# Patient Record
Sex: Female | Born: 1980
Health system: Southern US, Community
[De-identification: ages and names within clinical notes are randomized; demographics above are authoritative.]

## PROBLEM LIST (undated history)

## (undated) DIAGNOSIS — F3289 Other specified depressive episodes: Secondary | ICD-10-CM

## (undated) DIAGNOSIS — Q211 Atrial septal defect, unspecified: Secondary | ICD-10-CM

## (undated) DIAGNOSIS — Z9889 Other specified postprocedural states: Secondary | ICD-10-CM

## (undated) DIAGNOSIS — D649 Anemia, unspecified: Secondary | ICD-10-CM

## (undated) DIAGNOSIS — R112 Nausea with vomiting, unspecified: Secondary | ICD-10-CM

## (undated) DIAGNOSIS — R030 Elevated blood-pressure reading, without diagnosis of hypertension: Secondary | ICD-10-CM

## (undated) DIAGNOSIS — Z973 Presence of spectacles and contact lenses: Secondary | ICD-10-CM

## (undated) DIAGNOSIS — Z8489 Family history of other specified conditions: Secondary | ICD-10-CM

## (undated) DIAGNOSIS — N802 Endometriosis of fallopian tube: Secondary | ICD-10-CM

## (undated) DIAGNOSIS — K589 Irritable bowel syndrome without diarrhea: Secondary | ICD-10-CM

## (undated) DIAGNOSIS — R011 Cardiac murmur, unspecified: Secondary | ICD-10-CM

## (undated) DIAGNOSIS — Z9189 Other specified personal risk factors, not elsewhere classified: Secondary | ICD-10-CM

## (undated) DIAGNOSIS — N80209 Endometriosis of unspecified fallopian tube, unspecified depth: Secondary | ICD-10-CM

## (undated) DIAGNOSIS — Z5189 Encounter for other specified aftercare: Secondary | ICD-10-CM

## (undated) DIAGNOSIS — G002 Streptococcal meningitis: Secondary | ICD-10-CM

## (undated) DIAGNOSIS — F329 Major depressive disorder, single episode, unspecified: Secondary | ICD-10-CM

## (undated) HISTORY — DX: Encounter for other specified aftercare: Z51.89

## (undated) HISTORY — DX: Endometriosis of fallopian tube: N80.2

## (undated) HISTORY — DX: Cardiac murmur, unspecified: R01.1

## (undated) HISTORY — PX: SPINE SURGERY: SHX786

## (undated) HISTORY — PX: DILATION AND CURETTAGE OF UTERUS: SHX78

## (undated) HISTORY — PX: CORONARY ARTERY BYPASS GRAFT: SHX141

## (undated) HISTORY — DX: Major depressive disorder, single episode, unspecified: F32.9

## (undated) HISTORY — DX: Other specified depressive episodes: F32.89

## (undated) HISTORY — DX: Streptococcal meningitis: G00.2

## (undated) HISTORY — DX: Other specified personal risk factors, not elsewhere classified: Z91.89

## (undated) HISTORY — DX: Anemia, unspecified: D64.9

## (undated) HISTORY — DX: Endometriosis of unspecified fallopian tube, unspecified depth: N80.209

## (undated) HISTORY — DX: Elevated blood-pressure reading, without diagnosis of hypertension: R03.0

---
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
## 1999-10-14 HISTORY — PX: LAPAROSCOPIC ENDOMETRIOSIS FULGURATION: SUR769

## 2005-12-08 ENCOUNTER — Encounter: Payer: Self-pay | Admitting: Family Medicine

## 2005-12-08 ENCOUNTER — Ambulatory Visit: Payer: Self-pay | Admitting: Family Medicine

## 2006-12-07 ENCOUNTER — Ambulatory Visit: Payer: Self-pay | Admitting: Family Medicine

## 2007-03-29 ENCOUNTER — Emergency Department: Payer: Self-pay

## 2007-08-05 ENCOUNTER — Ambulatory Visit: Payer: Self-pay | Admitting: Internal Medicine

## 2008-03-09 ENCOUNTER — Ambulatory Visit: Payer: Self-pay | Admitting: Gynecology

## 2008-03-23 ENCOUNTER — Ambulatory Visit (HOSPITAL_COMMUNITY): Admission: RE | Admit: 2008-03-23 | Discharge: 2008-03-23 | Payer: Self-pay | Admitting: Gynecology

## 2008-03-28 ENCOUNTER — Ambulatory Visit: Payer: Self-pay | Admitting: Obstetrics and Gynecology

## 2008-03-28 ENCOUNTER — Encounter: Payer: Self-pay | Admitting: Obstetrics and Gynecology

## 2008-04-24 ENCOUNTER — Encounter: Payer: Self-pay | Admitting: Obstetrics & Gynecology

## 2008-04-24 ENCOUNTER — Ambulatory Visit (HOSPITAL_COMMUNITY): Admission: RE | Admit: 2008-04-24 | Discharge: 2008-04-24 | Payer: Self-pay | Admitting: Obstetrics & Gynecology

## 2008-04-24 ENCOUNTER — Ambulatory Visit: Payer: Self-pay | Admitting: Family Medicine

## 2008-04-24 ENCOUNTER — Ambulatory Visit: Payer: Self-pay | Admitting: Obstetrics & Gynecology

## 2010-02-08 ENCOUNTER — Emergency Department: Payer: Self-pay | Admitting: Emergency Medicine

## 2010-02-19 ENCOUNTER — Ambulatory Visit: Payer: Self-pay | Admitting: Obstetrics & Gynecology

## 2010-02-26 ENCOUNTER — Emergency Department (HOSPITAL_COMMUNITY): Admission: EM | Admit: 2010-02-26 | Discharge: 2010-02-26 | Payer: Self-pay | Admitting: Emergency Medicine

## 2010-02-26 ENCOUNTER — Ambulatory Visit (HOSPITAL_COMMUNITY): Admission: RE | Admit: 2010-02-26 | Discharge: 2010-02-26 | Payer: Self-pay | Admitting: Obstetrics & Gynecology

## 2010-03-05 ENCOUNTER — Ambulatory Visit: Payer: Self-pay | Admitting: Obstetrics & Gynecology

## 2010-03-05 ENCOUNTER — Encounter (INDEPENDENT_AMBULATORY_CARE_PROVIDER_SITE_OTHER): Payer: Self-pay | Admitting: *Deleted

## 2010-03-05 LAB — CONVERTED CEMR LAB: Prolactin: 10.1 ng/mL

## 2010-03-15 LAB — HM PAP SMEAR

## 2010-04-24 ENCOUNTER — Ambulatory Visit (HOSPITAL_COMMUNITY): Admission: RE | Admit: 2010-04-24 | Discharge: 2010-04-24 | Payer: Self-pay | Admitting: Gastroenterology

## 2010-06-30 ENCOUNTER — Ambulatory Visit
Admission: RE | Admit: 2010-06-30 | Discharge: 2010-06-30 | Payer: Self-pay | Source: Home / Self Care | Attending: Family Medicine | Admitting: Family Medicine

## 2010-06-30 DIAGNOSIS — N809 Endometriosis, unspecified: Secondary | ICD-10-CM | POA: Insufficient documentation

## 2010-06-30 DIAGNOSIS — G039 Meningitis, unspecified: Secondary | ICD-10-CM | POA: Insufficient documentation

## 2010-06-30 DIAGNOSIS — F325 Major depressive disorder, single episode, in full remission: Secondary | ICD-10-CM | POA: Insufficient documentation

## 2010-06-30 DIAGNOSIS — E538 Deficiency of other specified B group vitamins: Secondary | ICD-10-CM | POA: Insufficient documentation

## 2010-06-30 DIAGNOSIS — Z8619 Personal history of other infectious and parasitic diseases: Secondary | ICD-10-CM | POA: Insufficient documentation

## 2010-06-30 DIAGNOSIS — J020 Streptococcal pharyngitis: Secondary | ICD-10-CM | POA: Insufficient documentation

## 2010-06-30 DIAGNOSIS — Z862 Personal history of diseases of the blood and blood-forming organs and certain disorders involving the immune mechanism: Secondary | ICD-10-CM | POA: Insufficient documentation

## 2010-06-30 DIAGNOSIS — Z86010 Personal history of colonic polyps: Secondary | ICD-10-CM | POA: Insufficient documentation

## 2010-06-30 DIAGNOSIS — R011 Cardiac murmur, unspecified: Secondary | ICD-10-CM | POA: Insufficient documentation

## 2010-06-30 DIAGNOSIS — R87619 Unspecified abnormal cytological findings in specimens from cervix uteri: Secondary | ICD-10-CM | POA: Insufficient documentation

## 2010-06-30 LAB — CONVERTED CEMR LAB: Rapid Strep: POSITIVE

## 2010-07-17 NOTE — Assessment & Plan Note (Signed)
Summary: new patient/alc   Vital Signs:  Patient profile:   29 year old female Height:      64.5 inches Weight:      119.25 pounds BMI:     20.23 Temp:     98.8 degrees F oral Pulse rate:   88 / minute Pulse rhythm:   regular BP sitting:   110 / 70  Vitals Entered By: Benny Lennert CMA Duncan Dull) (June 30, 2010 10:06 AM)  History of Present Illness: Chief complaint new patient to be established also with sore throat  Previous PCP...Dr. Thurmond Butts at [facility]...no longer there now. HAs not been back since 2006.  [facility].    Treat at [facility].. for spinal meningitis age 53 months old.  Cardiologist: Dr. Eden Emms... Hx of ASD repair age 48. released to see regular MD in 2004. (Had treadmill stress test nml at that time) No problems since.  In 01/2010.. began with nausea, right abdominal pain...neg CT ab/pelvis...except cyst on ovary and ? spot on uterus. 02/2010 Transvaginal US .Marland Kitchen nml per GYN Continued symptoms so went to Dr. Loreta Ave. Neg celiac disease, nml thyroid.  EGD and colonoscopy.. polyps removed and precancerous 04/2010 HIDA scan: mildly decrease gallbladder function. No wondering if it is her endometriosis.Marland Kitchen symptoms are some better now. Notes pain before eating, although one episode prior to menses.   Acute Visit History:      The patient complains of fever and sore throat.  These symptoms began 6 days ago.  She denies cough, earache, headache, and nausea.  Other comments include: severe ST  Using Halls. .        Her highest temperature has been low grade .        Preventive Screening-Counseling & Management  Alcohol-Tobacco     Smoking Status: never  Caffeine-Diet-Exercise     Does Patient Exercise: yes      Drug Use:  no.    Allergies (verified): No Known Drug Allergies  Past History:  Past Medical History: Current Problems:  SPINAL MENINGITIS (ICD-320.2) ENDOMETRIOSIS OF FALLOPIAN TUBE (ICD-617.2) BLOOD TRANSFUSION WITHOUT REPORTED  DIAGNOSIS (ICD-V58.2) ELEVATED BLOOD PRESSURE WITHOUT DIAGNOSIS OF HYPERTENSION (ICD-796.2) CARDIAC MURMUR (ICD-785.2) DEPRESSION (ICD-311) CHICKENPOX, HX OF (ICD-V15.9) ANEMIA (ICD-285.9)    Past Surgical History: OPEN HEART SURGERY- ASD REPAIR [id number] LAPRASCOPY-08-1999 AND 05-2075for endometriosis  D/C: misscarriage ENDOSCOPY AND COLONOSCOPY 03/2010-06-1997 Dr. Loreta Ave  Family History: father: CAD age 12s, DM, HTN, high cholesterol mother deceased , non Hodgkin's lymphoma.. met to brain age 56 brother: sister: healthy  Social History: Occupation: Aeronautical engineer at [employer]  1 child: age 65 years old Married Never Smoked Alcohol use-yes, rare social Drug use-no Regular exercise-yes, softball, some walking Diet: moderate, some water, picky eater Occupation:  employed Smoking Status:  never Drug Use:  no Does Patient Exercise:  yes  Review of Systems General:  Denies fatigue. CV:  Denies chest pain or discomfort. Resp:  Denies shortness of breath. GI:  Complains of abdominal pain; denies bloody stools.  Physical Exam  General:  Well-developed,well-nourished,in no acute distress; alert,appropriate and cooperative throughout examination Eyes:  No corneal or conjunctival inflammation noted. EOMI. Perrla. Funduscopic exam benign, without hemorrhages, exudates or papilledema. Vision grossly normal. Ears:  External ear exam shows no significant lesions or deformities.  Otoscopic examination reveals clear canals, tympanic membranes are intact bilaterally without bulging, retraction, inflammation or discharge. Hearing is grossly normal bilaterally. Nose:  External nasal examination shows no deformity or inflammation. Nasal mucosa are pink and moist  without lesions or exudates. Mouth:  pharyngeal erythema and pharyngeal exudate.   Neck:  ant cervical lymph node swelling Lungs:  Normal respiratory effort, chest expands symmetrically. Lungs are clear to auscultation, no  crackles or wheezes. Heart:  Normal rate and regular rhythm. S1 and S2 normal without gallop, murmur, click, rub or other extra sounds. Abdomen:  ttp diffuse, no rebound, no guarding, soft, no hepatomegaly, and no splenomegaly.   Pulses:  R and L posterior tibial pulses are full and equal bilaterally  Extremities:  no edema    Impression & Recommendations:  Problem # 1:  STREPTOCOCCAL PHARYNGITIS (ICD-034.0) Assessment New  Her updated medication list for this problem includes:    Amoxicillin 500 Mg Caps (Amoxicillin) .Marland Kitchen... 2 tab by mouth two times a day x 10 days  Instructed to complete antibiotics and call if not improved in 48 hours.   Problem # 2:  ENDOMETRIOSIS (ICD-617.9) Assessment: Improved At this point most likely cause of abdominal pain. pt plans to follow up with GYN ASAP for abn pap review and further eval for endometriosis.   Complete Medication List: 1)  Amoxicillin 500 Mg Caps (Amoxicillin) .... 2 tab by mouth two times a day x 10 days  Patient Instructions: 1)  B12 1000 micrograms daily. 2)  Take your antibiotic as prescribed until ALL of it is gone, but stop if you develop a rash or swelling and contact our office as soon as possible.  3)  We will get records and review.. We will let you know if anything else needed.  4)  Have GYN send [place] the next OV note. 5)   Call if abdominal pain continuing.. and other MDs have not figured out what is going on. Prescriptions: AMOXICILLIN 500 MG CAPS (AMOXICILLIN) 2 tab by mouth two times a day x 10 days  #40 x 0   Entered and Authorized by:   Kerby Nora MD   Signed by:   Kerby Nora MD on 06/30/2010   Method used:   Print then Give to Patient   RxID:   [id number]    Orders Added: 1)  New Patient Level II [47425]    Prior Medications (reviewed today): None Current Allergies (reviewed today): No known allergies   Laboratory Results    Other Tests  Rapid Strep: positive  Kit Test Internal QC: Negative    (Normal Range: Negative)  PAP Result Date:  03/15/2010 PAP Result:  abnormal pap PAP Next Due:  3 mo

## 2010-08-13 ENCOUNTER — Encounter: Payer: BC Managed Care – PPO | Admitting: Obstetrics & Gynecology

## 2010-08-13 DIAGNOSIS — R87610 Atypical squamous cells of undetermined significance on cytologic smear of cervix (ASC-US): Secondary | ICD-10-CM

## 2010-08-17 ENCOUNTER — Emergency Department: Payer: Self-pay | Admitting: Emergency Medicine

## 2010-08-28 LAB — COMPREHENSIVE METABOLIC PANEL
Creatinine, Ser: 0.84 mg/dL (ref 0.4–1.2)
GFR calc Af Amer: >60 mL/min (ref >60)
GFR calc non Af Amer: >60 mL/min (ref >60)
Glucose, Bld: 84 mg/dL (ref 70–99)
Potassium: 3.6 mEq/L (ref 3.5–5.1)
Total Bilirubin: 0.8 mg/dL (ref 0.3–1.2)

## 2010-08-28 LAB — DIFFERENTIAL
Basophils Absolute: 0 10*3/uL (ref 0.0–0.1)
Eosinophils Relative: 0 % (ref 0–5)
Lymphocytes Relative: 48 % — ABNORMAL HIGH (ref 12–46)
Lymphs Abs: 2.2 10*3/uL (ref 0.7–4.0)
Monocytes Absolute: 0.4 10*3/uL (ref 0.1–1.0)

## 2010-08-28 LAB — URINE MICROSCOPIC-ADD ON

## 2010-08-28 LAB — URINALYSIS, ROUTINE W REFLEX MICROSCOPIC
Hgb urine dipstick: NEGATIVE
Ketones, ur: 15 mg/dL — AB
Leukocytes, UA: NEGATIVE
Nitrite: NEGATIVE
Protein, ur: 30 mg/dL — AB

## 2010-08-28 LAB — CBC
HCT: 33.7 % — ABNORMAL LOW (ref 36.0–46.0)
Hemoglobin: 11.5 g/dL — ABNORMAL LOW (ref 12.0–15.0)
Platelets: 187 10*3/uL (ref 150–400)
WBC: 4.5 10*3/uL (ref 4.0–10.5)

## 2010-08-28 LAB — LIPASE, BLOOD: Lipase: 27 U/L (ref 11–59)

## 2010-08-28 LAB — URINE CULTURE: Culture: NO GROWTH

## 2010-09-16 ENCOUNTER — Ambulatory Visit: Payer: BC Managed Care – PPO | Admitting: Obstetrics and Gynecology

## 2010-09-16 DIAGNOSIS — N949 Unspecified condition associated with female genital organs and menstrual cycle: Secondary | ICD-10-CM

## 2010-09-17 NOTE — Assessment & Plan Note (Signed)
NAME:  Deborah Navarro, Deborah Navarro NO.:  [id number]  MEDICAL RECORD NO.:  [id number]           PATIENT TYPE:  LOCATION:  [facility]           FACILITY:  PHYSICIAN:  Tinnie Gens, MD        DATE OF BIRTH:  12/26/1980  DATE OF SERVICE:  09/16/2010                                 CLINIC NOTE  CHIEF COMPLAINT:  Abdominal pain.  HISTORY OF PRESENT ILLNESS:  The patient is a 30 year old gravida 2, para 1-0-1-1 who comes in today for followup pelvic pain.  The patient has history of endometriosis diagnosed by laparoscopy in 2001.  She underwent presacral neurectomy at that time but no other real treatment. She had a baby in 2002 and she was on Tri-Sprintec for a long time.  She discontinued that in 2009 less than 2-1/2 years of her Tri-Sprintec, actively achieve pregnancy.  She did get pregnant and had miscarriage in November 2009.  She has not gotten pregnant again since then and is not using anything for birth control.  The patient reports mid abdominal pain on the right side off to the right of her umbilicus and somewhat inferior, not true pelvic pain.  She denies specific pain with intercourse but does feel like in certain positions she has painful intercourse and feels like her husband is hitting something.  She does have what she thinks as a incisional hernia on the side and has been feeling that for some time.  The patient has been seen in this office twice for the same problem and was referred to GI as well.  As per GI doctor, she underwent colonoscopy which revealed a precancerous polyp but otherwise they could not find a reason for her pain.  She has had pelvic sonography which was normal.  She has had a CT in [facility] which showed a 2-cm cyst on one of her ovaries which is likely to be a follicle.    Exam: Vitals: are as noted in the chart.  Gen: She is a thin female in no acute distress.   Abdomen: does not really have point tenderness anywhere.   She states  that there is swelling when she stands, so she was examined in the supine and upright position.  She does have what feels like a hernia but I do not feel any mass there.  There is definitely no tenderness there.  If there is any bowel there, it is easily reducible.   Pelvic: there is no significant uterine tenderness or abnormality.  Her ovaries are not well palpated, certainly not enlarged, and not really tender either.  IMPRESSION:  I am unclear as to the etiology of her pain.  I am willing to do a diagnostic laparoscopy and chromopertubation to see if we can figure out what this pain is, also see if there is evidence of a hernia laparoscopically for endometriosis and the scar.  I do not really think that is what this patient has given the lack of point tenderness in this area and lack of mass effect.  If all this comes back negative, would consider general surgery referral.  She is to get her hernia fixed.  She could have spigelian or different type of hernia, although this was  not commented on her CT from August of last year.  We will get this scheduled as soon as we can.          ______________________________ Tinnie Gens, MD    TP/MEDQ  D:  09/16/2010  T:  09/17/2010  Job:  [id number]

## 2010-10-27 ENCOUNTER — Other Ambulatory Visit: Payer: Self-pay | Admitting: Family Medicine

## 2010-10-27 ENCOUNTER — Encounter (HOSPITAL_COMMUNITY): Payer: BC Managed Care – PPO

## 2010-10-27 LAB — SURGICAL PCR SCREEN: Staphylococcus aureus: POSITIVE — AB

## 2010-10-27 LAB — CBC
MCH: 30 pg (ref 26.0–34.0)
MCHC: 33.1 g/dL (ref 30.0–36.0)
MCV: 90.5 fL (ref 78.0–100.0)

## 2010-10-28 NOTE — Assessment & Plan Note (Signed)
NAME:  Deborah Navarro, Deborah Navarro NO.:  [id number]   MEDICAL RECORD NO.:  [id number]          PATIENT TYPE:  POB   LOCATION:  [facility] at [facility]         FACILITY:  [facility]   PHYSICIAN:  Scheryl Darter, MD       DATE OF BIRTH:  [birth year]   DATE OF SERVICE:  03/05/2010                                  CLINIC NOTE   HISTORY OF PRESENT ILLNESS:  The patient returns today for followup for  abdominal and pelvic pain.  She is scheduled for yearly exam.  The  patient is a 30 year old white female, gravida 2, para 1-1-0-1, last  menstrual period February 23, 2010, who was seen on September 7, by Dr.  Macon Large.  She is having more abdominal pelvic pain, more on the right  side.  She had been on trice Sprintec, but she had been off oral  contraceptives for about 2 years.  The patient had a miscarriage in  November 2009.  She had a visit to [facility] for pain.  Dr. Macon Large, prescribed Percocet.  She says her pain was somewhat  better.  She notes that she also has chronic constipation and sometimes  will go for a week without normal bowel movement.  She also says she has  had decreased appetite for several weeks now.  No nausea and vomiting.   PAST MEDICAL HISTORY:  Endometriosis.   PAST SURGICAL HISTORY:  Laparoscopy in 2001 by Dr. Mia Creek.  Dr.  Mia Creek says this showed a great stage II endometriosis.  A month  later, did presacral neurectomy and uterine suspension procedure.  Suction, D and C in November 2009 for miscarriage.  Open-heart surgery  to repair ASD.   MEDICATIONS:  1. Percocet 5/325 mg 1-2 p.o. q.4-6 h. p.r.n. pain.  2. Reglan 10 mg 1 p.o. p.r.n.  3. Macrobid 100 mg 1 p.o. b.i.d. for 7 days which she was completed.   ALLERGIES:  NO KNOWN DRUG ALLERGIES.   SOCIAL HISTORY:  The patient is married.  She denies alcohol, tobacco,  or drug use.   FAMILY HISTORY:  Diabetes, hypertension, coronary artery disease,  stroke, and brain cancer in her  mother.   REVIEW OF SYSTEMS:  The patient had some recent weight loss.  She says  she has decreased appetite.  She has constipation as noted.  No allergy.  No fever.  Abdominal pain can be in the right lower quadrant or an upper  quadrant as well.  She denies any blood in her stool.  The patient has  bilateral nipple discharge since her last pregnancy.   PHYSICAL EXAMINATION:  GENERAL:  No acute distress.  Normal affect.  VITAL SIGNS:  Weight 121 pounds, blood pressure 119/66, and pulse 78.  BREASTS:  Symmetric.  No masses.  Small amount of slightly cloudy nipple  discharge is noted.  ABDOMEN:  Soft, nontender, nondistended.  No mass.  EXTREMITIES:  No swelling or deformity.  PELVIC/EXTERNAL GENITALIA:  Vagina and cervix normal.  Pap was done.  Uterus is normal size.  No adnexal masses or tenderness.   IMPRESSION:  The patient was diagnosed with endometriosis in the past.  This was not confirmed on pathology from this tissue specimen as  laparotomy.  She had chronic constipation.  She noticed some bilateral  nipple discharge.   PLAN:  She states that she is scheduled to see a gastroenterologist in  the next few weeks.  She should keep that appointment.  We will order a  prolactin level today because of the nipple discharge.  States that she  recently had a TSH level and try to get that report.  I notify her  results for Pap and her prolactin level.   RECOMMEND:  She will follow up here as needed after she sees a  gastroenterologist.      Scheryl Darter, MD     JA/MEDQ  D:  03/05/2010  T:  03/06/2010  Job:  [id number]

## 2010-10-28 NOTE — Assessment & Plan Note (Signed)
NAME:  Deborah Navarro, ROBERSON NO.:  [id number]   MEDICAL RECORD NO.:  [id number]          PATIENT TYPE:  POB   LOCATION:  [facility]         FACILITY:  [facility]   PHYSICIAN:  Jaynie Collins, MD     DATE OF BIRTH:  Apr 22, 1981   DATE OF SERVICE:  02/19/2010                                  CLINIC NOTE   CHIEF COMPLAINT:  Ovarian cyst and pelvic pain.   HISTORY OF PRESENT ILLNESS:  The patient is a 30 year old gravida 2,  para 2 who has a history of endometriosis and has been followed in this  clinic for several years who comes in today after she was seen at  [facility] on February 08, 2010, and diagnosed with  an ovarian cyst.  The patient is complaining of having more generalized  pain lately especially on the right side and is concerned that her  endometriosis is coming back.  She was on Tri-Sprintec for a long time,  but has discontinued this lately because she is trying to conceive  again.  At her visit at [facility], she was given ibuprofen for her pain,  but is saying that this is not helping with the amount of pain she is  having.  She is concerned that this ovarian cyst that was seen is  possible evidence of her endometriosis returning.  She denies any  abnormal bleeding or any other concerns.   Her records from [facility] showed that a CT scan  was done, which showed small cyst on bilateral ovaries, largest  measuring 2 cm in diameter and then around that area of heterogeneity in  the uterine fundus which measures 2.7 cm in diameter likely representing  a fibroid.  Normal appendix.   PHYSICAL EXAMINATION:  VITAL SIGNS:  Blood pressure is 123/81, pulse is  67, weight is 125 pounds.  GENERAL:  No apparent distress.  ABDOMEN:  Soft, the patient does have some tenderness in the right lower  quadrant and also right upper quadrant and right flank.  No abnormal  masses were palpated.  Small uterus which is mobile.  No  uterine  tenderness.  The patient had no rebound or guarding during examination.   The patient is a 30 year old gravida 2, para 2 here for evaluation of  pelvic pain and ovarian cyst that was seen on recent imaging.  Given  that a CT scan was done and there are no characteristics of the cyst  that were visualized.  We will obtain a pelvic ultrasound for further  characterization and follow up on the results.  Given her history of  endometriosis and uncontrolled pain at this point, she was given  Percocet prescription 60 tablets were given to the patient to use for  severe pain.  We will follow up her response.  She was told to follow up  after her ultrasound for further discussion of management of her  endometriosis and she was also told that she would have her annual  examination at the same time when she comes back to follow up the  results given that her last Pap smear was in October of 2009.  ______________________________  Jaynie Collins, MD     UA/MEDQ  D:  02/19/2010  T:  02/19/2010  Job:  [id number]

## 2010-10-28 NOTE — Assessment & Plan Note (Signed)
NAME:  Deborah Navarro, Deborah Navarro NO.:  [id number]   MEDICAL RECORD NO.:  [id number]          PATIENT TYPE:  POB   LOCATION:  [facility] at [facility]         FACILITY:  [facility]   PHYSICIAN:  Tinnie Gens, MD        DATE OF BIRTH:  [birth year]   DATE OF SERVICE:  12/07/2006                                  CLINIC NOTE   HISTORY OF PRESENT ILLNESS:  Ms. Hornbeck is a 30 year old white  female, gravida 1, para 1, who presents today for her annual exam.  She  has no complaints today. She is current taking Tri Sprintec and is happy  with that for now.  However, she does plan a pregnancy within this year  and wants to stay on this until they decide exactly when that will be.   PAST MEDICAL HISTORY:  Significant for endometriosis and ASD that was  repaired.   PAST SURGICAL HISTORY:  Significant for open heart surgery for ASD  repair at age 52.  She underwent laparoscopy, laparotomy, and uterine  suspension for endometriosis.   CURRENT MEDICATIONS:  Tri Sprintec.   No known allergies.   GYN HISTORY:  No history of STDs.  She does have a history of one  abnormal Pap with colposcopy and biopsy with all followups being  negative.  Menstrual cycles are every 28 days, lasting 3-4 days without  dysmenorrhea.   OBSTETRICAL HISTORY:  Gravida 1, para 1, with one spontaneous vaginal  delivery.  She does wish another pregnancy.   SOCIAL HISTORY:  Denies the use of tobacco, alcohol, or recreational  drugs.   FAMILY HISTORY:  Significant for strokes, coronary artery disease,  hypertension, diabetes, brain cancer.  Her mother died of brain cancer  at age 19.  Her father is undergoing evaluation now for skin cancer, as  is her sister.   REVIEW OF SYSTEMS:  A 14-point review of systems is negative.   She denies allergies to food or medicines.   The first day of her last period was November 23, 2006.   PHYSICAL EXAMINATION:  VITAL SIGNS:  Pulse 75, blood pressure 132/72,  weight 128.   Height 5 feet 4.  GENERAL:  She is a well-developed and well-nourished female in no acute  distress.  HEART:  Regular without murmur, gallop, or cardiac enlargement.  LUNGS:  Bilaterally clear.  NECK:  Supple without thyroid enlargement.  ABDOMEN:  Soft without masses or organomegaly.  Nontender.  PELVIC:  External genitalia within normal limits for female.  The vagina  is clean and rugose, and the cervix is parous and clean without  tenderness.  The uterus is small without tenderness.  Adnexa are  bilaterally clear.  BREASTS:  Without masses, nodes, or nipple discharge.  EXTREMITIES:  Within normal limits.   IMPRESSION:  Annual examination.   PLAN:  1. We will continue her Tri Sprintec until she is ready to conceive.      A prescription was written for one pack with p.r.n. refills.  2. She is to be on prenatal vitamins at the time she discontinues her      birth control pills.  3. A Pap smear was taken.  The results will be sent  to the patient.  4. She is to return to the office for prenatal care when indicated.     ______________________________  Matt Holmes, N.P.    ______________________________  Tinnie Gens, MD    EMK/MEDQ  D:  12/07/2006  T:  12/07/2006  Job:  [id number]

## 2010-10-28 NOTE — Op Note (Signed)
NAME:  Deborah Navarro, Deborah Navarro NO.:  [id number]   MEDICAL RECORD NO.:  [id number]         PATIENT TYPE:  WAMB   LOCATION:                                FACILITY:  [facility]   PHYSICIAN:  Allie Bossier, MD        DATE OF BIRTH:  [birth year]   DATE OF PROCEDURE:  DATE OF DISCHARGE:                               OPERATIVE REPORT   PREOPERATIVE DIAGNOSIS:  Missed abortion.   POSTOPERATIVE DIAGNOSIS:  Missed abortion.   PROCEDURE:  Section, D&C.   SURGEON:  Allie Bossier, MD   ANESTHESIA:  MAC.   COMPLICATIONS:  None.   ESTIMATED BLOOD LOSS:  Minimal.   SPECIMENS:  Uterine curettings.   DETAIL OF PROCEDURE AND FINDINGS:  The risks, benefits, alternatives of  surgery were explained, understood and accepted.  Consents were signed.  She declines watchful waiting and Cytotec at this time.  In the  operating room she was placed in dorsal lithotomy position and MAC  anesthesia was applied.  Her vagina was prepped and draped in usual  sterile fashion.  Bimanual exam revealed a 6-week size anteverted mobile  uterus and nonenlarged adnexa.  A speculum was placed.  The anterior lip  of the cervix was grasped with single-tooth tenaculum and a total of 20  mL of 1% lidocaine was used for paracervical block.  The cervix was  easily dilated with Shawnie Pons dilators to accommodate a #8 curved suction  curette.  Suction was applied.  The uterus was emptied of its contents.  Sharp curettage in all quadrants and the fundus of the uterus confirmed  complete emptying.  The tenaculum was removed.  No bleeding was noted  from the site.  She was taken to recovery room in stable condition with  the instrument, sponge, and needle counts correct.      Allie Bossier, MD  Electronically Signed     MCD/MEDQ  D:  04/24/2008  T:  04/25/2008  Job:  [id number]

## 2010-10-31 NOTE — Group Therapy Note (Signed)
NAME:  Deborah Navarro, LEMAR NO.:  [id number]   MEDICAL RECORD NO.:  [id number]          PATIENT TYPE:  POB   LOCATION:  [facility]                   FACILITY:  [facility]   PHYSICIAN:  Tinnie Gens, MD        DATE OF BIRTH:  14-Jan-1981   DATE OF SERVICE:  12/08/2005                                    CLINIC NOTE   CHIEF COMPLAINT:  A physical exam.   HISTORY OF PRESENT ILLNESS:  The patient is a 30 year old, gravida 1, para  1, who is here today for a yearly exam.  She has no complaints except for  vaginal discharge that is white with odor over the last month.  The patient  was not experiencing vaginal irritation and notes she has heavy periods at  times.  The patient is currently on Tri-Sprintec and that seems to be  working well for her.   PAST MEDICAL HISTORY:  Negative.   PAST SURGICAL HISTORY:  She had open heart surgery to repair ASD.  She  underwent laparoscopy and then a laparotomy and a sacral neurectomy and  uterine suspension for endometriosis.   MEDICATIONS:  She is on Tri-Sprintec daily.   ALLERGIES:  None known.   OBSTETRICAL HISTORY:  G1, P1 with one vaginal delivery.   GYN HISTORY:  No history of STD.  Does have history of one abnormal Pap and  colposcopy biopsy and then normal Paps for the last four and a half years.   SOCIAL HISTORY:  No tobacco, alcohol or drug use.   FAMILY HISTORY:  Significant for diabetes, hypertension, coronary artery  disease, stroke and brain cancer in her mother.  Mother is deceased of brain  cancer at age 39.   REVIEW OF SYSTEMS:  A 14-point review of systems is reviewed and is negative  except as in HPI with some vaginal discharge.  The patient does complain of  some shortness of breath and chest pain on occasions,  mostly several years  ago.  She does have a cardiologist who she sees and who follows her for  this.  She has undergone stress testing and the final results have been a  firm diagnosis for her.   PHYSICAL EXAMINATION:  VITAL SIGNS:  Her vitals are as noted on the chart.  GENERAL:  She is a well-developed, well-nourished female in no acute  distress.  LUNGS:  Clear bilaterally.  CARDIOVASCULAR:  Regular rate and rhythm.  No rubs, gallops, murmurs.  NECK:  Supple.  Normal thyroid.  ABDOMEN:  Soft, nontender, and nondistended.  No organomegaly.  EXTREMITIES:  No clubbing, cyanosis, or edema.  Had 2+ distal pulses.  GU:  Normal external female genitalia.  The vagina is pink and rugated.  The  cervix is parous and without lesions.  There is a white discharge noted.  BUS is normal.  Uterus is small and anteverted.  Adnexa without masses or  tenderness.  BREASTS:  Symmetric with red nipples.  There was no supraclavicular,  axillary adenopathy.  There was no breast masses.   IMPRESSION:  Yearly exam.   PLAN:  1.  Tri-Sprintec.  2.  GC and Chlamydia  testing.  3.  Wet prep sent.           ______________________________  Tinnie Gens, MD     TP/MEDQ  D:  12/08/2005  T:  12/08/2005  Job:  [id number]

## 2010-11-03 ENCOUNTER — Ambulatory Visit (HOSPITAL_COMMUNITY)
Admission: RE | Admit: 2010-11-03 | Discharge: 2010-11-03 | Disposition: A | Discharge status: Home / Self Care | Payer: BC Managed Care – PPO | Source: Ambulatory Visit | Attending: Family Medicine | Admitting: Family Medicine

## 2010-11-03 DIAGNOSIS — N979 Female infertility, unspecified: Secondary | ICD-10-CM | POA: Insufficient documentation

## 2010-11-03 DIAGNOSIS — Z01812 Encounter for preprocedural laboratory examination: Secondary | ICD-10-CM

## 2010-11-03 DIAGNOSIS — N949 Unspecified condition associated with female genital organs and menstrual cycle: Secondary | ICD-10-CM | POA: Insufficient documentation

## 2010-11-03 DIAGNOSIS — Z01818 Encounter for other preprocedural examination: Secondary | ICD-10-CM | POA: Insufficient documentation

## 2010-11-04 NOTE — Op Note (Signed)
NAME:  Deborah Navarro, Deborah Navarro NO.:  [id number]  MEDICAL RECORD NO.:  [id number]           PATIENT TYPE:  O  LOCATION:  [facility]                          [facility]  PHYSICIAN:  Sydnei Ohaver S. Shawnie Pons, M.D.   DATE OF BIRTH:  04/03/1981  DATE OF PROCEDURE: DATE OF DISCHARGE:                              OPERATIVE REPORT   PREOPERATIVE DIAGNOSIS:  Pelvic pain, infertility.  POSTOPERATIVE DIAGNOSIS:  Pelvic pain, infertility.  PROCEDURES:  Diagnostic laparoscopy, adhesiolysis, and chromopertubation.  SURGEON:  Shelbie Proctor. Shawnie Pons, MD  ASSISTANT:  None.  ANESTHESIA:  General and local, Dr. Arby Barrette  FINDINGS:  Permanent suture adhesions to the anterior abdominal wall related to previous uterine suspension.  A blocked left tube.  A small subcentimeter left ovarian cyst.  No evidence of endometriosis.  Normal- appearing appendix.  Normal-appearing liver edge.  Normal anterior and posterior cul-de-sacs.  Normal-appearing right tube and ovary.  Somewhat dilated left tube.  REASON FOR PROCEDURE:  Briefly, the patient is a 30 year old gravida 2, para 1-0-1-1 who had a miscarriage in November 2009 has not been able to get pregnant since then.  She also has chronic pelvic pain.  She has had a pelvic sonogram, CT of the abdomen and GI referral with colonoscopy which have all failed to determine exactly what was causing her abdominal pain.  Additionally, because she was not able to get pregnant, we are checking the patentcy of her tubes to rule out tubal issues as cause of  her infertility.  PROCEDURE IN DETAILS:  The patient was taken to the OR. She was placed in [place] stirrups.  A time-out was performed.  She was prepped and draped in usual sterile fashion.  SCDs were in place.  She received a gram of Ancef.  A red rubber catheter was used to drain her bladder.  A speculum was placed at the vagina.  The cervix was grasped anteriorly with single-tooth tenaculum.  Acorn cannula  placed through the cervix and this was attached to the single- tooth tenaculum.  The speculum was removed from the vagina.  Attention was then turned to the abdomen.  Marcaine 3 mL of 0.25 were injected about the umbilicus.  A vertical incision was made through the umbilicus to the underlying fascia.  Fascia was divided in the midline sharply. The rectus muscles were separated.  The peritoneum entered sharply; 2-0 Vicryl sutures on the UR-6 were then used to tag either side of the fascia at the umbilicus.  Hasson trocar was placed through this incision.  Upon entry into the abdomen, the patient was placed in Trendelenburg.  The uterus was visualized, lifted up.  The posterior and anterior cul-de-sacs were examined and were felt to be normal.  There was adhesions across a little bit of bowel which were taken down bluntly.  Then permanent suture was noted with scarring of the round ligaments up to the anterior abdominal wall from previous uterine suspension.  The permanent suture on the patient's right was seen easily.  On the left, a portion of it could be seen but it was not nearly as open as the right suture.  Both sides were taken  down without difficulty with monopolar scissors.  The suture was removed on both sides. The ovaries were inspected.  Tubes were inspected and felt to be normal.   Liver edge and appendix were visualized also felt to be normal.   A chromopertubation was then done with spillage from the right tube.   The left tube appeared to be blocked and somewhat dilated.  Excellent  hemostasis was noted. The camera was removed.  Pneumoperitoneum was allowed to be released. The Hasson trocar was removed from the  umbilical incision.  Fascial defect was closed with aforementioned 0 Vicryl sutures on the UR-6 and two figure-of-eight.  Skin was closed  in a subcuticular fashion followed by Dermabond.  All instrument  and lap counts were correct x2.  The patient was later taken to   recovery room in stable condition.     Shelbie Proctor. Shawnie Pons, M.D.     TSP/MEDQ  D:  11/03/2010  T:  11/04/2010  Job:  [id number]  Electronically Signed by Tinnie Gens M.D. on 11/04/2010 08:50:22 AM

## 2010-11-18 ENCOUNTER — Encounter: Payer: BC Managed Care – PPO | Admitting: Family Medicine

## 2010-12-09 ENCOUNTER — Encounter (INDEPENDENT_AMBULATORY_CARE_PROVIDER_SITE_OTHER): Payer: BC Managed Care – PPO | Admitting: Family Medicine

## 2010-12-09 DIAGNOSIS — Z09 Encounter for follow-up examination after completed treatment for conditions other than malignant neoplasm: Secondary | ICD-10-CM

## 2010-12-10 NOTE — Assessment & Plan Note (Signed)
NAME:  Deborah Navarro, CID NO.:  [id number]  MEDICAL RECORD NO.:  [id number]           PATIENT TYPE:  LOCATION:  [facility]           FACILITY:  PHYSICIAN:  Tinnie Gens, MD        DATE OF BIRTH:  [birth year]  DATE OF SERVICE:  12/09/2010                                 CLINIC NOTE  CHIEF COMPLAINT:  Postop followup.  HISTORY OF PRESENT ILLNESS:  The patient is a 29 year old, gravida 2, para 1-0-1-1 who underwent laparoscopy on Nov 03, 2010, for chronic right-sided pelvic pain.  At the time of surgery, she was found to have a lot of scarring with permanent suture tacking the round ligament up to the anterior abdominal wall.  Right was much greater than left and these sutures were cut and they were taken down.  Since surgery, she reports that her pain has been much improved on that side and in fact is nonexistent.  The patient underwent chromopertubation at that time given a history of endometriosis and her infertility.  The chromopertubation showed free spill from the right tube and no spill from the left.  She reports that when she had her miscarriage in 2009 they thought she had gotten pregnant from the left side, so that definitely appears different from today.  At this time, there was no active endometriosis that was seen at the time of her surgery and in fact she had a somewhat clean pelvis. The anterior and posterior cul-de-sacs were inspected and felt to be fairly normal with no active disease noted.  The ovaries also appeared normal.  At the time of surgery, these permanent sutures were cut and the round ligament and the uterus allowed to return to its normal position.    EXAM: she is healing well.  She had 1 umbilical incision which has healed up nicely.  There is a small ? Spigelian hernia on the left that is easily reducible.  IMPRESSION: 1. Postop followup, doing well. 2. Infertility. 3. Endometriosis history.  PLAN:  Discussed with the  patient at length the chances of fertility, what may be causing her infertility, and referral to a specialist.  The patient is unsure at this time, however, she will look into her benefits and see what they have and see if she would like to pursue this further. Also, she has regular cycles.  I do not think she is anovulatory. Anyway, she will call us back to let us know when she would like something else done or an appropriate referral.  She also has a small Spigelian hernia noted on the right and she may or may not need surgical referral with repair of this.  She has been watching it for 3 years and it seems to be okay to continue that as everything seems to be easily reducible at this point.  She will follow up as needed.          ______________________________ Tinnie Gens, MD    TP/MEDQ  D:  12/09/2010  T:  12/10/2010  Job:  [id number]

## 2011-02-18 ENCOUNTER — Telehealth: Payer: Self-pay | Admitting: *Deleted

## 2011-02-18 NOTE — Telephone Encounter (Signed)
It appears she has had extensive eval with Gi already.. Dr. Loreta Ave. Has she seen GYN about possible endometriosis?

## 2011-02-18 NOTE — Telephone Encounter (Signed)
Pt has had upper right abd pain for one year, getting worse.  She had seen you back in the winter for this.  Having nausea along with it, occasional fever- comes and goes.  She has had several tests and gallbladder disease was ruled out.  She is asking if she can be referred to a specialist to try and find out what is wrong.  She prefers to see someone in [place].

## 2011-02-19 NOTE — Telephone Encounter (Signed)
I think she needs to come back in for re-eval.. At this poitn I am unclear as to what is causing her pain. We can discuss possible referral as appropriate at that appt.   In meantime have her sign release of info to get records from GI and GYN if we do not already have.

## 2011-02-19 NOTE — Telephone Encounter (Signed)
Spoke with pt, she has seen a gyn, had laparoscopy, which didn't show anything.

## 2011-02-19 NOTE — Telephone Encounter (Signed)
Left message for patient to return my call.

## 2011-02-23 NOTE — Telephone Encounter (Signed)
Appointment made for 02-26-2011

## 2011-02-24 ENCOUNTER — Encounter: Payer: Self-pay | Admitting: Family Medicine

## 2011-02-26 ENCOUNTER — Ambulatory Visit (INDEPENDENT_AMBULATORY_CARE_PROVIDER_SITE_OTHER): Payer: BC Managed Care – PPO | Admitting: Family Medicine

## 2011-02-26 ENCOUNTER — Encounter: Payer: Self-pay | Admitting: Family Medicine

## 2011-02-26 VITALS — BP 120/78 | HR 64 | Temp 98.7°F | Ht 64.5 in | Wt 122.8 lb

## 2011-02-26 DIAGNOSIS — R21 Rash and other nonspecific skin eruption: Secondary | ICD-10-CM

## 2011-02-26 DIAGNOSIS — R1011 Right upper quadrant pain: Secondary | ICD-10-CM

## 2011-02-26 DIAGNOSIS — Z23 Encounter for immunization: Secondary | ICD-10-CM

## 2011-02-26 LAB — COMPREHENSIVE METABOLIC PANEL
ALT: 11 U/L (ref 0–35)
CO2: 28 mEq/L (ref 19–32)
Chloride: 103 mEq/L (ref 96–112)
Glucose, Bld: 85 mg/dL (ref 70–99)
Sodium: 139 mEq/L (ref 135–145)
Total Protein: 7.6 g/dL (ref 6.0–8.3)

## 2011-02-26 LAB — POCT URINALYSIS DIPSTICK
Bilirubin, UA: NEGATIVE
Blood, UA: NEGATIVE
Glucose, UA: NEGATIVE
Ketones, UA: NEGATIVE
Leukocytes, UA: NEGATIVE
Protein, UA: NEGATIVE
Spec Grav, UA: NEGATIVE

## 2011-02-26 MED ORDER — TRIAMCINOLONE ACETONIDE 0.5 % EX CREA: TOPICAL_CREAM | Freq: Two times a day (BID) | CUTANEOUS | Status: AC

## 2011-02-26 NOTE — Assessment & Plan Note (Signed)
Most consistent with contact derm. Treat with topical steroid.  Eval liver and kidney for other cause of itching.

## 2011-02-26 NOTE — Patient Instructions (Addendum)
We will call you with lab results. Stop at front desk to speak with Shirlee Limerick about non urgent referral to surgery for consult.  USe triamcinolone for rash on chest, call if not improving.

## 2011-02-26 NOTE — Progress Notes (Signed)
Subjective:    Patient ID: Deborah Navarro, female    DOB: May 05, 1981, 30 y.o.   MRN: [id number]  HPI  In 01/2010.. began with nausea, right upper abdominal pain...neg CT ab/pelvis...except cyst on ovary and ? spot on uterus.  02/2010 Transvaginal US .Marland Kitchen nml per GYN  Continued symptoms so went to Dr. Loreta Ave.  Neg celiac disease, nml thyroid.  EGD and colonoscopy 03/2010..   precancerous polyps removed, repeat recommended in 5 years. 04/2010 HIDA scan: normal gallbladder function (on report...nml but pt states she was told mildly decreased gallbladder function) Notes pain before eating, although one episode prior to menses.  Eval to determine if it was due her endometriosis.. Laproscopy in  10/2010: undid uterine tack, no endometriosis seen.  Pain in right abdomen comes and goes... Feels burning in right UQ after eating. Also has pain not related to eating, random. Ache noted with sitting, more pain when lying on left side. Associated with nausea, no vomiting. No blood in stool. No change in urine( has been trerated for UTI twice, but did not have symptoms) No flank pain. No current cough, no SOB.    Has noted rash on chest and ithcing in last few days. No new exposures.  Nonsmoker.  Review of Systems  Constitutional: Negative for fever, chills, fatigue and unexpected weight change.  HENT: Negative for ear pain.   Eyes: Negative for pain.  Respiratory: Negative for chest tightness and shortness of breath.   Cardiovascular: Negative for chest pain, palpitations and leg swelling.  Gastrointestinal: Negative for abdominal pain.  Genitourinary: Negative for dysuria.       Objective:   Physical Exam  Constitutional: Vital signs are normal. She appears well-developed and well-nourished. She is cooperative.  Non-toxic appearance. She does not appear ill. No distress.  HENT:  Head: Normocephalic.  Right Ear: Hearing, tympanic membrane, external ear and ear canal normal. Tympanic  membrane is not erythematous, not retracted and not bulging.  Left Ear: Hearing, tympanic membrane, external ear and ear canal normal. Tympanic membrane is not erythematous, not retracted and not bulging.  Nose: No mucosal edema or rhinorrhea. Right sinus exhibits no maxillary sinus tenderness and no frontal sinus tenderness. Left sinus exhibits no maxillary sinus tenderness and no frontal sinus tenderness.  Mouth/Throat: Uvula is midline, oropharynx is clear and moist and mucous membranes are normal.  Eyes: Conjunctivae, EOM and lids are normal. Pupils are equal, round, and reactive to light. No foreign bodies found.  Neck: Trachea normal and normal range of motion. Neck supple. Carotid bruit is not present. No mass and no thyromegaly present.  Cardiovascular: Normal rate, regular rhythm, S1 normal, S2 normal, normal heart sounds, intact distal pulses and normal pulses.  Exam reveals no gallop and no friction rub.   No murmur heard. Pulmonary/Chest: Effort normal and breath sounds normal. Not tachypneic. No respiratory distress. She has no decreased breath sounds. She has no wheezes. She has no rhonchi. She has no rales.  Abdominal: Soft. Normal appearance and bowel sounds are normal. There is no hepatosplenomegaly. There is tenderness in the right upper quadrant. There is no rigidity, no rebound, no guarding and no CVA tenderness. No hernia.  Neurological: She is alert.  Skin: Skin is warm, dry and intact. Rash noted.       Erythematous papules on right mid chest wall above breast tissue., small excoriations.  Psychiatric: Her speech is normal and behavior is normal. Judgment and thought content normal. Her mood appears not anxious. Cognition and memory  are normal. She does not exhibit a depressed mood.          Assessment & Plan:

## 2011-02-26 NOTE — Assessment & Plan Note (Signed)
Extensive GI eval and GYN eval no clear findings.  She is most concerned that the issue is from her gallbladder despite nml UA and nml HIDA. I will re-eval liver function and eval hapatitis panel as I cannot see that this was done. We will refer her to surgeon for consult but i have told her that I cannot see that they would recommend removal of gallbladder in her case.  She would prefer to discuss the case with them.  Total visit time 30 minutes, > 50% spent counseling and cordinating patients care.

## 2011-02-27 LAB — HEPATITIS PANEL, ACUTE
HCV Ab: NEGATIVE
Hep A IgM: NEGATIVE

## 2011-03-09 ENCOUNTER — Emergency Department: Payer: Self-pay | Admitting: Emergency Medicine

## 2011-03-13 ENCOUNTER — Other Ambulatory Visit: Payer: Self-pay | Admitting: General Surgery

## 2011-03-16 ENCOUNTER — Ambulatory Visit: Payer: Self-pay | Admitting: General Surgery

## 2011-03-17 ENCOUNTER — Ambulatory Visit: Payer: Self-pay | Admitting: General Surgery

## 2011-03-17 LAB — CBC
HCT: 33.8 — ABNORMAL LOW
Hemoglobin: 11.5 — ABNORMAL LOW
RBC: 3.68 — ABNORMAL LOW
RDW: 13.5
WBC: 6.8

## 2011-03-20 ENCOUNTER — Ambulatory Visit: Payer: Self-pay | Admitting: General Surgery

## 2011-04-09 ENCOUNTER — Ambulatory Visit: Payer: Self-pay | Admitting: General Surgery

## 2011-04-13 LAB — PATHOLOGY REPORT

## 2012-09-01 ENCOUNTER — Encounter: Payer: Self-pay | Admitting: Family Medicine

## 2012-09-01 ENCOUNTER — Ambulatory Visit (INDEPENDENT_AMBULATORY_CARE_PROVIDER_SITE_OTHER): Payer: BC Managed Care – PPO | Admitting: Family Medicine

## 2012-09-01 VITALS — BP 100/60 | HR 102 | Temp 99.9°F | Ht 64.5 in | Wt 128.2 lb

## 2012-09-01 DIAGNOSIS — R6889 Other general symptoms and signs: Secondary | ICD-10-CM

## 2012-09-01 DIAGNOSIS — J111 Influenza due to unidentified influenza virus with other respiratory manifestations: Secondary | ICD-10-CM

## 2012-09-01 MED ORDER — HYDROCODONE-HOMATROPINE 5-1.5 MG/5ML PO SYRP: ORAL_SOLUTION | ORAL | Status: DC

## 2012-09-01 NOTE — Progress Notes (Signed)
Nature conservation officer at [facility] [address] [place] [address] Phone: [phone] Fax: [phone]  Date:  09/01/2012   Name:  Deborah Navarro   DOB:  Jul 19, 1980   MRN:  [id number] Gender: female Age: 32 y.o.  Primary Physician:  Kerby Nora, MD  Evaluating MD: Hannah Beat, MD   Chief Complaint: Sore Throat, Cough, Fever and Nasal Congestion   History of Present Illness:  Deborah Navarro is a 32 y.o. 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  Fever to 99.9, coughing and nasal congestion with some aching. No ear ache, no significant sore throat. Some ear fullness. No n/v/d. Eating and drinking OK.  Patient Active Problem List  Diagnosis  . COLONIC POLYPS  . B12 DEFICIENCY  . ANEMIA  . DEPRESSION  . Hx of MENINGITIS  . ENDOMETRIOSIS  . CARDIAC MURMUR  . PAP SMEAR, ABNORMAL  . CHICKENPOX, HX OF  . Right upper quadrant pain, chronic  . Rash    Past Medical History  Diagnosis Date  . Streptococcal meningitis   . Endometriosis of fallopian tube   . Blood transfusion, without reported diagnosis   . Elevated blood pressure reading without diagnosis of hypertension   . Undiagnosed cardiac murmurs   . Depressive disorder, not elsewhere classified   . Unspecified personal history presenting hazards to health   . Anemia, unspecified     Past Surgical History  Procedure Laterality Date  . Open heart surgery  12-1994    asd repair  . Dilation and curettage of uterus    . Laparoscopic endometriosis fulguration  10-1999    endometriosis    History   Social History  . Marital Status: Married    Spouse Name: N/A    Number of Children: 1  . 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 Smoking status: Never  Smoker   . Smokeless tobacco: Not on file  . Alcohol Use: Yes  . Drug Use: No  . Sexually Active: Not on file   Other Topics Concern  . Not on file   Social History Narrative   Regular exercise--yes softball, some walking   Diet: Moderate, some water, picky eater             Family History  Problem Relation Age of Onset  . Cancer Mother 39    non hodgkins lymphoma(mets to brain)  . Coronary artery disease Father   . Hypertension Father   . Hyperlipidemia Father   . Diabetes Father     No Known Allergies  Medication list has been reviewed and updated.  No outpatient prescriptions prior to visit.   No facility-administered medications prior to visit.    Review of Systems:  ROS: GEN: Acute illness details above GI: Tolerating PO intake GU: maintaining adequate hydration and urination Pulm: No SOB Interactive and getting along well at home.  Otherwise, ROS is as per the HPI.   Physical Examination: BP 100/60  Pulse 102  Temp(Src) 99.9 F (37.7 C) (Oral)  Ht 5' 4.5" (1.638 m)  Wt 128 lb 4 oz (58.174 kg)  BMI 21.68 kg/m2  SpO2 97%  Ideal Body Weight: Weight in (lb) to have BMI = 25: 147.6   Gen: WDWN, NAD; A &  O x3, cooperative. Pleasant.Globally Non-toxic HEENT: Normocephalic and atraumatic. Throat clear, w/o exudate, R TM clear, L TM - good landmarks, No fluid present. Minimal rhinnorhea. No frontal or maxillary sinus T. MMM NECK: Anterior cervical  LAD is absent. CV: RRR, No M/G/R, cap refill <2 sec PULM: Breathing comfortably in no respiratory distress. no wheezing, crackles, rhonchi ABD: S,NT,ND,+BS. No HSM. No rebound. EXT: No c/c/e PSYCH: Friendly, good eye contact MSK: Nml gait  Assessment and Plan:  Flu-like symptoms   Supportive care, fluids, reassurance  Orders Today:  No orders of the defined types were placed in this encounter.    Updated Medication List: (Includes new medications, updates to list, dose adjustments) Meds ordered  this encounter  Medications  . HYDROcodone-homatropine (HYCODAN) 5-1.5 MG/5ML syrup    Sig: 1 tsp po at night before bed prn cough    Dispense:  240 mL    Refill:  0    Medications Discontinued: There are no discontinued medications.   Signed, Elpidio Galea. Evey Mcmahan, MD 09/01/2012 4:07 PM

## 2014-02-28 ENCOUNTER — Encounter (HOSPITAL_COMMUNITY): Payer: Self-pay | Admitting: Emergency Medicine

## 2014-02-28 ENCOUNTER — Emergency Department (HOSPITAL_COMMUNITY)
Admission: EM | Admit: 2014-02-28 | Discharge: 2014-03-01 | Disposition: A | Discharge status: Home / Self Care | Payer: Self-pay | Attending: Emergency Medicine | Admitting: Emergency Medicine

## 2014-02-28 ENCOUNTER — Emergency Department (HOSPITAL_COMMUNITY): Payer: Self-pay

## 2014-02-28 DIAGNOSIS — Z862 Personal history of diseases of the blood and blood-forming organs and certain disorders involving the immune mechanism: Secondary | ICD-10-CM | POA: Insufficient documentation

## 2014-02-28 DIAGNOSIS — Z9189 Other specified personal risk factors, not elsewhere classified: Secondary | ICD-10-CM | POA: Insufficient documentation

## 2014-02-28 DIAGNOSIS — Z3202 Encounter for pregnancy test, result negative: Secondary | ICD-10-CM | POA: Insufficient documentation

## 2014-02-28 DIAGNOSIS — R5383 Other fatigue: Principal | ICD-10-CM

## 2014-02-28 DIAGNOSIS — R42 Dizziness and giddiness: Secondary | ICD-10-CM | POA: Insufficient documentation

## 2014-02-28 DIAGNOSIS — Z8659 Personal history of other mental and behavioral disorders: Secondary | ICD-10-CM | POA: Insufficient documentation

## 2014-02-28 DIAGNOSIS — Z8669 Personal history of other diseases of the nervous system and sense organs: Secondary | ICD-10-CM | POA: Insufficient documentation

## 2014-02-28 DIAGNOSIS — R5381 Other malaise: Secondary | ICD-10-CM | POA: Insufficient documentation

## 2014-02-28 DIAGNOSIS — Z8742 Personal history of other diseases of the female genital tract: Secondary | ICD-10-CM | POA: Insufficient documentation

## 2014-02-28 DIAGNOSIS — R531 Weakness: Secondary | ICD-10-CM

## 2014-02-28 DIAGNOSIS — R011 Cardiac murmur, unspecified: Secondary | ICD-10-CM | POA: Insufficient documentation

## 2014-02-28 DIAGNOSIS — R0789 Other chest pain: Secondary | ICD-10-CM | POA: Insufficient documentation

## 2014-02-28 LAB — CBC
HEMATOCRIT: 31.2 % — AB (ref 36.0–46.0)
Hemoglobin: 10.6 g/dL — ABNORMAL LOW (ref 12.0–15.0)
MCH: 29.6 pg (ref 26.0–34.0)
MCHC: 34 g/dL (ref 30.0–36.0)
MCV: 87.2 fL (ref 78.0–100.0)
PLATELETS: 215 10*3/uL (ref 150–400)
RBC: 3.58 MIL/uL — AB (ref 3.87–5.11)
RDW: 12.7 % (ref 11.5–15.5)
WBC: 4.2 10*3/uL (ref 4.0–10.5)

## 2014-02-28 LAB — BASIC METABOLIC PANEL
ANION GAP: 12 (ref 5–15)
BUN: 9 mg/dL (ref 6–23)
CALCIUM: 9 mg/dL (ref 8.4–10.5)
CO2: 27 mEq/L (ref 19–32)
Chloride: 101 mEq/L (ref 96–112)
Creatinine, Ser: 0.9 mg/dL (ref 0.50–1.10)
GFR calc Af Amer: >90 mL/min (ref >90)
GFR calc non Af Amer: 82 mL/min — ABNORMAL LOW (ref >90)
Glucose, Bld: 93 mg/dL (ref 70–99)
Potassium: 4.1 mEq/L (ref 3.7–5.3)
Sodium: 140 mEq/L (ref 137–147)

## 2014-02-28 LAB — POC URINE PREG, ED: PREG TEST UR: NEGATIVE

## 2014-02-28 LAB — I-STAT TROPONIN, ED
Troponin i, poc: 0 ng/mL (ref 0.00–0.08)
Troponin i, poc: 0 ng/mL (ref 0.00–0.08)

## 2014-02-28 LAB — URINALYSIS, ROUTINE W REFLEX MICROSCOPIC
Bilirubin Urine: NEGATIVE
Glucose, UA: NEGATIVE mg/dL
HGB URINE DIPSTICK: NEGATIVE
Ketones, ur: NEGATIVE mg/dL
Leukocytes, UA: NEGATIVE
Nitrite: NEGATIVE
PROTEIN: NEGATIVE mg/dL
SPECIFIC GRAVITY, URINE: 1.014 (ref 1.005–1.030)
Urobilinogen, UA: 0.2 mg/dL (ref 0.0–1.0)
pH: 6.5 (ref 5.0–8.0)

## 2014-02-28 MED ORDER — ONDANSETRON HCL 4 MG/2ML IJ SOLN
4.0000 mg | Freq: Once | INTRAMUSCULAR | Status: AC
  Administered 2014-03-01: 4 mg via INTRAVENOUS
  Filled 2014-02-28: qty 2

## 2014-02-28 MED ORDER — MORPHINE SULFATE 4 MG/ML IJ SOLN
4.0000 mg | Freq: Once | INTRAMUSCULAR | Status: AC
  Administered 2014-03-01: 4 mg via INTRAVENOUS
  Filled 2014-02-28: qty 1

## 2014-02-28 MED ORDER — SODIUM CHLORIDE 0.9 % IV BOLUS (SEPSIS)
1000.0000 mL | Freq: Once | INTRAVENOUS | Status: AC
Start: 2014-02-28 — End: 2014-02-28
  Administered 2014-02-28: 1000 mL via INTRAVENOUS

## 2014-02-28 NOTE — ED Provider Notes (Signed)
CSN: [id number]     Arrival date & time 02/28/14  1652 History   First MD Initiated Contact with Patient 02/28/14 2028     Chief Complaint  Patient presents with  . Fatigue  . Weakness     (Consider location/radiation/quality/duration/timing/severity/associated sxs/prior Treatment) The history is provided by the patient and the spouse.    Patient states she was cleaning a bathroom with bleach, windex, and soft scrub when she suddenly felt lightheaded, dizzy (spinning), and with generalized weakness.  Had palpitations at the time, described as feeling her heart beat in her head.  Also had increase in the pain she has had for the past several weeks - right flank pain, pain is achy and sharp, ongoing off and on for 4 weeks.  States that since she had open heart surgery for ASD repair she has chest pain whenever she has pain in her back or abdomen and she did have some deep central pain at the time.  States she has had increased cough because she has been working more recently and has been exposed to more cleaning products.  Denies fevers, recent illness, SOB, leg swelling, urinary or vaginal symptoms.   Has had more BMs than usual over the past few weeks, but may carry a diagnosis of IBS.      Past Medical History  Diagnosis Date  . Streptococcal meningitis   . Endometriosis of fallopian tube   . Blood transfusion, without reported diagnosis   . Elevated blood pressure reading without diagnosis of hypertension   . Undiagnosed cardiac murmurs   . Depressive disorder, not elsewhere classified   . Unspecified personal history presenting hazards to health   . Anemia, unspecified    Past Surgical History  Procedure Laterality Date  . Open heart surgery  12-1994    asd repair  . Dilation and curettage of uterus    . Laparoscopic endometriosis fulguration  10-1999    endometriosis   Family History  Problem Relation Age of Onset  . Cancer Mother 36    non hodgkins lymphoma(mets to brain)  .  Coronary artery disease Father   . Hypertension Father   . Hyperlipidemia Father   . Diabetes Father    History  Substance Use Topics  . Smoking status: Never Smoker   . Smokeless tobacco: Not on file  . Alcohol Use: Yes   OB History   Grav Para Term Preterm Abortions TAB SAB Ect Mult Living                 Review of Systems  All other systems reviewed and are negative.     Allergies  Review of patient's allergies indicates no known allergies.  Home Medications   Prior to Admission medications   Not on File   BP 124/57  Pulse 61  Temp(Src) 98.4 F (36.9 C) (Oral)  Resp 18  SpO2 100%  LMP 02/23/2014 Physical Exam  Nursing note and vitals reviewed. Constitutional: She appears well-developed and well-nourished. No distress.  HENT:  Head: Normocephalic and atraumatic.  Neck: Neck supple.  Cardiovascular: Normal rate and regular rhythm.   Pulmonary/Chest: Effort normal and breath sounds normal. No respiratory distress. She has no wheezes. She has no rales.  Abdominal: Soft. She exhibits no distension. There is no tenderness. There is no rebound and no guarding.  Neurological: She is alert.  CN II-XII intact, EOMs intact, no pronator drift, grip strengths equal bilaterally; strength 5/5 in all extremities, sensation intact in all extremities; finger  to nose, heel to shin, rapid alternating movements normal; gait is normal.     Skin: She is not diaphoretic.    ED Course  Procedures (including critical care time) Labs Review Labs Reviewed  CBC - Abnormal; Notable for the following:    RBC 3.58 (*)    Hemoglobin 10.6 (*)    HCT 31.2 (*)    All other components within normal limits  BASIC METABOLIC PANEL - Abnormal; Notable for the following:    GFR calc non Af Amer 82 (*)    All other components within normal limits  URINALYSIS, ROUTINE W REFLEX MICROSCOPIC - Abnormal; Notable for the following:    APPearance CLOUDY (*)    All other components within normal  limits  I-STAT TROPOININ, ED  POC URINE PREG, ED  Deborah Navarro, ED    Imaging Review Dg Chest 2 View  02/28/2014   CLINICAL DATA:  Fatigue and weakness.  EXAM: CHEST  2 VIEW  COMPARISON:  None.  FINDINGS: The lungs are well-aerated and clear. There is no evidence of focal opacification, pleural effusion or pneumothorax.  The heart is normal in size; the patient is status post median sternotomy. No acute osseous abnormalities are seen.  IMPRESSION: No acute cardiopulmonary process seen.   Electronically Signed   By: Roanna Raider M.D.   On: 02/28/2014 23:40     EKG Interpretation None       Date: 03/01/2014  Rate: 65  Rhythm: Unusual P axis, possible ectopic atrial rhythm  QRS Axis: normal  Intervals: normal  ST/T Wave abnormalities: normal  Conduction Disutrbances:none  Narrative Interpretation:   Old EKG Reviewed: none available    12:06 AM Discussed pt with Dr Mora Bellman.  Reviewed EKG with Dr Mora Bellman.    MDM   Final diagnoses:  Lightheadedness  Generalized weakness  Atypical chest pain    Afebrile, nontoxic patient with lightheadedness, dizziness, generalized weakness that occurred while cleaning a bathroom today.  Pt had associated increased in her right flank pain that she has had for several weeks, also had some chest pressure - which she states she has whenever she has pain in another part of her body.  Pt given IVF,  Pain medication. CBC shows slightly worsening anemia, Hgb 10.6, pt denies any sources of bleeding including hematochezia, hematuria, melena.  Troponin x 2 negative.  CXR negative.  UA, urine preg negative.  EKG shows atrial abnormality that is likely related to her remote ASD repair.   D/C home with cardiology and PCP follow up, norco.   Discussed result, findings, treatment, and follow up  with patient.  Pt given return precautions.  Pt verbalizes understanding and agrees with plan.      TIMI score 0 HEART score 0-1    Trixie Dredge, PA-C 03/01/14 0116

## 2014-02-28 NOTE — ED Notes (Signed)
Returned from xray

## 2014-02-28 NOTE — ED Notes (Signed)
Pt reports being at work today and at approx 1:30 onset of weakness, fatigue, feeling lightheaded.

## 2014-03-01 MED ORDER — HYDROCODONE-ACETAMINOPHEN 5-325 MG PO TABS: 1.0000 | ORAL_TABLET | ORAL | Status: DC | PRN

## 2014-03-01 MED ORDER — ALBUTEROL SULFATE HFA 108 (90 BASE) MCG/ACT IN AERS
2.0000 | INHALATION_SPRAY | Freq: Once | RESPIRATORY_TRACT | Status: AC
  Administered 2014-03-01: 2 via RESPIRATORY_TRACT
  Filled 2014-03-01: qty 6.7

## 2014-03-01 NOTE — Discharge Instructions (Signed)
Read the information below.  Use the prescribed medication as directed.  Please discuss all new medications with your pharmacist.  Do not take additional tylenol while taking the prescribed pain medication to avoid overdose.  You may return to the Emergency Department at any time for worsening condition or any new symptoms that concern you.  If you develop worsening chest pain, shortness of breath, fever, you pass out, or become weak or dizzy, return to the ER for a recheck.   If you develop high fevers, worsening abdominal pain, uncontrolled vomiting, or are unable to tolerate fluids by mouth, return to the ER for a recheck.    Your caregiver has seen you today because you are having problems with feelings of weakness, dizziness, and/or fatigue. Weakness has many different causes, some of which are common and others are very rare. Your caregiver has considered some of the most common causes of weakness and feels it is safe for you to go home and be observed. Not every illness or injury can be identified during an emergency department visit, thus follow-up with your primary healthcare provider is important. Medical conditions can also worsen, so it is also important to return immediately as directed below, or if you have other serious concerns develop. RETURN IMMEDIATELY IF you develop new shortness of breath, chest pain, fever, have difficulty moving parts of your body (new weakness, numbness, or incoordination), sudden change in speech, vision, swallowing, or understanding, faint or develop new dizziness, severe headache, become poorly responsive or have an altered mental status compared to baseline for you, new rash, abdominal pain, or bloody stools,  Return sooner also if you develop new problems for which you have not talked to your caregiver but you feel may be emergency medical conditions, or are unable to be cared for safely at home.   Chest Pain (Nonspecific) It is often hard to give a specific  diagnosis for the cause of chest pain. There is always a chance that your pain could be related to something serious, such as a heart attack or a blood clot in the lungs. You need to follow up with your health care provider for further evaluation. CAUSES   Heartburn.  Pneumonia or bronchitis.  Anxiety or stress.  Inflammation around your heart (pericarditis) or lung (pleuritis or pleurisy).  A blood clot in the lung.  A collapsed lung (pneumothorax). It can develop suddenly on its own (spontaneous pneumothorax) or from trauma to the chest.  Shingles infection (herpes zoster virus). The chest wall is composed of bones, muscles, and cartilage. Any of these can be the source of the pain.  The bones can be bruised by injury.  The muscles or cartilage can be strained by coughing or overwork.  The cartilage can be affected by inflammation and become sore (costochondritis). DIAGNOSIS  Lab tests or other studies may be needed to find the cause of your pain. Your health care provider may have you take a test called an ambulatory electrocardiogram (ECG). An ECG records your heartbeat patterns over a 24-hour period. You may also have other tests, such as:  Transthoracic echocardiogram (TTE). During echocardiography, sound waves are used to evaluate how blood flows through your heart.  Transesophageal echocardiogram (TEE).  Cardiac monitoring. This allows your health care provider to monitor your heart rate and rhythm in real time.  Holter monitor. This is a portable device that records your heartbeat and can help diagnose heart arrhythmias. It allows your health care provider to track your heart activity  for several days, if needed.  Stress tests by exercise or by giving medicine that makes the heart beat faster. TREATMENT   Treatment depends on what may be causing your chest pain. Treatment may include:  Acid blockers for heartburn.  Anti-inflammatory medicine.  Pain medicine for  inflammatory conditions.  Antibiotics if an infection is present.  You may be advised to change lifestyle habits. This includes stopping smoking and avoiding alcohol, caffeine, and chocolate.  You may be advised to keep your head raised (elevated) when sleeping. This reduces the chance of acid going backward from your stomach into your esophagus. Most of the time, nonspecific chest pain will improve within 2-3 days with rest and mild pain medicine.  HOME CARE INSTRUCTIONS   If antibiotics were prescribed, take them as directed. Finish them even if you start to feel better.  For the next few days, avoid physical activities that bring on chest pain. Continue physical activities as directed.  Do not use any tobacco products, including cigarettes, chewing tobacco, or electronic cigarettes.  Avoid drinking alcohol.  Only take medicine as directed by your health care provider.  Follow your health care provider's suggestions for further testing if your chest pain does not go away.  Keep any follow-up appointments you made. If you do not go to an appointment, you could develop lasting (chronic) problems with pain. If there is any problem keeping an appointment, call to reschedule. SEEK MEDICAL CARE IF:   Your chest pain does not go away, even after treatment.  You have a rash with blisters on your chest.  You have a fever. SEEK IMMEDIATE MEDICAL CARE IF:   You have increased chest pain or pain that spreads to your arm, neck, jaw, back, or abdomen.  You have shortness of breath.  You have an increasing cough, or you cough up blood.  You have severe back or abdominal pain.  You feel nauseous or vomit.  You have severe weakness.  You faint.  You have chills. This is an emergency. Do not wait to see if the pain will go away. Get medical help at once. Call your local emergency services (911 in [place] Do not drive yourself to the hospital. MAKE SURE YOU:   Understand these  instructions.  Will watch your condition.  Will get help right away if you are not doing well or get worse. Document Released: 03/11/2005 Document Revised: 06/06/2013 Document Reviewed: 01/05/2008 [facility] Patient Information 2015 [place], [place]. This information is not intended to replace advice given to you by your health care provider. Make sure you discuss any questions you have with your health care provider.  Dizziness Dizziness is a common problem. It is a feeling of unsteadiness or light-headedness. You may feel like you are about to faint. Dizziness can lead to injury if you stumble or fall. A person of any age group can suffer from dizziness, but dizziness is more common in older adults. CAUSES  Dizziness can be caused by many different things, including:  Middle ear problems.  Standing for too long.  Infections.  An allergic reaction.  Aging.  An emotional response to something, such as the sight of blood.  Side effects of medicines.  Tiredness.  Problems with circulation or blood pressure.  Excessive use of alcohol or medicines, or illegal drug use.  Breathing too fast (hyperventilation).  An irregular heart rhythm (arrhythmia).  A low red blood cell count (anemia).  Pregnancy.  Vomiting, diarrhea, fever, or other illnesses that cause body fluid  loss (dehydration).  Diseases or conditions such as Parkinson's disease, high blood pressure (hypertension), diabetes, and thyroid problems.  Exposure to extreme heat. DIAGNOSIS  Your health care provider will ask about your symptoms, perform a physical exam, and perform an electrocardiogram (ECG) to record the electrical activity of your heart. Your health care provider may also perform other heart or blood tests to determine the cause of your dizziness. These may include:  Transthoracic echocardiogram (TTE). During echocardiography, sound waves are used to evaluate how blood flows through your  heart.  Transesophageal echocardiogram (TEE).  Cardiac monitoring. This allows your health care provider to monitor your heart rate and rhythm in real time.  Holter monitor. This is a portable device that records your heartbeat and can help diagnose heart arrhythmias. It allows your health care provider to track your heart activity for several days if needed.  Stress tests by exercise or by giving medicine that makes the heart beat faster. TREATMENT  Treatment of dizziness depends on the cause of your symptoms and can vary greatly. HOME CARE INSTRUCTIONS   Drink enough fluids to keep your urine clear or pale yellow. This is especially important in very hot weather. In older adults, it is also important in cold weather.  Take your medicine exactly as directed if your dizziness is caused by medicines. When taking blood pressure medicines, it is especially important to get up slowly.  Rise slowly from chairs and steady yourself until you feel okay.  In the morning, first sit up on the side of the bed. When you feel okay, stand slowly while holding onto something until you know your balance is fine.  Move your legs often if you need to stand in one place for a long time. Tighten and relax your muscles in your legs while standing.  Have someone stay with you for 1-2 days if dizziness continues to be a problem. Do this until you feel you are well enough to stay alone. Have the person call your health care provider if he or she notices changes in you that are concerning.  Do not drive or use heavy machinery if you feel dizzy.  Do not drink alcohol. SEEK IMMEDIATE MEDICAL CARE IF:   Your dizziness or light-headedness gets worse.  You feel nauseous or vomit.  You have problems talking, walking, or using your arms, hands, or legs.  You feel weak.  You are not thinking clearly or you have trouble forming sentences. It may take a friend or family member to notice this.  You have chest  pain, abdominal pain, shortness of breath, or sweating.  Your vision changes.  You notice any bleeding.  You have side effects from medicine that seems to be getting worse rather than better. MAKE SURE YOU:   Understand these instructions.  Will watch your condition.  Will get help right away if you are not doing well or get worse. Document Released: 11/25/2000 Document Revised: 06/06/2013 Document Reviewed: 12/19/2010 [facility] Patient Information 2015 [place], [place]. This information is not intended to replace advice given to you by your health care provider. Make sure you discuss any questions you have with your health care provider.  Weakness Weakness is a lack of strength. It may be felt all over the body (generalized) or in one specific part of the body (focal). Some causes of weakness can be serious. You may need further medical evaluation, especially if you are elderly or you have a history of immunosuppression (such as chemotherapy or HIV), kidney  disease, heart disease, or diabetes. CAUSES  Weakness can be caused by many different things, including:  Infection.  Physical exhaustion.  Internal bleeding or other blood loss that results in a lack of red blood cells (anemia).  Dehydration. This cause is more common in elderly people.  Side effects or electrolyte abnormalities from medicines, such as pain medicines or sedatives.  Emotional distress, anxiety, or depression.  Circulation problems, especially severe peripheral arterial disease.  Heart disease, such as rapid atrial fibrillation, bradycardia, or heart failure.  Nervous system disorders, such as Guillain-Barr syndrome, multiple sclerosis, or stroke. DIAGNOSIS  To find the cause of your weakness, your caregiver will take your history and perform a physical exam. Lab tests or X-rays may also be ordered, if needed. TREATMENT  Treatment of weakness depends on the cause of your symptoms and can vary  greatly. HOME CARE INSTRUCTIONS   Rest as needed.  Eat a well-balanced diet.  Try to get some exercise every day.  Only take over-the-counter or prescription medicines as directed by your caregiver. SEEK MEDICAL CARE IF:   Your weakness seems to be getting worse or spreads to other parts of your body.  You develop new aches or pains. SEEK IMMEDIATE MEDICAL CARE IF:   You cannot perform your normal daily activities, such as getting dressed and feeding yourself.  You cannot walk up and down stairs, or you feel exhausted when you do so.  You have shortness of breath or chest pain.  You have difficulty moving parts of your body.  You have weakness in only one area of the body or on only one side of the body.  You have a fever.  You have trouble speaking or swallowing.  You cannot control your bladder or bowel movements.  You have black or bloody vomit or stools. MAKE SURE YOU:  Understand these instructions.  Will watch your condition.  Will get help right away if you are not doing well or get worse. Document Released: 06/01/2005 Document Revised: 12/01/2011 Document Reviewed: 07/31/2011 [facility] Patient Information 2015 [place], [place]. This information is not intended to replace advice given to you by your health care provider. Make sure you discuss any questions you have with your health care provider.

## 2014-03-01 NOTE — ED Provider Notes (Signed)
Patient evaluated by me.  She has experienced this same palpitations and chest pain off and on since her ASD surgery.  This one occurred while cleaning with chemicals.  She has not seen a cardiologist in about 12 years.  Troponins negative x 2 over 12 hours since initial chest pain begun.  EKG has abnormalities can be normal in the setting of heart surgery.  EKG x 2 is unchanged.  Patient is non-toxic appearing, in no acute distress, resting comfortably in bed.  She was strongly encouraged to reach back out to cardiology and follow up within 3 days.  She is PERC negative, HEART score is 1, vitals remain within her normal limits and she is safe for discharge.  Medical screening examination/treatment/procedure(s) were conducted as a shared visit with non-physician practitioner(s) and myself.  I personally evaluated the patient during the encounter.   EKG Interpretation   Date/Time:  Thursday March 01 2014 00:21:54 EDT Ventricular Rate:  46 PR Interval:  141 QRS Duration: 90 QT Interval:  466 QTC Calculation: 408 R Axis:   69 Text Interpretation:  Sinus bradycardia Ectopic atrial rhythm RSR' in V1  or V2, probably normal variant No significant change since last tracing  Confirmed by Erroll Luna [phone]) on 03/01/2014 1:23:37 PM        Tomasita Crumble, MD 03/01/14 1327

## 2014-08-12 ENCOUNTER — Emergency Department: Payer: Self-pay | Admitting: Emergency Medicine

## 2014-10-18 ENCOUNTER — Emergency Department
Admission: EM | Admit: 2014-10-18 | Discharge: 2014-10-19 | Disposition: A | Discharge status: Home / Self Care | Payer: BLUE CROSS/BLUE SHIELD | Attending: Emergency Medicine | Admitting: Emergency Medicine

## 2014-10-18 ENCOUNTER — Encounter: Payer: Self-pay | Admitting: *Deleted

## 2014-10-18 ENCOUNTER — Emergency Department: Payer: BLUE CROSS/BLUE SHIELD

## 2014-10-18 ENCOUNTER — Other Ambulatory Visit: Payer: Self-pay

## 2014-10-18 DIAGNOSIS — R0789 Other chest pain: Secondary | ICD-10-CM

## 2014-10-18 DIAGNOSIS — Z3202 Encounter for pregnancy test, result negative: Secondary | ICD-10-CM | POA: Insufficient documentation

## 2014-10-18 DIAGNOSIS — R079 Chest pain, unspecified: Secondary | ICD-10-CM | POA: Insufficient documentation

## 2014-10-18 HISTORY — DX: Irritable bowel syndrome, unspecified: K58.9

## 2014-10-18 LAB — BASIC METABOLIC PANEL
Anion gap: 8 (ref 5–15)
BUN: 12 mg/dL (ref 6–20)
CHLORIDE: 103 mmol/L (ref 101–111)
CO2: 28 mmol/L (ref 22–32)
CREATININE: 0.83 mg/dL (ref 0.44–1.00)
Calcium: 9.3 mg/dL (ref 8.9–10.3)
GFR calc Af Amer: >60 mL/min (ref >60)
GLUCOSE: 89 mg/dL (ref 65–99)
Potassium: 3.6 mmol/L (ref 3.5–5.1)
Sodium: 139 mmol/L (ref 135–145)

## 2014-10-18 LAB — CBC
HCT: 36.7 % (ref 35.0–47.0)
Hemoglobin: 12.4 g/dL (ref 12.0–16.0)
MCH: 30.5 pg (ref 26.0–34.0)
MCHC: 33.9 g/dL (ref 32.0–36.0)
MCV: 90.2 fL (ref 80.0–100.0)
PLATELETS: 201 10*3/uL (ref 150–440)
RBC: 4.07 MIL/uL (ref 3.80–5.20)
RDW: 12.7 % (ref 11.5–14.5)
WBC: 6.1 10*3/uL (ref 3.6–11.0)

## 2014-10-18 MED ORDER — OXYCODONE-ACETAMINOPHEN 5-325 MG PO TABS
ORAL_TABLET | ORAL | Status: AC
  Administered 2014-10-18: 2 via ORAL
  Filled 2014-10-18: qty 2

## 2014-10-18 MED ORDER — OXYCODONE-ACETAMINOPHEN 5-325 MG PO TABS
2.0000 | ORAL_TABLET | Freq: Once | ORAL | Status: AC
  Administered 2014-10-18: 2 via ORAL

## 2014-10-18 NOTE — ED Provider Notes (Signed)
[facility] Emergency Department Provider Note    ____________________________________________  Time seen: 10:00 PM  I have reviewed the triage vital signs and the nursing notes.   HISTORY  Chief Complaint Chest Pain       HPI Deborah Navarro is a 34 y.o. female history of anemia, depression, history of ASD repair approximately 20 years ago not currently followed by any cardiologist who presents for evaluation of constant aching substernal chest tightness since approximately 2 PM today her father gave her nitroglycerin and that seemed to help her pain but her pain is never completely resolved. Current severity is moderate. There is no radiation, no association with some breath. Movement/adjusting her body improves the pain. Pain is not worsened with exertion. Recent illness including no cough, sneezing, runny nose, congestion, nausea vomiting or diarrhea.     Past Medical History  Diagnosis Date  . Streptococcal meningitis   . Endometriosis of fallopian tube   . Blood transfusion, without reported diagnosis   . Elevated blood pressure reading without diagnosis of hypertension   . Undiagnosed cardiac murmurs   . Depressive disorder, not elsewhere classified   . Unspecified personal history presenting hazards to health   . Anemia, unspecified   . Irritable bowel syndrome     Patient Active Problem List   Diagnosis Date Noted  . Right upper quadrant pain, chronic 02/26/2011  . Rash 02/26/2011  . COLONIC POLYPS 06/30/2010  . B12 DEFICIENCY 06/30/2010  . ANEMIA 06/30/2010  . DEPRESSION 06/30/2010  . Hx of MENINGITIS 06/30/2010  . ENDOMETRIOSIS 06/30/2010  . CARDIAC MURMUR 06/30/2010  . PAP SMEAR, ABNORMAL 06/30/2010  . CHICKENPOX, HX OF 06/30/2010    Past Surgical History  Procedure Laterality Date  . Open heart surgery  12-1994    asd repair  . Dilation and curettage of uterus    . Laparoscopic endometriosis fulguration  10-1999   endometriosis    Current Outpatient Rx  Name  Route  Sig  Dispense  Refill  . HYDROcodone-acetaminophen (NORCO/VICODIN) 5-325 MG per tablet   Oral   Take 1-2 tablets by mouth every 4 (four) hours as needed for moderate pain or severe pain.   10 tablet   0     Allergies Review of patient's allergies indicates no known allergies.  Family History  Problem Relation Age of Onset  . Cancer Mother 2651    non hodgkins lymphoma(mets to brain)  . Coronary artery disease Father   . Hypertension Father   . Hyperlipidemia Father   . Diabetes Father     Social History History  Substance Use Topics  . Smoking status: Never Smoker   . Smokeless tobacco: Not on file  . Alcohol Use: 0.6 oz/week    1 Shots of liquor per week    Review of Systems  Constitutional: Negative for fever. Eyes: Negative for visual changes. ENT: Negative for sore throat. Cardiovascular: Positive for chest pain. Respiratory: Negative for shortness of breath. Gastrointestinal: Negative for abdominal pain, vomiting and diarrhea. Genitourinary: Negative for dysuria. Musculoskeletal: Negative for back pain. Skin: Negative for rash. Neurological: Negative for headaches, focal weakness or numbness.   10-point ROS otherwise negative.  ____________________________________________   PHYSICAL EXAM:  VITAL SIGNS: ED Triage Vitals  Enc Vitals Group     BP 10/18/14 2124 147/78 mmHg     Pulse Rate 10/18/14 2124 70     Resp 10/18/14 2124 20     Temp 10/18/14 2124 98.1 F (36.7 C)  Temp src --      SpO2 10/18/14 2124 99 %     Weight 10/18/14 2124 145 lb (65.772 kg)     Height 10/18/14 2124 5\' 4"  (1.626 m)     Head Cir --      Peak Flow --      Pain Score 10/18/14 2125 5     Pain Loc --      Pain Edu? --      Excl. in GC? --      Constitutional: Alert and oriented. Well appearing and in no distress. Eyes: Conjunctivae are normal. PERRL. Normal extraocular movements. ENT   Head: Normocephalic  and atraumatic.   Nose: No congestion/rhinnorhea.   Mouth/Throat: Mucous membranes are moist.   Neck: No stridor. Hematological/Lymphatic/Immunilogical: No cervical lymphadenopathy. Cardiovascular: Normal rate, regular rhythm. Normal and symmetric distal pulses are present in all extremities. No murmurs, rubs, or gallops. Respiratory: Normal respiratory effort without tachypnea nor retractions. Breath sounds are clear and equal bilaterally. No wheezes/rales/rhonchi. Gastrointestinal: Soft and nontender. No distention. No abdominal bruits. There is no CVA tenderness. Genitourinary: deferred Musculoskeletal: Nontender with normal range of motion in all extremities. No joint effusions.  No lower extremity tenderness nor edema. Neurologic:  Normal speech and language. No gross focal neurologic deficits are appreciated. Speech is normal. No gait instability. Skin:  Skin is warm, dry and intact. No rash noted. Psychiatric: Mood and affect are normal. Speech and behavior are normal. Patient exhibits appropriate insight and judgment.  ____________________________________________    LABS (pertinent positives/negatives)  CBC and BMP unremarkable. POC upreg negative.  ____________________________________________   EKG  ED ECG REPORT   Date: 10/18/2014  EKG Time: 21:21  Rate: 63   Rhythm: normal EKG, normal sinus rhythm, unchanged from previous tracings  Axis: normal   Intervals:none  ST&T Change: T-wave inversion in V1, V2, V3,, III however T-wave inversions in V1 and V2 were seen on EKG 02/28/2014 and changes likely due to lead placement   ____________________________________________    RADIOLOGY   CXR: IMPRESSION: No active disease. ____________________________________________   PROCEDURES  Procedure(s) performed: None  Critical Care performed: No  ____________________________________________   INITIAL IMPRESSION / ASSESSMENT AND PLAN / ED COURSE  Pertinent  labs & imaging results that were available during my care of the patient were reviewed by me and considered in my medical decision making (see chart for details).  Deborah Navarro is a 34 y.o. female history of anemia, depression, history of ASD repair approximately 20 years ago presents with constant chest pain today. On exam she is very well-appearing, talkative, smiling. Exam is unremarkable, vital signs stable. EKG shows T-wave inversions V1, V2, V3, leads 3 however she did have similar T-wave inversions in V1 and V2 during her most recent EKGs and I suspect lead placement accounting for the different EKG today. Pain is not associated with exertion. She is PE RC negative and I doubt PE. Not consistent with acute aortic dissection. Heart score is 2 and symptoms appear inconsistent with ACS. She took full dose aspirin prior to arrival.   ----------------------------------------- 12:02 AM on 10/19/2014 -----------------------------------------  Patient improved. Basic labs unremarkable. Troponin pending. Plan for second troponin, if negative anticipate discharge home with PCP and [facility] follow-up as she has seen them in the past, though remotely. Care to Dr. Manson PasseyBrown.     ____________________________________________   FINAL CLINICAL IMPRESSION(S) / ED DIAGNOSES  Final diagnoses:  Chest pain     Gayla DossEryka A Rekita Miotke, MD 10/19/14 0004

## 2014-10-18 NOTE — ED Notes (Signed)
POTC urine pregnancy, results negative.

## 2014-10-18 NOTE — ED Notes (Signed)
Pt has chest pain since 1400 today.  Took 1 ntg with some relief. Pain returned tonight.  Pt has nausea.   Hx ASD repair.  Pt states tingling in left hand.  Skin warm and dry.  Alert.

## 2014-10-19 LAB — TROPONIN I: Troponin I: <0.03 ng/mL (ref <0.031)

## 2014-10-19 NOTE — ED Provider Notes (Signed)
As per Dr. Vista MinkGale's recommendation repeat troponin performed which was negative. Patient referred to [facility] for [facility] follow-up.  Darci Currentandolph N Marrisa Kimber, MD 10/19/14 [phone]

## 2014-10-19 NOTE — ED Notes (Signed)
Per pt request and MD validation, provided pt with ginger ale and ice chips.

## 2014-10-19 NOTE — Discharge Instructions (Signed)

## 2014-10-20 LAB — POCT PREGNANCY, URINE: Preg Test, Ur: NEGATIVE

## 2014-11-05 ENCOUNTER — Ambulatory Visit (INDEPENDENT_AMBULATORY_CARE_PROVIDER_SITE_OTHER): Payer: BLUE CROSS/BLUE SHIELD | Admitting: Cardiovascular Disease

## 2014-11-05 ENCOUNTER — Encounter: Payer: Self-pay | Admitting: Cardiovascular Disease

## 2014-11-05 VITALS — BP 140/88 | HR 75 | Ht 64.5 in | Wt 147.5 lb

## 2014-11-05 DIAGNOSIS — R0789 Other chest pain: Secondary | ICD-10-CM

## 2014-11-05 DIAGNOSIS — R208 Other disturbances of skin sensation: Secondary | ICD-10-CM | POA: Diagnosis not present

## 2014-11-05 DIAGNOSIS — R079 Chest pain, unspecified: Secondary | ICD-10-CM | POA: Diagnosis not present

## 2014-11-05 DIAGNOSIS — R200 Anesthesia of skin: Secondary | ICD-10-CM

## 2014-11-05 NOTE — Assessment & Plan Note (Signed)
Etiology of her left arm numbness is concerning for nerve impingement. She does have pain up in her neck, left aspect, posterior shoulder extending down her arm Suggested she consider seeing chiropractic if no improvement of her symptoms She'll continue NSAIDs, suggested she start icing

## 2014-11-05 NOTE — Patient Instructions (Signed)
  No medication changes were made.  Continue NSAIDS as needed for chest pain and neck pain  Please call if chest pain continues  Please call us if you have new issues that need to be addressed before your next appt.

## 2014-11-05 NOTE — Assessment & Plan Note (Addendum)
Chest pain is atypical in nature. Does not present with exertion, typically at rest. EKG does not suggest pericarditis Symptoms concerning for viral etiology given the nausea, general fatigue/malaise. She will continue NSAIDs for now. Symptoms slowly improving. If symptoms get worse, additional workup could be performed such as echocardiogram. Low likelihood of ischemia

## 2014-11-05 NOTE — Progress Notes (Signed)
Patient ID: Deborah Navarro, female    DOB: 04-29-1981, 34 y.o.   MRN: [id number]  HPI Comments: Deborah Navarro is a very pleasant 34 year old woman with history of ASD repair, presenting for evaluation of chest and neck pain.  She reports that approximately 3 weeks ago she was playing softball, she ate food from [employer] She developed nausea and weakness later that evening, head spinning She went to bed, rested. On Saturday, she had headache with pain behind her eyes. She felt drained until Monday. She went back to work on Tuesday developed tingling in her left arm, neck discomfort. 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 She has tried to play softball but initially had profound weakness, fatigue, chest discomfort.  Since then she has not played softball. She does report some discomfort in her left neck area radiating behind her left shoulder, sometimes around her scapular, down her left arm. For chest pain she has been taking NSAIDs  Typically she is very active without any symptoms. Is active with softball on a regular basis, active work life, cleans 3 houses typically per day With no problems  EKG shows normal sinus rhythm with rate 75 bpm, T-wave abnormality in V1, V2, nonspecific    No Known Allergies  No current outpatient prescriptions on file prior to visit.   No current facility-administered medications on file prior to visit.    Past Medical History  Diagnosis Date  . Streptococcal meningitis   . Endometriosis of fallopian tube   . Blood transfusion, without reported diagnosis   . Elevated blood pressure reading without diagnosis of hypertension   . Undiagnosed cardiac murmurs   . Depressive disorder, not elsewhere classified   . Unspecified personal history presenting hazards to health   . Anemia, unspecified   . Irritable bowel syndrome     Past Surgical History  Procedure Laterality Date  .  Open heart surgery  12-1994    asd repair  . Dilation and curettage of uterus    . Laparoscopic endometriosis fulguration  10-1999    endometriosis    Social History  reports that she has never smoked. She does not have any smokeless tobacco history on file. She reports that she drinks about 0.6 oz of alcohol per week. She reports that she does not use illicit drugs.  Family History family history includes Cancer (age of onset: 43) in her mother; Coronary artery disease in her father; Diabetes in her father; Hyperlipidemia in her father; Hypertension in her father.   Review of Systems  Constitutional: Negative.   Respiratory: Negative.   Cardiovascular: Negative.   Gastrointestinal: Negative.   Musculoskeletal: Negative.   Skin: Negative.   Neurological: Negative.   Hematological: Negative.   Psychiatric/Behavioral: Negative.   All other systems reviewed and are negative.   BP 140/88 mmHg  Pulse 75  Ht 5' 4.5" (1.638 m)  Wt 147 lb 8 oz (66.906 kg)  BMI 24.94 kg/m2  LMP 10/11/2014  Physical Exam  Constitutional: She is oriented to person, place, and time. She appears well-developed and well-nourished.  HENT:  Head: Normocephalic.  Nose: Nose normal.  Mouth/Throat: Oropharynx is clear and moist.  Eyes: Conjunctivae are normal. Pupils are equal, round, and reactive to light.  Neck: Normal range of motion. Neck supple. No JVD present.  Cardiovascular: Normal rate, regular rhythm, S1 normal, S2 normal, normal heart sounds and intact distal pulses.  Exam reveals no gallop and  no friction rub.   No murmur heard. Pulmonary/Chest: Effort normal and breath sounds normal. No respiratory distress. She has no wheezes. She has no rales. She exhibits no tenderness.  Abdominal: Soft. Bowel sounds are normal. She exhibits no distension. There is no tenderness.  Musculoskeletal: Normal range of motion. She exhibits no edema or tenderness.  Lymphadenopathy:    She has no cervical  adenopathy.  Neurological: She is alert and oriented to person, place, and time. Coordination normal.  Skin: Skin is warm and dry. No rash noted. No erythema.  Psychiatric: She has a normal mood and affect. Her behavior is normal. Judgment and thought content normal.    Assessment and Plan  Nursing note and vitals reviewed.

## 2014-12-20 ENCOUNTER — Ambulatory Visit (INDEPENDENT_AMBULATORY_CARE_PROVIDER_SITE_OTHER): Payer: BLUE CROSS/BLUE SHIELD | Admitting: Family Medicine

## 2014-12-20 ENCOUNTER — Encounter: Payer: Self-pay | Admitting: Family Medicine

## 2014-12-20 VITALS — BP 120/72 | HR 81 | Temp 97.7°F | Ht 64.5 in | Wt 145.8 lb

## 2014-12-20 DIAGNOSIS — M5412 Radiculopathy, cervical region: Secondary | ICD-10-CM | POA: Diagnosis not present

## 2014-12-20 MED ORDER — PREDNISONE 20 MG PO TABS: ORAL_TABLET | ORAL | Status: DC

## 2014-12-20 MED ORDER — TIZANIDINE HCL 4 MG PO TABS: 4.0000 mg | ORAL_TABLET | Freq: Every evening | ORAL | Status: DC

## 2014-12-20 MED ORDER — AMITRIPTYLINE HCL 25 MG PO TABS: 25.0000 mg | ORAL_TABLET | Freq: Every day | ORAL | Status: DC

## 2014-12-20 NOTE — Progress Notes (Signed)
Pre visit review using our clinic review tool, if applicable. No additional management support is needed unless otherwise documented below in the visit note. 

## 2014-12-20 NOTE — Progress Notes (Signed)
Dr. Karleen Hampshire T. Kewanna Kasprzak, MD, CAQ Sports Medicine Primary [facility] and Sports Medicine [address] [place], [address] Phone: [phone] Fax: [phone]  12/20/2014  Patient: Deborah Navarro, MRN: [id number], DOB: Aug 17, 1980, 34 y.o.  Primary Physician:  Kerby Nora, MD  Chief Complaint: Neck Pain  Subjective:   Deborah Navarro is a 34 y.o. very pleasant female patient who presents with the following:  Neck is hurting all on the l side. Started back into April, and underneath into the shoulder blade.  Goes from the L side of her head and down to the neck and into the left arm.   Stopped playing softball.  Will get a little better on Friday, then worse with working.  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  Had been playing softball 2-3 days a week.   RHD.   Ibuprofen and Alleve - some stomach discomfort.  Some heat and ice.  Muscle relaxers will help a little.   1-2 L fingers more dorsum Top of forearm L   Past Medical History, Surgical History, Social History, Family History, Problem List, Medications, and Allergies have been reviewed and updated if relevant.  Patient Active Problem List   Diagnosis Date Noted  . Chest pain 11/05/2014  . Arm numbness left 11/05/2014  . Right upper quadrant pain, chronic 02/26/2011  . Rash 02/26/2011  . COLONIC POLYPS 06/30/2010  . B12 DEFICIENCY 06/30/2010  . ANEMIA 06/30/2010  . DEPRESSION 06/30/2010  . Hx of MENINGITIS 06/30/2010  . ENDOMETRIOSIS 06/30/2010  . CARDIAC MURMUR 06/30/2010  . PAP SMEAR, ABNORMAL 06/30/2010  . CHICKENPOX, HX OF 06/30/2010    Past Medical History  Diagnosis Date  . Streptococcal meningitis   . Endometriosis of fallopian tube   . Blood transfusion, without reported diagnosis   . Elevated blood pressure reading without diagnosis of hypertension   . Undiagnosed cardiac murmurs   . Depressive disorder, not elsewhere classified   . Unspecified personal history presenting hazards to  health   . Anemia, unspecified   . Irritable bowel syndrome     Past Surgical History  Procedure Laterality Date  . Open heart surgery  12-1994    asd repair  . Dilation and curettage of uterus    . Laparoscopic endometriosis fulguration  10-1999    endometriosis    History   Social History  . Marital Status: Married    Spouse Name: N/A  . Number of Children: 1  . 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 Smoking status: Never Smoker   . Smokeless tobacco: Not on file  . Alcohol Use: 0.6 oz/week    1 Shots of liquor per week  . Drug Use: No  . Sexual Activity: Not on file   Other Topics Concern  . Not on file   Social History Narrative   Regular exercise--yes softball, some walking   Diet: Moderate, some water, picky eater             Family History  Problem Relation Age of Onset  . Cancer Mother 75    non hodgkins lymphoma(mets to brain)  . Coronary artery disease Father   . Hypertension Father   . Hyperlipidemia Father   . Diabetes Father     No Known Allergies  Medication list reviewed and updated in full in [facility].  GEN: no acute illness or fever CV: No  chest pain or shortness of breath MSK: detailed above Neuro: neurological signs are described above ROS O/w per HPI  Objective:   BP 120/72 mmHg  Pulse 81  Temp(Src) 97.7 F (36.5 C) (Oral)  Ht 5' 4.5" (1.638 m)  Wt 145 lb 12.8 oz (66.134 kg)  BMI 24.65 kg/m2  SpO2 98%  LMP 12/03/2014 (Approximate)   GEN: Well-developed,well-nourished,in no acute distress; alert,appropriate and cooperative throughout examination HEENT: Normocephalic and atraumatic without obvious abnormalities. Ears, externally no deformities PULM: Breathing comfortably in no respiratory distress EXT: No clubbing, cyanosis, or edema PSYCH: Normally interactive. Cooperative during the interview. Pleasant. Friendly and conversant.  Not anxious or depressed appearing. Normal, full affect.  CERVICAL SPINE EXAM Range of motion: Flexion, extension, lateral bending, and rotation: approximate 20% loss of extension and flexion and 25% of lateral rotational movements. Pain with terminal motion: yes Spinous Processes: NT SCM: NT Upper paracervical muscles: left posterior cervical and lateral. Upper traps: left trapezius  Grip strength is decreased slightly on the left compared to the right Decreased sensation to soft touch his pinprick on the dorsum of one to fingers and in the same distribution on the forearm.  Radiology: No results found.  Assessment and Plan:   Left cervical radiculopathy   Probable nerve encroachment on the left. Reviewed self traction maneuvers and classical Thea SilversmithMacKenzie and cervical spine rehabilitation. Prednisone burst and taper.  Elavil at nighttime.  Zanaflex as needed.  Follow-up: 4-6 weeks  New Prescriptions   AMITRIPTYLINE (ELAVIL) 25 MG TABLET    Take 1 tablet (25 mg total) by mouth at bedtime.   PREDNISONE (DELTASONE) 20 MG TABLET    2 tabs po for 7 days, then 1 tab po for 7 days   TIZANIDINE (ZANAFLEX) 4 MG TABLET    Take 1 tablet (4 mg total) by mouth Nightly.   No orders of the defined types were placed in this encounter.    Signed,  Elpidio GaleaSpencer T. Hobart Marte, MD   Patient's Medications  New Prescriptions   AMITRIPTYLINE (ELAVIL) 25 MG TABLET    Take 1 tablet (25 mg total) by mouth at bedtime.   PREDNISONE (DELTASONE) 20 MG TABLET    2 tabs po for 7 days, then 1 tab po for 7 days   TIZANIDINE (ZANAFLEX) 4 MG TABLET    Take 1 tablet (4 mg total) by mouth Nightly.  Previous Medications   IBUPROFEN (ADVIL,MOTRIN) 600 MG TABLET    Take 600 mg by mouth every 6 (six) hours as needed.  Modified Medications   No medications on file  Discontinued Medications   No medications on file

## 2015-01-16 ENCOUNTER — Encounter: Payer: Self-pay | Admitting: Family Medicine

## 2015-01-16 ENCOUNTER — Ambulatory Visit (INDEPENDENT_AMBULATORY_CARE_PROVIDER_SITE_OTHER)
Admission: RE | Admit: 2015-01-16 | Discharge: 2015-01-16 | Disposition: A | Discharge status: Home / Self Care | Payer: BLUE CROSS/BLUE SHIELD | Source: Ambulatory Visit | Attending: Family Medicine | Admitting: Family Medicine

## 2015-01-16 ENCOUNTER — Ambulatory Visit (INDEPENDENT_AMBULATORY_CARE_PROVIDER_SITE_OTHER): Payer: BLUE CROSS/BLUE SHIELD | Admitting: Family Medicine

## 2015-01-16 VITALS — BP 120/80 | HR 96 | Temp 98.3°F | Ht 64.5 in | Wt 145.5 lb

## 2015-01-16 DIAGNOSIS — M5412 Radiculopathy, cervical region: Secondary | ICD-10-CM | POA: Diagnosis not present

## 2015-01-16 MED ORDER — AMITRIPTYLINE HCL 50 MG PO TABS: 50.0000 mg | ORAL_TABLET | Freq: Every day | ORAL | Status: DC

## 2015-01-16 NOTE — Progress Notes (Signed)
Dr. Karleen Hampshire T. Ludivina Guymon, MD, CAQ Sports Medicine Primary Care and Sports Medicine [address] [place], [address] Phone: [phone] Fax: [phone]  01/16/2015  Patient: Deborah Navarro, MRN: [id number], DOB: 06-10-1981, 34 y.o.  Primary Physician:  Kerby Nora, MD  Chief Complaint: Follow-up  Subjective:   Deborah Navarro is a 34 y.o. very pleasant female patient who presents with the following:  Cervical radiculopathy, not really much improved. A lot of pushing and pulling. She does seem to be a little bit better at the beginning, but over time her symptoms have returned and she still is continuing to have radicular symptoms down the left side of her neck. She is working quite a bit, and she is working at [employer] now full-time.  Elavil  Ashley Torticollis - PT  12/20/2014 Last OV with Hannah Beat, MD  Neck is hurting all on the l side. Started back into April, and underneath into the shoulder blade.  Goes from the L side of her head and down to the neck and into the left arm.   Stopped playing softball.  Will get a little better on Friday, then worse with working.  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  Had been playing softball 2-3 days a week.   RHD.   Ibuprofen and Alleve - some stomach discomfort.  Some heat and ice.  Muscle relaxers will help a little.   1-2 L fingers more dorsum Top of forearm L   Past Medical History, Surgical History, Social History, Family History, Problem List, Medications, and Allergies have been reviewed and updated if relevant.  Patient Active Problem List   Diagnosis Date Noted  . Chest pain 11/05/2014  . Arm numbness left 11/05/2014  . Right upper quadrant pain, chronic 02/26/2011  . Rash 02/26/2011  . COLONIC POLYPS 06/30/2010  . B12 DEFICIENCY 06/30/2010  . ANEMIA 06/30/2010  . DEPRESSION 06/30/2010  . Hx of MENINGITIS 06/30/2010  . ENDOMETRIOSIS 06/30/2010  . CARDIAC MURMUR 06/30/2010  . PAP SMEAR,  ABNORMAL 06/30/2010  . CHICKENPOX, HX OF 06/30/2010    Past Medical History  Diagnosis Date  . Streptococcal meningitis   . Endometriosis of fallopian tube   . Blood transfusion, without reported diagnosis   . Elevated blood pressure reading without diagnosis of hypertension   . Undiagnosed cardiac murmurs   . Depressive disorder, not elsewhere classified   . Unspecified personal history presenting hazards to health   . Anemia, unspecified   . Irritable bowel syndrome     Past Surgical History  Procedure Laterality Date  . Open heart surgery  12-1994    asd repair  . Dilation and curettage of uterus    . Laparoscopic endometriosis fulguration  10-1999    endometriosis    History   Social History  . Marital Status: Married    Spouse Name: N/A  . Number of Children: 1  . 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 Smoking status: Never Smoker   . Smokeless tobacco: Never Used  . Alcohol Use: 0.6 oz/week    1 Shots of liquor per week  . Drug Use: No  . Sexual Activity: Not on file   Other Topics Concern  . Not on file   Social History Narrative   Regular exercise--yes softball, some walking   Diet: Moderate, some water, picky eater  Family History  Problem Relation Age of Onset  . Cancer Mother 27    non hodgkins lymphoma(mets to brain)  . Coronary artery disease Father   . Hypertension Father   . Hyperlipidemia Father   . Diabetes Father     No Known Allergies  Medication list reviewed and updated in full in [place] Link.  GEN: no acute illness or fever CV: No chest pain or shortness of breath MSK: detailed above Neuro: neurological signs are described above ROS O/w per HPI  Objective:   BP 120/80 mmHg  Pulse 96  Temp(Src) 98.3 F (36.8 C) (Oral)  Ht 5' 4.5" (1.638 m)  Wt 145 lb 8 oz (65.998 kg)  BMI 24.60 kg/m2  LMP 01/05/2015   GEN:  Well-developed,well-nourished,in no acute distress; alert,appropriate and cooperative throughout examination HEENT: Normocephalic and atraumatic without obvious abnormalities. Ears, externally no deformities PULM: Breathing comfortably in no respiratory distress EXT: No clubbing, cyanosis, or edema PSYCH: Normally interactive. Cooperative during the interview. Pleasant. Friendly and conversant. Not anxious or depressed appearing. Normal, full affect.  CERVICAL SPINE EXAM Range of motion: Flexion, extension, lateral bending, and rotation: approximate 20% loss of extension and flexion and 25% of lateral rotational movements. Pain with terminal motion: yes Spinous Processes: NT SCM: NT Upper paracervical muscles: left posterior cervical and lateral. Upper traps: left trapezius  Grip strength is decreased slightly on the left compared to the right Decreased sensation to soft touch his pinprick on the dorsum of one to fingers and in the same distribution on the forearm.  Radiology: Dg Cervical Spine Complete  01/16/2015   CLINICAL DATA:  Radiculopathy.  EXAM: CERVICAL SPINE  4+ VIEWS  COMPARISON:  None.  FINDINGS: No acute soft tissue bony abnormality identified. Normal alignment. Normal mineralization. Mild degenerative changes C5-C6 with bilateral neural foraminal narrowing at this level . Pulmonary apices are clear. Prior median sternotomy .  IMPRESSION: Degenerative changes C5-C6 with bilateral neural foraminal narrowing at this level. Exam otherwise negative .   Electronically Signed   By: Maisie Fus  Register   On: 01/16/2015 12:50     Assessment and Plan:   Left cervical radiculopathy - Plan: DG Cervical Spine Complete, Ambulatory referral to Physical Therapy  Very pleasant patient known well. Continues to have difficulties. We are going to increase her Elavil and try to do some physical therapy formally with some McKenzie protocol and traction.  We discussed other options, but at this point  she would like to go conservatively.  Follow-up: Return in about 5 weeks (around 02/20/2015).  New Prescriptions   No medications on file   Orders Placed This Encounter  Procedures  . DG Cervical Spine Complete  . Ambulatory referral to Physical Therapy    Signed,  Karleen Hampshire T. Corina Stacy, MD   Patient's Medications  New Prescriptions   No medications on file  Previous Medications   IBUPROFEN (ADVIL,MOTRIN) 600 MG TABLET    Take 600 mg by mouth every 6 (six) hours as needed.   TIZANIDINE (ZANAFLEX) 4 MG TABLET    Take 1 tablet (4 mg total) by mouth Nightly.  Modified Medications   Modified Medication Previous Medication   AMITRIPTYLINE (ELAVIL) 50 MG TABLET amitriptyline (ELAVIL) 25 MG tablet      Take 1 tablet (50 mg total) by mouth at bedtime.    Take 1 tablet (25 mg total) by mouth at bedtime.  Discontinued Medications   PREDNISONE (DELTASONE) 20 MG TABLET    2 tabs po for 7 days, then  1 tab po for 7 days

## 2015-01-16 NOTE — Progress Notes (Signed)
Pre visit review using our clinic review tool, if applicable. No additional management support is needed unless otherwise documented below in the visit note. 

## 2015-01-29 ENCOUNTER — Ambulatory Visit: Payer: BLUE CROSS/BLUE SHIELD | Attending: Family Medicine

## 2015-01-29 DIAGNOSIS — M542 Cervicalgia: Secondary | ICD-10-CM | POA: Insufficient documentation

## 2015-01-29 DIAGNOSIS — R29898 Other symptoms and signs involving the musculoskeletal system: Secondary | ICD-10-CM | POA: Diagnosis present

## 2015-01-29 NOTE — Therapy (Signed)
[facility] MAIN [facility] SERVICES [address] [place], [place], [address] Phone: [phone]   Fax:  [phone]  Physical Therapy Evaluation  Patient Details  Name: Deborah Navarro MRN: [id number] Date of Birth: 02-15-1981 Referring Provider:  Hannah Beat, MD  Encounter Date: 01/29/2015      PT End of Session - 01/29/15 1051    Visit Number 1   Number of Visits 9   Date for PT Re-Evaluation 02/26/15   PT Start Time 0805   PT Stop Time 0910   PT Time Calculation (min) 65 min   Activity Tolerance Patient tolerated treatment well   Behavior During Therapy [facility] for tasks assessed/performed      Past Medical History  Diagnosis Date  . Streptococcal meningitis   . Endometriosis of fallopian tube   . Blood transfusion, without reported diagnosis   . Elevated blood pressure reading without diagnosis of hypertension   . Undiagnosed cardiac murmurs   . Depressive disorder, not elsewhere classified   . Unspecified personal history presenting hazards to health   . Anemia, unspecified   . Irritable bowel syndrome     Past Surgical History  Procedure Laterality Date  . Open heart surgery  12-1994    asd repair  . Dilation and curettage of uterus    . Laparoscopic endometriosis fulguration  10-1999    endometriosis    There were no vitals filed for this visit.  Visit Diagnosis:  Cervicalgia  Decreased ROM of neck      Subjective Assessment - 01/29/15 0815    Subjective pt reports she is having  pain on the left side of her head, neck, shoulder, and then numbness and tingling below shoulder, which started in April of this year. 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  pt now works at [employer] varies duties and cleans less houses.   pt reports change in work situation happened about 1.5 month ago and also was prescribed medication and now has less intense symptoms.  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   pt reports pain varies from location, either in her face, neck or arm.  pt reports lying in an incline position is the most comfortable position.   pt reports current pain is in the head and the neck 3-4/10. Pt relates pain in the neck sometimes feels like something is in her throat. pt has stopped playing softball because she experiences pain while swinging the bat.  pt denies any injuires in the neck. pt denies having any prior pain.   pt reports pain was gradual and mostly located in her left shoulder blade until one day when she was driving and heard pop in her neck while turning right and then she started having neck symptoms.  pt reports numbness and tingling in the hand primairly in dorsum aspect of 2rd and 3rd  digit.      Patient Stated Goals decrease neck pain    Currently in Pain? Yes   Pain Score 4    Pain Location Neck   Pain Orientation Left   Pain Descriptors / Indicators Dull   Pain Type Chronic pain   Pain Onset More than a month ago         UE Dermatomes: WNL UE Myotomes: WNL  Reflexes: Bilateral biceps: +1 Bilateral triceps: +1 Bilateral brachioradialis: +1  Grip Strength: Right: 80#  Left: 30#  Cervical AROM in sitting: Right cervical rotation: 65 with pain Left cervical rotation: 40 with pain Extension: 20 Flexion: 30 Cervical retraction: WNL  Posture in sitting: pain on left lateral cervical Decreased cervical lordosis Increased thoracic kyphosis Verbal and tactile cueing for improved posture resulted in numbness in her dorsum of left hand and increased pain in neck   Followed by cervical retractions x10 in sitting: no change in symptoms Followed by cervical retractions x10  With over pressure in sitting: slight increase intensity of symptoms with no centralization  Followed by cervical retractions x10 with extension x10: increased intensity of numbness in dorsum of hand increased pain in  neck Followed by cervical retractions x10 in supine:  Decrease in symptoms and centralization of neck symptoms  Palpation: Increased numbness in left hand with palpation in left upper trap Pain in left C5 facet  Manual Cervical traction in supine 2x10 seconds on/10 seconds off resulted in no change in hand symptoms and increase tension in neck   Therex: Cervical retractions in supine x10 Cervical retractions in sitting with over pressure x10 Instructed pt on how to use towel roll for tactile cueing to improve sitting posture                           PT Education - 01/29/15 1048    Education provided Yes   Education Details plan of care, HEP (cervical retractions with over pressure, cervical retractions in supine), improving cervical lordosis curve   Person(s) Educated Patient   Methods Explanation;Demonstration   Comprehension Verbalized understanding;Returned demonstration             PT Long Term Goals - 01/29/15 1114    PT LONG TERM GOAL #1   Title pt's left grip strength will improve by 20# in order to better preform functional activites at work    Baseline left grip strength: 30#   Time 4   Period Weeks   Status New   PT LONG TERM GOAL #2   Title pt left cervical rotation will improve by 10 degrees with less than 2/10 pain in order to improve driving   Baseline left cervical rotation 40 degrees    Time 4   Period Weeks   Status New   PT LONG TERM GOAL #3   Title pt will be able to swing bat with less than 2/10 pain in order to return to prior level of function   Baseline not playing softball currently due to neck pain    Time 4   Period Weeks   Status New               Plan - 01/29/15 1938    Clinical Impression Statement pt is a 34 year old female who presents wtih ~ month history of neck pain with radicular symptoms.  pt demonstrates signs/symptoms associated with cervical derangement with extension preference.  pt presents with  decreased cervical ROM, decreased left UE strength and pain.  pt will benefit from skilled PT services to improve deficits to return to prior level of function.    Pt will benefit from skilled therapeutic intervention in order to improve on the following deficits Decreased range of motion;Pain;Decreased strength;Impaired sensation;Postural dysfunction   Rehab Potential Good   PT Frequency 2x / week   PT Duration 4 weeks   PT Treatment/Interventions Aquatic Therapy;ADLs/Self Care Home Management;Biofeedback;Iontophoresis 4mg /ml Dexamethasone;Therapeutic exercise;Therapeutic activities;Functional mobility training;Traction;Neuromuscular re-education;Patient/family education;Manual techniques;Passive range of motion  Problem List Patient Active Problem List   Diagnosis Date Noted  . Chest pain 11/05/2014  . Arm numbness left 11/05/2014  . Right upper quadrant pain, chronic 02/26/2011  . Rash 02/26/2011  . COLONIC POLYPS 06/30/2010  . B12 DEFICIENCY 06/30/2010  . ANEMIA 06/30/2010  . DEPRESSION 06/30/2010  . Hx of MENINGITIS 06/30/2010  . ENDOMETRIOSIS 06/30/2010  . CARDIAC MURMUR 06/30/2010  . PAP SMEAR, ABNORMAL 06/30/2010  . CHICKENPOX, HX OF 06/30/2010   Janus Molder, SPT  This entire session was performed under direct supervision and direction of a licensed therapist/therapist assistant . I have personally read, edited and approve of the note as written. Carlyon Shadow. Tortorici, PT, DPT [phone] Tortorici,Ashley 01/30/2015, 9:46 AM  [facility] MAIN [facility] SERVICES [address] [place], [place], [address] Phone: [phone]   Fax:  [phone]    `

## 2015-02-04 ENCOUNTER — Ambulatory Visit: Payer: BLUE CROSS/BLUE SHIELD

## 2015-02-04 DIAGNOSIS — M542 Cervicalgia: Secondary | ICD-10-CM

## 2015-02-04 DIAGNOSIS — R29898 Other symptoms and signs involving the musculoskeletal system: Secondary | ICD-10-CM

## 2015-02-04 NOTE — Patient Instructions (Addendum)
   All exercises provided were adapted from hep2go.com. Patient was provided a written handout with pictures as described. Any additional cues were manually entered in to handout and copied in to this document.   Low Rows on Single Leg  Standing, facing the door, place the band (shoulder to head height). Reach up and grasp the band with both hands. Fish hook and set your shoulders down to your sides. With elbow straight, pull your arms down to your sides and hold.    Thoracic Extension  Sitting on a chair with the top hitting your mid back, place your hands crossed on your shoulders.  Slowly tilt back so you are stretching your mid back.  Hold for 20-30 seconds.  Release and repeat.     CERVICAL EXTENSION  Tilt your head upwards, then return back to looking straight ahead.

## 2015-02-04 NOTE — Therapy (Signed)
[facility] MAIN [facility] SERVICES [address] [place], [place], [address] Phone: [phone]   Fax:  [phone]  Physical Therapy Treatment  Patient Details  Name: Deborah Navarro MRN: [id number] Date of Birth: 07-16-1980 Referring Provider:  Hannah Beat, MD  Encounter Date: 02/04/2015      PT End of Session - 02/04/15 0948    Visit Number 2   Number of Visits 9   Date for PT Re-Evaluation 02/26/15   PT Start Time 0845   PT Stop Time 0935   PT Time Calculation (min) 50 min   Activity Tolerance Patient tolerated treatment well   Behavior During Therapy [facility] for tasks assessed/performed      Past Medical History  Diagnosis Date  . Streptococcal meningitis   . Endometriosis of fallopian tube   . Blood transfusion, without reported diagnosis   . Elevated blood pressure reading without diagnosis of hypertension   . Undiagnosed cardiac murmurs   . Depressive disorder, not elsewhere classified   . Unspecified personal history presenting hazards to health   . Anemia, unspecified   . Irritable bowel syndrome     Past Surgical History  Procedure Laterality Date  . Open heart surgery  12-1994    asd repair  . Dilation and curettage of uterus    . Laparoscopic endometriosis fulguration  10-1999    endometriosis    There were no vitals filed for this visit.  Visit Diagnosis:  Cervicalgia  Decreased ROM of neck      Subjective Assessment - 02/04/15 0908    Subjective Patient reports she has been diligent with her HEP, however she has not found much if any relief. Patient reports she is somewhat worried she has a patho-anatomical change.   Limitations Lifting   Patient Stated Goals decrease neck pain    Currently in Pain? Yes   Pain Score 7    Pain Location Neck   Pain Orientation Left   Pain Descriptors / Indicators Dull   Pain Type Chronic pain   Pain Onset More than a month ago   Pain Frequency Constant   Effect of  Pain on Daily Activities Has caused her to decrease her softball playing and has pain with her activities at work.       Distractions (supine and seated) grade II-III well tolerated and reduced her symptoms x 4 bouts for 1-2 minutes each (reduced ~ 50%)   Central P-As grade III-IV to her thoracic spine in prone, tightness noted throughout her t-spine with mild resolution of symptoms afterwards "it's not as tight feeling".   Standing shoulder extensions (looking for LT activity, increased her lower back symptoms, likely indicative of latissimus substitution)  Mid Rows with red t-band (1 set x 12 repetitions), low rows x 12 with red t-band (cuing for keeping her elbow extended which adequately activated her LTs).   Patient with increased pain with repeated flexion testing x 8, minimal increase (some decrease) with extension repeated motions   1st rib mobilization provided to left side in prone grade II-III, no change in symptoms afterwards   Soft tissue mobilization to left sided MT, UT, rhomboids, with small decrease in symptoms afterwards. No evidence of trigger points or areas of restriction.   Seated thoracic extensions x 10 repetitions with 2-3 second holds, well tolerated with no cuing required.  PT Education - 02/04/15 0947    Education provided Yes   Education Details Provided HEP, began TNE, provided timeline and expectations of therapy.    Person(s) Educated Patient   Methods Explanation;Demonstration;Handout   Comprehension Verbalized understanding;Returned demonstration             PT Long Term Goals - 01/29/15 1114    PT LONG TERM GOAL #1   Title pt's left grip strength will improve by 20# in order to better preform functional activites at work    Baseline left grip strength: 30#   Time 4   Period Weeks   Status New   PT LONG TERM GOAL #2   Title pt left cervical rotation will improve by 10 degrees with less than  2/10 pain in order to improve driving   Baseline left cervical rotation 40 degrees    Time 4   Period Weeks   Status New   PT LONG TERM GOAL #3   Title pt will be able to swing bat with less than 2/10 pain in order to return to prior level of function   Baseline not playing softball currently due to neck pain    Time 4   Period Weeks   Status New               Plan - 02/04/15 1610    Clinical Impression Statement Patient responded well to cervical distraction (~50% reduction in symptoms), soft tissue mobilization, central PAs, and lower trapezius activation exercises. Patient states she flexes her neck at work quite often, and has likely overloaded (from repetition) posterior cervical musculature in a lengthened position over time causing an overuse "injury"., which has caused her body to attempt to provide compensatory tightness throughout her t-spine and c-spine. Patient would benefit from additional peri-scapular strengthening, training to complete activities without flexing her spine, and manual techniques to relieve tension.    Pt will benefit from skilled therapeutic intervention in order to improve on the following deficits Decreased range of motion;Pain;Decreased strength;Impaired sensation;Postural dysfunction   Rehab Potential Good   PT Frequency 2x / week   PT Duration 4 weeks   PT Treatment/Interventions Aquatic Therapy;ADLs/Self Care Home Management;Biofeedback;Iontophoresis 4mg /ml Dexamethasone;Therapeutic exercise;Therapeutic activities;Functional mobility training;Traction;Neuromuscular re-education;Patient/family education;Manual techniques;Passive range of motion   PT Next Visit Plan Cervical distraction, lower trapezius strengthening, and ADL/work related task training without flexing her neck.    PT Home Exercise Plan See patient instructions   Consulted and Agree with Plan of Care Patient        Problem List Patient Active Problem List   Diagnosis Date Noted   . Chest pain 11/05/2014  . Arm numbness left 11/05/2014  . Right upper quadrant pain, chronic 02/26/2011  . Rash 02/26/2011  . COLONIC POLYPS 06/30/2010  . B12 DEFICIENCY 06/30/2010  . ANEMIA 06/30/2010  . DEPRESSION 06/30/2010  . Hx of MENINGITIS 06/30/2010  . ENDOMETRIOSIS 06/30/2010  . CARDIAC MURMUR 06/30/2010  . PAP SMEAR, ABNORMAL 06/30/2010  . CHICKENPOX, HX OF 06/30/2010   Kerin Ransom, PT, DPT    02/04/2015, 10:14 AM  [facility] MAIN [facility] SERVICES [address] [place], [place], [address] Phone: [phone]   Fax:  [phone]

## 2015-02-07 ENCOUNTER — Ambulatory Visit: Payer: BLUE CROSS/BLUE SHIELD

## 2015-02-07 DIAGNOSIS — M542 Cervicalgia: Secondary | ICD-10-CM | POA: Diagnosis not present

## 2015-02-07 DIAGNOSIS — R29898 Other symptoms and signs involving the musculoskeletal system: Secondary | ICD-10-CM

## 2015-02-07 NOTE — Therapy (Signed)
[facility] MAIN [facility] SERVICES [address] [place], [place], [address] Phone: [phone]   Fax:  [phone]  Physical Therapy Treatment  Patient Details  Name: Deborah Navarro MRN: [id number] Date of Birth: Sep 17, 1980 Referring Provider:  Hannah Beat, MD  Encounter Date: 02/07/2015      PT End of Session - 02/07/15 1507    Visit Number 3   Number of Visits 9   Date for PT Re-Evaluation 02/26/15   PT Start Time 1115   PT Stop Time 1200   PT Time Calculation (min) 45 min   Activity Tolerance Patient tolerated treatment well   Behavior During Therapy [facility] for tasks assessed/performed      Past Medical History  Diagnosis Date  . Streptococcal meningitis   . Endometriosis of fallopian tube   . Blood transfusion, without reported diagnosis   . Elevated blood pressure reading without diagnosis of hypertension   . Undiagnosed cardiac murmurs   . Depressive disorder, not elsewhere classified   . Unspecified personal history presenting hazards to health   . Anemia, unspecified   . Irritable bowel syndrome     Past Surgical History  Procedure Laterality Date  . Open heart surgery  12-1994    asd repair  . Dilation and curettage of uterus    . Laparoscopic endometriosis fulguration  10-1999    endometriosis    There were no vitals filed for this visit.  Visit Diagnosis:  Cervicalgia  Decreased ROM of neck      Subjective Assessment - 02/07/15 1123    Subjective  pt relates she was working a lot yesterdy and her upper left trap and neck feels tighter and "more tense today" compared to yesterday.  pt reports yesteray was one of her best days because she was able to play some softball.  pt relates pain is 5/10 in left shoulder/neck area and numbnes in middle of  her neck.    Limitations Lifting   Patient Stated Goals decrease neck pain    Currently in Pain? Yes   Pain Score 5    Pain Location Neck   Pain Orientation Left    Pain Onset More than a month ago      Manual Therapy   Distractions in supine grade II-III well tolerated and reduced her symptoms x 3 bouts for 1 minute each, which resulted in decrease in pain Central P-As grade III-IV to her thoracic spine in prone and cervical spine, tightness noted throughout her cervical and thoracic spine with mild decrease of symptoms  Soft tissue mobilization to left sided UT, with decrease in pain and tension noted after  Mechanical Traction Intermittent Cervical traction with 10 seconds off with 5# and 20 seconds on with 15# for 10 minutes                         PT Education - 02/07/15 1507    Education provided Yes   Education Details plan of care and benefits of mechanical traction    Person(s) Educated Patient   Methods Explanation   Comprehension Verbalized understanding             PT Long Term Goals - 01/29/15 1114    PT LONG TERM GOAL #1   Title pt's left grip strength will improve by 20# in order to better preform functional activites at work    Baseline left grip strength: 30#   Time 4   Period  Weeks   Status New   PT LONG TERM GOAL #2   Title pt left cervical rotation will improve by 10 degrees with less than 2/10 pain in order to improve driving   Baseline left cervical rotation 40 degrees    Time 4   Period Weeks   Status New   PT LONG TERM GOAL #3   Title pt will be able to swing bat with less than 2/10 pain in order to return to prior level of function   Baseline not playing softball currently due to neck pain    Time 4   Period Weeks   Status New               Plan - 02/07/15 1509    Clinical Impression Statement Patient responded well to cervical distraction and central PAs from 5/10 to 1-2/10 and reports having increased left cervical rotation ROM. pt tolerated mechanical traction well today for 10 minutes wthout increase in symptoms.     Pt will benefit from skilled therapeutic intervention  in order to improve on the following deficits Decreased range of motion;Pain;Decreased strength;Impaired sensation;Postural dysfunction   Rehab Potential Good   PT Frequency 2x / week   PT Duration 4 weeks   PT Treatment/Interventions Aquatic Therapy;ADLs/Self Care Home Management;Biofeedback;Iontophoresis 4mg /ml Dexamethasone;Therapeutic exercise;Therapeutic activities;Functional mobility training;Traction;Neuromuscular re-education;Patient/family education;Manual techniques;Passive range of motion   PT Next Visit Plan Cervical distraction, lower trapezius strengthening, and ADL/work related task training without flexing her neck.    Consulted and Agree with Plan of Care Patient        Problem List Patient Active Problem List   Diagnosis Date Noted  . Chest pain 11/05/2014  . Arm numbness left 11/05/2014  . Right upper quadrant pain, chronic 02/26/2011  . Rash 02/26/2011  . COLONIC POLYPS 06/30/2010  . B12 DEFICIENCY 06/30/2010  . ANEMIA 06/30/2010  . DEPRESSION 06/30/2010  . Hx of MENINGITIS 06/30/2010  . ENDOMETRIOSIS 06/30/2010  . CARDIAC MURMUR 06/30/2010  . PAP SMEAR, ABNORMAL 06/30/2010  . CHICKENPOX, HX OF 06/30/2010   Janus Molder, SPT  This entire session was performed under direct supervision and direction of a licensed therapist/therapist assistant . I have personally read, edited and approve of the note as written. Carlyon Shadow. Tortorici, PT, DPT [phone]  Tortorici,Ashley 02/07/2015, 5:26 PM  [facility] MAIN [facility] SERVICES [address] [place], [place], [address] Phone: [phone]   Fax:  [phone]

## 2015-02-11 ENCOUNTER — Ambulatory Visit: Payer: BLUE CROSS/BLUE SHIELD

## 2015-02-14 ENCOUNTER — Ambulatory Visit: Payer: BLUE CROSS/BLUE SHIELD

## 2015-02-20 ENCOUNTER — Encounter: Payer: Self-pay | Admitting: Family Medicine

## 2015-02-20 ENCOUNTER — Ambulatory Visit (INDEPENDENT_AMBULATORY_CARE_PROVIDER_SITE_OTHER): Payer: BLUE CROSS/BLUE SHIELD | Admitting: Family Medicine

## 2015-02-20 ENCOUNTER — Ambulatory Visit: Payer: BLUE CROSS/BLUE SHIELD

## 2015-02-20 VITALS — BP 130/68 | HR 104 | Temp 99.0°F | Ht 64.5 in | Wt 149.2 lb

## 2015-02-20 DIAGNOSIS — M5412 Radiculopathy, cervical region: Secondary | ICD-10-CM

## 2015-02-20 MED ORDER — PREGABALIN 75 MG PO CAPS: 75.0000 mg | ORAL_CAPSULE | Freq: Two times a day (BID) | ORAL | Status: DC

## 2015-02-20 MED ORDER — PREDNISONE 20 MG PO TABS: ORAL_TABLET | ORAL | Status: DC

## 2015-02-20 NOTE — Progress Notes (Signed)
Dr. Karleen Hampshire T. Elley Harp, MD, CAQ Sports Medicine Primary Care and Sports Medicine [address] [place], [address] Phone: [phone] Fax: [phone]  02/20/2015  Patient: Deborah Navarro, MRN: [id number], DOB: 03/29/1981, 34 y.o.  Primary Physician:  Kerby Nora, MD  Chief Complaint: Follow-up  Subjective:   Deborah Navarro is a 34 y.o. very pleasant female patient who presents with the following:  Worse, almost in constant pain, pain after PT and in the R neck and down in the shoulder.   She is still having quite a bit of pain in her neck, but her neurological symptoms, weakness, and decreased sensation have all gone away.  She is sleeping better at night.  She has been very compliant with doing her home exercise program and her range of motion is a lot better.  01/16/2015 Last OV with Hannah Beat, MD  Cervical radiculopathy, not really much improved. A lot of pushing and pulling. She does seem to be a little bit better at the beginning, but over time her symptoms have returned and she still is continuing to have radicular symptoms down the left side of her neck. She is working quite a bit, and she is working at [employer] now full-time.  Elavil  Ashley Torticollis - PT  12/20/2014 Last OV with Hannah Beat, MD  Neck is hurting all on the l side. Started back into April, and underneath into the shoulder blade.  Goes from the L side of her head and down to the neck and into the left arm.   Stopped playing softball.  Will get a little better on Friday, then worse with working.  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  Had been playing softball 2-3 days a week.   RHD.   Ibuprofen and Alleve - some stomach discomfort.  Some heat and ice.  Muscle relaxers will help a little.   1-2 L fingers more dorsum Top of forearm L   Past Medical History, Surgical History, Social History, Family History, Problem List, Medications, and Allergies have been reviewed  and updated if relevant.  Patient Active Problem List   Diagnosis Date Noted  . Chest pain 11/05/2014  . Arm numbness left 11/05/2014  . Right upper quadrant pain, chronic 02/26/2011  . Rash 02/26/2011  . COLONIC POLYPS 06/30/2010  . B12 DEFICIENCY 06/30/2010  . ANEMIA 06/30/2010  . DEPRESSION 06/30/2010  . Hx of MENINGITIS 06/30/2010  . ENDOMETRIOSIS 06/30/2010  . CARDIAC MURMUR 06/30/2010  . PAP SMEAR, ABNORMAL 06/30/2010  . CHICKENPOX, HX OF 06/30/2010    Past Medical History  Diagnosis Date  . Streptococcal meningitis   . Endometriosis of fallopian tube   . Blood transfusion, without reported diagnosis   . Elevated blood pressure reading without diagnosis of hypertension   . Undiagnosed cardiac murmurs   . Depressive disorder, not elsewhere classified   . Unspecified personal history presenting hazards to health   . Anemia, unspecified   . Irritable bowel syndrome     Past Surgical History  Procedure Laterality Date  . Open heart surgery  12-1994    asd repair  . Dilation and curettage of uterus    . Laparoscopic endometriosis fulguration  10-1999    endometriosis    Social History   Social History  . Marital Status: Married    Spouse Name: N/A  . Number of Children: 1  . Years of Education: N/A   Occupational History  . customer Leisure centre manager [employer]  Social History Main Topics  . Smoking status: Never Smoker   . Smokeless tobacco: Never Used  . Alcohol Use: 0.6 oz/week    1 Shots of liquor per week  . Drug Use: No  . Sexual Activity: Not on file   Other Topics Concern  . Not on file   Social History Narrative   Regular exercise--yes softball, some walking   Diet: Moderate, some water, picky eater             Family History  Problem Relation Age of Onset  . Cancer Mother 64    non hodgkins lymphoma(mets to brain)  . Coronary artery disease Father   . Hypertension Father   . Hyperlipidemia Father   . Diabetes Father     No  Known Allergies  Medication list reviewed and updated in full in [place] [facility].  GEN: no acute illness or fever CV: No chest pain or shortness of breath MSK: detailed above Neuro: neurological signs are described above ROS O/w per HPI  Objective:   BP 130/68 mmHg  Pulse 104  Temp(Src) 99 F (37.2 C) (Oral)  Ht 5' 4.5" (1.638 m)  Wt 149 lb 4 oz (67.699 kg)  BMI 25.23 kg/m2  LMP 02/01/2015   GEN: Well-developed,well-nourished,in no acute distress; alert,appropriate and cooperative throughout examination HEENT: Normocephalic and atraumatic without obvious abnormalities. Ears, externally no deformities PULM: Breathing comfortably in no respiratory distress EXT: No clubbing, cyanosis, or edema PSYCH: Normally interactive. Cooperative during the interview. Pleasant. Friendly and conversant. Not anxious or depressed appearing. Normal, full affect.  CERVICAL SPINE EXAM Range of motion: Flexion, extension, lateral bending, and rotation: approximate 10% loss of extension and flexion and 10% of lateral rotational movements. Pain with terminal motion: yes Spinous Processes: NT SCM: NT Upper paracervical muscles: left posterior cervical and lateral - mild TTP Upper traps:   Sensation and str preserved throughout.   Radiology: CLINICAL DATA: Radiculopathy.  EXAM: CERVICAL SPINE 4+ VIEWS  COMPARISON: None.  FINDINGS: No acute soft tissue bony abnormality identified. Normal alignment. Normal mineralization. Mild degenerative changes C5-C6 with bilateral neural foraminal narrowing at this level . Pulmonary apices are clear. Prior median sternotomy .  IMPRESSION: Degenerative changes C5-C6 with bilateral neural foraminal narrowing at this level. Exam otherwise negative .   Electronically Signed  By: Maisie Fus Register  On: 01/16/2015 12:50  Assessment and Plan:   Left cervical radiculopathy  Radiculopathy has improved.  Other neurological symptoms have  improved.  Continues with pain.  Range of motion is improved.  Pulse of prednisone, taper.  Start Lyrica b.i.d. For now, she would like to be conservative if at all possible.  Follow-up: Return in about 6 weeks (around 04/03/2015).  New Prescriptions   PREDNISONE (DELTASONE) 20 MG TABLET    2 tabs po for 5 days, then 1 tab po for  days   PREGABALIN (LYRICA) 75 MG CAPSULE    Take 1 capsule (75 mg total) by mouth 2 (two) times daily.   No orders of the defined types were placed in this encounter.    Signed,  Elpidio Galea. Ia Leeb, MD   Patient's Medications  New Prescriptions   PREDNISONE (DELTASONE) 20 MG TABLET    2 tabs po for 5 days, then 1 tab po for  days   PREGABALIN (LYRICA) 75 MG CAPSULE    Take 1 capsule (75 mg total) by mouth 2 (two) times daily.  Previous Medications   AMITRIPTYLINE (ELAVIL) 50 MG TABLET    Take 1  tablet (50 mg total) by mouth at bedtime.   IBUPROFEN (ADVIL,MOTRIN) 600 MG TABLET    Take 600 mg by mouth every 6 (six) hours as needed.   TIZANIDINE (ZANAFLEX) 4 MG TABLET    Take 1 tablet (4 mg total) by mouth Nightly.  Modified Medications   No medications on file  Discontinued Medications   No medications on file

## 2015-02-20 NOTE — Progress Notes (Signed)
Pre visit review using our clinic review tool, if applicable. No additional management support is needed unless otherwise documented below in the visit note. 

## 2015-02-20 NOTE — Patient Instructions (Signed)
Lyrica, start 50 mg at night for 1 week, then increase to 50 mg twice a day.  After sample runs out, start 75 mg twice a day

## 2015-03-04 ENCOUNTER — Ambulatory Visit: Payer: BLUE CROSS/BLUE SHIELD

## 2015-03-06 ENCOUNTER — Ambulatory Visit: Payer: BLUE CROSS/BLUE SHIELD

## 2015-03-11 ENCOUNTER — Ambulatory Visit: Payer: BLUE CROSS/BLUE SHIELD

## 2015-03-14 ENCOUNTER — Ambulatory Visit: Payer: BLUE CROSS/BLUE SHIELD

## 2015-03-15 ENCOUNTER — Ambulatory Visit (INDEPENDENT_AMBULATORY_CARE_PROVIDER_SITE_OTHER): Payer: BLUE CROSS/BLUE SHIELD | Admitting: Family Medicine

## 2015-03-15 ENCOUNTER — Other Ambulatory Visit (HOSPITAL_COMMUNITY)
Admission: RE | Admit: 2015-03-15 | Discharge: 2015-03-15 | Disposition: A | Discharge status: Home / Self Care | Payer: BLUE CROSS/BLUE SHIELD | Source: Ambulatory Visit | Attending: Family Medicine | Admitting: Family Medicine

## 2015-03-15 ENCOUNTER — Encounter: Payer: Self-pay | Admitting: Family Medicine

## 2015-03-15 VITALS — BP 120/83 | HR 84 | Temp 98.7°F | Ht 63.75 in | Wt 148.8 lb

## 2015-03-15 DIAGNOSIS — Z1151 Encounter for screening for human papillomavirus (HPV): Secondary | ICD-10-CM | POA: Insufficient documentation

## 2015-03-15 DIAGNOSIS — Z124 Encounter for screening for malignant neoplasm of cervix: Secondary | ICD-10-CM | POA: Diagnosis not present

## 2015-03-15 DIAGNOSIS — Z23 Encounter for immunization: Secondary | ICD-10-CM

## 2015-03-15 DIAGNOSIS — Z13220 Encounter for screening for lipoid disorders: Secondary | ICD-10-CM | POA: Diagnosis not present

## 2015-03-15 DIAGNOSIS — Z01411 Encounter for gynecological examination (general) (routine) with abnormal findings: Secondary | ICD-10-CM | POA: Diagnosis present

## 2015-03-15 DIAGNOSIS — Z0000 Encounter for general adult medical examination without abnormal findings: Secondary | ICD-10-CM

## 2015-03-15 DIAGNOSIS — Z8249 Family history of ischemic heart disease and other diseases of the circulatory system: Secondary | ICD-10-CM | POA: Insufficient documentation

## 2015-03-15 LAB — LIPID PANEL
CHOLESTEROL: 175 mg/dL (ref 0–200)
HDL: 51.8 mg/dL (ref >39.00)
LDL Cholesterol: 108 mg/dL — ABNORMAL HIGH (ref 0–99)
NonHDL: 122.9
Total CHOL/HDL Ratio: 3
Triglycerides: 73 mg/dL (ref 0.0–149.0)
VLDL: 14.6 mg/dL (ref 0.0–40.0)

## 2015-03-15 LAB — COMPREHENSIVE METABOLIC PANEL
ALBUMIN: 3.8 g/dL (ref 3.5–5.2)
ALK PHOS: 43 U/L (ref 39–117)
ALT: 11 U/L (ref 0–35)
AST: 12 U/L (ref 0–37)
BUN: 10 mg/dL (ref 6–23)
CALCIUM: 8.9 mg/dL (ref 8.4–10.5)
CHLORIDE: 104 meq/L (ref 96–112)
CO2: 30 mEq/L (ref 19–32)
Creatinine, Ser: 0.81 mg/dL (ref 0.40–1.20)
GFR: 84.98 mL/min (ref >60.00)
Glucose, Bld: 78 mg/dL (ref 70–99)
POTASSIUM: 4.2 meq/L (ref 3.5–5.1)
Sodium: 137 mEq/L (ref 135–145)
TOTAL PROTEIN: 6.8 g/dL (ref 6.0–8.3)
Total Bilirubin: 0.4 mg/dL (ref 0.2–1.2)

## 2015-03-15 NOTE — Progress Notes (Signed)
Pre visit review using our clinic review tool, if applicable. No additional management support is needed unless otherwise documented below in the visit note. 

## 2015-03-15 NOTE — Progress Notes (Signed)
Subjective:    Patient ID: Deborah Navarro, female    DOB: 1981/01/06, 35 y.o.   MRN: [id number]  HPI  34 year old female presents for wellness exam.  Has been 4 year ago sine she has had CPX. Last Pap smear around 2012. Has had on abnormal pap many years ago, nml since.  Occ discharge, yellowish off and on in past month, none in last few weeks. She has noted  Menses lasting 1-2 days, heavy.   She is being treat for cervical radiculopathy by Dr. Patsy Lager.  Exercise:  playing ball some, cleaning houses Diet: Fruits and veggies, water  Body mass index is 25.74 kg/(m^2).   Social History /Family History/Past Medical History reviewed and updated if needed. UNcle CAD age 8, father with heart disease(40s), diabetes  Review of Systems  Constitutional: Negative for fever and fatigue.  HENT: Negative for ear pain.   Eyes: Negative for pain.  Respiratory: Negative for cough, shortness of breath and wheezing.   Cardiovascular: Negative for chest pain, palpitations and leg swelling.  Gastrointestinal: Positive for constipation. Negative for abdominal pain and diarrhea.  Genitourinary: Negative for dysuria, urgency and hematuria.  Musculoskeletal: Positive for back pain.  Neurological: Negative for headaches.  Psychiatric/Behavioral: Negative for behavioral problems and dysphoric mood. The patient is not nervous/anxious.        Objective:   Physical Exam  Constitutional: Vital signs are normal. She appears well-developed and well-nourished. She is cooperative.  Non-toxic appearance. She does not appear ill. No distress.  HENT:  Head: Normocephalic.  Right Ear: Hearing, tympanic membrane, external ear and ear canal normal.  Left Ear: Hearing, tympanic membrane, external ear and ear canal normal.  Nose: Nose normal.  Eyes: Conjunctivae, EOM and lids are normal. Pupils are equal, round, and reactive to light. Lids are everted and swept, no foreign bodies found.  Neck: Trachea  normal and normal range of motion. Neck supple. Carotid bruit is not present. No thyroid mass and no thyromegaly present.  Cardiovascular: Normal rate, regular rhythm, S1 normal, S2 normal, normal heart sounds and intact distal pulses.  Exam reveals no gallop.   No murmur heard. Pulmonary/Chest: Effort normal and breath sounds normal. No respiratory distress. She has no wheezes. She has no rhonchi. She has no rales.  Abdominal: Soft. Normal appearance and bowel sounds are normal. She exhibits no distension, no fluid wave, no abdominal bruit and no mass. There is no hepatosplenomegaly. There is no tenderness. There is no rebound, no guarding and no CVA tenderness. No hernia.  Genitourinary: Vagina normal and uterus normal. No breast swelling, tenderness, discharge or bleeding. Pelvic exam was performed with patient supine. There is no rash, tenderness or lesion on the right labia. There is no rash, tenderness or lesion on the left labia. Uterus is not enlarged and not tender. Cervix exhibits no motion tenderness, no discharge and no friability. Right adnexum displays no mass, no tenderness and no fullness. Left adnexum displays no mass, no tenderness and no fullness.  Lymphadenopathy:    She has no cervical adenopathy.    She has no axillary adenopathy.  Neurological: She is alert. She has normal strength. No cranial nerve deficit or sensory deficit.  Skin: Skin is warm, dry and intact. No rash noted.  Psychiatric: Her speech is normal and behavior is normal. Judgment normal. Her mood appears not anxious. Cognition and memory are normal. She does not exhibit a depressed mood.          Assessment &  Plan:  The patient's preventative maintenance and recommended screening tests for an annual wellness exam were reviewed in full today. Brought up to date unless services declined.  Counselled on the importance of diet, exercise, and its role in overall health and mortality. The patient's FH and SH was  reviewed, including their home life, tobacco status, and drug and alcohol status.   Vaccines:  Tdap and flu given today. HIV screen: refused,  married Nonsmoker PAP/DVE No early family history of breast cancer  Colonoscopy 2011  Precancerous polyps, plan repeat 5 years. NOw, Dr. Loreta Ave

## 2015-03-15 NOTE — Patient Instructions (Addendum)
Call to schedule colonoscopy repeat with Dr Loreta Ave.  Stop at lab on way out.

## 2015-03-15 NOTE — Addendum Note (Signed)
Addended by: Damita Lack on: 03/15/2015 11:15 AM   Modules accepted: Orders

## 2015-03-18 LAB — CYTOLOGY - PAP

## 2015-03-19 ENCOUNTER — Encounter: Payer: Self-pay | Admitting: *Deleted

## 2015-04-03 ENCOUNTER — Encounter: Payer: Self-pay | Admitting: Family Medicine

## 2015-04-03 ENCOUNTER — Ambulatory Visit (INDEPENDENT_AMBULATORY_CARE_PROVIDER_SITE_OTHER): Payer: BLUE CROSS/BLUE SHIELD | Admitting: Family Medicine

## 2015-04-03 VITALS — BP 110/70 | HR 96 | Temp 98.6°F | Ht 64.5 in | Wt 155.8 lb

## 2015-04-03 DIAGNOSIS — M5412 Radiculopathy, cervical region: Secondary | ICD-10-CM | POA: Diagnosis not present

## 2015-04-03 MED ORDER — TIZANIDINE HCL 4 MG PO TABS: 4.0000 mg | ORAL_TABLET | Freq: Every evening | ORAL | Status: DC

## 2015-04-03 NOTE — Progress Notes (Signed)
Pre visit review using our clinic review tool, if applicable. No additional management support is needed unless otherwise documented below in the visit note. 

## 2015-04-03 NOTE — Patient Instructions (Signed)

## 2015-04-03 NOTE — Progress Notes (Signed)
Dr. Karleen Hampshire T. Mikaelah Trostle, MD, CAQ Sports Medicine Primary Care and Sports Medicine [address] [place], [address] Phone: [phone] Fax: [phone]  04/03/2015  Patient: Deborah Navarro, MRN: [id number], DOB: 12-06-1980, 34 y.o.  Primary Physician:  Kerby Nora, MD  Chief Complaint: Follow-up  Subjective:   Deborah Navarro is a 34 y.o. very pleasant female patient who presents with the following:  Now has gotten worse, Left and down some into the right side also. Gained 7 pounds.  L pain and radicular symptoms have worsened and gone back to where they were, possibly even worse.  She also still has some numbness on that left side.  Compared to her previous evaluation, she also is having some decreased grip and increased fatigue and weakness on the left side in particular.  Wt Readings from Last 3 Encounters:  04/03/15 155 lb 12 oz (70.648 kg)  03/15/15 148 lb 12 oz (67.473 kg)  02/20/15 149 lb 4 oz (67.699 kg)    Grip fatigue and weakness str decrease B radiculopathy  02/20/2015 Last OV with Hannah Beat, MD  Worse, almost in constant pain, pain after PT and in the R neck and down in the shoulder.   She is still having quite a bit of pain in her neck, but her neurological symptoms, weakness, and decreased sensation have all gone away.  She is sleeping better at night.  She has been very compliant with doing her home exercise program and her range of motion is a lot better.  01/16/2015 Last OV with Hannah Beat, MD  Cervical radiculopathy, not really much improved. A lot of pushing and pulling. She does seem to be a little bit better at the beginning, but over time her symptoms have returned and she still is continuing to have radicular symptoms down the left side of her neck. She is working quite a bit, and she is working at [employer] now full-time.  Elavil  Ashley Torticollis - PT  12/20/2014 Last OV with Hannah Beat, MD  Neck is hurting all on the  l side. Started back into April, and underneath into the shoulder blade.  Goes from the L side of her head and down to the neck and into the left arm.   Stopped playing softball.  Will get a little better on Friday, then worse with working.  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  Had been playing softball 2-3 days a week.   RHD.   Ibuprofen and Alleve - some stomach discomfort.  Some heat and ice.  Muscle relaxers will help a little.   1-2 L fingers more dorsum Top of forearm L   Past Medical History, Surgical History, Social History, Family History, Problem List, Medications, and Allergies have been reviewed and updated if relevant.  Patient Active Problem List   Diagnosis Date Noted  . Family history of early CAD 03/15/2015  . COLONIC POLYPS 06/30/2010  . ANEMIA 06/30/2010  . Depression, major, in remission (HCC) 06/30/2010  . Hx of MENINGITIS 06/30/2010  . ENDOMETRIOSIS 06/30/2010  . CARDIAC MURMUR 06/30/2010  . PAP SMEAR, ABNORMAL 06/30/2010  . CHICKENPOX, HX OF 06/30/2010    Past Medical History  Diagnosis Date  . Streptococcal meningitis   . Endometriosis of fallopian tube   . Blood transfusion, without reported diagnosis   . Elevated blood pressure reading without diagnosis of hypertension   . Undiagnosed cardiac murmurs   . Depressive disorder, not elsewhere classified   . Unspecified  personal history presenting hazards to health   . Anemia, unspecified   . Irritable bowel syndrome     Past Surgical History  Procedure Laterality Date  . Open heart surgery  12-1994    asd repair  . Dilation and curettage of uterus    . Laparoscopic endometriosis fulguration  10-1999    endometriosis    Social History   Social History  . Marital Status: Married    Spouse Name: N/A  . Number of Children: 1  . Years of Education: N/A   Occupational History  . customer service manager [employer]   Social History Main Topics  . Smoking status: Never Smoker   .  Smokeless tobacco: Never Used  . Alcohol Use: 0.6 oz/week    1 Shots of liquor per week  . Drug Use: No  . Sexual Activity: Not on file   Other Topics Concern  . Not on file   Social History Narrative   Regular exercise--yes softball, some walking   Diet: Moderate, some water, picky eater             Family History  Problem Relation Age of Onset  . Cancer Mother 3851    non hodgkins lymphoma(mets to brain)  . Coronary artery disease Father   . Hypertension Father   . Hyperlipidemia Father   . Diabetes Father     No Known Allergies  Medication list reviewed and updated in full in [place] [facility].  GEN: no acute illness or fever CV: No chest pain or shortness of breath MSK: detailed above Neuro: neurological signs are described above ROS O/w per HPI  Objective:   BP 110/70 mmHg  Pulse 96  Temp(Src) 98.6 F (37 C) (Oral)  Ht 5' 4.5" (1.638 m)  Wt 155 lb 12 oz (70.648 kg)  BMI 26.33 kg/m2  LMP 03/30/2015   GEN: Well-developed,well-nourished,in no acute distress; alert,appropriate and cooperative throughout examination HEENT: Normocephalic and atraumatic without obvious abnormalities. Ears, externally no deformities PULM: Breathing comfortably in no respiratory distress EXT: No clubbing, cyanosis, or edema PSYCH: Normally interactive. Cooperative during the interview. Pleasant. Friendly and conversant. Not anxious or depressed appearing. Normal, full affect.  CERVICAL SPINE EXAM Range of motion: Flexion, extension, lateral bending, and rotation: approximate 10% loss of extension and flexion and 10% of lateral rotational movements. Pain with terminal motion: yes Spinous Processes: NT SCM: NT Upper paracervical muscles: left posterior cervical and lateral - mild TTP Upper traps: ttp  Grip 4+/5 on the L Decreased sensation on the L arm relatively diffusely  Radiology: CLINICAL DATA: Radiculopathy.  EXAM: CERVICAL SPINE 4+ VIEWS  COMPARISON:  None.  FINDINGS: No acute soft tissue bony abnormality identified. Normal alignment. Normal mineralization. Mild degenerative changes C5-C6 with bilateral neural foraminal narrowing at this level . Pulmonary apices are clear. Prior median sternotomy .  IMPRESSION: Degenerative changes C5-C6 with bilateral neural foraminal narrowing at this level. Exam otherwise negative .   Electronically Signed  By: Maisie Fushomas Register  On: 01/16/2015 12:50  Assessment and Plan:   Left cervical radiculopathy - Plan: MR Cervical Spine Wo Contrast  Obtain an MRI of the cervical spine without contrast to evaluate for foraminal nerve root encroachment, spinal cord impingement, spinal cord edema or other current ongoing spine and neurological process to explain the patient's decreased strength, decreased sensation and radicular cervical pain.  Failure of traditional conservative management including multiple months of NSAIDs, 2 rounds of oral steroids, cervical spine physical therapy, neuropathic pain agents, and  muscle relaxants along with ongoing home exercise program.  Disposition will depend upon findings on advanced imaging.  New Prescriptions   No medications on file   Modified Medications   Modified Medication Previous Medication   TIZANIDINE (ZANAFLEX) 4 MG TABLET tiZANidine (ZANAFLEX) 4 MG tablet      Take 1 tablet (4 mg total) by mouth Nightly.    Take 1 tablet (4 mg total) by mouth Nightly.   Orders Placed This Encounter  Procedures  . MR Cervical Spine Wo Contrast    Signed,  Kelsie Zaborowski T. Lawanna Cecere, MD   Patient's Medications  New Prescriptions   No medications on file  Previous Medications   AMITRIPTYLINE (ELAVIL) 50 MG TABLET    Take 1 tablet (50 mg total) by mouth at bedtime.   IBUPROFEN (ADVIL,MOTRIN) 600 MG TABLET    Take 600 mg by mouth every 6 (six) hours as needed.   PREGABALIN (LYRICA) 75 MG CAPSULE    Take 1 capsule (75 mg total) by mouth 2 (two) times daily.   Modified Medications   Modified Medication Previous Medication   TIZANIDINE (ZANAFLEX) 4 MG TABLET tiZANidine (ZANAFLEX) 4 MG tablet      Take 1 tablet (4 mg total) by mouth Nightly.    Take 1 tablet (4 mg total) by mouth Nightly.  Discontinued Medications   No medications on file

## 2015-04-13 ENCOUNTER — Ambulatory Visit
Admission: RE | Admit: 2015-04-13 | Discharge: 2015-04-13 | Disposition: A | Discharge status: Home / Self Care | Payer: BLUE CROSS/BLUE SHIELD | Source: Ambulatory Visit | Attending: Family Medicine | Admitting: Family Medicine

## 2015-04-13 DIAGNOSIS — M5412 Radiculopathy, cervical region: Secondary | ICD-10-CM

## 2015-04-17 ENCOUNTER — Other Ambulatory Visit: Payer: Self-pay | Admitting: Family Medicine

## 2015-04-17 DIAGNOSIS — M50223 Other cervical disc displacement at C6-C7 level: Secondary | ICD-10-CM

## 2015-04-17 DIAGNOSIS — M5010 Cervical disc disorder with radiculopathy, unspecified cervical region: Secondary | ICD-10-CM

## 2015-04-17 DIAGNOSIS — M50222 Other cervical disc displacement at C5-C6 level: Secondary | ICD-10-CM

## 2015-04-17 DIAGNOSIS — R202 Paresthesia of skin: Secondary | ICD-10-CM

## 2015-04-23 ENCOUNTER — Telehealth: Payer: Self-pay

## 2015-04-23 NOTE — Telephone Encounter (Signed)
Pt left v/m; pt last seen 04/03/15 with neck pain; pt cannot see neurologist for another month and request pain med from Dr Patsy Lageropland until see neurologist; pt having trouble staying at work due to pain. Pt [facility] [address] [place]

## 2015-04-23 NOTE — Telephone Encounter (Signed)
Tylenol #3, 1 po q 4-6 hours as needed for pain, #40, 1 ref.   You cannot drive while taking the tylenol #3.   For lighter pain medication, she can have these available, too.   Tramadol 50 mg, 1 po q 6 hours prn pain, #40, 2 refills

## 2015-04-24 MED ORDER — TRAMADOL HCL 50 MG PO TABS: 50.0000 mg | ORAL_TABLET | Freq: Four times a day (QID) | ORAL | Status: DC | PRN

## 2015-04-24 MED ORDER — ACETAMINOPHEN-CODEINE #3 300-30 MG PO TABS: ORAL_TABLET | ORAL | Status: DC

## 2015-04-24 NOTE — Telephone Encounter (Signed)
Tylenol #3 and Tramadol called into [facility] [address] [place].  [facility] notified as instructed by telephone.

## 2015-05-15 ENCOUNTER — Other Ambulatory Visit: Payer: Self-pay | Admitting: *Deleted

## 2015-05-15 NOTE — Telephone Encounter (Signed)
Last office visit 04/03/2015 with Dr. Patsy Lageropland.  Last refilled 01/16/2015 for #30 with 3 refills.  Changing to Mail Order Pharmacy.

## 2015-05-16 MED ORDER — AMITRIPTYLINE HCL 50 MG PO TABS: 50.0000 mg | ORAL_TABLET | Freq: Every day | ORAL | Status: DC

## 2015-06-14 HISTORY — PX: NECK SURGERY: SHX720

## 2015-08-29 ENCOUNTER — Ambulatory Visit: Payer: BLUE CROSS/BLUE SHIELD | Attending: Neurological Surgery

## 2015-08-29 DIAGNOSIS — M542 Cervicalgia: Secondary | ICD-10-CM

## 2015-08-29 DIAGNOSIS — R29898 Other symptoms and signs involving the musculoskeletal system: Secondary | ICD-10-CM | POA: Insufficient documentation

## 2015-08-29 NOTE — Patient Instructions (Signed)
[email or web address] Supine cervical rotation x 10 each side Supine shoulder flexion and abduction with wand x 10 Cat camel x 10 T spine extension in sitting x 10  isometric deep neck flexor strengthening 5s x 10 LTR x 10

## 2015-08-29 NOTE — Therapy (Signed)
[facility] [address] [place], [place], [address] Phone: [phone]   Fax:  [phone]  Physical Therapy Evaluation  Patient Details  Name: Deborah RaringBrandi H Crystal MRN: [id number] Date of Birth: Oct 03, 1980 Referring Provider: Danielle DessElsner  Encounter Date: 08/29/2015      PT End of Session - 08/29/15 1818    Visit Number 1   Number of Visits 17   Date for PT Re-Evaluation 09/26/15   PT Start Time 1615   PT Stop Time 1730   PT Time Calculation (min) 75 min   Activity Tolerance Patient tolerated treatment well   Behavior During Therapy [facility] for tasks assessed/performed      Past Medical History  Diagnosis Date  . Streptococcal meningitis   . Endometriosis of fallopian tube   . Blood transfusion, without reported diagnosis   . Elevated blood pressure reading without diagnosis of hypertension   . Undiagnosed cardiac murmurs   . Depressive disorder, not elsewhere classified   . Unspecified personal history presenting hazards to health   . Anemia, unspecified   . Irritable bowel syndrome     Past Surgical History  Procedure Laterality Date  . Open heart surgery  12-1994    asd repair  . Dilation and curettage of uterus    . Laparoscopic endometriosis fulguration  10-1999    endometriosis  . Neck surgery N/A 06/14/15    due to C5/6 C6-7 disc herniation     There were no vitals filed for this visit.  Visit Diagnosis:  Cervicalgia - Plan: PT plan of care cert/re-cert  Decreased ROM of neck - Plan: PT plan of care cert/re-cert      Subjective Assessment - 08/29/15 1526    Subjective pt reports initial insidious onset of pain in April 2016 to the L shoulder blade. this progressed over a period of weeks into L neck pain which then began to radiate into the L arm including weakness, numbness and pain. pt was seen in PT x 3 visits in Aug 2016, but had progressive symptoms and was advised by her physician to get an MRI  which was done in October. pt was found to have C5-6 and C6-7 disc herniation. She underwent a surgical procedure to this area 06/14/15. Pts neurosurgeon reported no complications and good internal fixation. pt now has less numbness/tingling and sharp pain ,as before sugery, but has a great deal of increased stiffness, and constand deep achy pain where she reports is basically from the shoulder blades up bilaterally. pt also reports tightness in the throat.    Limitations Lifting   Patient Stated Goals decrease neck pain    Currently in Pain? Yes   Pain Score 5    Pain Location --  C spine and T spine   Pain Onset More than a month ago            [facility] PT Assessment - 08/29/15 0001    Assessment   Referring Provider Elsner   Onset Date/Surgical Date 06/13/16   Hand Dominance Right   Prior Therapy PT   Precautions   Precautions None   Restrictions   Weight Bearing Restrictions No   Other Position/Activity Restrictions --  no lifting restrictions    Balance Screen   Has the patient fallen in the past 6 months No   Has the patient had a decrease in activity level because of a fear of falling?  No   Is the patient reluctant to leave their  home because of a fear of falling?  No   Home Nurse, mental health Private residence   Living Arrangements Spouse/significant other;Children   Available Help at Discharge Family   Type of Home Mobile home   Home Access Stairs to enter   Entrance Stairs-Number of Steps 6   Entrance Stairs-Rails Left   Home Layout One level   Home Equipment None   Prior Function   Level of Independence Independent;Independent with basic ADLs   Vocation Part time employment;Self employed   Vocation Requirements cleaning         POSTURE/OBSERVATION: Pt is in noacute distress. Forward head posture. Pt has guarded CS, TS and L spine movement. Pt is very slow and guarded with AROM, positioning and transfers  PROM/AROM: Pt has full UE ROM with T  spine pain with overhead shoulder flexion CS flexion: 35 deg Extension : 25 deg R rotation: 38 deg, L rotation 35 deg R sidebend 28 deg, L sidebend 28 deg  Lumbar spine: pt is able to bend  To reach ankles however has very little functional lumbar ROM       STRENGTH:  Graded on a 0-5 scale Muscle Group Left Right  Shoulder flex 4 painful 4 painful  Shoulder Abd 4 4  Shoulder Ext    Shoulder IR/ER 4- 4-  Elbow 4+ 4+  Wrist/hand  40lbs grip 68lbs g  Scapular (rhomboids, mod trap/ lower trap)  3 3  Deep neck flexors  3+                                SENSATION: Reduced C5-6 dermatomes on the L   Palpation Pt is very hypomobile throughout the thoracic and cervical spine. Pt has significant muscle guarding bilaterally, however not extremely painful in the muscles. Pt was tender throughout the spine  + neural tension to median and radial nerves bilaterally  OUTCOME MEASURES: TEST Outcome Interpretation  NDI:     50% Moderate disability      Therex:  Supine cervical rotation x 10 each side Supine shoulder flexion and abduction with wand x 10 Cat camel x 10 T spine extension in sitting x 10  isometric deep neck flexor strengthening 5s x 10 LTR x 10   Pt requires min verbal and tactile cues for proper exercise performance               PT Education - 08/29/15 1817    Education provided Yes   Education Details TNE, plan of care, exam findings   Person(s) Educated Patient   Methods Explanation   Comprehension Verbalized understanding             PT Long Term Goals - 08/29/15 1823    PT LONG TERM GOAL #1   Title pt's left grip strength will improve by 10# in order to better preform functional activites at work    Baseline left grip strength: 40lbs   Time 4   Period Weeks   Status New   PT LONG TERM GOAL #2   Title pt left/R cervical rotation will improve by 10 degrees with less than 5/10 pain in order to improve driving   Baseline left  cervical rotation 35 degrees , R 45 deg   Time 8   Period Weeks   Status New   PT LONG TERM GOAL #3   Title pt will be able to swing bat with less than  2/10 pain in order to return to prior level of function   Baseline not playing softball currently due to neck pain    Time 8   Period Weeks   Status New   PT LONG TERM GOAL #4   Title pt will be independent and compliant with HEP   Time 4   Period Weeks   Status New   PT LONG TERM GOAL #5   Title pt NDI will reduce by 20% disabilty to improve funciton and ability to work    Baseline 50%   Time 8   Period Weeks   Status New               Plan - 08/29/15 1818    Clinical Impression Statement pt presents with history x 1 year of L sided neck pain L UE radiculopathy which progressed into the R side in Aug 2016. pt underwent C5-6 and C6-7 surgery 05/3015. pt seems to have resolving symptoms of nerve root encroachment, but increasing symptoms of stiffness/ hypomobility, muscular discomfort , and weakness. pt did recieve theraputic neuroscience education today regarding the healing process and the physiology of pain. pt would benefit from skilled PT services to improve ROM, strength, pain, flexibility to return to PLOF>    Pt will benefit from skilled therapeutic intervention in order to improve on the following deficits Decreased range of motion;Pain;Decreased strength;Impaired sensation;Postural dysfunction;Hypomobility;Increased muscle spasms;Increased fascial restricitons;Decreased scar mobility;Impaired flexibility   Rehab Potential Good   PT Frequency 2x / week   PT Duration 8 weeks   PT Treatment/Interventions Aquatic Therapy;ADLs/Self Care Home Management;Biofeedback;Iontophoresis /ml Dexamethasone;Therapeutic exercise;Therapeutic activities;Functional mobility training;Traction;Neuromuscular re-education;Patient/family education;Manual techniques;Passive range of motion;Dry needling;Moist Heat;Electrical Stimulation   PT  Next Visit Plan Cervical distraction, lower trapezius strengthening, and ADL/work related task training without flexing her neck.    Consulted and Agree with Plan of Care Patient         Problem List Patient Active Problem List   Diagnosis Date Noted  . Family history of early CAD 03/15/2015  . COLONIC POLYPS 06/30/2010  . ANEMIA 06/30/2010  . Depression, major, in remission (HCC) 06/30/2010  . Hx of MENINGITIS 06/30/2010  . ENDOMETRIOSIS 06/30/2010  . CARDIAC MURMUR 06/30/2010  . PAP SMEAR, ABNORMAL 06/30/2010  . CHICKENPOX, HX OF 06/30/2010   Carlyon Shadow. Analeya Luallen, PT, DPT [phone]  Geana Walts 08/29/2015, 6:28 PM  [facility] MAIN [facility] SERVICES [address] [place], [place], [address] Phone: [phone]   Fax:  [phone]  Name: ADYLENE DLUGOSZ MRN: [id number] Date of Birth: 1981/01/03

## 2015-09-03 ENCOUNTER — Ambulatory Visit: Payer: BLUE CROSS/BLUE SHIELD

## 2015-09-03 DIAGNOSIS — R29898 Other symptoms and signs involving the musculoskeletal system: Secondary | ICD-10-CM

## 2015-09-03 DIAGNOSIS — M542 Cervicalgia: Secondary | ICD-10-CM

## 2015-09-03 NOTE — Therapy (Signed)
[facility] MAIN [facility] SERVICES [address] [place], [place], [address] Phone: [phone]   Fax:  [phone]  Physical Therapy Treatment  Patient Details  Name: Deborah Navarro MRN: [id number] Date of Birth: [birth year] Referring Provider: Danielle Dess  Encounter Date: 09/03/2015      PT End of Session - 09/03/15 1732    Visit Number 2   Number of Visits 17   Date for PT Re-Evaluation 09/26/15   PT Start Time 1430   PT Stop Time 1515   PT Time Calculation (min) 45 min   Activity Tolerance Patient tolerated treatment well   Behavior During Therapy [facility] for tasks assessed/performed      Past Medical History  Diagnosis Date  . Streptococcal meningitis   . Endometriosis of fallopian tube   . Blood transfusion, without reported diagnosis   . Elevated blood pressure reading without diagnosis of hypertension   . Undiagnosed cardiac murmurs   . Depressive disorder, not elsewhere classified   . Unspecified personal history presenting hazards to health   . Anemia, unspecified   . Irritable bowel syndrome     Past Surgical History  Procedure Laterality Date  . Open heart surgery  12-1994    asd repair  . Dilation and curettage of uterus    . Laparoscopic endometriosis fulguration  10-1999    endometriosis  . Neck surgery N/A 06/14/15    due to C5/6 C6-7 disc herniation     There were no vitals filed for this visit.  Visit Diagnosis:  Cervicalgia  Decreased ROM of neck      Subjective Assessment - 09/03/15 1731    Subjective pt reports she has been doing the HEP. she reports she feels like she has a litlte more movement,but her pain seems worse.    Limitations Lifting   Patient Stated Goals decrease neck pain    Currently in Pain? Yes   Pain Score 6    Pain Location Neck  and mid back   Pain Onset More than a month ago      MHP to T spine and and lumbar spine prior to manual therapy x 5 min Myofascial release to T spine  and upper lumbar dorsum x 10 min Soft tissue massage to bilateral periscapular area using effleurage, ischemic release and muscle stripping  Subscapular release and scapular PROM bilaterally  CPA mobs from C2-T12 grade II-III 3 bouts of 15s each level Pt generally hypomobile with many trigger points and fascial restrictions                            PT Education - 09/03/15 1732    Education provided Yes   Education Details may be sore after manual therapy. continue with ROM exercises   Person(s) Educated Patient   Methods Explanation   Comprehension Verbalized understanding             PT Long Term Goals - 08/29/15 1823    PT LONG TERM GOAL #1   Title pt's left grip strength will improve by 10# in order to better preform functional activites at work    Baseline left grip strength: 40lbs   Time 4   Period Weeks   Status New   PT LONG TERM GOAL #2   Title pt left/R cervical rotation will improve by 10 degrees with less than 5/10 pain in order to improve driving   Baseline left cervical rotation 35 degrees ,  R 45 deg   Time 8   Period Weeks   Status New   PT LONG TERM GOAL #3   Title pt will be able to swing bat with less than 2/10 pain in order to return to prior level of function   Baseline not playing softball currently due to neck pain    Time 8   Period Weeks   Status New   PT LONG TERM GOAL #4   Title pt will be independent and compliant with HEP   Time 4   Period Weeks   Status New   PT LONG TERM GOAL #5   Title pt NDI will reduce by 20% disabilty to improve funciton and ability to work    Baseline 50%   Time 8   Period Weeks   Status New               Plan - 09/03/15 1732    Clinical Impression Statement pt had improved myofascial mobility and improved joint mobility following manual therapy. pt did not demonstrate significant pain reduction or ROM improvement following however, but she reports feeling a little less stiff. pt  has extensive myofascial tightness, trigger points and hypomobility of the thoracic and cervical spine bilaterally.    Pt will benefit from skilled therapeutic intervention in order to improve on the following deficits Decreased range of motion;Pain;Decreased strength;Impaired sensation;Postural dysfunction;Hypomobility;Increased muscle spasms;Increased fascial restricitons;Decreased scar mobility;Impaired flexibility   Rehab Potential Good   PT Frequency 2x / week   PT Duration 8 weeks   PT Treatment/Interventions Aquatic Therapy;ADLs/Self Care Home Management;Biofeedback;Iontophoresis 4mg /ml Dexamethasone;Therapeutic exercise;Therapeutic activities;Functional mobility training;Traction;Neuromuscular re-education;Patient/family education;Manual techniques;Passive range of motion;Dry needling;Moist Heat;Electrical Stimulation   PT Next Visit Plan Cervical distraction, lower trapezius strengthening, and ADL/work related task training without flexing her neck.    Consulted and Agree with Plan of Care Patient        Problem List Patient Active Problem List   Diagnosis Date Noted  . Family history of early CAD 03/15/2015  . COLONIC POLYPS 06/30/2010  . ANEMIA 06/30/2010  . Depression, major, in remission (HCC) 06/30/2010  . Hx of MENINGITIS 06/30/2010  . ENDOMETRIOSIS 06/30/2010  . CARDIAC MURMUR 06/30/2010  . PAP SMEAR, ABNORMAL 06/30/2010  . CHICKENPOX, HX OF 06/30/2010   Carlyon ShadowAshley C. Demontae Antunes, PT, DPT [phone]   Matthews Franks 09/03/2015, 5:35 PM  [facility]IN [facility] [address] [place], [place], [address] Phone: [phone]   Fax:  [phone]  Name: Deborah Navarro MRN: [id number] Date of Birth: 08-20-1980

## 2015-09-05 ENCOUNTER — Ambulatory Visit: Payer: BLUE CROSS/BLUE SHIELD

## 2015-09-10 ENCOUNTER — Ambulatory Visit: Payer: BLUE CROSS/BLUE SHIELD

## 2015-09-12 ENCOUNTER — Ambulatory Visit: Payer: BLUE CROSS/BLUE SHIELD

## 2015-09-17 ENCOUNTER — Ambulatory Visit: Payer: BLUE CROSS/BLUE SHIELD

## 2015-09-19 ENCOUNTER — Ambulatory Visit: Payer: BLUE CROSS/BLUE SHIELD

## 2015-09-24 ENCOUNTER — Ambulatory Visit: Payer: BLUE CROSS/BLUE SHIELD | Attending: Neurological Surgery

## 2015-09-24 DIAGNOSIS — M542 Cervicalgia: Secondary | ICD-10-CM | POA: Diagnosis not present

## 2015-09-24 DIAGNOSIS — M5412 Radiculopathy, cervical region: Secondary | ICD-10-CM

## 2015-09-24 NOTE — Therapy (Signed)
[place] MAIN [facility] SERVICES [place], [place], [address] Phone: [phone]   Fax:  [phone]  Physical Therapy Treatment  Patient Details  Name: Deborah Navarro MRN: [id number] Date of Birth: December 05, 1980 Referring Provider: Ellene Route  Encounter Date: 09/24/2015      PT End of Session - 09/24/15 1510    Visit Number 3   Number of Visits 25   Date for PT Re-Evaluation 10/22/15   PT Start Time 2952   PT Stop Time 1500   PT Time Calculation (min) 45 min   Activity Tolerance Patient tolerated treatment well   Behavior During Therapy [facility] for tasks assessed/performed      Past Medical History  Diagnosis Date  . Streptococcal meningitis   . Endometriosis of fallopian tube   . Blood transfusion, without reported diagnosis   . Elevated blood pressure reading without diagnosis of hypertension   . Undiagnosed cardiac murmurs   . Depressive disorder, not elsewhere classified   . Unspecified personal history presenting hazards to health   . Anemia, unspecified   . Irritable bowel syndrome     Past Surgical History  Procedure Laterality Date  . Open heart surgery  12-1994    asd repair  . Dilation and curettage of uterus    . Laparoscopic endometriosis fulguration  10-1999    endometriosis  . Neck surgery N/A 06/14/15    due to C5/6 C6-7 disc herniation     There were no vitals filed for this visit.      Subjective Assessment - 09/24/15 1509    Subjective pt reports she has been quite sick with respiratory infection. she reports her upper back feels so much better, almost no pain. she reports her neck is still painful, but much less and she has improved ROM. she gets L sided HA   Limitations Lifting   Patient Stated Goals decrease neck pain    Currently in Pain? Yes   Pain Score 3    Pain Location --  neck   Pain Onset More than a month ago     extensive manual therapy as follows: AROM progress:  Cervical  flexion: 55 deg Extension 32 deg L sidebend 30 deg R sidebend 37 deg L rotation: 50 deg R rotation 55 deg   unilateral T spine mobs grade III T 1-T12 3 bouts 15s each level each side.    anterior inferior rib mobs (posterior) rib springing bilaterally x 4 min   CPA neck mobs grade III C2-C7 3 bouts 20 s each level  Supine SOR x 4 min  STM and Ischemic trigger point release to L upper trap  Patient received dry needling therapy education and acknowledged understanding of risks and benefits of dry needling therapy prior to receiving treatment. Patient voiced understanding of treatment options and elected to proceed with dry needling therapy.   Trigger point dry needling to L upper trap. Deep ache noted.   intermittant manual traction 10s on x 10s off   Pt reports reduced stiffness following therapy                      PT Education - 09/24/15 1510    Education provided Yes   Education Details dry needling education, upper trap stretch    Person(s) Educated Patient   Methods Explanation   Comprehension Verbalized understanding             PT Long Term Goals - 09/24/15 1515  PT LONG TERM GOAL #1   Title pt's left grip strength will improve by 10# in order to better preform functional activites at work    Baseline left grip strength: 40lbs   Time 4   Period Weeks   Status On-going   PT LONG TERM GOAL #2   Title pt left/R cervical rotation will improve by 10 degrees with less than 5/10 pain in order to improve driving   Baseline left cervical rotation 35 degrees , R 45 deg   Time 8   Period Weeks   Status Achieved   PT LONG TERM GOAL #3   Title pt will be able to swing bat with less than 2/10 pain in order to return to prior level of function   Baseline not playing softball currently due to neck pain    Time 8   Period Weeks   Status Partially Met   PT LONG TERM GOAL #4   Title pt will be independent and compliant with HEP   Time 4   Period Weeks    Status Achieved   PT LONG TERM GOAL #5   Title pt NDI will reduce by 20% disabilty to improve funciton and ability to work    Baseline 50%   Time 8   Period Weeks   Status On-going               Plan - 09/24/15 1512    Clinical Impression Statement pt has not been to PT in almost 3 weeks, but demonstrates improved cervical ROM and much less pain to the T spine and neck. pt T spine myofasica is also more mobile. pt would benefit from continued skilled PT services to further address her deficits and return her function. pt received trigger point dry needling today to the L upper trap which seemed to have referred pain to the L head  and L neck.    Rehab Potential Good   PT Frequency 2x / week   PT Duration 8 weeks   PT Treatment/Interventions Aquatic Therapy;ADLs/Self Care Home Management;Biofeedback;Iontophoresis 44m/ml Dexamethasone;Therapeutic exercise;Therapeutic activities;Functional mobility training;Traction;Neuromuscular re-education;Patient/family education;Manual techniques;Passive range of motion;Dry needling;Moist Heat;Electrical Stimulation   PT Next Visit Plan Cervical distraction, lower trapezius strengthening, and ADL/work related task training without flexing her neck.    Consulted and Agree with Plan of Care Patient      Patient will benefit from skilled therapeutic intervention in order to improve the following deficits and impairments:  Decreased range of motion, Pain, Decreased strength, Impaired sensation, Postural dysfunction, Hypomobility, Increased muscle spasms, Increased fascial restricitons, Decreased scar mobility, Impaired flexibility  Visit Diagnosis: Cervicalgia - Plan: PT plan of care cert/re-cert  Radiculopathy, cervical region - Plan: PT plan of care cert/re-cert     Problem List Patient Active Problem List   Diagnosis Date Noted  . Family history of early CAD 03/15/2015  . COLONIC POLYPS 06/30/2010  . ANEMIA 06/30/2010  . Depression,  major, in remission ([place] 06/30/2010  . Hx of MENINGITIS 06/30/2010  . ENDOMETRIOSIS 06/30/2010  . CARDIAC MURMUR 06/30/2010  . PAP SMEAR, ABNORMAL 06/30/2010  . CHICKENPOX, HX OF 06/30/2010   AGorden Harms Kymir Coles, PT, DPT #[phone] Leiam Hopwood 09/24/2015, 3:21 PM  [facility] [address] [place] [address]Phone: [phone]  Fax:  [phone] Name: BROSALIND GUIDOMRN: [id number] of Birth: [birth year]

## 2015-09-26 ENCOUNTER — Ambulatory Visit: Payer: BLUE CROSS/BLUE SHIELD

## 2015-10-01 ENCOUNTER — Ambulatory Visit: Payer: BLUE CROSS/BLUE SHIELD

## 2015-10-03 ENCOUNTER — Ambulatory Visit: Payer: BLUE CROSS/BLUE SHIELD

## 2015-10-10 ENCOUNTER — Ambulatory Visit: Payer: BLUE CROSS/BLUE SHIELD

## 2015-10-10 DIAGNOSIS — M542 Cervicalgia: Secondary | ICD-10-CM | POA: Diagnosis not present

## 2015-10-10 NOTE — Therapy (Signed)
[place] MAIN [facility] SERVICES [address] [place], [place], [address] Phone: [phone]   Fax:  [phone]  Physical Therapy Treatment  Patient Details  Name: Deborah Navarro MRN: [id number] Date of Birth: 06-13-1981 Referring Provider: Ellene Route  Encounter Date: 10/10/2015      PT End of Session - 10/10/15 1442    Visit Number 4   Number of Visits 25   Date for PT Re-Evaluation 10/22/15   PT Start Time 6433   PT Stop Time 1435   PT Time Calculation (min) 50 min   Activity Tolerance Patient tolerated treatment well   Behavior During Therapy [facility] for tasks assessed/performed      Past Medical History  Diagnosis Date  . Streptococcal meningitis   . Endometriosis of fallopian tube   . Blood transfusion, without reported diagnosis   . Elevated blood pressure reading without diagnosis of hypertension   . Undiagnosed cardiac murmurs   . Depressive disorder, not elsewhere classified   . Unspecified personal history presenting hazards to health   . Anemia, unspecified   . Irritable bowel syndrome     Past Surgical History  Procedure Laterality Date  . Open heart surgery  12-1994    asd repair  . Dilation and curettage of uterus    . Laparoscopic endometriosis fulguration  10-1999    endometriosis  . Neck surgery N/A 06/14/15    due to C5/6 C6-7 disc herniation     There were no vitals filed for this visit.      Subjective Assessment - 10/10/15 1440    Subjective pt reports she feels like she has gone backwards the past week or so. she reports having more headaches, more stiffness and some L arm pain. pt acknowledges that she needs to be more consistant with PT appts. and HEP.    Limitations Lifting   Patient Stated Goals decrease neck pain    Currently in Pain? Yes   Pain Score 5    Pain Location Back   Pain Orientation Lower;Mid   Pain Onset More than a month ago      Manual therapy    Soft tissue massage to  bilateral periscapular area using effleurage, muscle stripping, and IASTM using edge tool Subscapular release and scapular PROM bilaterally  CPA mobs from C2-T12 grade II-IV as tolerated  3 -4 bouts of 30s each level T spine extension over chair 3 bouts 20s with gentle PT assisted oscillations at end range T spine rotation in sitting with gentle over pressure oscillations at end range 30s x 2  Pt generally hypomobile with many trigger points and fascial restrictions   pt reports soreness, but improved "tightness" after therapy                        PT Education - 10/10/15 1441    Education provided Yes   Education Details PROM of the T spine at home with help of husband. into extension and rotation.    Person(s) Educated Patient   Methods Explanation;Demonstration   Comprehension Verbalized understanding             PT Long Term Goals - 09/24/15 1515    PT LONG TERM GOAL #1   Title pt's left grip strength will improve by 10# in order to better preform functional activites at work    Baseline left grip strength: 40lbs   Time 4   Period Weeks   Status On-going  PT LONG TERM GOAL #2   Title pt left/R cervical rotation will improve by 10 degrees with less than 5/10 pain in order to improve driving   Baseline left cervical rotation 35 degrees , R 45 deg   Time 8   Period Weeks   Status Achieved   PT LONG TERM GOAL #3   Title pt will be able to swing bat with less than 2/10 pain in order to return to prior level of function   Baseline not playing softball currently due to neck pain    Time 8   Period Weeks   Status Partially Met   PT LONG TERM GOAL #4   Title pt will be independent and compliant with HEP   Time 4   Period Weeks   Status Achieved   PT LONG TERM GOAL #5   Title pt NDI will reduce by 20% disabilty to improve funciton and ability to work    Baseline 50%   Time 8   Period Weeks   Status On-going               Plan - 10/10/15  1442    Clinical Impression Statement pt contiues to show quite severe hypomobility of the T spine and C spine. her fascia does seem more mobile. discussed with pt being more consistant with hEP and PT apts for better carry over. discussed HEP modification to include exercises to be performed more frequently thorughout the day with assist to help gently improve ROM as tolerated.    Rehab Potential Good   PT Frequency 2x / week   PT Duration 8 weeks   PT Treatment/Interventions Aquatic Therapy;ADLs/Self Care Home Management;Biofeedback;Iontophoresis 45m/ml Dexamethasone;Therapeutic exercise;Therapeutic activities;Functional mobility training;Traction;Neuromuscular re-education;Patient/family education;Manual techniques;Passive range of motion;Dry needling;Moist Heat;Electrical Stimulation   PT Next Visit Plan Cervical distraction, lower trapezius strengthening, and ADL/work related task training without flexing her neck.    Consulted and Agree with Plan of Care Patient      Patient will benefit from skilled therapeutic intervention in order to improve the following deficits and impairments:  Decreased range of motion, Pain, Decreased strength, Impaired sensation, Postural dysfunction, Hypomobility, Increased muscle spasms, Increased fascial restricitons, Decreased scar mobility, Impaired flexibility  Visit Diagnosis: Cervicalgia     Problem List Patient Active Problem List   Diagnosis Date Noted  . Family history of early CAD 03/15/2015  . COLONIC POLYPS 06/30/2010  . ANEMIA 06/30/2010  . Depression, major, in remission ([place] 06/30/2010  . Hx of MENINGITIS 06/30/2010  . ENDOMETRIOSIS 06/30/2010  . CARDIAC MURMUR 06/30/2010  . PAP SMEAR, ABNORMAL 06/30/2010  . CHICKENPOX, HX OF 06/30/2010   AGorden Harms George Haggart, PT, DPT #[id number] Elleen Coulibaly 10/10/2015, 2:44 PM  [place]MAIN [facility] [address] [address]Phone:  [phone]  Fax:  [phone] Name: Deborah LICHTENWALNERMRN: [id number] of Birth: 811-21-1982

## 2015-10-16 ENCOUNTER — Encounter: Payer: Self-pay | Admitting: Physical Therapy

## 2015-10-16 ENCOUNTER — Ambulatory Visit: Payer: BLUE CROSS/BLUE SHIELD | Attending: Neurological Surgery

## 2015-10-16 DIAGNOSIS — M542 Cervicalgia: Secondary | ICD-10-CM | POA: Insufficient documentation

## 2015-10-17 NOTE — Therapy (Signed)
[place] MAIN [facility] SERVICES [place], [place], [address] Phone: [phone]   Fax:  [phone]  Physical Therapy Treatment  Patient Details  Name: Deborah Navarro MRN: [id number] Date of Birth: May 25, 1981 Referring Provider: Ellene Route  Encounter Date: 10/16/2015      PT End of Session - 10/17/15 0810    Visit Number 5   Number of Visits 25   Date for PT Re-Evaluation 10/22/15   PT Start Time 7412   PT Stop Time 1745   PT Time Calculation (min) 60 min   Activity Tolerance Patient tolerated treatment well   Behavior During Therapy [facility] for tasks assessed/performed      Past Medical History  Diagnosis Date  . Streptococcal meningitis   . Endometriosis of fallopian tube   . Blood transfusion, without reported diagnosis   . Elevated blood pressure reading without diagnosis of hypertension   . Undiagnosed cardiac murmurs   . Depressive disorder, not elsewhere classified   . Unspecified personal history presenting hazards to health   . Anemia, unspecified   . Irritable bowel syndrome     Past Surgical History  Procedure Laterality Date  . Open heart surgery  12-1994    asd repair  . Dilation and curettage of uterus    . Laparoscopic endometriosis fulguration  10-1999    endometriosis  . Neck surgery N/A 06/14/15    due to C5/6 C6-7 disc herniation     There were no vitals filed for this visit.      Subjective Assessment - 10/17/15 0809    Subjective pt reports she has been doing the exercises more frequently. she reports she has brief periods of pain relief, but shortly after her pain/ stiffness return.    Limitations Lifting   Patient Stated Goals decrease neck pain    Currently in Pain? Yes   Pain Score 5    Pain Location Back   Pain Onset More than a month ago       manual therapy  STM to periscapular area and paraspinals of T spine/C spine, L spine efflurage, muscle stripping, kneading, ischemic  trigger point release to sub occipitals and L upper trap AISTM using edge tool to periscapular area and lumbar spine paraspinals  Grade III-IV CPA and rotational mobs to T spine 3 bouts 30s  Grade III CPA glides to C spine 3 bouts 30s sub occipital release with gentle traction x 5 min C 2 hypomobile , T spine particularly mid T spine very hypomobile   Therex: Open book x 8 each side Lateral prayer stretch 30s x 2 each side Prone on elbows with gentle over pressure into extension from PT x 2 min T spine extension with gentle over pressure 3 x 10 T spine extension with gentle over pressure at end range  L and R 3x10                        PT Education - 10/17/15 0810    Education provided Yes   Education Details HEP progression    Person(s) Educated Patient   Methods Explanation   Comprehension Verbalized understanding             PT Long Term Goals - 09/24/15 1515    PT LONG TERM GOAL #1   Title pt's left grip strength will improve by 10# in order to better preform functional activites at work    Baseline left grip strength:  40lbs   Time 4   Period Weeks   Status On-going   PT LONG TERM GOAL #2   Title pt left/R cervical rotation will improve by 10 degrees with less than 5/10 pain in order to improve driving   Baseline left cervical rotation 35 degrees , R 45 deg   Time 8   Period Weeks   Status Achieved   PT LONG TERM GOAL #3   Title pt will be able to swing bat with less than 2/10 pain in order to return to prior level of function   Baseline not playing softball currently due to neck pain    Time 8   Period Weeks   Status Partially Met   PT LONG TERM GOAL #4   Title pt will be independent and compliant with HEP   Time 4   Period Weeks   Status Achieved   PT LONG TERM GOAL #5   Title pt NDI will reduce by 20% disabilty to improve funciton and ability to work    Baseline 50%   Time 8   Period Weeks   Status On-going                Plan - 10/17/15 0810    Clinical Impression Statement extensive manual therapy performed on patient today. impproved joint mobility of the C spine was observed. pt does not have much increased pain with grade IV mobilizations, and is quite hypomobile in the T spine. trigger points were palpated in the sub occipitals and the R upper trap. pt had improved ROM following therapy. she reported her pain felt a little better. progressed stretching program today as well.    Rehab Potential Good   PT Frequency 2x / week   PT Duration 8 weeks   PT Treatment/Interventions Aquatic Therapy;ADLs/Self Care Home Management;Biofeedback;Iontophoresis 39m/ml Dexamethasone;Therapeutic exercise;Therapeutic activities;Functional mobility training;Traction;Neuromuscular re-education;Patient/family education;Manual techniques;Passive range of motion;Dry needling;Moist Heat;Electrical Stimulation   PT Next Visit Plan Cervical distraction, lower trapezius strengthening, and ADL/work related task training without flexing her neck.    Consulted and Agree with Plan of Care Patient      Patient will benefit from skilled therapeutic intervention in order to improve the following deficits and impairments:  Decreased range of motion, Pain, Decreased strength, Impaired sensation, Postural dysfunction, Hypomobility, Increased muscle spasms, Increased fascial restricitons, Decreased scar mobility, Impaired flexibility  Visit Diagnosis: Cervicalgia     Problem List Patient Active Problem List   Diagnosis Date Noted  . Family history of early CAD 03/15/2015  . COLONIC POLYPS 06/30/2010  . ANEMIA 06/30/2010  . Depression, major, in remission ([place] 06/30/2010  . Hx of MENINGITIS 06/30/2010  . ENDOMETRIOSIS 06/30/2010  . CARDIAC MURMUR 06/30/2010  . PAP SMEAR, ABNORMAL 06/30/2010  . CHICKENPOX, HX OF 06/30/2010   AGorden Harms Karielle Davidow, PT, DPT #[id number] Mariann Palo 10/17/2015, 8:51 AM  CSudden ValleyMAIN RMethod[facility] SacramentoSERVICES [address]RL[address] [place] [address]Phone: [phone]  Fax:  [phone] Name: BSHAGUANA LOVEMRN: [id number] of Birth: 06/19/1980/10/04

## 2015-10-30 ENCOUNTER — Ambulatory Visit: Payer: BLUE CROSS/BLUE SHIELD

## 2015-10-30 DIAGNOSIS — M542 Cervicalgia: Secondary | ICD-10-CM | POA: Diagnosis not present

## 2015-10-30 NOTE — Therapy (Signed)
[place] MAIN [facility] SERVICES [address] [place], [place], [address] Phone: [phone]   Fax:  [phone]  Physical Therapy Treatment  Patient Details  Name: Deborah Navarro MRN: [id number] Date of Birth: [birth year] Referring Provider: Ellene Route  Encounter Date: 10/30/2015      PT End of Session - 10/30/15 1832    Visit Number 6   Number of Visits 25   Date for PT Re-Evaluation 11/14/15   PT Start Time 1645   PT Stop Time 1725   PT Time Calculation (min) 40 min   Activity Tolerance Patient tolerated treatment well   Behavior During Therapy [facility] for tasks assessed/performed      Past Medical History  Diagnosis Date  . Streptococcal meningitis   . Endometriosis of fallopian tube   . Blood transfusion, without reported diagnosis   . Elevated blood pressure reading without diagnosis of hypertension   . Undiagnosed cardiac murmurs   . Depressive disorder, not elsewhere classified   . Unspecified personal history presenting hazards to health   . Anemia, unspecified   . Irritable bowel syndrome     Past Surgical History  Procedure Laterality Date  . Open heart surgery  12-1994    asd repair  . Dilation and curettage of uterus    . Laparoscopic endometriosis fulguration  10-1999    endometriosis  . Neck surgery N/A 06/14/15    due to C5/6 C6-7 disc herniation     There were no vitals filed for this visit.      Subjective Assessment - 10/30/15 1829    Subjective pt reports her stiffness is improving in the mid back and neck. she reports she feels looking down aggrevates her neck/trap and HA. she also c/o LBP with standing   Limitations Lifting   Patient Stated Goals decrease neck pain    Currently in Pain? Yes   Pain Score 5    Pain Location Neck   Pain Descriptors / Indicators Aching   Pain Onset More than a month ago      therex  Chin tuck + lift 5s x 10 Low row red band 3x10 Upper trap stretch 30s x 2 each  side Levator scap stretch 30s x 2 each side SCM stretch 30x 2 each side Chin tuck with OP x 10 Wall slide with lift off x 10 Diaphragmatic breathing (initiating TA contraction) x 2 min Diaphragmatic breathing (initiating TA contraction) + bridge 2x10                          PT Education - 10/30/15 1832    Education provided Yes   Education Details reducing fwd head posture to improve strain on posterior neck, SO muscles   Person(s) Educated Patient   Methods Explanation;Handout   Comprehension Verbalized understanding             PT Long Term Goals - 09/24/15 1515    PT LONG TERM GOAL #1   Title pt's left grip strength will improve by 10# in order to better preform functional activites at work    Baseline left grip strength: 40lbs   Time 4   Period Weeks   Status On-going   PT LONG TERM GOAL #2   Title pt left/R cervical rotation will improve by 10 degrees with less than 5/10 pain in order to improve driving   Baseline left cervical rotation 35 degrees , R 45 deg   Time 8  Period Weeks   Status Achieved   PT LONG TERM GOAL #3   Title pt will be able to swing bat with less than 2/10 pain in order to return to prior level of function   Baseline not playing softball currently due to neck pain    Time 8   Period Weeks   Status Partially Met   PT LONG TERM GOAL #4   Title pt will be independent and compliant with HEP   Time 4   Period Weeks   Status Achieved   PT LONG TERM GOAL #5   Title pt NDI will reduce by 20% disabilty to improve funciton and ability to work    Baseline 50%   Time 8   Period Weeks   Status On-going               Plan - 10/30/15 1833    Clinical Impression Statement pt tolerated progression of strengthening and stretching program today well. she reported less pain following. pt had trouble initiating TA contraction, some of which is likely due to mulitple abdominal surgeries, but also can be a main contributor to  her LBP. extensive reinforcement of reducing fwd head posture today as well to reduce strain on the posterior neck    Rehab Potential Good   PT Frequency 2x / week   PT Duration 8 weeks   PT Treatment/Interventions Aquatic Therapy;ADLs/Self Care Home Management;Biofeedback;Iontophoresis 105m/ml Dexamethasone;Therapeutic exercise;Therapeutic activities;Functional mobility training;Traction;Neuromuscular re-education;Patient/family education;Manual techniques;Passive range of motion;Dry needling;Moist Heat;Electrical Stimulation   PT Next Visit Plan Cervical distraction, lower trapezius strengthening, and ADL/work related task training without flexing her neck.    Consulted and Agree with Plan of Care Patient      Patient will benefit from skilled therapeutic intervention in order to improve the following deficits and impairments:  Decreased range of motion, Pain, Decreased strength, Impaired sensation, Postural dysfunction, Hypomobility, Increased muscle spasms, Increased fascial restricitons, Decreased scar mobility, Impaired flexibility  Visit Diagnosis: Cervicalgia     Problem List Patient Active Problem List   Diagnosis Date Noted  . Family history of early CAD 03/15/2015  . COLONIC POLYPS 06/30/2010  . ANEMIA 06/30/2010  . Depression, major, in remission ([place] 06/30/2010  . Hx of MENINGITIS 06/30/2010  . ENDOMETRIOSIS 06/30/2010  . CARDIAC MURMUR 06/30/2010  . PAP SMEAR, ABNORMAL 06/30/2010  . CHICKENPOX, HX OF 06/30/2010   AGorden Harms Llewyn Heap, PT, DPT #[id number] Ryden Wainer 10/30/2015, 6:34 PM  CP[place]MAIN RMclean SoutheastSERVICES [address]RR[address] [place] [address]Phone: [phone]  Fax:  [phone] Name: Deborah FOSCOMRN: [id number] of Birth: 8Nov 19, 1982

## 2015-10-30 NOTE — Patient Instructions (Signed)
[email or web address] Chin tuck + lift 5s x 10 Low row red band 3x10 Upper trap stretch 30s x 2 each side Levator scap stretch 30s x 2 each side SCM stretch 30x 2 each side Chin tuck with OP x 10 Wall slide with lift off x 10 Diaphragmatic breathing (initiating TA contraction) x 2 min Diaphragmatic breathing (initiating TA contraction) + bridge 2x10

## 2015-11-04 ENCOUNTER — Ambulatory Visit: Payer: BLUE CROSS/BLUE SHIELD

## 2015-11-06 ENCOUNTER — Ambulatory Visit: Payer: BLUE CROSS/BLUE SHIELD

## 2015-11-13 ENCOUNTER — Ambulatory Visit: Payer: BLUE CROSS/BLUE SHIELD

## 2015-11-13 DIAGNOSIS — M542 Cervicalgia: Secondary | ICD-10-CM

## 2015-11-14 NOTE — Therapy (Signed)
[place] [facility] [address] [place], [place], [address] Phone: [phone]   Fax:  [phone]  Physical Therapy Treatment/DC summary  Patient Details  Name: Deborah RaringBrandi H Marcos MRN: [id number] Date of Birth: Jun 25, 1980 Referring Provider: Danielle DessElsner  Encounter Date: 11/13/2015      PT End of Session - 11/14/15 0926    Visit Number 7   Number of Visits 25   Date for PT Re-Evaluation 11/14/15   PT Start Time 1600   PT Stop Time 1645   PT Time Calculation (min) 45 min   Activity Tolerance Patient tolerated treatment well   Behavior During Therapy [facility] for tasks assessed/performed      Past Medical History  Diagnosis Date  . Streptococcal meningitis   . Endometriosis of fallopian tube   . Blood transfusion, without reported diagnosis   . Elevated blood pressure reading without diagnosis of hypertension   . Undiagnosed cardiac murmurs   . Depressive disorder, not elsewhere classified   . Unspecified personal history presenting hazards to health   . Anemia, unspecified   . Irritable bowel syndrome     Past Surgical History  Procedure Laterality Date  . Open heart surgery  12-1994    asd repair  . Dilation and curettage of uterus    . Laparoscopic endometriosis fulguration  10-1999    endometriosis  . Neck surgery N/A 06/14/15    due to C5/6 C6-7 disc herniation     There were no vitals filed for this visit.      Subjective Assessment - 11/14/15 0924    Subjective pt reports her ROM and pain has made a great deal of improvement. she has had trouble being compliant to PT apts due to a high work load at home. pt reports more activity does increase her pain, but she is happy with progress.    Limitations Lifting   Patient Stated Goals decrease neck pain    Currently in Pain? Yes   Pain Score 3    Pain Location --  neck/upper traps    Pain Descriptors / Indicators Aching;Tightness   Pain Type Chronic pain   Pain  Onset More than a month ago      thereX:  Prone row 2 x 10 Prone shoulder extension 2 x 10 Prone shoulder horiz. Abduction 2  X 10 Prone shoulder flexion 2 x 10 sidelying shoulder ER 2 x 10 sidelying shoulder flexion to 90 deg 2 x 10 Wall slide with lift off  2 x 10                          PT Education - 11/14/15 0926    Education provided Yes   Education Details scapular strengthening progression, POC   Person(s) Educated Patient   Methods Explanation   Comprehension Verbalized understanding             PT Long Term Goals - 11/14/15 0929    PT LONG TERM GOAL #1   Title pt's left grip strength will improve by 10# in order to better preform functional activites at work    Baseline left grip strength: 40lbs   Time 4   Period Weeks   Status Achieved   PT LONG TERM GOAL #2   Title pt left/R cervical rotation will improve by 10 degrees with less than 5/10 pain in order to improve driving   Baseline left cervical rotation 35 degrees , R 45  deg   Time 8   Period Weeks   Status Achieved   PT LONG TERM GOAL #3   Title pt will be able to swing bat with less than 2/10 pain in order to return to prior level of function   Baseline not playing softball currently due to neck pain    Time 8   Period Weeks   Status Achieved   PT LONG TERM GOAL #4   Title pt will be independent and compliant with HEP   Time 4   Period Weeks   Status Achieved   PT LONG TERM GOAL #5   Title pt NDI will reduce by 20% disabilty to improve funciton and ability to work    Baseline 50%   Time 8   Period Weeks   Status Achieved               Plan - 11/14/15 1610    Clinical Impression Statement pt has made good progress regarding her pain, flexibility and ROM since starting PT despite limited visits due to her work schedule and financial restraints. pt has a comprehensive HEP for flexibility, self-joint mobilization, and strengthening. pt would likely benefit from  continued therapy to address her soft tissue and joint hypomobility, however her schedule does not allow this consistantly at this time so carry over effect would be minimal. pt will be DC to independent HEP at this time. pt agrees with this recommendation, but urged to return to PT when her schedule allows if she is still having pain.    Rehab Potential Good   PT Frequency 2x / week   PT Duration 8 weeks   PT Treatment/Interventions Aquatic Therapy;ADLs/Self Care Home Management;Biofeedback;Iontophoresis /ml Dexamethasone;Therapeutic exercise;Therapeutic activities;Functional mobility training;Traction;Neuromuscular re-education;Patient/family education;Manual techniques;Passive range of motion;Dry needling;Moist Heat;Electrical Stimulation   PT Next Visit Plan Cervical distraction, lower trapezius strengthening, and ADL/work related task training without flexing her neck.    Consulted and Agree with Plan of Care Patient      Patient will benefit from skilled therapeutic intervention in order to improve the following deficits and impairments:  Decreased range of motion, Pain, Decreased strength, Impaired sensation, Postural dysfunction, Hypomobility, Increased muscle spasms, Increased fascial restricitons, Decreased scar mobility, Impaired flexibility  Visit Diagnosis: Cervicalgia     Problem List Patient Active Problem List   Diagnosis Date Noted  . Family history of early CAD 03/15/2015  . COLONIC POLYPS 06/30/2010  . ANEMIA 06/30/2010  . Depression, major, in remission (HCC) 06/30/2010  . Hx of MENINGITIS 06/30/2010  . ENDOMETRIOSIS 06/30/2010  . CARDIAC MURMUR 06/30/2010  . PAP SMEAR, ABNORMAL 06/30/2010  . CHICKENPOX, HX OF 06/30/2010  Carlyon Shadow. Kimanh Templeman, PT, DPT [phone]   Mavrik Bynum 11/14/2015, 9:30 AM  [place] MAIN [facility] SERVICES [address] [place], [place], [address] Phone: [phone]   Fax:  [phone]  Name:  CALLAHAN PEDDIE MRN: [id number] Date of Birth: 1980-09-08

## 2015-11-14 NOTE — Patient Instructions (Signed)
[email or web address] Prone row 2 x 10 Prone shoulder extension 2 x 10 Prone shoulder horiz. Abduction 2  X 10 Prone shoulder flexion 2 x 10 sidelying shoulder ER 2 x 10 sidelying shoulder flexion to 90 deg 2 x 10 Wall slide with lift off  2 x 10

## 2015-11-21 ENCOUNTER — Emergency Department: Payer: BLUE CROSS/BLUE SHIELD

## 2015-11-21 ENCOUNTER — Emergency Department
Admission: EM | Admit: 2015-11-21 | Discharge: 2015-11-22 | Disposition: A | Discharge status: Home / Self Care | Payer: BLUE CROSS/BLUE SHIELD | Attending: Emergency Medicine | Admitting: Emergency Medicine

## 2015-11-21 DIAGNOSIS — S66801A Unspecified injury of other specified muscles, fascia and tendons at wrist and hand level, right hand, initial encounter: Secondary | ICD-10-CM

## 2015-11-21 DIAGNOSIS — Z79899 Other long term (current) drug therapy: Secondary | ICD-10-CM | POA: Insufficient documentation

## 2015-11-21 DIAGNOSIS — S66104A Unspecified injury of flexor muscle, fascia and tendon of right ring finger at wrist and hand level, initial encounter: Secondary | ICD-10-CM | POA: Insufficient documentation

## 2015-11-21 DIAGNOSIS — Y9364 Activity, baseball: Secondary | ICD-10-CM | POA: Diagnosis not present

## 2015-11-21 DIAGNOSIS — F3340 Major depressive disorder, recurrent, in remission, unspecified: Secondary | ICD-10-CM | POA: Diagnosis not present

## 2015-11-21 DIAGNOSIS — Z791 Long term (current) use of non-steroidal anti-inflammatories (NSAID): Secondary | ICD-10-CM | POA: Diagnosis not present

## 2015-11-21 DIAGNOSIS — W2107XA Struck by softball, initial encounter: Secondary | ICD-10-CM | POA: Insufficient documentation

## 2015-11-21 DIAGNOSIS — S63294A Dislocation of distal interphalangeal joint of right ring finger, initial encounter: Secondary | ICD-10-CM | POA: Diagnosis not present

## 2015-11-21 DIAGNOSIS — S63254A Unspecified dislocation of right ring finger, initial encounter: Secondary | ICD-10-CM | POA: Diagnosis not present

## 2015-11-21 DIAGNOSIS — Y929 Unspecified place or not applicable: Secondary | ICD-10-CM | POA: Diagnosis not present

## 2015-11-21 DIAGNOSIS — S6991XA Unspecified injury of right wrist, hand and finger(s), initial encounter: Secondary | ICD-10-CM | POA: Diagnosis present

## 2015-11-21 DIAGNOSIS — Y999 Unspecified external cause status: Secondary | ICD-10-CM | POA: Diagnosis not present

## 2015-11-21 DIAGNOSIS — S63285A Dislocation of proximal interphalangeal joint of left ring finger, initial encounter: Secondary | ICD-10-CM | POA: Diagnosis not present

## 2015-11-21 DIAGNOSIS — S63284A Dislocation of proximal interphalangeal joint of right ring finger, initial encounter: Secondary | ICD-10-CM | POA: Diagnosis not present

## 2015-11-21 DIAGNOSIS — S63259A Unspecified dislocation of unspecified finger, initial encounter: Secondary | ICD-10-CM

## 2015-11-21 MED ORDER — LIDOCAINE HCL (PF) 1 % IJ SOLN
10.0000 mL | Freq: Once | INTRAMUSCULAR | Status: DC
  Filled 2015-11-21: qty 10

## 2015-11-21 NOTE — ED Notes (Signed)
Pt in with co deformity to 4th finger and pain to 3rd finger due to injury while playing softball.

## 2015-11-21 NOTE — ED Provider Notes (Signed)
[facility] Emergency Department Provider Note  ____________________________________________  Time seen: Approximately 11:11 PM  I have reviewed the triage vital signs and the nursing notes.   HISTORY  Chief Complaint Finger Injury    HPI Deborah Navarro is a 35 y.o. female who presents emergency department complaining of an injury to the third and fourth digits of the right hand. Patient states that she was playing softball when she went for a pop fly. She states that the ball struck the distal tips of the third and fourth digit. She endorses a deformity to the fourth finger. She endorses pain to the third and fourth digits. She denies any other symptoms or complaints at this time. She has not had any medication for these symptoms prior to arrival.   Past Medical History  Diagnosis Date  . Streptococcal meningitis   . Endometriosis of fallopian tube   . Blood transfusion, without reported diagnosis   . Elevated blood pressure reading without diagnosis of hypertension   . Undiagnosed cardiac murmurs   . Depressive disorder, not elsewhere classified   . Unspecified personal history presenting hazards to health   . Anemia, unspecified   . Irritable bowel syndrome     Patient Active Problem List   Diagnosis Date Noted  . Family history of early CAD 03/15/2015  . COLONIC POLYPS 06/30/2010  . ANEMIA 06/30/2010  . Depression, major, in remission (HCC) 06/30/2010  . Hx of MENINGITIS 06/30/2010  . ENDOMETRIOSIS 06/30/2010  . CARDIAC MURMUR 06/30/2010  . PAP SMEAR, ABNORMAL 06/30/2010  . CHICKENPOX, HX OF 06/30/2010    Past Surgical History  Procedure Laterality Date  . Open heart surgery  12-1994    asd repair  . Dilation and curettage of uterus    . Laparoscopic endometriosis fulguration  10-1999    endometriosis  . Neck surgery N/A 06/14/15    due to C5/6 C6-7 disc herniation     Current Outpatient Rx  Name  Route  Sig  Dispense  Refill   . acetaminophen-codeine (TYLENOL #3) 300-30 MG tablet      Take one tablet by mouth every 4 to 6 hours as needed for pain.   40 tablet   1   . amitriptyline (ELAVIL) 50 MG tablet   Oral   Take 1 tablet (50 mg total) by mouth at bedtime.   90 tablet   0   . ibuprofen (ADVIL,MOTRIN) 600 MG tablet   Oral   Take 600 mg by mouth every 6 (six) hours as needed.         . pregabalin (LYRICA) 75 MG capsule   Oral   Take 1 capsule (75 mg total) by mouth 2 (two) times daily.   60 capsule   3   . tiZANidine (ZANAFLEX) 4 MG tablet   Oral   Take 1 tablet (4 mg total) by mouth Nightly.   30 tablet   3   . traMADol (ULTRAM) 50 MG tablet   Oral   Take 1 tablet (50 mg total) by mouth every 6 (six) hours as needed.   10 tablet   0     Allergies Review of patient's allergies indicates no known allergies.  Family History  Problem Relation Age of Onset  . Cancer Mother 70    non hodgkins lymphoma(mets to brain)  . Coronary artery disease Father   . Hypertension Father   . Hyperlipidemia Father   . Diabetes Father     Social History Social History  Substance Use Topics  . Smoking status: Never Smoker   . Smokeless tobacco: Never Used  . Alcohol Use: 0.6 oz/week    1 Shots of liquor per week     Review of Systems  Constitutional: No fever/chills Cardiovascular: no chest pain. Respiratory: no cough. No SOB. Musculoskeletal: Positive for pain to the third and fourth digit right hand. Skin: Negative for rash, abrasions, lacerations, ecchymosis. Neurological: Negative for headaches, focal weakness or numbness. 10-point ROS otherwise negative.  ____________________________________________   PHYSICAL EXAM:  VITAL SIGNS: ED Triage Vitals  Enc Vitals Group     BP 11/21/15 2136 111/69 mmHg     Pulse Rate 11/21/15 2136 107     Resp 11/21/15 2136 20     Temp 11/21/15 2136 98.5 F (36.9 C)     Temp Source 11/21/15 2136 Oral     SpO2 11/21/15 2136 98 %     Weight  11/21/15 2136 150 lb (68.04 kg)     Height 11/21/15 2136 5\' 4"  (1.626 m)     Head Cir --      Peak Flow --      Pain Score 11/21/15 2137 7     Pain Loc --      Pain Edu? --      Excl. in GC? --      Constitutional: Alert and oriented. Well appearing and in no acute distress. Eyes: Conjunctivae are normal. PERRL. EOMI. Head: Atraumatic. Cardiovascular: Normal rate, regular rhythm. Normal S1 and S2.  Good peripheral circulation. Respiratory: Normal respiratory effort without tachypnea or retractions. Lungs CTAB. Good air entry to the bases with no decreased or absent breath sounds. Musculoskeletal: Full range of motion to all extremities. Patient with deformity noted to the fourth digit. This is medial dorsal angulation. This occurs at the DIP joint. Patient has sensation of the distal fingertip. Cap refill intact. No motion of the distal phalanx but inspection. Patient is nontender to palpation over the medial or proximal phalanx of the fourth digit. No tenderness to palpation over the metacarpal bones. Patient does endorse pain to the third digit. There is slight dorsal angulation of the DIP joint. Upon exam, patient has good extension of the distal phalanx but has limited flexion of the distal phalanx when compared to unaffected side. Patient has tenderness to palpation over the distal phalanx. Cap refill intact 5 digits. Sensation intact times all digits. Neurologic:  Normal speech and language. No gross focal neurologic deficits are appreciated.  Skin:  Skin is warm, dry and intact. No rash noted. Psychiatric: Mood and affect are normal. Speech and behavior are normal. Patient exhibits appropriate insight and judgement.   ____________________________________________   LABS (all labs ordered are listed, but only abnormal results are displayed)  Labs Reviewed - No data to  display ____________________________________________  EKG   ____________________________________________  RADIOLOGY Festus BarrenI, Jonathan D Cuthriell, personally viewed and evaluated these images (plain radiographs) as part of my medical decision making, as well as reviewing the written report by the radiologist.  Dg Hand Complete Right  11/21/2015  CLINICAL DATA:  Status post reduction of fourth PIP dislocation EXAM: RIGHT HAND - COMPLETE 3+ VIEW COMPARISON:  Films from earlier in the same day. FINDINGS: There is been reduction of the dislocation of the fourth PIP joint. No definitive fracture is seen. No other focal abnormality is noted. IMPRESSION: Status post reduction without definitive fracture. Electronically Signed   By: Alcide CleverMark  Lukens M.D.   On: 11/21/2015 23:57   Dg Hand  Complete Right  11/21/2015  CLINICAL DATA:  Right finger injury playing softball tonight. Initial encounter. EXAM: RIGHT HAND - COMPLETE 3+ VIEW COMPARISON:  None. FINDINGS: Dorsal dislocation of the fourth DIP joint. No associated acute fracture. Remote fractures of the fourth and fifth tufts. No opaque foreign body. IMPRESSION: Dorsal dislocation of the fourth DIP joint. Electronically Signed   By: Marnee Spring M.D.   On: 11/21/2015 22:07    ____________________________________________    PROCEDURES  Procedure(s) performed:    Reduction of dislocation Date/Time: 2:52 AM Performed by: Racheal Patches Authorized by: Delorise Royals Cuthriell Consent: Verbal consent obtained. Risks and benefits: risks, benefits and alternatives were discussed Consent given by: patient  Joint: DIP joint fourth digit right hand Reduction technique: Digit is anesthetized using digital block. 6 ML's of 1% lidocaine without epinephrine were administered. Traction was applied to the distal phalanx with manipulation of the distal phalanx back into proper anatomical alignment. This is immediately buddy taped to the third digit.  Postreduction films are ordered and results with good reduction of distal phalanx.         SPLINT APPLICATION Date/Time: 2:52 AM Authorized by: Racheal Patches Consent: Verbal consent obtained. Risks and benefits: risks, benefits and alternatives were discussed Consent given by: patient Splint applied by: Gala Romney, PA-C Location details: 4th digit right hand Splint type: finger splint Supplies used: prefabricated finger splint Post-procedure: The splinted body part was neurovascularly unchanged following the procedure. Patient tolerance: Patient tolerated the procedure well with no immediate complications.        Medications  lidocaine (PF) (XYLOCAINE) 1 % injection 10 mL (not administered)     ____________________________________________   INITIAL IMPRESSION / ASSESSMENT AND PLAN / ED COURSE  Pertinent labs & imaging results that were available during my care of the patient were reviewed by me and considered in my medical decision making (see chart for details).  Patient's diagnosis is consistent with dislocation to the distal phalanx and probable flexor tendon injury to the 3rd digit. 4th digit is reduced as described above. Xray reveals that reduction was in alignment. Finger is splinted here in the department. Patient has reduced flexion of distal phalanx on exam. It is undetermined whether this flexion is reduced due to edema of finger or whether this is a tendon injury. Tendon injury is consistent with MOI with direct impact to distal finger.  Patient will follow up with orthopedics for possible tendon injury.  Patient will be discharged home with prescriptions for limited pain medications.  Patient is given ED precautions to return to the ED for any worsening or new symptoms.     ____________________________________________  FINAL CLINICAL IMPRESSION(S) / ED DIAGNOSES  Final diagnoses:  Finger dislocation, initial encounter  Injury of flexor  tendon of hand, right, initial encounter      NEW MEDICATIONS STARTED DURING THIS VISIT:  Discharge Medication List as of 11/22/2015 12:22 AM          This chart was dictated using voice recognition software/Dragon. Despite best efforts to proofread, errors can occur which can change the meaning. Any change was purely unintentional.    Racheal Patches, PA-C 11/22/15 1610  Loleta Rose, MD 11/25/15 [phone]

## 2015-11-22 ENCOUNTER — Telehealth: Payer: Self-pay

## 2015-11-22 MED ORDER — HYDROCODONE-ACETAMINOPHEN 5-325 MG PO TABS: 1.0000 | ORAL_TABLET | Freq: Three times a day (TID) | ORAL | Status: DC | PRN

## 2015-11-22 MED ORDER — TRAMADOL HCL 50 MG PO TABS: 50.0000 mg | ORAL_TABLET | Freq: Four times a day (QID) | ORAL | Status: DC | PRN

## 2015-11-22 NOTE — Telephone Encounter (Signed)
Alois notified prescription for pain medicine is ready to be picked up.

## 2015-11-22 NOTE — Telephone Encounter (Signed)
Please call in temporary rx for pain to last until sees ORTHO.

## 2015-11-22 NOTE — Telephone Encounter (Signed)
Pt left v/m; [facility]15 for one finger dislocated and another finger with tendon issue; pt was given tramadol and that is not helping pain. Pt has appt with Orthopedic on 11/25/15.Orthopedic will not give med since never seen. Pt wants to know if can get different med for pain until seen on 11/25/15. [facility] [address]

## 2015-11-22 NOTE — Discharge Instructions (Signed)
Cast or Splint Care °Casts and splints support injured limbs and keep bones from moving while they heal. It is important to care for your cast or splint at home.   °HOME CARE INSTRUCTIONS °· Keep the cast or splint uncovered during the drying period. It can take 24 to 48 hours to dry if it is made of plaster. A fiberglass cast will dry in less than 1 hour. °· Do not rest the cast on anything harder than a pillow for the first 24 hours. °· Do not put weight on your injured limb or apply pressure to the cast until your health care provider gives you permission. °· Keep the cast or splint dry. Wet casts or splints can lose their shape and may not support the limb as well. A wet cast that has lost its shape can also create harmful pressure on your skin when it dries. Also, wet skin can become infected. °· Cover the cast or splint with a plastic bag when bathing or when out in the rain or snow. If the cast is on the trunk of the body, take sponge baths until the cast is removed. °· If your cast does become wet, dry it with a towel or a blow dryer on the cool setting only. °· Keep your cast or splint clean. Soiled casts may be wiped with a moistened cloth. °· Do not place any hard or soft foreign objects under your cast or splint, such as cotton, toilet paper, lotion, or powder. °· Do not try to scratch the skin under the cast with any object. The object could get stuck inside the cast. Also, scratching could lead to an infection. If itching is a problem, use a blow dryer on a cool setting to relieve discomfort. °· Do not trim or cut your cast or remove padding from inside of it. °· Exercise all joints next to the injury that are not immobilized by the cast or splint. For example, if you have a long leg cast, exercise the hip joint and toes. If you have an arm cast or splint, exercise the shoulder, elbow, thumb, and fingers. °· Elevate your injured arm or leg on 1 or 2 pillows for the first 1 to 3 days to decrease  swelling and pain. It is best if you can comfortably elevate your cast so it is higher than your heart. °SEEK MEDICAL CARE IF:  °· Your cast or splint cracks. °· Your cast or splint is too tight or too loose. °· You have unbearable itching inside the cast. °· Your cast becomes wet or develops a soft spot or area. °· You have a bad smell coming from inside your cast. °· You get an object stuck under your cast. °· Your skin around the cast becomes red or raw. °· You have new pain or worsening pain after the cast has been applied. °SEEK IMMEDIATE MEDICAL CARE IF:  °· You have fluid leaking through the cast. °· You are unable to move your fingers or toes. °· You have discolored (blue or white), cool, painful, or very swollen fingers or toes beyond the cast. °· You have tingling or numbness around the injured area. °· You have severe pain or pressure under the cast. °· You have any difficulty with your breathing or have shortness of breath. °· You have chest pain. °  °This information is not intended to replace advice given to you by your health care provider. Make sure you discuss any questions you have with your health care   provider.   Document Released: 05/29/2000 Document Revised: 03/22/2013 Document Reviewed: 12/08/2012 Elsevier Interactive Patient Education 2016 [employer]  Finger Dislocation Finger dislocation is the displacement of bones in your finger at the joints. Most commonly, finger dislocation occurs at the proximal interphalangeal joint (the joint closest to your knuckle). Very strong, fibrous tissues (ligaments) and joint capsules connect the three bones of your fingers.  CAUSES Dislocation is caused by a forceful impact. This impact moves these bones off the joint and often tears your ligaments.  SYMPTOMS Symptoms of finger dislocation include:  Deformity of your finger.  Pain, with loss of movement. DIAGNOSIS  Finger dislocation is diagnosed with a physical exam. Often, X-ray exams  are done to see if you have associated injuries, such as bone fractures. TREATMENT  Finger dislocations are treated by putting your bones back into position (reduction) either by manually moving the bones back into place or through surgery. Your finger is then kept in a fixed position (immobilized) with the use of a dressing or splint for a brief period. When your ligament has to be surgically repaired, it needs to be kept in a fixed position with a dressing or splint for 1 to 2 weeks. Because joint stiffness is a long-term complication of finger dislocation, hand exercises or physical therapy to increase the range of motion and to regain strength is usually started as soon as the ligament is healed. Exercises and therapy generally last no more than 3 months. HOME CARE INSTRUCTIONS The following measures can help to reduce pain and speed up the healing process:  Rest your injured joint. Do not move until instructed otherwise by your caregiver. Avoid activities similar to the one that caused your injury.  Apply ice to your injured joint for the first day or 2 after your reduction or as directed by your caregiver. Applying ice helps to reduce inflammation and pain.  Put ice in a plastic bag.  Place a towel between your skin and the bag.  Leave the ice on for 15-20 minutes at a time, every 2 hours while you are awake.  Elevate your hand above your heart as directed by your caregiver to reduce swelling.  Take over-the-counter or prescription medicine for pain as your caregiver instructs you. SEEK IMMEDIATE MEDICAL CARE IF:  Your dressing or splint becomes damaged.  Your pain becomes worse rather than better.  You lose feeling in your finger, or it becomes cold and white. MAKE SURE YOU:  Understand these instructions.  Will watch your condition.  Will get help right away if you are not doing well or get worse.   This information is not intended to replace advice given to you by your  health care provider. Make sure you discuss any questions you have with your health care provider.   Document Released: 05/29/2000 Document Revised: 06/22/2014 Document Reviewed: 10/26/2014 Elsevier Interactive Patient Education 2016 [employer]  Tendon Injury Tendons are strong, cordlike structures that connect muscle to bone. Tendons are made up of woven fibers, like a rope. A tendon injury is a tear (rupture) of the tendon. The rupture may be partial (only a few of the fibers in your tendon rupture) or complete (your entire tendon ruptures). CAUSES  Tendon injuries can be caused by high-stress activities, such as sports. They also can be caused by a repetitive injury or by a single injury from an excessive, rapid force. SYMPTOMS  Symptoms of tendon injury include pain when you move the joint close to the tendon.  Other symptoms are swelling, redness, and warmth. DIAGNOSIS  Tendon injuries often can be diagnosed by physical exam. However, sometimes an X-ray exam or advanced imaging, such as magnetic resonance imaging (MRI), is necessary to determine the extent of the injury. TREATMENT  Partial tendon ruptures often can be treated with immobilization. A splint, bandage, or removable brace usually is used to immobilize the injured tendon. Most injured tendons need to be immobilized for 1-2 months before they are completely healed. Complete tendon ruptures may require surgical reattachment.   This information is not intended to replace advice given to you by your health care provider. Make sure you discuss any questions you have with your health care provider.   Document Released: 07/09/2004 Document Revised: 05/21/2011 Document Reviewed: 08/23/2011 Elsevier Interactive Patient Education [employer]

## 2015-11-25 DIAGNOSIS — S6991XA Unspecified injury of right wrist, hand and finger(s), initial encounter: Secondary | ICD-10-CM | POA: Diagnosis not present

## 2015-11-25 DIAGNOSIS — S63259A Unspecified dislocation of unspecified finger, initial encounter: Secondary | ICD-10-CM | POA: Diagnosis not present

## 2015-12-02 DIAGNOSIS — S6991XA Unspecified injury of right wrist, hand and finger(s), initial encounter: Secondary | ICD-10-CM | POA: Diagnosis not present

## 2015-12-02 DIAGNOSIS — S63259A Unspecified dislocation of unspecified finger, initial encounter: Secondary | ICD-10-CM | POA: Diagnosis not present

## 2015-12-12 ENCOUNTER — Ambulatory Visit: Payer: BLUE CROSS/BLUE SHIELD | Attending: Student | Admitting: Occupational Therapy

## 2015-12-12 DIAGNOSIS — M79644 Pain in right finger(s): Secondary | ICD-10-CM | POA: Insufficient documentation

## 2015-12-12 DIAGNOSIS — R600 Localized edema: Secondary | ICD-10-CM | POA: Insufficient documentation

## 2015-12-12 DIAGNOSIS — M25641 Stiffness of right hand, not elsewhere classified: Secondary | ICD-10-CM | POA: Insufficient documentation

## 2015-12-12 DIAGNOSIS — M6281 Muscle weakness (generalized): Secondary | ICD-10-CM | POA: Insufficient documentation

## 2015-12-12 NOTE — Patient Instructions (Signed)
Contrast ,  PROM of DIP , PIP and blocked intrinsic fist MC flexion  Composite flexion AROM to 4 cm foam block and 2 cm foam block  Ed on doing soft digi sock on digits and ed on doing light coban wrap around 3rd and 4th at night time - but try first prior to night time for 30 min to hour

## 2015-12-12 NOTE — Therapy (Signed)
[facility] PHYSICAL AND SPORTS MEDICINE [address] [place], [place], [address] Phone: [phone]   Fax:  [phone]  Occupational Therapy Treatment  Patient Details  Name: Deborah Navarro MRN: [id number] Date of Birth: 07/30/1980 Referring Provider: Marney DoctorMcGhee  Encounter Date: 12/12/2015      OT End of Session - 12/12/15 1821    Visit Number 1   Number of Visits 6   Date for OT Re-Evaluation 01/02/16   OT Start Time 1350   OT Stop Time 1448   OT Time Calculation (min) 58 min   Activity Tolerance Patient tolerated treatment well   Behavior During Therapy [facility] for tasks assessed/performed      Past Medical History  Diagnosis Date  . Streptococcal meningitis   . Endometriosis of fallopian tube   . Blood transfusion, without reported diagnosis   . Elevated blood pressure reading without diagnosis of hypertension   . Undiagnosed cardiac murmurs   . Depressive disorder, not elsewhere classified   . Unspecified personal history presenting hazards to health   . Anemia, unspecified   . Irritable bowel syndrome     Past Surgical History  Procedure Laterality Date  . Open heart surgery  12-1994    asd repair  . Dilation and curettage of uterus    . Laparoscopic endometriosis fulguration  10-1999    endometriosis  . Neck surgery N/A 06/14/15    due to C5/6 C6-7 disc herniation     There were no vitals filed for this visit.      Subjective Assessment - 12/12/15 1813    Subjective  I hurt my fingers in soft ball,  then got blisters on and between fingers from wrap at ER -  I am working but cannot grip objects, work, soft ball , and family activities    Patient Stated Goals Want to get the use of my hand back - bend my finges , get swelling and pain better    Currently in Pain? 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, likes to play soft ball , read , play with kids    Edema   Edema 3rd > 4 to .7 increase , 4th .4 increaes    Right Hand AROM   R Index  MCP 0-90 90 Degrees   R Index PIP 0-100 100 Degrees   R Long  MCP 0-90 85 Degrees   R Long PIP 0-100 80 Degrees   R Long DIP 0-70 25 Degrees   R Ring  MCP 0-90 85 Degrees   R Ring PIP 0-100 65 Degrees   R Ring DIP 0-70 5 Degrees   R Little  MCP 0-90 90 Degrees   R Little PIP 0-100 100 Degrees        Contrast done - 11 min and then reviewed HEP for  ROM and edema control See pt instruction                  OT Education - 12/12/15 1821    Education provided Yes   Education Details HEP    Person(s) Educated Patient   Methods Explanation;Tactile cues;Demonstration;Verbal cues;Handout   Comprehension Verbal cues required;Returned demonstration;Verbalized understanding          OT Short Term Goals - 12/12/15 1825    OT SHORT TERM GOAL #1   Title AROM in 3rd and 4th digits improve for pt to touch palm to turn doorknob   Baseline PIP's 65 and 80 degrees    Time 2   Period Weeks   Status New   OT SHORT TERM GOAL #2   Title Edema decrease to less than 0.4 to increase AROM to make full fist to hold knife and toothbrush with all digits    Baseline .4 to .7 cm increase    Time 2   Period Weeks   Status New   OT SHORT TERM GOAL #3   Title Pain on PRWHE improve by at least 5 points    Baseline 12/50  on PRWHE and pain at the most 4/10 with attempt to make fist    Time 2   Period Weeks   Status New           OT Long Term Goals - 12/12/15 1828    OT LONG TERM  GOAL #1   Title Grip strength increase in R hand to 50% compare to L to use all digits in  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, environmental job    Baseline NT yet - 3 wks out    Time 4   Period Weeks   Status New   OT LONG TERM GOAL #2   Title Function on PRWHE improve by 10 lbs to carry 10 lbs, turn doorknob, cut with knife    Baseline PRWHE with function at eval 12/50    Time 4   Period Weeks   Status New               Plan - 12/12/15 1822    Clinical Impression Statement Pt is 3 wks out from 4th DIP dislocation , 3rd and 4th sprain - has some irritated skin on 3rd and 4th from coban wrap that caused blisters after her ER visit - pt present with increase edema , pain and decrease AROM and strength in R dominant  hand - limiting her functional use of R hand     Rehab Potential Good   OT Frequency 2x / week   OT Duration 4 weeks   OT Treatment/Interventions Self-care/ADL training;Contrast Bath;Fluidtherapy;Ultrasound;Therapeutic exercise;Patient/family education;Splinting;Manual Therapy;Passive range of motion   Plan assess progress in edema, and ROM    OT Home Exercise Plan See pt instruction    Consulted and Agree with Plan of Care Patient      Patient will benefit from skilled therapeutic intervention in order to improve the following deficits and impairments:  Impaired flexibility, Decreased range of motion, Decreased coordination, Decreased skin integrity, Impaired UE functional use, Pain, Decreased strength  Visit Diagnosis: Stiffness of right hand, not elsewhere classified - Plan: Ot plan of care cert/re-cert  Localized edema - Plan: Ot plan of care cert/re-cert  Pain in right finger(s) - Plan: Ot plan of care cert/re-cert  Muscle weakness (generalized) - Plan: Ot plan of care cert/re-cert    Problem List Patient Active Problem List  Diagnosis Date Noted  . Family history of early CAD 03/15/2015  . COLONIC POLYPS 06/30/2010  . ANEMIA 06/30/2010  .  Depression, major, in remission (HCC) 06/30/2010  . Hx of MENINGITIS 06/30/2010  . ENDOMETRIOSIS 06/30/2010  . CARDIAC MURMUR 06/30/2010  . PAP SMEAR, ABNORMAL 06/30/2010  . CHICKENPOX, HX OF 06/30/2010    Oletta CohnuPreez, Brittiany Wiehe OTR/L,CLT  12/12/2015, 6:41 PM  [place] [facility] [address] [place], [place], [address] Phone: [phone]   Fax:  [phone]  Name: Deborah Navarro MRN: [id number] Date of Birth: 06/21/1980

## 2015-12-18 ENCOUNTER — Ambulatory Visit: Payer: BLUE CROSS/BLUE SHIELD | Attending: Student | Admitting: Occupational Therapy

## 2015-12-18 DIAGNOSIS — R600 Localized edema: Secondary | ICD-10-CM | POA: Diagnosis not present

## 2015-12-18 DIAGNOSIS — M25641 Stiffness of right hand, not elsewhere classified: Secondary | ICD-10-CM | POA: Insufficient documentation

## 2015-12-18 DIAGNOSIS — M79644 Pain in right finger(s): Secondary | ICD-10-CM

## 2015-12-18 DIAGNOSIS — M6281 Muscle weakness (generalized): Secondary | ICD-10-CM | POA: Diagnosis not present

## 2015-12-18 NOTE — Patient Instructions (Addendum)
Same HEP but use heat in contrast  With hand in loose fist for stretch  After PROM do 1 min ice and then 1 min heat with hand in fist   Then AROM composite flexion to 4 cm , 2 cm and one finger  Try for palm  But keep pain under 1-2/10

## 2015-12-18 NOTE — Therapy (Signed)
[facility] PHYSICAL [facility] SP[facility]TS MEDICINE [address] [place], [place], [address] Phone: [phone]   Fax:  [phone]  Occupational Therapy Treatment  Patient Details  Name: Deborah RaringBrandi H Mckenna MRN: [id number] Date of Birth: October 09, 1980 Referring Provider: Marney DoctorMcGhee  Encounter Date: 12/18/2015      OT End of Session - 12/18/15 1048    Visit Number 2   Number of Visits 6   Date for OT Re-Evaluation 01/02/16   OT Start Time 1035   OT Stop Time 1113   OT Time Calculation (min) 38 min   Activity Tolerance Patient tolerated treatment well   Behavior During Therapy [facility] for tasks assessed/performed      Past Medical History  Diagnosis Date  . Streptococcal meningitis   . Endometriosis of fallopian tube   . Blood transfusion, without reported diagnosis   . Elevated blood pressure reading without diagnosis of hypertension   . Undiagnosed cardiac murmurs   . Depressive disorder, not elsewhere classified   . Unspecified personal history presenting hazards to health   . Anemia, unspecified   . Irritable bowel syndrome     Past Surgical History  Procedure Laterality Date  . Open heart surgery  12-1994    asd repair  . Dilation and curettage of uterus    . Laparoscopic endometriosis fulguration  10-1999    endometriosis  . Neck surgery N/A 06/14/15    due to C5/6 C6-7 disc herniation     There were no vitals filed for this visit.      Subjective Assessment - 12/18/15 1043    Subjective  Did exercises - the exercises make my ring finger hurt more - worked it even little in the pool the other day - cannot keep the compression sleeve at night on- I am always hot   Patient Stated Goals Want to get the use of my hand back - bend my finges , get swelling and pain better    Currently in Pain? Yes   Pain Score 3    Pain Location Finger (Comment which one)   Pain Orientation Right   Pain Descriptors / Indicators Aching   Pain Onset 1 to 4 weeks  ago            [facility] - 12/18/15 0001    Right Hand AROM   R Index  MCP 0-90 90 Degrees   R Index PIP 0-100 100 Degrees   R Long  MCP 0-90 90 Degrees   R Long PIP 0-100 85 Degrees   R Long DIP 0-70 35 Degrees   R Ring  MCP 0-90 90 Degrees   R Ring PIP 0-100 75 Degrees   R Ring DIP 0-70 20 Degrees   R Little  MCP 0-90 90 Degrees   R Little PIP 0-100 100 Degrees                  OT Treatments/Exercises (OP) - 12/18/15 0001    RUE Contrast Bath   Time 11 minutes   [facility] edema and increase ROM -  last heat hand in fist       After contrast - last heat digits in flexion  PROM of DIP , PIP  and  intrinsic fist gentle stretch 3rd and 4th  Pain increased to 4/10  Back in heat and ice 3 min   AROM  Blocked intrinsic fist  Blocked post proximal phalanges for full PIP extention  Composite flexion AROM to 4 cm foam block and 2 cm foam block, 1 finger by OT 6 reps each  Composite fist to touch palm - pain 1/10 - v/c for only AROM , not force , 2nd and 5th needed some t/c              OT Education - 12/18/15 1048    Education provided Yes   Education Details HEP   Person(s) Educated Patient   Methods Explanation;Demonstration;Tactile cues;Verbal cues   Comprehension Verbal cues required;Returned demonstration;Verbalized understanding          OT Short Term Goals - 12/12/15 1825    OT SHORT TERM GOAL #1   Title AROM in 3rd and 4th digits improve for pt to touch palm to turn doorknob   Baseline PIP's 65 and 80 degrees    Time 2   Period Weeks   Status New   OT SHORT TERM GOAL #2   Title Edema decrease to less than 0.4 to increase AROM to make full fist to hold knife and toothbrush with all digits    Baseline .4 to .7 cm increase    Time 2   Period Weeks   Status New   OT SHORT TERM GOAL #3   Title Pain on PRWHE improve by at least 5 points    Baseline 12/50  on PRWHE and pain at the most 4/10 with attempt to  make fist    Time 2   Period Weeks   Status New           OT Long Term Goals - 12/12/15 1828    OT LONG TERM GOAL #1   Title Grip strength increase in R hand to 50% compare to L to use all digits in  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, environmentalcleaning job    Baseline NT yet - 3 wks out    Time 4   Period Weeks   Status New   OT LONG TERM GOAL #2   Title Function on PRWHE improve by 10 lbs to carry 10 lbs, turn doorknob, cut with knife    Baseline PRWHE with function at eval 12/50    Time 4   Period Weeks   Status New               Plan - 12/18/15 1049    Clinical Impression Statement Pt did show increase AROM in 3r and 4th digits - report pain increase in 4th during HEP - change some what - showed great progress in session with pain 1/10 - adjusted HEP little - - pt report she cannot wear compressoion at night time - to hot  - will cont to assess    Rehab Potential Good   OT Frequency 2x / week   OT Duration 4 weeks   OT Treatment/Interventions Self-care/ADL training;Contrast Bath;Fluidtherapy;Ultrasound;Therapeutic exercise;Patient/family education;Splinting;Manual Therapy;Passive range of motion   Plan assess progress and edema    OT Home Exercise Plan See pt instruction    Consulted and Agree with Plan of Care Patient      Patient will benefit from skilled therapeutic intervention in order to improve the following deficits and impairments:  Impaired flexibility, Decreased range of motion, Decreased coordination, Decreased skin integrity, Impaired UE functional use, Pain, Decreased strength  Visit Diagnosis: Stiffness of right hand, not elsewhere classified  Localized edema  Pain in right finger(s)  Muscle weakness (generalized)    Problem List Patient Active Problem List   Diagnosis  Date Noted  . Family history of early CAD 03/15/2015  . COLONIC POLYPS 06/30/2010  . ANEMIA 06/30/2010  . Depression, major, in remission (HCC) 06/30/2010  . Hx of  MENINGITIS 06/30/2010  . ENDOMETRIOSIS 06/30/2010  . CARDIAC MURMUR 06/30/2010  . PAP SMEAR, ABNORMAL 06/30/2010  . CHICKENPOX, HX OF 06/30/2010    Oletta Cohn OTR/L,CLT 12/18/2015, 11:18 AM  [place] [facility] PHYSICAL AND SPORTS MEDICINE [address], [place], [address] Phone: [phone]   Fax:  [phone]  Name: LOUAN BASE MRN: [id number] Date of Birth: January 22, 1981

## 2015-12-23 DIAGNOSIS — S63259D Unspecified dislocation of unspecified finger, subsequent encounter: Secondary | ICD-10-CM | POA: Diagnosis not present

## 2015-12-23 DIAGNOSIS — S6991XD Unspecified injury of right wrist, hand and finger(s), subsequent encounter: Secondary | ICD-10-CM | POA: Diagnosis not present

## 2015-12-24 ENCOUNTER — Ambulatory Visit: Payer: BLUE CROSS/BLUE SHIELD | Admitting: Occupational Therapy

## 2015-12-24 DIAGNOSIS — M6281 Muscle weakness (generalized): Secondary | ICD-10-CM

## 2015-12-24 DIAGNOSIS — M79644 Pain in right finger(s): Secondary | ICD-10-CM

## 2015-12-24 DIAGNOSIS — M25641 Stiffness of right hand, not elsewhere classified: Secondary | ICD-10-CM

## 2015-12-24 DIAGNOSIS — R600 Localized edema: Secondary | ICD-10-CM | POA: Diagnosis not present

## 2015-12-24 NOTE — Patient Instructions (Addendum)
Add to HEP  PROM of DIP 3rd and 4th - with  Hold in DIP /PIP flexion stretch - made strap for 3rd to be use if can - 30 sec at time  At the most 2x  - hold 4th 30 sec   pain less than 2-3/10  Followed by old HEP  PROM  PIP and intrinsic fist gentle stretch 3rd and 4th  AROM blocked intrinsic fist  Full fist to palm - work on composite  Pain increased to 3/10

## 2015-12-24 NOTE — Therapy (Signed)
[place] [facility] PHYSICAL [facility] SPORTS MEDICINE [address], [place], [address] Phone: [phone]   Fax:  [phone]  Occupational Therapy Treatment  Patient Details  Name: Deborah Navarro MRN: [id number] Date of Birth: [birth year] Referring Provider: Marney Doctor  Encounter Date: 12/24/2015      OT End of Session - 12/24/15 1455    Visit Number 3   Number of Visits 6   Date for OT Re-Evaluation 01/02/16   OT Start Time 1435   OT Stop Time 1515   OT Time Calculation (min) 40 min   Activity Tolerance Patient tolerated treatment well   Behavior During Therapy [facility] for tasks assessed/performed      Past Medical History  Diagnosis Date  . Streptococcal meningitis   . Endometriosis of fallopian tube   . Blood transfusion, without reported diagnosis   . Elevated blood pressure reading without diagnosis of hypertension   . Undiagnosed cardiac murmurs   . Depressive disorder, not elsewhere classified   . Unspecified personal history presenting hazards to health   . Anemia, unspecified   . Irritable bowel syndrome     Past Surgical History  Procedure Laterality Date  . Open heart surgery  12-1994    asd repair  . Dilation and curettage of uterus    . Laparoscopic endometriosis fulguration  10-1999    endometriosis  . Neck surgery N/A 06/14/15    due to C5/6 C6-7 disc herniation     There were no vitals filed for this visit.      Subjective Assessment - 12/24/15 1451    Patient Stated Goals Want to get the use of my hand back - bend my finges , get swelling and pain better    Currently in Pain? Yes   Pain Score 4    Pain Location Finger (Comment which one)   Pain Orientation Right   Pain Descriptors / Indicators Aching   Pain Type Acute pain            [facility] OT Assessment - 12/24/15 0001    Right Hand AROM   R Index  MCP 0-90 90 Degrees   R Index PIP 0-100 100 Degrees   R Long  MCP 0-90 90 Degrees   R Long PIP 0-100 90  Degrees   R Long DIP 0-70 42 Degrees   R Ring  MCP 0-90 90 Degrees   R Ring PIP 0-100 100 Degrees   R Ring DIP 0-70 25 Degrees   R Little  MCP 0-90 90 Degrees   R Little PIP 0-100 100 Degrees                  OT Treatments/Exercises (OP) - 12/24/15 0001    RUE Fluidotherapy   Number Minutes Fluidotherapy 10 Minutes   RUE Fluidotherapy Location Hand   Comments at [facility] AROm digits flexion , tendon glides -       ROM tendon glides - and measurements taken at [facility]   then fluido   PROM of DIP 3rd and 4th - place and hold for 4th  Hold in DIP /PIP flexion stretch - made strap for 3rd to be use if can - 30 sec at time  At the most 2x  - hold 4th 30 sec   pain less than 2-3/10  PROM  PIP and intrinsic fist gentle stretch 3rd and 4th  AROM blocked intrinsic fist  Full fist to palm - work on composite  Pain increased to  3/10     Extention improve can do AROM full extention at 4th  Did increase edema at end  Ice 5 min             OT Education - 12/24/15 1454    Education provided Yes   Education Details HEP   Person(s) Educated Patient   Methods Explanation;Demonstration;Tactile cues;Verbal cues   Comprehension Verbal cues required;Returned demonstration;Verbalized understanding          OT Short Term Goals - 12/12/15 1825    OT SHORT TERM GOAL #1   Title AROM in 3rd and 4th digits improve for pt to touch palm to turn doorknob   Baseline PIP's 65 and 80 degrees    Time 2   Period Weeks   Status New   OT SHORT TERM GOAL #2   Title Edema decrease to less than 0.4 to increase AROM to make full fist to hold knife and toothbrush with all digits    Baseline .4 to .7 cm increase    Time 2   Period Weeks   Status New   OT SHORT TERM GOAL #3   Title Pain on PRWHE improve by at least 5 points    Baseline 12/50  on PRWHE and pain at the most 4/10 with attempt to make fist    Time 2   Period Weeks   Status New           OT Long Term Goals - 12/12/15  1828    OT LONG TERM GOAL #1   Title Grip strength increase in R hand to 50% compare to L to use all digits in  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, environmental job    Baseline NT yet - 3 wks out    Time 4   Period Weeks   Status New   OT LONG TERM GOAL #2   Title Function on PRWHE improve by 10 lbs to carry 10 lbs, turn doorknob, cut with knife    Baseline PRWHE with function at eval 12/50    Time 4   Period Weeks   Status New               Plan - 12/24/15 1456    Clinical Impression Statement Pt did see MD yesteray - to cont with OT - pt made progress since last time - because of her work she do have increase edema and pain at times - but was able to touch palm this date with flat fist - showed increase flexion during session too with increase  DIP flexoin - pt 4 1/2 wks post  injury    Rehab Potential Good   OT Frequency 2x / week   OT Duration 4 weeks   OT Treatment/Interventions Self-care/ADL training;Contrast Bath;Fluidtherapy;Ultrasound;Therapeutic exercise;Patient/family education;Splinting;Manual Therapy;Passive range of motion   Plan assess progress in ROM and update EHP    OT Home Exercise Plan See pt instruction    Consulted and Agree with Plan of Care Patient      Patient will benefit from skilled therapeutic intervention in order to improve the following deficits and impairments:  Impaired flexibility, Decreased range of motion, Decreased coordination, Decreased skin integrity, Impaired UE functional use, Pain, Decreased strength  Visit Diagnosis: Stiffness of right hand, not elsewhere classified  Localized edema  Pain in right finger(s)  Muscle weakness (generalized)    Problem List Patient Active Problem List   Diagnosis Date Noted  . Family history of early CAD 03/15/2015  .  COLONIC POLYPS 06/30/2010  . ANEMIA 06/30/2010  . Depression, major, in remission (HCC) 06/30/2010  . Hx of MENINGITIS 06/30/2010  . ENDOMETRIOSIS 06/30/2010  .  CARDIAC MURMUR 06/30/2010  . PAP SMEAR, ABNORMAL 06/30/2010  . CHICKENPOX, HX OF 06/30/2010    Oletta CohnuPreez, Darold Miley OTR/L,CLT  12/24/2015, 7:01 PM  [place] [facility] [address] [place], [place], [address] Phone: [phone]   Fax:  [phone]  Name: Holley RaringBrandi H Linnemann MRN: [id number] Date of Birth: [birth year]

## 2015-12-26 ENCOUNTER — Ambulatory Visit: Payer: BLUE CROSS/BLUE SHIELD | Admitting: Occupational Therapy

## 2015-12-26 DIAGNOSIS — R600 Localized edema: Secondary | ICD-10-CM

## 2015-12-26 DIAGNOSIS — M79644 Pain in right finger(s): Secondary | ICD-10-CM

## 2015-12-26 DIAGNOSIS — M25641 Stiffness of right hand, not elsewhere classified: Secondary | ICD-10-CM

## 2015-12-26 DIAGNOSIS — M6281 Muscle weakness (generalized): Secondary | ICD-10-CM

## 2015-12-26 NOTE — Therapy (Signed)
[facility] [address] [place], [address] Phone: [phone]   Fax:  [phone]  Occupational Therapy Treatment  Patient Details  Name: Deborah Navarro MRN: [id number] Date of Birth: 08-10-1980 Referring Provider: Marney Doctor  Encounter Date: 12/26/2015      OT End of Session - 12/26/15 1638    Visit Number 4   Number of Visits 6   Date for OT Re-Evaluation 01/02/16   OT Start Time 1540   OT Stop Time 1625   OT Time Calculation (min) 45 min   Activity Tolerance Patient tolerated treatment well   Behavior During Therapy [facility] for tasks assessed/performed      Past Medical History  Diagnosis Date  . Streptococcal meningitis   . Endometriosis of fallopian tube   . Blood transfusion, without reported diagnosis   . Elevated blood pressure reading without diagnosis of hypertension   . Undiagnosed cardiac murmurs   . Depressive disorder, not elsewhere classified   . Unspecified personal history presenting hazards to health   . Anemia, unspecified   . Irritable bowel syndrome     Past Surgical History  Procedure Laterality Date  . Open heart surgery  12-1994    asd repair  . Dilation and curettage of uterus    . Laparoscopic endometriosis fulguration  10-1999    endometriosis  . Neck surgery N/A 06/14/15    due to C5/6 C6-7 disc herniation     There were no vitals filed for this visit.      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 Yes   Pain Score 2    Pain Location Finger (Comment which one)   Pain Orientation Right   Pain Descriptors / Indicators  Aching;Tightness   Pain Type Acute pain   Pain Onset 1 to 4 weeks ago            [facility] OT Assessment - 12/26/15 0001    Right Hand AROM   R Long  MCP 0-90 90 Degrees   R Long PIP 0-100 98 Degrees   R Long DIP 0-70 50 Degrees   R Ring  MCP 0-90 90 Degrees   R Ring PIP 0-100 100 Degrees   R Ring DIP 0-70 25 Degrees  PROM 40 during session                  OT Treatments/Exercises (OP) - 12/26/15 0001    RUE Fluidotherapy   Number Minutes Fluidotherapy 10 Minutes   RUE Fluidotherapy Location Hand   Comments AT SOC to increase AROM and decrrease pain       measurements taken at [facility]  then fluido   Graston tool nr 4 on 4th and 3rd  volar digit and over palm - A1 pulley - some tightness -had increase AROM and less tightness for ROM afterwards  Fitted with silicon digisleeve for 4th and 3rd to wear during day on and off and night time for compression to decrease edema and pain   PROM of DIP  4th - place and hold for 4th  Hold in DIP /PIP flexion stretch   At the most 2x - hold 4th and 3rd  30 sec  pain less than 2-3/10  PROM PIP and intrinsic fist gentle stretch 3rd and 4th  AROM blocked intrinsic fist  Full fist to palm - work on composite  Pain increased to 4/10 for 4th     Extention improve can do AROM full extention at 4th  Did increase edema at end  Ice 5 min             OT Education - 12/26/15 1634    Education provided Yes   Education Details HEP update   Person(s) Educated Patient   Methods Explanation;Verbal cues;Tactile cues;Demonstration   Comprehension Verbalized understanding;Returned demonstration;Verbal cues required          OT Short Term Goals - 12/12/15 1825    OT SHORT TERM GOAL #1   Title AROM in 3rd and 4th digits improve for pt to touch palm to turn doorknob   Baseline PIP's 65 and 80 degrees    Time 2   Period Weeks   Status New   OT SHORT TERM GOAL #2   Title Edema decrease to less than 0.4 to  increase AROM to make full fist to hold knife and toothbrush with all digits    Baseline .4 to .7 cm increase    Time 2   Period Weeks   Status New   OT SHORT TERM GOAL #3   Title Pain on PRWHE improve by at least 5 points    Baseline 12/50  on PRWHE and pain at the most 4/10 with attempt to make fist    Time 2   Period Weeks   Status New           OT Long Term Goals - 12/12/15 1828    OT LONG TERM GOAL #1   Title Grip strength increase in R hand to 50% compare to L to use all digits in  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, environmentalcleaning job    Baseline NT yet - 3 wks out    Time 4   Period Weeks   Status New   OT LONG TERM GOAL #2   Title Function on PRWHE improve by 10 lbs to carry 10 lbs, turn doorknob, cut with knife    Baseline PRWHE with function at eval 12/50    Time 4   Period Weeks   Status New               Plan - 12/26/15 1640    Clinical Impression Statement Pt cont to make steady progress- 4th DIP flexion still most pain full and tender as well as DIP /PIP stretch - did fit with silicon sleeves this date that she can use for light compression/allowing good AROM  but also for protection when hitting fingers -    Rehab Potential Good   OT Frequency 2x / week   OT Duration 4 weeks   OT Treatment/Interventions Self-care/ADL training;Contrast Bath;Fluidtherapy;Ultrasound;Therapeutic exercise;Patient/family education;Splinting;Manual Therapy;Passive range of motion   Plan assess pain and ROM - use of silicon compression sleeves    OT Home Exercise Plan See pt instruction    Consulted and Agree with Plan of Care Patient      Patient will benefit from skilled therapeutic intervention in order to improve the following deficits and impairments:  Impaired flexibility, Decreased range of motion, Decreased coordination, Decreased skin  integrity, Impaired UE functional use, Pain, Decreased strength  Visit Diagnosis: Stiffness of right hand, not elsewhere  classified  Localized edema  Pain in right finger(s)  Muscle weakness (generalized)    Problem List Patient Active Problem List   Diagnosis Date Noted  . Family history of early CAD 03/15/2015  . COLONIC POLYPS 06/30/2010  . ANEMIA 06/30/2010  . Depression, major, in remission (HCC) 06/30/2010  . Hx of MENINGITIS 06/30/2010  . ENDOMETRIOSIS 06/30/2010  . CARDIAC MURMUR 06/30/2010  . PAP SMEAR, ABNORMAL 06/30/2010  . CHICKENPOX, HX OF 06/30/2010    Oletta Cohn OTR/L,CLT  12/26/2015, 6:11 PM  [facility]ICINE [address] [place], [address] Phone: [phone]   Fax:  [phone]  Name: Deborah Navarro MRN: [id number] Date of Birth: June 24, 1980

## 2015-12-26 NOTE — Patient Instructions (Addendum)
  Fitted with silicon digisleeve for 4th and 3rd to wear during day on and off and night time for compression to decrease edema and pain   PROM of DIP  4th - place and hold for 4th  Hold in DIP /PIP flexion stretch   At the most 2x - hold 4th and 3rd  30 sec  pain less than 2-3/10  PROM PIP and intrinsic fist gentle stretch 3rd and 4th  AROM blocked intrinsic fist  Full fist to palm - work on [employer]      Ice as needed

## 2015-12-31 ENCOUNTER — Ambulatory Visit: Payer: BLUE CROSS/BLUE SHIELD | Admitting: Occupational Therapy

## 2015-12-31 DIAGNOSIS — R600 Localized edema: Secondary | ICD-10-CM

## 2015-12-31 DIAGNOSIS — M6281 Muscle weakness (generalized): Secondary | ICD-10-CM

## 2015-12-31 DIAGNOSIS — M25641 Stiffness of right hand, not elsewhere classified: Secondary | ICD-10-CM | POA: Diagnosis not present

## 2015-12-31 DIAGNOSIS — M79644 Pain in right finger(s): Secondary | ICD-10-CM

## 2015-12-31 NOTE — Patient Instructions (Addendum)
Add place and hold after PROM of DIP -  Could do 40 degrees - 45 degrees

## 2015-12-31 NOTE — Therapy (Signed)
[place] [facility] [address] [place], [address] Phone: [phone]   Fax:  [phone]  Occupational Therapy Treatment  Patient Details  Name: Deborah Navarro MRN: [id number] Date of Birth: [birth year] Referring Provider: Marney Doctor  Encounter Date: 12/31/2015      OT End of Session - 12/31/15 0747    Visit Number 5   Number of Visits 6   Date for OT Re-Evaluation 01/02/16   OT Start Time 0732   OT Stop Time 0812   OT Time Calculation (min) 40 min   Activity Tolerance Patient tolerated treatment well   Behavior During Therapy [facility] for tasks assessed/performed      Past Medical History  Diagnosis Date  . Streptococcal meningitis   . Endometriosis of fallopian tube   . Blood transfusion, without reported diagnosis   . Elevated blood pressure reading without diagnosis of hypertension   . Undiagnosed cardiac murmurs   . Depressive disorder, not elsewhere classified   . Unspecified personal history presenting hazards to health   . Anemia, unspecified   . Irritable bowel syndrome     Past Surgical History  Procedure Laterality Date  . Open heart surgery  12-1994    asd repair  . Dilation and curettage of uterus    . Laparoscopic endometriosis fulguration  10-1999    endometriosis  . Neck surgery N/A 06/14/15    due to C5/6 C6-7 disc herniation     There were no vitals filed for this visit.      Subjective Assessment - 12/31/15 0742    Subjective  MY fingers is hurting more - but using it more now that  it can bend it more - liting , pushing still hard - writing is easier    Patient Stated Goals Want to get the use of my hand back - bend my finges , get swelling and pain better    Currently in Pain? Yes   Pain Score 4    Pain Location Finger (Comment which one)   Pain Orientation Right   Pain Descriptors / Indicators Aching;Tightness;Shooting   Pain Type Acute pain            [facility] OT  Assessment - 12/31/15 0001    Right Hand AROM   R Long  MCP 0-90 90 Degrees   R Long PIP 0-100 100 Degrees   R Long DIP 0-70 65 Degrees   R Ring  MCP 0-90 90 Degrees   R Ring PIP 0-100 100 Degrees   R Ring DIP 0-70 35 Degrees                  OT Treatments/Exercises (OP) - 12/31/15 0001    RUE Fluidotherapy   Number Minutes Fluidotherapy 10 Minutes   RUE Fluidotherapy Location Hand   Comments AT SOC - increase AROM and decrease pain       measurements taken at [facility]  then fluido   Graston tool nr 4 on 4th and 3rd volar digit at DIP and PIP - lateral sides - some tightness more at 4th than 3rd -had increase AROM and less tightness for ROM afterwards  CONTwith silicon digisleeve for 4th and 3rd to wear during day on and off and night time for compression to decrease edema and pain   PROM of DIP 4th - place and hold for 4th  Hold in DIP /PIP flexion stretch  At the most 2x - hold 4th and 3rd 30  sec  pain less than 4/10  PROM PIP and intrinsic fist gentle stretch 3rd and 4th  AROM blocked intrinsic fist  Place and hold DIP flexion this date at 40 -45 degrees  AROM 37 degrees  Full fist to palm - work on composite  Pain increased to 4/10 for 4th    Extention improve can do AROM full extention at 4th - and tapping   Ice 5 min            OT Education - 12/31/15 0744    Education provided Yes   Education Details HEP   Person(s) Educated Patient   Methods Explanation;Demonstration;Tactile cues;Verbal cues   Comprehension Verbal cues required;Returned demonstration;Verbalized understanding          OT Short Term Goals - 12/12/15 1825    OT SHORT TERM GOAL #1   Title AROM in 3rd and 4th digits improve for pt to touch palm to turn doorknob   Baseline PIP's 65 and 80 degrees    Time 2   Period Weeks   Status New   OT SHORT TERM GOAL #2   Title Edema decrease to less than 0.4 to increase AROM to make full fist to hold knife and  toothbrush with all digits    Baseline .4 to .7 cm increase    Time 2   Period Weeks   Status New   OT SHORT TERM GOAL #3   Title Pain on PRWHE improve by at least 5 points    Baseline 12/50  on PRWHE and pain at the most 4/10 with attempt to make fist    Time 2   Period Weeks   Status New           OT Long Term Goals - 12/12/15 1828    OT LONG TERM GOAL #1   Title Grip strength increase in R hand to 50% compare to L to use all digits in  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, environmental job    Baseline NT yet - 3 wks out    Time 4   Period Weeks   Status New   OT LONG TERM GOAL #2   Title Function on PRWHE improve by 10 lbs to carry 10 lbs, turn doorknob, cut with knife    Baseline PRWHE with function at eval 12/50    Time 4   Period Weeks   Status New               Plan - 12/31/15 0747    Clinical Impression Statement Pt cont to make progress in ROM at digits - pain still increase with use , ROM - 4th DIP capsule tight but improving - pt to cont with PROM and AROM - will start strenthing next session at 6 wks    Rehab Potential Good   OT Frequency 2x / week   OT Duration 4 weeks   OT Treatment/Interventions Self-care/ADL training;Contrast Bath;Fluidtherapy;Ultrasound;Therapeutic exercise;Patient/family education;Splinting;Manual Therapy;Passive range of motion   OT Home Exercise Plan See pt instruction    Consulted and Agree with Plan of Care Patient      Patient will benefit from skilled therapeutic intervention in order to improve the following deficits and impairments:  Impaired flexibility, Decreased range of motion, Decreased coordination, Decreased skin integrity, Impaired UE functional use, Pain, Decreased strength  Visit Diagnosis: Localized edema  Pain in right finger(s)  Muscle weakness (generalized)    Problem List Patient Active Problem List   Diagnosis Date Noted  .  Family history of early CAD 03/15/2015  . COLONIC POLYPS  06/30/2010  . ANEMIA 06/30/2010  . Depression, major, in remission (HCC) 06/30/2010  . Hx of MENINGITIS 06/30/2010  . ENDOMETRIOSIS 06/30/2010  . CARDIAC MURMUR 06/30/2010  . PAP SMEAR, ABNORMAL 06/30/2010  . CHICKENPOX, HX OF 06/30/2010    Oletta CohnuPreez, Erling Arrazola OTR/L,CLT  12/31/2015, 8:15 AM  [place] [facility] [address] [place], [place], [address] Phone: [phone]   Fax:  [phone]  Name: Deborah Navarro MRN: [id number] Date of Birth: 11/23/1980

## 2016-01-02 ENCOUNTER — Ambulatory Visit: Payer: BLUE CROSS/BLUE SHIELD | Admitting: Occupational Therapy

## 2016-01-02 DIAGNOSIS — M25641 Stiffness of right hand, not elsewhere classified: Secondary | ICD-10-CM

## 2016-01-02 DIAGNOSIS — M79644 Pain in right finger(s): Secondary | ICD-10-CM | POA: Diagnosis not present

## 2016-01-02 DIAGNOSIS — M6281 Muscle weakness (generalized): Secondary | ICD-10-CM

## 2016-01-02 DIAGNOSIS — R600 Localized edema: Secondary | ICD-10-CM

## 2016-01-02 NOTE — Therapy (Signed)
[facility] [facility] [address] [place], [place], [address] Phone: [phone]   Fax:  [phone]  Occupational Therapy Treatment  Patient Details  Name: Deborah RaringBrandi H Inabinet MRN: [id number] Date of Birth: 04-Oct-1980 Referring Provider: Marney DoctorMcGhee  Encounter Date: 01/02/2016      OT End of Session - 01/02/16 1913    Visit Number 6   Number of Visits 2   Date for OT Re-Evaluation 01/23/16   OT Start Time 1723   OT Stop Time 1805   OT Time Calculation (min) 42 min   Activity Tolerance Patient tolerated treatment well   Behavior During Therapy [facility] for tasks assessed/performed      Past Medical History  Diagnosis Date  . Streptococcal meningitis   . Endometriosis of fallopian tube   . Blood transfusion, without reported diagnosis   . Elevated blood pressure reading without diagnosis of hypertension   . Undiagnosed cardiac murmurs   . Depressive disorder, not elsewhere classified   . Unspecified personal history presenting hazards to health   . Anemia, unspecified   . Irritable bowel syndrome     Past Surgical History  Procedure Laterality Date  . Open heart surgery  12-1994    asd repair  . Dilation and curettage of uterus    . Laparoscopic endometriosis fulguration  10-1999    endometriosis  . Neck surgery N/A 06/14/15    due to C5/6 C6-7 disc herniation     There were no vitals filed for this visit.      Subjective Assessment - 01/02/16 1910    Subjective  My fingers been more sore - that tip of 4th just tight and that joint is larger - but it will probably be larger from now on - I am using it more and can make fist - but bending that tip hard    Patient Stated Goals Want to get the use of my hand back - bend my finges , get swelling and pain better    Currently in Pain? Yes   Pain Score 3    Pain Location Finger (Comment which one)   Pain Orientation Right   Pain Descriptors / Indicators  Aching;Shooting;Tightness   Pain Type Acute pain   Pain Onset More than a month ago                      OT Treatments/Exercises (OP) - 01/02/16 0001    RUE Fluidotherapy   Number Minutes Fluidotherapy 10 Minutes   RUE Fluidotherapy Location Hand   [facility] and decrease pain in digits 3rd and 4th      measurements taken for grip and prehension strength  Grip R 26, L 55lbs  Lat grip R and L 18 lbs  3 point grip R 11 and L 13 lbs   then fluido   Graston tool nr 4 on 4th and 3rd volar digit at DIP and PIP - lateral sides - some tightness more at 4th than 3rd -had increase AROM and less tightness for ROM afterwards  CONTwith silicon digisleeve for 4th and 3rd to wear during day on and off and night time for compression to decrease edema and pain   PROM of DIP 4th Hold in DIP /PIP flexion stretch  At the most 2x - hold 4th and 3rd 30 sec  pain increase to 5/10 this date PROM PIP and intrinsic fist gentle stretch 3rd and 4th  AROM  blocked intrinsic fist  Place and hold DIP flexion this date at 40 -45 degrees AROM 40 degrees  At end   Full fist to palm - work on composite  Pain increased to 5/10 for 4th                OT Education - 01/02/16 1912    Education provided Yes   Education Details HEP    Person(s) Educated Patient   Methods Explanation;Demonstration;Tactile cues;Verbal cues   Comprehension Verbal cues required;Returned demonstration;Verbalized understanding          OT Short Term Goals - 01/02/16 1915    OT SHORT TERM GOAL #1   Title AROM in 3rd and 4th digits improve for pt to touch palm to turn doorknob   Status Achieved   OT SHORT TERM GOAL #2   Title Edema decrease to less than 0.4 to increase AROM to make full fist to hold knife and toothbrush with all digits    Status Achieved   OT SHORT TERM GOAL #3   Title Pain on PRWHE improve by at least 5 points    Baseline 12/50  on PRWHE and  pain at the most 4/10 with attempt to make fist  - to be assess   Time 1   Period Weeks   Status On-going           OT Long Term Goals - 01/02/16 1916    OT LONG TERM GOAL #1   Title Grip strength increase in R hand to 50% compare to L to use all digits in  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, environmental job    Baseline Grip 26 lbs R , L 55lbs    Time 3   Period Weeks   Status On-going   OT LONG TERM GOAL #2   Title Function on PRWHE improve by 10 lbs to carry 10 lbs, turn doorknob, cut with knife    Baseline PRWHE with function at eval 12/50  - assess next session   Time 2   Period Weeks   Status On-going               Plan - 01/02/16 1914    Clinical Impression Statement Pt grip and prehension strenght assess thist date - AROM at DIP for 4th and 3rd taken - about the same - prehension  better compare to L than grip - pain still up - held off on strenghthening this date    Rehab Potential Good   OT Frequency 2x / week   OT Duration 4 weeks   OT Treatment/Interventions Self-care/ADL training;Contrast Bath;Fluidtherapy;Ultrasound;Therapeutic exercise;Patient/family education;Splinting;Manual Therapy;Passive range of motion   Plan assess if pain better - to intiated strenghening for grip    OT Home Exercise Plan See pt instruction    Consulted and Agree with Plan of Care Patient      Patient will benefit from skilled therapeutic intervention in order to improve the following deficits and impairments:  Impaired flexibility, Decreased range of motion, Decreased coordination, Decreased skin integrity, Impaired UE functional use, Pain, Decreased strength  Visit Diagnosis: Localized edema  Pain in right finger(s)  Muscle weakness (generalized)  Stiffness of right hand, not elsewhere classified    Problem List Patient Active Problem List   Diagnosis Date Noted  . Family history of early CAD 03/15/2015  . COLONIC POLYPS 06/30/2010  . ANEMIA 06/30/2010  .  Depression, major, in remission (HCC) 06/30/2010  . Hx of MENINGITIS 06/30/2010  .  ENDOMETRIOSIS 06/30/2010  . CARDIAC MURMUR 06/30/2010  . PAP SMEAR, ABNORMAL 06/30/2010  . CHICKENPOX, HX OF 06/30/2010    Oletta Cohn OTR/L,CLT  01/02/2016, 7:17 PM   [facility] PHYSICAL AND SPORTS MEDICINE [address] [place], [address] Phone: [phone]   Fax:  [phone]  Name: REE ALCALDE MRN: [id number] Date of Birth: 17-Sep-1980

## 2016-01-02 NOTE — Patient Instructions (Signed)
Same HEP - did not do strengthening because of pain at rest still up

## 2016-01-08 ENCOUNTER — Ambulatory Visit: Payer: BLUE CROSS/BLUE SHIELD | Admitting: Occupational Therapy

## 2016-01-08 DIAGNOSIS — M25641 Stiffness of right hand, not elsewhere classified: Secondary | ICD-10-CM | POA: Diagnosis not present

## 2016-01-08 DIAGNOSIS — M6281 Muscle weakness (generalized): Secondary | ICD-10-CM

## 2016-01-08 DIAGNOSIS — M79644 Pain in right finger(s): Secondary | ICD-10-CM | POA: Diagnosis not present

## 2016-01-08 DIAGNOSIS — R600 Localized edema: Secondary | ICD-10-CM

## 2016-01-08 NOTE — Therapy (Signed)
[facility] [address], [place], [address] Phone: [phone]   Fax:  [phone]  Occupational Therapy Treatment  Patient Details  Name: Deborah Navarro MRN: [id number] Date of Birth: February 10, 1981 Referring Provider: Marney Doctor  Encounter Date: 01/08/2016      OT End of Session - 01/08/16 1623    Visit Number 7   Number of Visits 12   Date for OT Re-Evaluation 01/23/16   OT Start Time 1448   OT Stop Time 1531   OT Time Calculation (min) 43 min   Activity Tolerance Patient tolerated treatment well   Behavior During Therapy [facility] for tasks assessed/performed      Past Medical History:  Diagnosis Date  . Anemia, unspecified   . Blood transfusion, without reported diagnosis   . Depressive disorder, not elsewhere classified   . Elevated blood pressure reading without diagnosis of hypertension   . Endometriosis of fallopian tube   . Irritable bowel syndrome   . Streptococcal meningitis   . Undiagnosed cardiac murmurs   . Unspecified personal history presenting hazards to health     Past Surgical History:  Procedure Laterality Date  . DILATION AND CURETTAGE OF UTERUS    . LAPAROSCOPIC ENDOMETRIOSIS FULGURATION  10-1999   endometriosis  . NECK SURGERY N/A 06/14/15   due to C5/6 C6-7 disc herniation   . open heart surgery  12-1994   asd repair    There were no vitals filed for this visit.      Subjective Assessment - 01/08/16 1509    Subjective  I don't know if I do to much - and working my fingers to much - between that and doing my job - they stay sore - but today okay - everything better except the bending the tip of my ring finger   Patient Stated Goals Want to get the use of my hand back - bend my finges , get swelling and pain better    Currently in Pain? No/denies            [facility] OT Assessment - 01/08/16 0001      Strength   Right Hand Grip (lbs) 45   Right Hand Lateral Pinch 20  lbs   Right Hand 3 Point Pinch 14 lbs   Left Hand Grip (lbs) 55   Left Hand Lateral Pinch 18 lbs   Left Hand 3 Point Pinch 13 lbs                  OT Treatments/Exercises (OP) - 01/08/16 0001      RUE Fluidotherapy   Number Minutes Fluidotherapy 10 Minutes   RUE Fluidotherapy Location Hand   Comments after measurements AROM and to increase ROM and decrease pain       measurements taken for grip and prehension strength - great progress    then fluido   Assess skin on lateral side of digits where pt had some scabs from blisters in beginning  Soft tissue mobs done to lateral sides of 4th DIP prior to ROM  Gentle traction done to 4th DIP  Prior to flexion  Pt to desentitization - report sensation is getting better  CONT as tolerate with silicon digisleeve for 4th and 3rd   PROM of DIP 4thto 50 this date Hold in DIP /PIP flexion stretch   AROM blocked intrinsic fist  Place and hold DIP flexion this date at 40 -45 degrees AROM 40 degrees  At end  Full fist to palm - work on composite             OT Education - 01/08/16 1622    Education provided Yes   Education Details HEP update   Person(s) Educated Patient   Methods Explanation;Demonstration;Tactile cues;Verbal cues   Comprehension Returned demonstration;Verbalized understanding;Verbal cues required          OT Short Term Goals - 01/02/16 1915      OT SHORT TERM GOAL #1   Title AROM in 3rd and 4th digits improve for pt to touch palm to turn doorknob   Status Achieved     OT SHORT TERM GOAL #2   Title Edema decrease to less than 0.4 to increase AROM to make full fist to hold knife and toothbrush with all digits    Status Achieved     OT SHORT TERM GOAL #3   Title Pain on PRWHE improve by at least 5 points    Baseline 12/50  on PRWHE and pain at the most 4/10 with attempt to make fist  - to be assess   Time 1   Period Weeks   Status On-going           OT Long Term Goals -  01/02/16 1916      OT LONG TERM GOAL #1   Title Grip strength increase in R hand to 50% compare to L to use all digits in  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, environmental job    Baseline Grip 26 lbs R , L 55lbs    Time 3   Period Weeks   Status On-going     OT LONG TERM GOAL #2   Title Function on PRWHE improve by 10 lbs to carry 10 lbs, turn doorknob, cut with knife    Baseline PRWHE with function at eval 12/50  - assess next session   Time 2   Period Weeks   Status On-going               Plan - 01/08/16 1624    Clinical Impression Statement  Pt made great progress this past few days in grip and prehension - PIP and MC flexion WNL - DIP of 4th improve to 40 - still tight and painfull - and not able to wear compression - pt to  do soft tissue mobs to lateral sides of 4th DIP and do gentle traction with flexion - will see in week    Rehab Potential Good   OT Frequency 1x / week   OT Duration 2 weeks   OT Treatment/Interventions Self-care/ADL training;Contrast Bath;Fluidtherapy;Ultrasound;Therapeutic exercise;Patient/family education;Splinting;Manual Therapy;Passive range of motion   Plan assess ROM at DIP 4th - if can be discharge with HEP   OT Home Exercise Plan See pt instruction    Consulted and Agree with Plan of Care Patient      Patient will benefit from skilled therapeutic intervention in order to improve the following deficits and impairments:  Impaired flexibility, Decreased range of motion, Decreased coordination, Decreased skin integrity, Impaired UE functional use, Pain, Decreased strength  Visit Diagnosis: Localized edema  Pain in right finger(s)  Muscle weakness (generalized)  Stiffness of right hand, not elsewhere classified    Problem List Patient Active Problem List   Diagnosis Date Noted  . Family history of early CAD 03/15/2015  . COLONIC POLYPS 06/30/2010  . ANEMIA 06/30/2010  . Depression, major, in remission (HCC) 06/30/2010  . Hx  of MENINGITIS 06/30/2010  .  ENDOMETRIOSIS 06/30/2010  . CARDIAC MURMUR 06/30/2010  . PAP SMEAR, ABNORMAL 06/30/2010  . CHICKENPOX, HX OF 06/30/2010    Oletta Cohn OTR/L,CLT  01/08/2016, 4:29 PM  [facility] [address] [place], [address] Phone: [phone]   Fax:  [phone]  Name: Deborah Navarro MRN: [id number] Date of Birth: 17-Nov-1980

## 2016-01-08 NOTE — Patient Instructions (Signed)
Only do HEP 2-3 x day - otherwise just use them  Gentle traction to 4th DIP with flexion  And soft tissue mobs to lateral side of DIP prior to ROM

## 2016-01-16 ENCOUNTER — Ambulatory Visit: Payer: BLUE CROSS/BLUE SHIELD | Attending: Student | Admitting: Occupational Therapy

## 2016-01-16 DIAGNOSIS — M6281 Muscle weakness (generalized): Secondary | ICD-10-CM | POA: Diagnosis not present

## 2016-01-16 DIAGNOSIS — R600 Localized edema: Secondary | ICD-10-CM | POA: Diagnosis not present

## 2016-01-16 DIAGNOSIS — M25641 Stiffness of right hand, not elsewhere classified: Secondary | ICD-10-CM | POA: Diagnosis not present

## 2016-01-16 DIAGNOSIS — M79644 Pain in right finger(s): Secondary | ICD-10-CM | POA: Diagnosis not present

## 2016-01-16 NOTE — Patient Instructions (Signed)
Cont with PROM to 4th DIP , AROM blocked   AROM intrinsic fist blocked  Composite fist  Putty teal for grip - but to do on days if not working a lot and pain good

## 2016-01-16 NOTE — Therapy (Signed)
[place] [facility] [address], [place], [address] Phone: [phone]   Fax:  [phone]  Occupational Therapy Treatment  Patient Details  Name: Deborah Navarro MRN: [id number] Date of Birth: Sep 24, 1980 Referring Provider: Mikle Bosworth  Encounter Date: 01/16/2016      OT End of Session - 01/16/16 1721    Visit Number 8   Number of Visits 8   Date for OT Re-Evaluation 01/16/16   OT Start Time 1640   OT Stop Time 1718   OT Time Calculation (min) 38 min   Activity Tolerance Patient tolerated treatment well   Behavior During Therapy [facility] for tasks assessed/performed      Past Medical History:  Diagnosis Date  . Anemia, unspecified   . Blood transfusion, without reported diagnosis   . Depressive disorder, not elsewhere classified   . Elevated blood pressure reading without diagnosis of hypertension   . Endometriosis of fallopian tube   . Irritable bowel syndrome   . Streptococcal meningitis   . Undiagnosed cardiac murmurs   . Unspecified personal history presenting hazards to health     Past Surgical History:  Procedure Laterality Date  . DILATION AND CURETTAGE OF UTERUS    . LAPAROSCOPIC ENDOMETRIOSIS FULGURATION  10-1999   endometriosis  . NECK SURGERY N/A 06/14/15   due to C5/6 C6-7 disc herniation   . open heart surgery  12-1994   asd repair    There were no vitals filed for this visit.      Subjective Assessment - 01/16/16 1720    Subjective  Doing good , no really pain - but I did fell over a pallet at work and hurt my wrist and leg - but did my exercises and do not over work my finger anymore   Patient Stated Goals Want to get the use of my hand back - bend my finges , get swelling and pain better    Currently in Pain? No/denies            [facility] OT Assessment - 01/16/16 0001      Strength   Right Hand Grip (lbs) 47   Right Hand Lateral Pinch 20 lbs   Right Hand 3 Point Pinch 19 lbs    Left Hand Grip (lbs) 55   Left Hand Lateral Pinch 18 lbs   Left Hand 3 Point Pinch 13 lbs     Right Hand AROM   R Long  MCP 0-90 90 Degrees   R Long PIP 0-100 100 Degrees   R Long DIP 0-70 65 Degrees   R Ring  MCP 0-90 90 Degrees   R Ring PIP 0-100 100 Degrees   R Ring DIP 0-70 40 Degrees     ROM measured and grip /prehension strength  PRWHE done - simulated - see goals for score  PROM to 4th DIP done - and blocked AROM  Then reviewed teal putty for grip - for HEP  Pain was 3/10 - 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   HEP  Cont with PROM to 4th DIP , AROM blocked   AROM intrinsic fist blocked  Composite fist  Putty teal for grip - but to do on days if not working a lot and pain goo                    OT Education - 01/16/16 1720    Education provided Yes   Education Details HEP  Person(s) Educated Patient   Methods Explanation;Demonstration;Tactile cues;Verbal cues   Comprehension Verbal cues required;Returned demonstration;Verbalized understanding          OT Short Term Goals - 01/16/16 1656      OT SHORT TERM GOAL #1   Title AROM in 3rd and 4th digits improve for pt to touch palm to turn doorknob   Status Achieved     OT SHORT TERM GOAL #2   Title Edema decrease to less than 0.4 to increase AROM to make full fist to hold knife and toothbrush with all digits    Status Achieved     OT SHORT TERM GOAL #3   Title Pain on PRWHE improve by at least 5 points    Baseline PRWHE for pain 7/50   Status Achieved           OT Long Term Goals - 01/16/16 1657      OT LONG TERM GOAL #1   Title Grip strength increase in R hand to 50% compare to L to use all digits in  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    Baseline Grip 47, -R 55lbs    Status Achieved     OT LONG TERM GOAL #2   Title Function on PRWHE improve by 10 lbs to carry 10 lbs, turn doorknob, cut with knife    Baseline improve to 1/2 /50    Status Achieved                Plan - 01/16/16 1722    Clinical Impression Statement Pt made great progress from [facility] in pain , ROM , grip and prehension strength - pt still showing progress in DIP flexion at 4th - and report using hand in all ADL's and IADL's - pt to cont with HEP  at home - met all goals    Rehab Potential Good   OT Frequency 1x / week   OT Duration 2 weeks   OT Treatment/Interventions Self-care/ADL training;Contrast Bath;Fluidtherapy;Ultrasound;Therapeutic exercise;Patient/family education;Splinting;Manual Therapy;Passive range of motion   Plan discharge with HEP    OT Home Exercise Plan See pt instruction    Consulted and Agree with Plan of Care Patient      Patient will benefit from skilled therapeutic intervention in order to improve the following deficits and impairments:  Impaired flexibility, Decreased range of motion, Decreased coordination, Decreased skin integrity, Impaired UE functional use, Pain, Decreased strength  Visit Diagnosis: Localized edema  Pain in right finger(s)  Muscle weakness (generalized)  Stiffness of right hand, not elsewhere classified    Problem List Patient Active Problem List   Diagnosis Date Noted  . Family history of early CAD 03/15/2015  . COLONIC POLYPS 06/30/2010  . ANEMIA 06/30/2010  . Depression, major, in remission ([place]) 06/30/2010  . Hx of MENINGITIS 06/30/2010  . ENDOMETRIOSIS 06/30/2010  . CARDIAC MURMUR 06/30/2010  . PAP SMEAR, ABNORMAL 06/30/2010  . CHICKENPOX, HX OF 06/30/2010    Rosalyn Gess OTR/L; CLT  01/16/2016, 7:21 PM  [place] [facility] [address] [place], [address] Phone: [phone]   Fax:  [phone]  Name: Deborah Navarro MRN: [id number] Date of Birth: 03/25/1981

## 2016-01-20 DIAGNOSIS — S6991XD Unspecified injury of right wrist, hand and finger(s), subsequent encounter: Secondary | ICD-10-CM | POA: Diagnosis not present

## 2016-01-20 DIAGNOSIS — S63259D Unspecified dislocation of unspecified finger, subsequent encounter: Secondary | ICD-10-CM | POA: Diagnosis not present

## 2016-01-20 DIAGNOSIS — H5213 Myopia, bilateral: Secondary | ICD-10-CM | POA: Diagnosis not present

## 2016-03-02 DIAGNOSIS — S63259D Unspecified dislocation of unspecified finger, subsequent encounter: Secondary | ICD-10-CM | POA: Diagnosis not present

## 2016-03-02 DIAGNOSIS — S6991XD Unspecified injury of right wrist, hand and finger(s), subsequent encounter: Secondary | ICD-10-CM | POA: Diagnosis not present

## 2016-03-10 ENCOUNTER — Emergency Department (HOSPITAL_COMMUNITY): Payer: BLUE CROSS/BLUE SHIELD

## 2016-03-10 ENCOUNTER — Encounter (HOSPITAL_COMMUNITY): Payer: Self-pay | Admitting: Neurology

## 2016-03-10 ENCOUNTER — Emergency Department (HOSPITAL_COMMUNITY)
Admission: EM | Admit: 2016-03-10 | Discharge: 2016-03-10 | Disposition: A | Discharge status: Home / Self Care | Payer: BLUE CROSS/BLUE SHIELD | Attending: Emergency Medicine | Admitting: Emergency Medicine

## 2016-03-10 DIAGNOSIS — R0981 Nasal congestion: Secondary | ICD-10-CM | POA: Diagnosis not present

## 2016-03-10 DIAGNOSIS — R51 Headache: Secondary | ICD-10-CM | POA: Diagnosis not present

## 2016-03-10 DIAGNOSIS — R519 Headache, unspecified: Secondary | ICD-10-CM

## 2016-03-10 LAB — I-STAT BETA HCG BLOOD, ED (MC, WL, AP ONLY)

## 2016-03-10 MED ORDER — MOMETASONE FUROATE 50 MCG/ACT NA SUSP
2.0000 | Freq: Every day | NASAL | 12 refills | Status: DC
Start: 2016-03-10 — End: 2017-10-12

## 2016-03-10 MED ORDER — LORATADINE 10 MG PO TABS: 10.0000 mg | ORAL_TABLET | Freq: Every day | ORAL | 0 refills | Status: DC

## 2016-03-10 MED ORDER — METOCLOPRAMIDE HCL 10 MG PO TABS: 10.0000 mg | ORAL_TABLET | Freq: Four times a day (QID) | ORAL | 0 refills | Status: DC | PRN

## 2016-03-10 MED ORDER — METHYLPREDNISOLONE SODIUM SUCC 125 MG IJ SOLR
125.0000 mg | Freq: Once | INTRAMUSCULAR | Status: AC
Start: 2016-03-10 — End: 2016-03-10
  Administered 2016-03-10: 125 mg via INTRAVENOUS
  Filled 2016-03-10: qty 2

## 2016-03-10 MED ORDER — KETOROLAC TROMETHAMINE 30 MG/ML IJ SOLN
30.0000 mg | Freq: Once | INTRAMUSCULAR | Status: AC
  Administered 2016-03-10: 30 mg via INTRAVENOUS
  Filled 2016-03-10: qty 1

## 2016-03-10 MED ORDER — METHOCARBAMOL 500 MG PO TABS: 1000.0000 mg | ORAL_TABLET | Freq: Three times a day (TID) | ORAL | 0 refills | Status: DC | PRN

## 2016-03-10 MED ORDER — DIPHENHYDRAMINE HCL 50 MG/ML IJ SOLN
25.0000 mg | Freq: Once | INTRAMUSCULAR | Status: AC
  Administered 2016-03-10: 25 mg via INTRAVENOUS
  Filled 2016-03-10: qty 1

## 2016-03-10 MED ORDER — IBUPROFEN 600 MG PO TABS: 600.0000 mg | ORAL_TABLET | Freq: Four times a day (QID) | ORAL | 0 refills | Status: DC | PRN

## 2016-03-10 MED ORDER — OXYCODONE-ACETAMINOPHEN 5-325 MG PO TABS
1.0000 | ORAL_TABLET | ORAL | Status: DC | PRN
  Administered 2016-03-10: 1 via ORAL

## 2016-03-10 MED ORDER — SODIUM CHLORIDE 0.9 % IV BOLUS (SEPSIS)
2000.0000 mL | Freq: Once | INTRAVENOUS | Status: AC
  Administered 2016-03-10: 2000 mL via INTRAVENOUS

## 2016-03-10 MED ORDER — METOCLOPRAMIDE HCL 5 MG/ML IJ SOLN
10.0000 mg | Freq: Once | INTRAMUSCULAR | Status: AC
  Administered 2016-03-10: 10 mg via INTRAVENOUS
  Filled 2016-03-10: qty 2

## 2016-03-10 MED ORDER — OXYMETAZOLINE HCL 0.05 % NA SOLN: 1.0000 | Freq: Two times a day (BID) | NASAL | 0 refills | Status: DC

## 2016-03-10 MED ORDER — OXYCODONE-ACETAMINOPHEN 5-325 MG PO TABS
ORAL_TABLET | ORAL | Status: AC
  Filled 2016-03-10: qty 1

## 2016-03-10 NOTE — ED Notes (Signed)
Pt stable, ambulatory, states understanding of discharge instructions 

## 2016-03-10 NOTE — ED Triage Notes (Signed)
Pt reports h/a, neck pain x 1 month, intermittently. Has hx of migranes with similar sx. Had previous herniated discs, with similar neck pain. Pt is a x 4. Reports nausea, denies vomiting.

## 2016-03-10 NOTE — ED Notes (Signed)
CT notified hCG is negative

## 2016-03-10 NOTE — ED Provider Notes (Signed)
[facility]-EMERGENCY DEPT Provider Note   CSN: [id number] Arrival date & time: 03/10/16  1039     History   Chief Complaint Chief Complaint  Patient presents with  . Headache  . Neck Pain    HPI Deborah Navarro is a 35 y.o. female.  HPI Patient states she's had a chronic headache for one month. States the headache is mostly behind her eyes and in the back of her head. She does have some nasal congestion but denies any fever or chills. States she's been taking ibuprofen at home for the pain. Had similar headache last year was diagnosed with cervical radiculopathy. She had surgery performed by Dr. Danielle Dess with improvement of her pain. She now describes throbbing by temporal headache. Denies photophobia but has had some nausea and vomiting. No focal weakness or numbness. Past Medical History:  Diagnosis Date  . Anemia, unspecified   . Blood transfusion, without reported diagnosis   . Depressive disorder, not elsewhere classified   . Elevated blood pressure reading without diagnosis of hypertension   . Endometriosis of fallopian tube   . Irritable bowel syndrome   . Streptococcal meningitis   . Undiagnosed cardiac murmurs   . Unspecified personal history presenting hazards to health     Patient Active Problem List   Diagnosis Date Noted  . Family history of early CAD 03/15/2015  . COLONIC POLYPS 06/30/2010  . ANEMIA 06/30/2010  . Depression, major, in remission (HCC) 06/30/2010  . Hx of MENINGITIS 06/30/2010  . ENDOMETRIOSIS 06/30/2010  . CARDIAC MURMUR 06/30/2010  . PAP SMEAR, ABNORMAL 06/30/2010  . CHICKENPOX, HX OF 06/30/2010    Past Surgical History:  Procedure Laterality Date  . DILATION AND CURETTAGE OF UTERUS    . LAPAROSCOPIC ENDOMETRIOSIS FULGURATION  10-1999   endometriosis  . NECK SURGERY N/A 06/14/15   due to C5/6 C6-7 disc herniation   . open heart surgery  12-1994   asd repair    OB History    No data available       Home Medications    Prior to  Admission medications   Medication Sig Start Date End Date Taking? Authorizing Provider  acetaminophen-codeine (TYLENOL #3) 300-30 MG tablet Take one tablet by mouth every 4 to 6 hours as needed for pain. Patient not taking: Reported on 03/10/2016 04/24/15   Hannah Beat, MD  amitriptyline (ELAVIL) 50 MG tablet Take 1 tablet (50 mg total) by mouth at bedtime. Patient not taking: Reported on 03/10/2016 05/16/15   Excell Seltzer, MD  HYDROcodone-acetaminophen (NORCO/VICODIN) 5-325 MG tablet Take 1 tablet by mouth every 8 (eight) hours as needed for moderate pain or severe pain. Patient not taking: Reported on 03/10/2016 11/22/15   Excell Seltzer, MD  ibuprofen (ADVIL,MOTRIN) 600 MG tablet Take 1 tablet (600 mg total) by mouth every 6 (six) hours as needed. 03/10/16   Loren Racer, MD  loratadine (CLARITIN) 10 MG tablet Take 1 tablet (10 mg total) by mouth daily. 03/10/16   Loren Racer, MD  methocarbamol (ROBAXIN) 500 MG tablet Take 2 tablets (1,000 mg total) by mouth every 8 (eight) hours as needed for muscle spasms. 03/10/16   Loren Racer, MD  metoCLOPramide (REGLAN) 10 MG tablet Take 1 tablet (10 mg total) by mouth every 6 (six) hours as needed for nausea (headache). 03/10/16   Loren Racer, MD  mometasone (NASONEX) 50 MCG/ACT nasal spray Place 2 sprays into the nose daily. 03/10/16   Loren Racer, MD  oxymetazoline [facility] NASAL SPRAY) 0.05 %  nasal spray Place 1 spray into both nostrils 2 (two) times daily. 03/10/16   Loren Raceravid Livia Tarr, MD  pregabalin (LYRICA) 75 MG capsule Take 1 capsule (75 mg total) by mouth 2 (two) times daily. Patient not taking: Reported on 03/10/2016 02/20/15   Hannah BeatSpencer Copland, MD  tiZANidine (ZANAFLEX) 4 MG tablet Take 1 tablet (4 mg total) by mouth Nightly. Patient not taking: Reported on 03/10/2016 04/03/15   Hannah BeatSpencer Copland, MD    Family History Family History  Problem Relation Age of Onset  . Cancer Mother 3251    non hodgkins lymphoma(mets to brain)  . Coronary  artery disease Father   . Hypertension Father   . Hyperlipidemia Father   . Diabetes Father     Social History Social History  Substance Use Topics  . Smoking status: Never Smoker  . Smokeless tobacco: Never Used  . Alcohol use 0.6 oz/week    1 Shots of liquor per week     Allergies   Review of patient's allergies indicates no known allergies.   Review of Systems Review of Systems  Constitutional: Negative for chills, fatigue and fever.  HENT: Positive for congestion and sinus pressure. Negative for facial swelling and sore throat.   Eyes: Negative for photophobia and visual disturbance.  Gastrointestinal: Positive for nausea and vomiting. Negative for abdominal pain.  Musculoskeletal: Positive for myalgias and neck pain. Negative for back pain and neck stiffness.  Skin: Negative for rash and wound.  Neurological: Positive for headaches. Negative for weakness and numbness.  All other systems reviewed and are negative.    Physical Exam Updated Vital Signs BP 123/64 (BP Location: Left Arm)   Pulse 65   Temp 98 F (36.7 C) (Oral)   Resp 14   Ht 5\' 4"  (1.626 m)   Wt 150 lb (68 kg)   LMP 03/10/2016   SpO2 100%   BMI 25.75 kg/m   Physical Exam  Constitutional: She is oriented to person, place, and time. She appears well-developed and well-nourished. No distress.  HENT:  Head: Normocephalic and atraumatic.  Mouth/Throat: Oropharynx is clear and moist.  Nasal Mucosal edema. No definite time sinus tenderness with percussion.  Eyes: EOM are normal. Pupils are equal, round, and reactive to light.  Neck: Normal range of motion. Neck supple.  No meningismus. Patient does have some left greater than right paracervical tenderness to palpation. No midline tenderness.  Cardiovascular: Normal rate and regular rhythm.   Pulmonary/Chest: Effort normal and breath sounds normal.  Abdominal: Soft. Bowel sounds are normal. There is no tenderness. There is no rebound and no guarding.   Musculoskeletal: Normal range of motion. She exhibits no edema or tenderness.  Neurological: She is alert and oriented to person, place, and time.  Patient is alert and oriented x3 with clear, goal oriented speech. Patient has 5/5 motor in all extremities. Sensation is intact to light touch. Bilateral finger-to-nose is normal with no signs of dysmetria. Patient has a normal gait and walks without assistance.  Skin: Skin is warm and dry. Capillary refill takes less than 2 seconds. No rash noted. No erythema.  Psychiatric: She has a normal mood and affect. Her behavior is normal.  Nursing note and vitals reviewed.    ED Treatments / Results  Labs (all labs ordered are listed, but only abnormal results are displayed) Labs Reviewed  I-STAT BETA HCG BLOOD, ED ([facility], [facility], [facility] ONLY)    EKG  EKG Interpretation None       Radiology Ct Head Wo  Contrast  Result Date: 03/10/2016 CLINICAL DATA:  Headaches EXAM: CT HEAD WITHOUT CONTRAST TECHNIQUE: Contiguous axial images were obtained from the base of the skull through the vertex without intravenous contrast. COMPARISON:  None. FINDINGS: Brain: No evidence of acute infarction, hemorrhage, hydrocephalus, extra-axial collection or mass lesion/mass effect. Vascular: No hyperdense vessel or unexpected calcification. Skull: Normal. Negative for fracture or focal lesion. Sinuses/Orbits: No acute finding. Other: None. IMPRESSION: 1. Normal brain. Electronically Signed   By: Signa Kell M.D.   On: 03/10/2016 20:51    Procedures Procedures (including critical care time)  Medications Ordered in ED Medications  oxyCODONE-acetaminophen (PERCOCET/ROXICET) 5-325 MG per tablet 1 tablet (1 tablet Oral Given 03/10/16 1103)  diphenhydrAMINE (BENADRYL) injection 25 mg (25 mg Intravenous Given 03/10/16 1557)  methylPREDNISolone sodium succinate (SOLU-MEDROL) 125 mg/2 mL injection 125 mg (125 mg Intravenous Given 03/10/16 1558)  metoCLOPramide (REGLAN) injection 10  mg (10 mg Intravenous Given 03/10/16 1558)  sodium chloride 0.9 % bolus 2,000 mL (0 mLs Intravenous Stopped 03/10/16 1805)  ketorolac (TORADOL) 30 MG/ML injection 30 mg (30 mg Intravenous Given 03/10/16 1803)     Initial Impression / Assessment and Plan / ED Course  I have reviewed the triage vital signs and the nursing notes.  Pertinent labs & imaging results that were available during my care of the patient were reviewed by me and considered in my medical decision making (see chart for details).  Clinical Course   Patient is very well-appearing. She has a normal neurologic exam. Believe her headache is likely multifactorial. We'll treat and reassess. Headache is improved after meds. CT without any acute brain abnormalities. Patient does have nasal mucosal edema and some paranasal sinus fluid collections. We'll treat for sinusitis. Given follow-up with the headache clinic. Return precautions given. Final Clinical Impressions(s) / ED Diagnoses   Final diagnoses:  Nonintractable headache, unspecified chronicity pattern, unspecified headache type  Sinus congestion    New Prescriptions Discharge Medication List as of 03/10/2016  9:07 PM    START taking these medications   Details  loratadine (CLARITIN) 10 MG tablet Take 1 tablet (10 mg total) by mouth daily., Starting Tue 03/10/2016, Print    methocarbamol (ROBAXIN) 500 MG tablet Take 2 tablets (1,000 mg total) by mouth every 8 (eight) hours as needed for muscle spasms., Starting Tue 03/10/2016, Print    metoCLOPramide (REGLAN) 10 MG tablet Take 1 tablet (10 mg total) by mouth every 6 (six) hours as needed for nausea (headache)., Starting Tue 03/10/2016, Print    mometasone (NASONEX) 50 MCG/ACT nasal spray Place 2 sprays into the nose daily., Starting Tue 03/10/2016, Print    oxymetazoline (AFRIN NASAL SPRAY) 0.05 % nasal spray Place 1 spray into both nostrils 2 (two) times daily., Starting Tue 03/10/2016, Print         Loren Racer,  MD 03/10/16 [phone]

## 2016-03-18 ENCOUNTER — Encounter: Payer: Self-pay | Admitting: Emergency Medicine

## 2016-03-18 ENCOUNTER — Emergency Department: Payer: BLUE CROSS/BLUE SHIELD

## 2016-03-18 ENCOUNTER — Emergency Department
Admission: EM | Admit: 2016-03-18 | Discharge: 2016-03-19 | Disposition: A | Discharge status: Home / Self Care | Payer: BLUE CROSS/BLUE SHIELD | Attending: Emergency Medicine | Admitting: Emergency Medicine

## 2016-03-18 DIAGNOSIS — M5412 Radiculopathy, cervical region: Secondary | ICD-10-CM | POA: Insufficient documentation

## 2016-03-18 DIAGNOSIS — M542 Cervicalgia: Secondary | ICD-10-CM | POA: Diagnosis not present

## 2016-03-18 DIAGNOSIS — Z79899 Other long term (current) drug therapy: Secondary | ICD-10-CM | POA: Diagnosis not present

## 2016-03-18 DIAGNOSIS — R51 Headache: Secondary | ICD-10-CM | POA: Insufficient documentation

## 2016-03-18 DIAGNOSIS — Z791 Long term (current) use of non-steroidal anti-inflammatories (NSAID): Secondary | ICD-10-CM | POA: Insufficient documentation

## 2016-03-18 DIAGNOSIS — R079 Chest pain, unspecified: Secondary | ICD-10-CM | POA: Diagnosis not present

## 2016-03-18 DIAGNOSIS — R200 Anesthesia of skin: Secondary | ICD-10-CM | POA: Diagnosis not present

## 2016-03-18 DIAGNOSIS — G8929 Other chronic pain: Secondary | ICD-10-CM | POA: Insufficient documentation

## 2016-03-18 DIAGNOSIS — R531 Weakness: Secondary | ICD-10-CM | POA: Diagnosis not present

## 2016-03-18 LAB — CBC
HCT: 39.1 % (ref 35.0–47.0)
Hemoglobin: 13.6 g/dL (ref 12.0–16.0)
MCH: 30.9 pg (ref 26.0–34.0)
MCHC: 34.8 g/dL (ref 32.0–36.0)
MCV: 88.7 fL (ref 80.0–100.0)
PLATELETS: 238 10*3/uL (ref 150–440)
RBC: 4.41 MIL/uL (ref 3.80–5.20)
RDW: 13.2 % (ref 11.5–14.5)
WBC: 5.7 10*3/uL (ref 3.6–11.0)

## 2016-03-18 LAB — BASIC METABOLIC PANEL
Anion gap: 7 (ref 5–15)
BUN: 14 mg/dL (ref 6–20)
CALCIUM: 9.4 mg/dL (ref 8.9–10.3)
CHLORIDE: 104 mmol/L (ref 101–111)
CO2: 27 mmol/L (ref 22–32)
CREATININE: 1.02 mg/dL — AB (ref 0.44–1.00)
GFR calc Af Amer: >60 mL/min (ref >60)
GFR calc non Af Amer: >60 mL/min (ref >60)
Glucose, Bld: 105 mg/dL — ABNORMAL HIGH (ref 65–99)
Potassium: 4.2 mmol/L (ref 3.5–5.1)
Sodium: 138 mmol/L (ref 135–145)

## 2016-03-18 LAB — TROPONIN I

## 2016-03-18 MED ORDER — DEXAMETHASONE SODIUM PHOSPHATE 10 MG/ML IJ SOLN
10.0000 mg | Freq: Once | INTRAMUSCULAR | Status: AC
  Administered 2016-03-19: 10 mg via INTRAVENOUS
  Filled 2016-03-18: qty 1

## 2016-03-18 MED ORDER — METOCLOPRAMIDE HCL 5 MG/ML IJ SOLN
10.0000 mg | INTRAMUSCULAR | Status: AC
  Administered 2016-03-19: 10 mg via INTRAVENOUS
  Filled 2016-03-18: qty 2

## 2016-03-18 MED ORDER — KETOROLAC TROMETHAMINE 30 MG/ML IJ SOLN
15.0000 mg | Freq: Once | INTRAMUSCULAR | Status: AC
  Administered 2016-03-19: 15 mg via INTRAVENOUS
  Filled 2016-03-18: qty 1

## 2016-03-18 MED ORDER — SODIUM CHLORIDE 0.9 % IV BOLUS (SEPSIS)
500.0000 mL | INTRAVENOUS | Status: AC
  Administered 2016-03-19: 500 mL via INTRAVENOUS

## 2016-03-18 MED ORDER — DIPHENHYDRAMINE HCL 50 MG/ML IJ SOLN
12.5000 mg | Freq: Once | INTRAMUSCULAR | Status: AC
  Administered 2016-03-19: 12.5 mg via INTRAVENOUS
  Filled 2016-03-18: qty 1

## 2016-03-18 NOTE — ED Provider Notes (Signed)
[facility] Emergency Department Provider Note  ____________________________________________   First MD Initiated Contact with Patient 03/18/16 2329     (approximate)  I have reviewed the triage vital signs and the nursing notes.   HISTORY  Chief Complaint Chest Pain; Headache; and Weakness    HPI Deborah Navarro is a 35 y.o. female who presents for evaluation of left-sided headache that radiates down her left side of neck with some numbness and possibly weakness in her left arm.  However she also told triage (although she did not tell me) that she has also had some weakness in her right arm.  She has had this headache for a month but it seems to be getting worse and radiating down her body.  She has had prior cervical spine surgery (disc replacement) at the C4-C5 or C5-C6 levels.  She does state that when she was having problems with her disks previously she also had headaches.  She was seen about 5 days ago at [facility] and had a normal noncontrasted head CT scan and an essentially otherwise unremarkable workup.  She had some improvement when treated for migraine and also received a prescription for Norco.  She was referred to a headache clinic but states that she has not had the opportunity to call them yet due to her work schedule.  She states that the symptoms but gradual in onset but are gradually getting worse.  They are much better when she lies down and worse when she is up and moving around.  The pain medication makes her better for a little bit but then he gets worse again.  She denies nausea, vomiting, fever/chills, shortness of breath.  She has had some pain in the left side of her chest but she reports that she does have chest pain sometimes "due to my open heart surgery" (she reportedly had an ASD repair about 21 years ago).    She describes the headache as throbbing involving the left side of her head and left back of the head and down the left  side of her neck.    Past Medical History:  Diagnosis Date  . Anemia, unspecified   . Blood transfusion, without reported diagnosis   . Depressive disorder, not elsewhere classified   . Elevated blood pressure reading without diagnosis of hypertension   . Endometriosis of fallopian tube   . Irritable bowel syndrome   . Streptococcal meningitis   . Undiagnosed cardiac murmurs   . Unspecified personal history presenting hazards to health     Patient Active Problem List   Diagnosis Date Noted  . Family history of early CAD 03/15/2015  . COLONIC POLYPS 06/30/2010  . ANEMIA 06/30/2010  . Depression, major, in remission (HCC) 06/30/2010  . Hx of MENINGITIS 06/30/2010  . ENDOMETRIOSIS 06/30/2010  . CARDIAC MURMUR 06/30/2010  . PAP SMEAR, ABNORMAL 06/30/2010  . CHICKENPOX, HX OF 06/30/2010    Past Surgical History:  Procedure Laterality Date  . DILATION AND CURETTAGE OF UTERUS    . LAPAROSCOPIC ENDOMETRIOSIS FULGURATION  10-1999   endometriosis  . NECK SURGERY N/A 06/14/15   due to C5/6 C6-7 disc herniation   . open heart surgery  12-1994   asd repair    Prior to Admission medications   Medication Sig Start Date End Date Taking? Authorizing Provider  acetaminophen-codeine (TYLENOL #3) 300-30 MG tablet Take one tablet by mouth every 4 to 6 hours as needed for pain. Patient not taking: Reported on 03/10/2016 04/24/15  Hannah Beat, MD  amitriptyline (ELAVIL) 50 MG tablet Take 1 tablet (50 mg total) by mouth at bedtime. Patient not taking: Reported on 03/10/2016 05/16/15   Excell Seltzer, MD  HYDROcodone-acetaminophen (NORCO/VICODIN) 5-325 MG tablet Take 1 tablet by mouth every 8 (eight) hours as needed for moderate pain or severe pain. Patient not taking: Reported on 03/10/2016 11/22/15   Excell Seltzer, MD  ibuprofen (ADVIL,MOTRIN) 600 MG tablet Take 1 tablet (600 mg total) by mouth every 6 (six) hours as needed. 03/10/16   Loren Racer, MD  loratadine (CLARITIN) 10 MG tablet Take  1 tablet (10 mg total) by mouth daily. 03/10/16   Loren Racer, MD  methocarbamol (ROBAXIN) 500 MG tablet Take 2 tablets (1,000 mg total) by mouth every 8 (eight) hours as needed for muscle spasms. 03/10/16   Loren Racer, MD  metoCLOPramide (REGLAN) 10 MG tablet Take 1 tablet (10 mg total) by mouth every 6 (six) hours as needed for nausea (headache). 03/10/16   Loren Racer, MD  mometasone (NASONEX) 50 MCG/ACT nasal spray Place 2 sprays into the nose daily. 03/10/16   Loren Racer, MD  oxymetazoline (AFRIN NASAL SPRAY) 0.05 % nasal spray Place 1 spray into both nostrils 2 (two) times daily. 03/10/16   Loren Racer, MD  pregabalin (LYRICA) 75 MG capsule Take 1 capsule (75 mg total) by mouth 2 (two) times daily. Patient not taking: Reported on 03/10/2016 02/20/15   Hannah Beat, MD  tiZANidine (ZANAFLEX) 4 MG tablet Take 1 tablet (4 mg total) by mouth Nightly. Patient not taking: Reported on 03/10/2016 04/03/15   Hannah Beat, MD    Allergies Review of patient's allergies indicates no known allergies.  Family History  Problem Relation Age of Onset  . Cancer Mother 12    non hodgkins lymphoma(mets to brain)  . Coronary artery disease Father   . Hypertension Father   . Hyperlipidemia Father   . Diabetes Father     Social History Social History  Substance Use Topics  . Smoking status: Never Smoker  . Smokeless tobacco: Never Used  . Alcohol use 0.6 oz/week    1 Shots of liquor per week    Review of Systems Constitutional: No fever/chills Eyes: No visual changes. ENT: No sore throat.  Pain in left side of neck, and radiating down from head.  No difficulty swallowing. Cardiovascular: +chest pain (chronic) Respiratory: Denies shortness of breath. Gastrointestinal: No abdominal pain.  No nausea, no vomiting.  No diarrhea.  No constipation. Genitourinary: Negative for dysuria. Musculoskeletal: Negative for back pain. Skin: Negative for rash. Neurological: Left sided  throbbing headache.  Intermittent numbness in left upper extremity.  10-point ROS otherwise negative.  ____________________________________________   PHYSICAL EXAM:  VITAL SIGNS: ED Triage Vitals  Enc Vitals Group     BP 03/18/16 2117 140/71     Pulse Rate 03/18/16 2117 85     Resp 03/18/16 2117 18     Temp 03/18/16 2117 98.8 F (37.1 C)     Temp Source 03/18/16 2117 Oral     SpO2 03/18/16 2117 99 %     Weight 03/18/16 2135 150 lb (68 kg)     Height 03/18/16 2135 5\' 4"  (1.626 m)     Head Circumference --      Peak Flow --      Pain Score 03/18/16 2136 7     Pain Loc --      Pain Edu? --      Excl. in GC? --  Constitutional: Alert and oriented. Well appearing and in no acute distress. Eyes: Conjunctivae are normal. PERRL. EOMI. Head: Atraumatic. Nose: No congestion/rhinnorhea. Mouth/Throat: Mucous membranes are moist.  Oropharynx non-erythematous. Neck: No stridor.  No meningeal signs.  No bruits. No cervical spine tenderness to palpation.  Some soft tissue tenderness in the left side of neck.   Cardiovascular: Normal rate, regular rhythm. Good peripheral circulation. Grossly normal heart sounds. Respiratory: Normal respiratory effort.  No retractions. Lungs CTAB. Gastrointestinal: Soft and nontender. No distention.  Musculoskeletal: No lower extremity tenderness nor edema. No gross deformities of extremities. Neurologic:  Normal speech and language.  No gross cranial nerve deficits appreciated.  No gross focal neurologic deficits are appreciated; She has normal muscle strength in bilateral upper extremities and lower extremities.  She has no dysmetria.  No gait instability.  She states very minimal numbness in the left upper extremity at this time and she has light touch sensation intact. Skin:  Skin is warm, dry and intact. No rash noted. Psychiatric: Mood and affect are normal. Speech and behavior are normal.  ____________________________________________   LABS (all  labs ordered are listed, but only abnormal results are displayed)  Labs Reviewed  BASIC METABOLIC PANEL - Abnormal; Notable for the following:       Result Value   Glucose, Bld 105 (*)    Creatinine, Ser 1.02 (*)    All other components within normal limits  CBC  TROPONIN I   ____________________________________________  EKG  ED ECG REPORT I, Sahvannah Rieser, the attending physician, personally viewed and interpreted this ECG.  Date: 03/17/2016 EKG Time: 21:33 Rate: 83 Rhythm: normal sinus rhythm QRS Axis: normal Intervals: normal ST/T Wave abnormalities: normal Conduction Disturbances: none Narrative Interpretation: unremarkable  ____________________________________________  RADIOLOGY   Dg Chest 2 View  Result Date: 03/19/2016 CLINICAL DATA:  Headache for 1 month. RIGHT-sided weakness for 2 days. EXAM: CHEST  2 VIEW COMPARISON:  Chest radiograph Oct 18, 2014 FINDINGS: Cardiomediastinal silhouette is normal. No pleural effusions or focal consolidations. Trachea projects midline and there is no pneumothorax. Soft tissue planes and included osseous structures are non-suspicious. Status post median sternotomy. C5-6 and C6-7 interbody disc prosthesis. IMPRESSION: No acute cardiopulmonary process. Electronically Signed   By: Awilda Metro M.D.   On: 03/19/2016 00:18   Mr Brain Wo Contrast  Result Date: 03/19/2016 CLINICAL DATA:  Headache for month, 2 days of RIGHT neck and throat pain. RIGHT-sided body weakness for 2 days. History of herniated disc with similar neck pain. Status post ACDF December 2016. EXAM: MRI HEAD WITHOUT CONTRAST MRI CERVICAL SPINE WITHOUT CONTRAST TECHNIQUE: Multiplanar, multiecho pulse sequences of the brain and surrounding structures, and cervical spine, to include the craniocervical junction and cervicothoracic junction, were obtained without intravenous contrast. COMPARISON:  CT HEAD March 10, 2016 and MRI cervical spine April 13, 2015 FINDINGS: MRI  HEAD FINDINGS BRAIN: No reduced diffusion to suggest acute ischemia. No susceptibility artifact to suggest hemorrhage. The ventricles and sulci are normal for patient's age. No suspicious parenchymal signal, masses or mass effect. No abnormal extra-axial fluid collections. No extra-axial masses though, contrast enhanced sequences would be more sensitive. VASCULAR: Normal major intracranial vascular flow voids present at skull base. SKULL AND UPPER CERVICAL SPINE: No abnormal sellar expansion. No suspicious calvarial bone marrow signal. Craniocervical junction maintained. SINUSES/ORBITS: RIGHT sphenoid mucosal thickening. Mastoid air cells are well aerated. The included ocular globes and orbital contents are non-suspicious. OTHER: None. MRI CERVICAL SPINE FINDINGS ALIGNMENT: Maintained cervical lordosis.  No malalignment.  VERTEBRAE/DISCS: Vertebral bodies are intact. Susceptibility artifact C5-6 and C6-7 compatible with the ACDF. Non surgically altered discs demonstrate normal morphology and signal. No suspicious bone marrow signal. CORD:Cervical spinal cord is normal morphology and signal characteristics from the cervicomedullary junction to level of T1-2, the most caudal well visualized level though, field inhomogeneity in lower cervical spine limits evaluation. POSTERIOR FOSSA, VERTEBRAL ARTERIES, PARASPINAL TISSUES: No MR findings of ligamentous injury. Vertebral artery flow voids present. Included posterior fossa and paraspinal soft tissues are nonacute. 5 mm LEFT thyroid nodule below size followup recommendation. DISC LEVELS: C2-3, C3-4: No disc bulge, canal stenosis nor neural foraminal narrowing. C4-5: Annular bulging asymmetric to the RIGHT. Mild facet arthropathy without canal stenosis. Mild possible RIGHT neural foraminal narrowing or, hardware artifact. C5-6 and C6-7: ACDF, hardware artifact. No canal stenosis. Moderate bilateral C5-6 and LEFT C6-7 neural foraminal narrowing. IMPRESSION: MRI HEAD:   Negative. MRI CERVICAL SPINE:  No acute process.  Interval C5-6 and C6-7 ACDF. No canal stenosis. Suspected moderate C5-6 and C6-7 neural foraminal narrowing could be overestimated by hardware artifact. Mild possible RIGHT C4-5 neural foraminal narrowing. Electronically Signed   By: Awilda Metroourtnay  Bloomer M.D.   On: 03/19/2016 02:30   Mr Cervical Spine Wo Contrast  Result Date: 03/19/2016 CLINICAL DATA:  Headache for month, 2 days of RIGHT neck and throat pain. RIGHT-sided body weakness for 2 days. History of herniated disc with similar neck pain. Status post ACDF December 2016. EXAM: MRI HEAD WITHOUT CONTRAST MRI CERVICAL SPINE WITHOUT CONTRAST TECHNIQUE: Multiplanar, multiecho pulse sequences of the brain and surrounding structures, and cervical spine, to include the craniocervical junction and cervicothoracic junction, were obtained without intravenous contrast. COMPARISON:  CT HEAD March 10, 2016 and MRI cervical spine April 13, 2015 FINDINGS: MRI HEAD FINDINGS BRAIN: No reduced diffusion to suggest acute ischemia. No susceptibility artifact to suggest hemorrhage. The ventricles and sulci are normal for patient's age. No suspicious parenchymal signal, masses or mass effect. No abnormal extra-axial fluid collections. No extra-axial masses though, contrast enhanced sequences would be more sensitive. VASCULAR: Normal major intracranial vascular flow voids present at skull base. SKULL AND UPPER CERVICAL SPINE: No abnormal sellar expansion. No suspicious calvarial bone marrow signal. Craniocervical junction maintained. SINUSES/ORBITS: RIGHT sphenoid mucosal thickening. Mastoid air cells are well aerated. The included ocular globes and orbital contents are non-suspicious. OTHER: None. MRI CERVICAL SPINE FINDINGS ALIGNMENT: Maintained cervical lordosis.  No malalignment. VERTEBRAE/DISCS: Vertebral bodies are intact. Susceptibility artifact C5-6 and C6-7 compatible with the ACDF. Non surgically altered discs  demonstrate normal morphology and signal. No suspicious bone marrow signal. CORD:Cervical spinal cord is normal morphology and signal characteristics from the cervicomedullary junction to level of T1-2, the most caudal well visualized level though, field inhomogeneity in lower cervical spine limits evaluation. POSTERIOR FOSSA, VERTEBRAL ARTERIES, PARASPINAL TISSUES: No MR findings of ligamentous injury. Vertebral artery flow voids present. Included posterior fossa and paraspinal soft tissues are nonacute. 5 mm LEFT thyroid nodule below size followup recommendation. DISC LEVELS: C2-3, C3-4: No disc bulge, canal stenosis nor neural foraminal narrowing. C4-5: Annular bulging asymmetric to the RIGHT. Mild facet arthropathy without canal stenosis. Mild possible RIGHT neural foraminal narrowing or, hardware artifact. C5-6 and C6-7: ACDF, hardware artifact. No canal stenosis. Moderate bilateral C5-6 and LEFT C6-7 neural foraminal narrowing. IMPRESSION: MRI HEAD:  Negative. MRI CERVICAL SPINE:  No acute process.  Interval C5-6 and C6-7 ACDF. No canal stenosis. Suspected moderate C5-6 and C6-7 neural foraminal narrowing could be overestimated by hardware artifact. Mild possible RIGHT  C4-5 neural foraminal narrowing. Electronically Signed   By: Awilda Metro M.D.   On: 03/19/2016 02:30    ____________________________________________   PROCEDURES  Procedure(s) performed:   Procedures   Critical Care performed: No ____________________________________________   INITIAL IMPRESSION / ASSESSMENT AND PLAN / ED COURSE  Pertinent labs & imaging results that were available during my care of the patient were reviewed by me and considered in my medical decision making (see chart for details).  Signs and symptoms are most consistent with cervical radiculopathy.  I can appreciate no muscle weakness at this time.  Given her history of surgery on the cervical spine as she has some inflammation that is causing the  radiculopathy.  I think CVA/TIA is incredibly unlikely and given her age and general health and the presenting symptoms of headache and neck pain radiating down into her left arm and makes a vascular cause such as a carotid dissection very unlikely as well.  Given the persistence of her headache and her report of subjective right upper extremity weakness (though it is no longer present) I will evaluate with an MR brain without contrast to make sure I am not missing a CVA and we will evaluate with an MR cervical spine noncontrast.  I anticipate that she will need follow-up as an outpatient but this will help rule out acute or emergent causes of her symptoms.  In the meantime also treat her for migraine as this gave her some relief previously.   Clinical Course  Comment By Time  The patient has been resting comfortably.  Her MRIs were reassuring with no evidence of acute or emergent pathology.  I discussed extensively with the patient that she should follow-up with her prior neurosurgeon as well as with the headache which she was previously referred.  She is comfortable with this plan.I gave my usual and customary return precautions.  Loleta Rose, MD 10/05 [phone]    ____________________________________________  FINAL CLINICAL IMPRESSION(S) / ED DIAGNOSES  Final diagnoses:  Chronic nonintractable headache, unspecified headache type  Neck pain  Cervical radiculopathy     MEDICATIONS GIVEN DURING THIS VISIT:  Medications  ketorolac (TORADOL) 30 MG/ML injection 15 mg (15 mg Intravenous Given 03/19/16 0034)  diphenhydrAMINE (BENADRYL) injection 12.5 mg (12.5 mg Intravenous Given 03/19/16 0035)  metoCLOPramide (REGLAN) injection 10 mg (10 mg Intravenous Given 03/19/16 0034)  dexamethasone (DECADRON) injection 10 mg (10 mg Intravenous Given 03/19/16 0034)  sodium chloride 0.9 % bolus 500 mL (0 mLs Intravenous Stopped 03/19/16 0249)     NEW OUTPATIENT MEDICATIONS STARTED DURING THIS  VISIT:  Discharge Medication List as of 03/19/2016  3:02 AM      Discharge Medication List as of 03/19/2016  3:02 AM      Discharge Medication List as of 03/19/2016  3:02 AM       Note:  This document was prepared using Dragon voice recognition software and may include unintentional dictation errors.    Loleta Rose, MD 03/19/16 [phone]

## 2016-03-18 NOTE — ED Triage Notes (Signed)
Pt reports HA x1 month, seen at [facility], sent home with medication (Unknown.) HA's have increased and pt sts that 03/17/16 pt started to develop right neck and throat pain. Pt also sts she has had weakness on the right side of body x2 days. Pt has a hx of herniated desk, with similar neck pain.

## 2016-03-19 ENCOUNTER — Emergency Department: Payer: BLUE CROSS/BLUE SHIELD

## 2016-03-19 DIAGNOSIS — M542 Cervicalgia: Secondary | ICD-10-CM | POA: Diagnosis not present

## 2016-03-19 DIAGNOSIS — R51 Headache: Secondary | ICD-10-CM | POA: Diagnosis not present

## 2016-03-19 NOTE — ED Notes (Signed)

## 2016-03-19 NOTE — Discharge Instructions (Signed)
As we discussed, your workup today was reassuring.  Though we do not know exactly what is causing your symptoms, it appears that you have no emergent medical condition at this time are safe to go home and follow up as recommended in this paperwork. ° °Please return immediately to the Emergency Department if you develop any new or worsening symptoms that concern you. ° °

## 2016-03-19 NOTE — ED Notes (Addendum)
Pt to MRI via stretcher.

## 2016-03-27 ENCOUNTER — Encounter: Payer: Self-pay | Admitting: Family Medicine

## 2016-03-27 ENCOUNTER — Ambulatory Visit (INDEPENDENT_AMBULATORY_CARE_PROVIDER_SITE_OTHER): Payer: BLUE CROSS/BLUE SHIELD | Admitting: Family Medicine

## 2016-03-27 VITALS — BP 126/76 | HR 76 | Temp 98.5°F | Ht 64.5 in | Wt 149.0 lb

## 2016-03-27 DIAGNOSIS — M542 Cervicalgia: Secondary | ICD-10-CM

## 2016-03-27 DIAGNOSIS — R51 Headache: Secondary | ICD-10-CM | POA: Diagnosis not present

## 2016-03-27 DIAGNOSIS — R519 Headache, unspecified: Secondary | ICD-10-CM | POA: Insufficient documentation

## 2016-03-27 DIAGNOSIS — M791 Myalgia, unspecified site: Secondary | ICD-10-CM | POA: Insufficient documentation

## 2016-03-27 DIAGNOSIS — G8929 Other chronic pain: Secondary | ICD-10-CM | POA: Insufficient documentation

## 2016-03-27 DIAGNOSIS — R5383 Other fatigue: Secondary | ICD-10-CM

## 2016-03-27 LAB — TSH: TSH: 1.63 u[IU]/mL (ref 0.35–4.50)

## 2016-03-27 LAB — T3, FREE: T3 FREE: 3.8 pg/mL (ref 2.3–4.2)

## 2016-03-27 LAB — T4, FREE: FREE T4: 0.74 ng/dL (ref 0.60–1.60)

## 2016-03-27 MED ORDER — IBUPROFEN 800 MG PO TABS: 800.0000 mg | ORAL_TABLET | Freq: Three times a day (TID) | ORAL | 1 refills | Status: DC | PRN

## 2016-03-27 MED ORDER — GABAPENTIN 100 MG PO CAPS: 100.0000 mg | ORAL_CAPSULE | Freq: Every day | ORAL | 3 refills | Status: DC

## 2016-03-27 NOTE — Assessment & Plan Note (Signed)
?   If migraine variant.  Consider migraine treatment if not better with treatment of neuropathic pain from neck.

## 2016-03-27 NOTE — Assessment & Plan Note (Signed)
Likely related to new injury of Cspine with job changes and fall. Stop heavy lifting.  Make appt with neurosurgery.  Start ibuprofen and trial of gabapentin at bedtime.

## 2016-03-27 NOTE — Patient Instructions (Signed)
Stop at lab on way out for thyroid test.  Make appt with neuro surgeon.  Start ibuprofen 800 mg three times daily as needed for pain .  Start Neurontin at bedtime for neuropathic pain.

## 2016-03-27 NOTE — Assessment & Plan Note (Signed)
Unclear cause.? Viral syndrome.  Eval TSH.  Other labs including cbc and CMET nml at ER.

## 2016-03-27 NOTE — Progress Notes (Signed)
Subjective:    Patient ID: Deborah Navarro, female    DOB: 1981/03/01, 35 y.o.   MRN: [id number]  HPI  35 year old female presents for ER follow up.  Seen on 9/26 and 10/4 for severe intractable headache. Ongoing since August. NML  Cbc, CMET, troponin i Head CT: neg  MRI brain and neck: nml except, Suspected moderate C5-6 and C6-7 neural foraminal narrowing could be overestimated by hardware artifact. Mild possible RIGHT C4-5 neural foraminal narrowing.  [facility].. she is trying to get into her neurosurgeon.  Had neck surgery 05/2016.  After surgery was doing well. Was doing PT.  Since then pain in left lateral face and neck, pain comes a goes severe at times. PAin has worsened since 01/2016. Oc sens to light and sound.  She is nauseous. Decreased appetite. She feels tired, achy in arms, tingling in legs Legs achy. No fever. Pain is keeping her up at night.  She has been working third shift so this has interfered with  Sleep... She has also been doing a whole lot more lifting.  Larey SeatFell on  But and left wrist 12/2015 After these  pain in neck started back.  Muscle relaxant did not help. She has been using ibuprofen 600 mg using rarely... Helps some with pain.   Review of Systems  Constitutional: Positive for fatigue. Negative for fever.  HENT: Negative for ear pain.   Eyes: Negative for pain.  Respiratory: Negative for chest tightness and shortness of breath.   Cardiovascular: Negative for chest pain, palpitations and leg swelling.  Gastrointestinal: Negative for abdominal pain.  Genitourinary: Negative for dysuria.       Objective:   Physical Exam  Constitutional: Vital signs are normal. She appears well-developed and well-nourished. She is cooperative.  Non-toxic appearance. She does not appear ill. No distress.  HENT:  Head: Normocephalic.  Right Ear: Hearing, tympanic membrane, external ear and ear canal normal. Tympanic membrane is not  erythematous, not retracted and not bulging.  Left Ear: Hearing, tympanic membrane, external ear and ear canal normal. Tympanic membrane is not erythematous, not retracted and not bulging.  Nose: No mucosal edema or rhinorrhea. Right sinus exhibits no maxillary sinus tenderness and no frontal sinus tenderness. Left sinus exhibits no maxillary sinus tenderness and no frontal sinus tenderness.  Mouth/Throat: Uvula is midline, oropharynx is clear and moist and mucous membranes are normal.  Eyes: Conjunctivae, EOM and lids are normal. Pupils are equal, round, and reactive to light. Lids are everted and swept, no foreign bodies found.  Neck: Neck supple. Spinous process tenderness and muscular tenderness present. Carotid bruit is not present. Decreased range of motion present. No thyroid mass and no thyromegaly present.  Cardiovascular: Normal rate, regular rhythm, S1 normal, S2 normal, normal heart sounds, intact distal pulses and normal pulses.  Exam reveals no gallop and no friction rub.   No murmur heard. Pulmonary/Chest: Effort normal and breath sounds normal. No tachypnea. No respiratory distress. She has no decreased breath sounds. She has no wheezes. She has no rhonchi. She has no rales.  Abdominal: Soft. Normal appearance and bowel sounds are normal. There is no tenderness.  Neurological: She is alert. She has normal strength. No cranial nerve deficit or sensory deficit. She displays a negative Romberg sign. Coordination and gait normal.  ttp over vertebral spine at C4-C7 and at base of occiput.  Skin: Skin is warm, dry and intact. No rash noted.  Psychiatric: Her speech is normal and  behavior is normal. Judgment and thought content normal. Her mood appears not anxious. Cognition and memory are normal. She does not exhibit a depressed mood.          Assessment & Plan:

## 2016-03-27 NOTE — Progress Notes (Signed)
Pre visit review using our clinic review tool, if applicable. No additional management support is needed unless otherwise documented below in the visit note. 

## 2016-03-30 DIAGNOSIS — Z6825 Body mass index (BMI) 25.0-25.9, adult: Secondary | ICD-10-CM | POA: Diagnosis not present

## 2016-03-30 DIAGNOSIS — Z13828 Encounter for screening for other musculoskeletal disorder: Secondary | ICD-10-CM | POA: Diagnosis not present

## 2017-07-30 IMAGING — MR MR CERVICAL SPINE W/O CM
6 series · 39 of 48 positions shown · non-contrast
Comparison: CT HEAD March 10, 2016 and MRI cervical spine
April 13, 2015

CLINICAL DATA: Headache for month, 2 days of RIGHT neck and throat
pain. RIGHT-sided body weakness for 2 days. History of herniated
disc with similar neck pain. Status post ACDF May 2015.

EXAM:
MRI HEAD WITHOUT CONTRAST
MRI CERVICAL SPINE WITHOUT CONTRAST
TECHNIQUE: Multiplanar, multiecho pulse sequences of the brain and surrounding
structures, and cervical spine, to include the craniocervical
junction and cervicothoracic junction, were obtained without
intravenous contrast.

[Series 2: T2 · sagittal · 3.0mm · 0.70mm/px · 5 of 15 slices shown (1 of 2)]
[im 1/15]
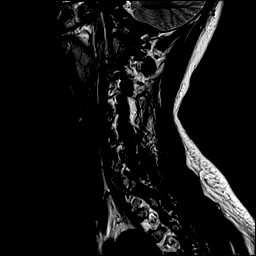
[im 4/15]
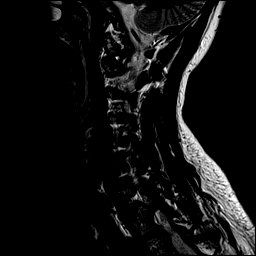
[im 8/15]
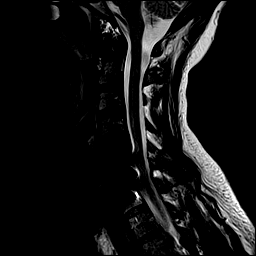
[im 11/15]
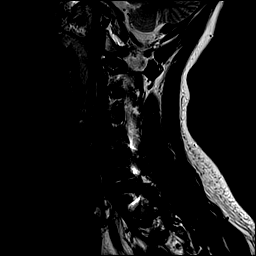
[im 15/15]
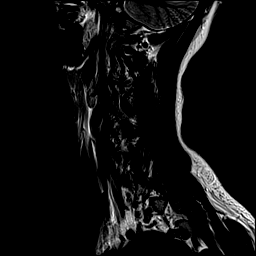

[Series 3: T1 · sagittal · 3.0mm · 0.70mm/px · 5 of 15 slices shown (1 of 2)]
[im 1/15]
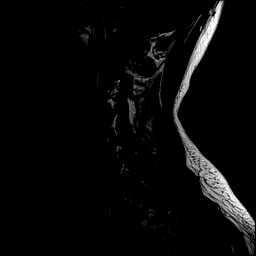
[im 4/15]
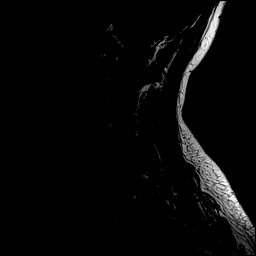
[im 8/15]
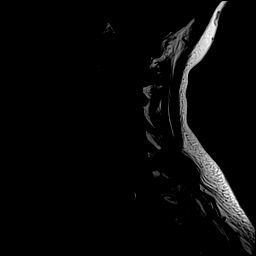
[im 11/15]
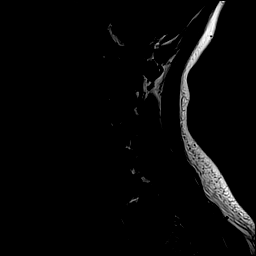
[im 15/15]
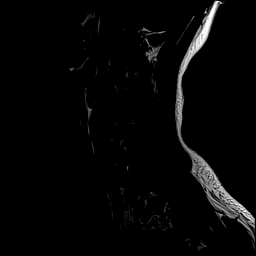

[Series 4: STIR · sagittal · 3.0mm · 0.35mm/px · 5 of 15 slices shown]
[im 1/15]
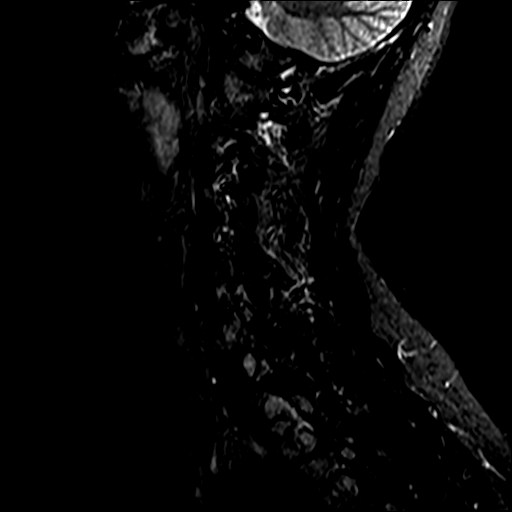
[im 4/15]
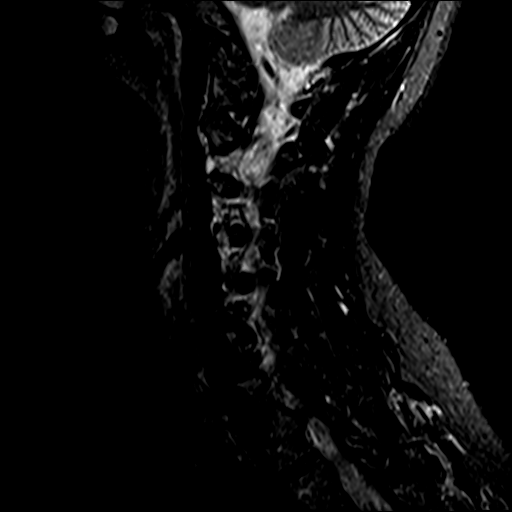
[im 8/15]
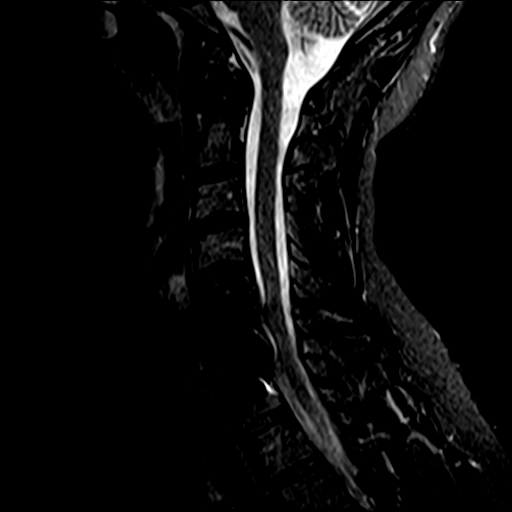
[im 11/15]
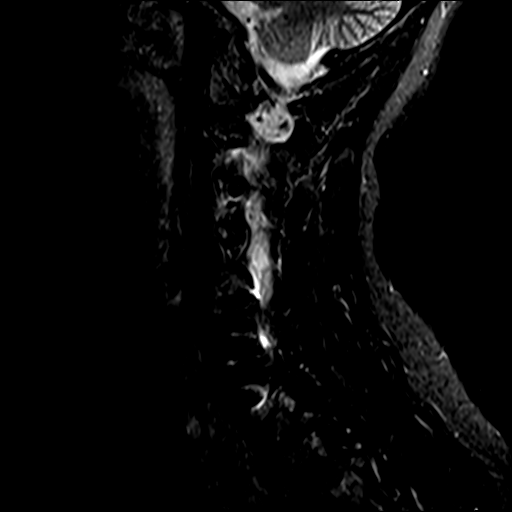
[im 15/15]
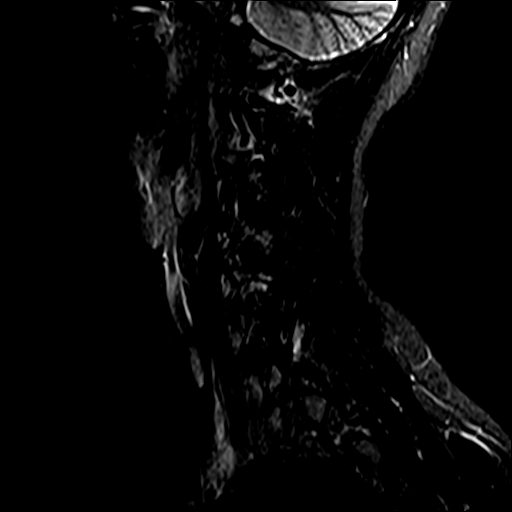

[Series 5: T2 · axial · 3.0mm · 0.70mm/px · z∈[-101,+5]mm · 11 of 29 slices shown (2 of 2)]
[im 1/29]
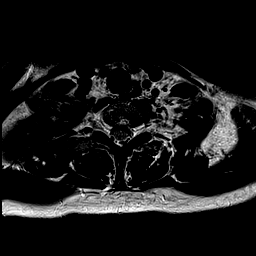
[im 3/29]
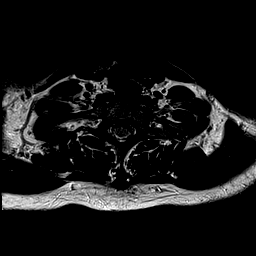
[im 6/29]
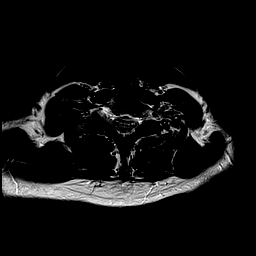
[im 9/29]
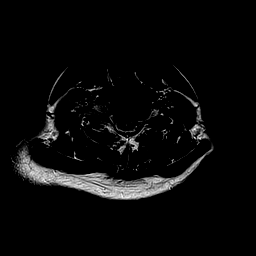
[im 12/29]
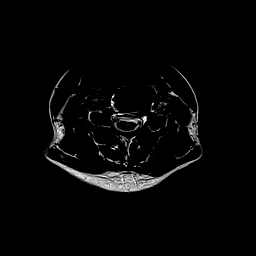
[im 15/29]
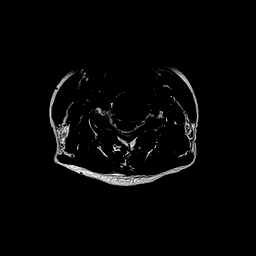
[im 17/29]
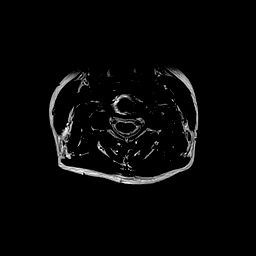
[im 20/29]
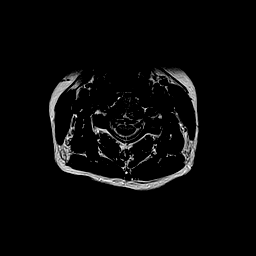
[im 23/29]
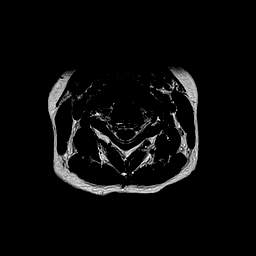
[im 26/29]
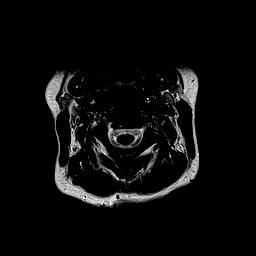
[im 29/29]
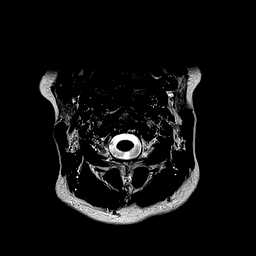

[Series 6: mpgr ax · axial · 3.0mm · 0.35mm/px · z∈[-101,-82]mm · 2 of 29 slices shown]
[im 1/29]
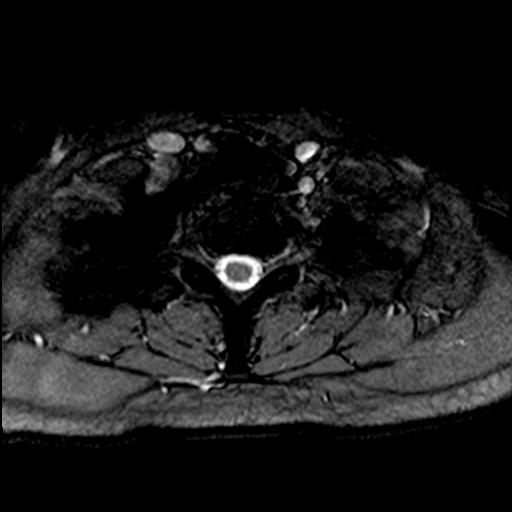
[im 6/29]
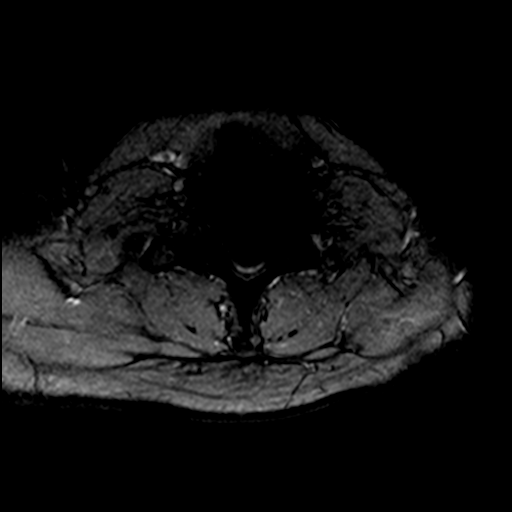

[Series 7: T1 · axial · 3.0mm · 0.56mm/px · z∈[-101,+5]mm · 11 of 29 slices shown (2 of 2)]
[im 1/29]
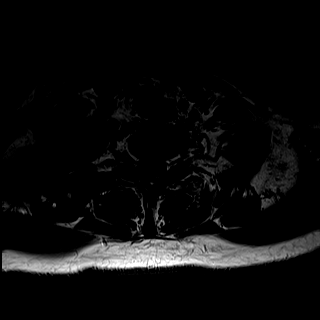
[im 3/29]
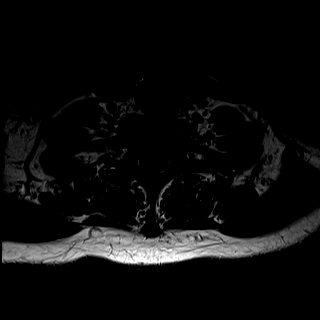
[im 6/29]
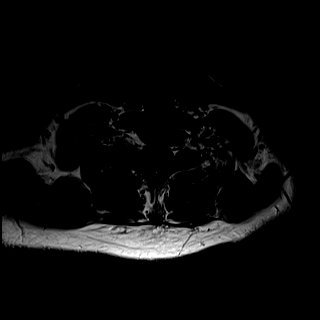
[im 9/29]
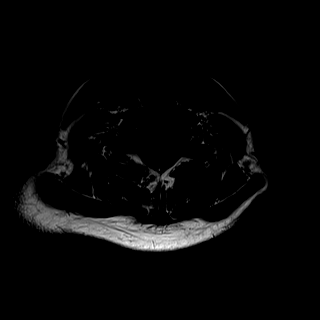
[im 12/29]
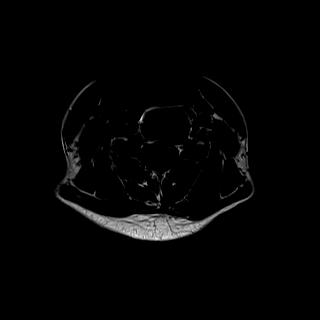
[im 15/29]
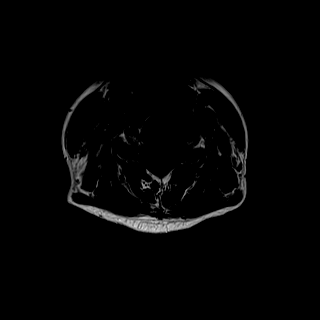
[im 17/29]
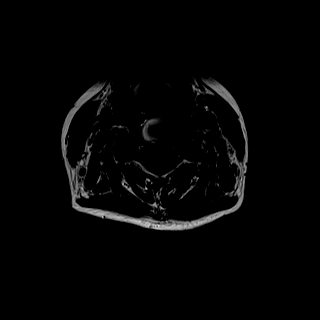
[im 20/29]
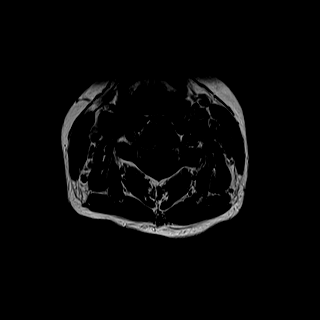
[im 23/29]
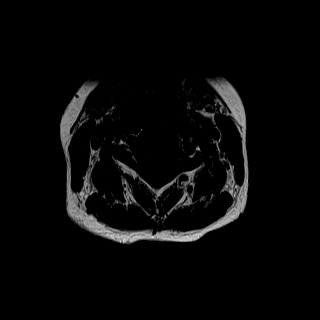
[im 26/29]
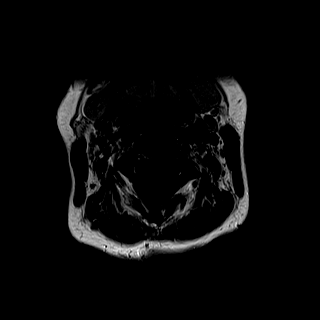
[im 29/29]
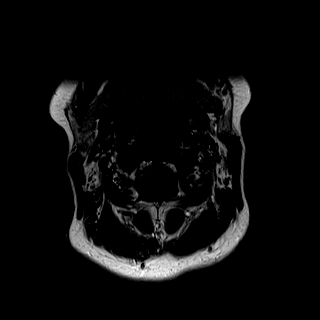

[39 of 48 positions shown; findings below may reference images not displayed]

FINDINGS: MRI HEAD FINDINGS

BRAIN: No reduced diffusion to suggest acute ischemia. No
susceptibility artifact to suggest hemorrhage. The ventricles and
sulci are normal for patient's age. No suspicious parenchymal
signal, masses or mass effect. No abnormal extra-axial fluid
collections. No extra-axial masses though, contrast enhanced
sequences would be more sensitive.

VASCULAR: Normal major intracranial vascular flow voids present at
skull base.

SKULL AND UPPER CERVICAL SPINE: No abnormal sellar expansion. No
suspicious calvarial bone marrow signal. Craniocervical junction
maintained.

SINUSES/ORBITS: RIGHT sphenoid mucosal thickening. Mastoid air cells
are well aerated. The included ocular globes and orbital contents
are non-suspicious.

OTHER: None.

MRI CERVICAL SPINE FINDINGS

ALIGNMENT: Maintained cervical lordosis.  No malalignment.

VERTEBRAE/DISCS: Vertebral bodies are intact. Susceptibility
artifact C5-6 and C6-7 compatible with the ACDF. Non surgically
altered discs demonstrate normal morphology and signal. No
suspicious bone marrow signal.

CORD:Cervical spinal cord is normal morphology and signal
characteristics from the cervicomedullary junction to level of T1-2,
the most caudal well visualized level though, field inhomogeneity in
lower cervical spine limits evaluation.

POSTERIOR FOSSA, VERTEBRAL ARTERIES, PARASPINAL TISSUES: No MR
findings of ligamentous injury. Vertebral artery flow voids present.
Included posterior fossa and paraspinal soft tissues are nonacute. 5
mm LEFT thyroid nodule below size followup recommendation.

DISC LEVELS:

C2-3, C3-4: No disc bulge, canal stenosis nor neural foraminal
narrowing.

C4-5: Annular bulging asymmetric to the RIGHT. Mild facet
arthropathy without canal stenosis. Mild possible RIGHT neural
foraminal narrowing or, hardware artifact.

C5-6 and C6-7: ACDF, hardware artifact. No canal stenosis. Moderate
bilateral C5-6 and LEFT C6-7 neural foraminal narrowing.
IMPRESSION: MRI HEAD:  Negative.

MRI CERVICAL SPINE:  No acute process.  Interval C5-6 and C6-7 ACDF.

No canal stenosis. Suspected moderate C5-6 and C6-7 neural foraminal
narrowing could be overestimated by hardware artifact. Mild possible
RIGHT C4-5 neural foraminal narrowing.

## 2017-10-04 ENCOUNTER — Emergency Department: Payer: BLUE CROSS/BLUE SHIELD

## 2017-10-04 ENCOUNTER — Encounter: Payer: Self-pay | Admitting: Emergency Medicine

## 2017-10-04 ENCOUNTER — Emergency Department
Admission: EM | Admit: 2017-10-04 | Discharge: 2017-10-04 | Disposition: A | Discharge status: Home / Self Care | Payer: BLUE CROSS/BLUE SHIELD | Attending: Student in an Organized Health Care Education/Training Program | Admitting: Student in an Organized Health Care Education/Training Program

## 2017-10-04 ENCOUNTER — Other Ambulatory Visit: Payer: Self-pay

## 2017-10-04 DIAGNOSIS — F329 Major depressive disorder, single episode, unspecified: Secondary | ICD-10-CM | POA: Diagnosis not present

## 2017-10-04 DIAGNOSIS — R1011 Right upper quadrant pain: Secondary | ICD-10-CM | POA: Insufficient documentation

## 2017-10-04 DIAGNOSIS — R110 Nausea: Secondary | ICD-10-CM | POA: Diagnosis not present

## 2017-10-04 DIAGNOSIS — Z79899 Other long term (current) drug therapy: Secondary | ICD-10-CM | POA: Diagnosis not present

## 2017-10-04 DIAGNOSIS — R82998 Other abnormal findings in urine: Secondary | ICD-10-CM | POA: Diagnosis not present

## 2017-10-04 DIAGNOSIS — R8290 Unspecified abnormal findings in urine: Secondary | ICD-10-CM | POA: Diagnosis not present

## 2017-10-04 LAB — URINALYSIS, COMPLETE (UACMP) WITH MICROSCOPIC
BILIRUBIN URINE: NEGATIVE
Glucose, UA: NEGATIVE mg/dL
Hgb urine dipstick: NEGATIVE
KETONES UR: NEGATIVE mg/dL
Nitrite: NEGATIVE
PH: 6 (ref 5.0–8.0)
PROTEIN: NEGATIVE mg/dL
Specific Gravity, Urine: 1.021 (ref 1.005–1.030)

## 2017-10-04 LAB — COMPREHENSIVE METABOLIC PANEL
ALT: 12 U/L — ABNORMAL LOW (ref 14–54)
AST: 15 U/L (ref 15–41)
Albumin: 4.4 g/dL (ref 3.5–5.0)
Alkaline Phosphatase: 47 U/L (ref 38–126)
Anion gap: 6 (ref 5–15)
BUN: 15 mg/dL (ref 6–20)
CHLORIDE: 102 mmol/L (ref 101–111)
CO2: 28 mmol/L (ref 22–32)
Calcium: 9.1 mg/dL (ref 8.9–10.3)
Creatinine, Ser: 0.85 mg/dL (ref 0.44–1.00)
Glucose, Bld: 86 mg/dL (ref 65–99)
POTASSIUM: 3.9 mmol/L (ref 3.5–5.1)
SODIUM: 136 mmol/L (ref 135–145)
Total Bilirubin: 0.5 mg/dL (ref 0.3–1.2)
Total Protein: 7.7 g/dL (ref 6.5–8.1)

## 2017-10-04 LAB — CBC
HEMATOCRIT: 34.4 % — AB (ref 35.0–47.0)
Hemoglobin: 11.8 g/dL — ABNORMAL LOW (ref 12.0–16.0)
MCH: 30.4 pg (ref 26.0–34.0)
MCHC: 34.2 g/dL (ref 32.0–36.0)
MCV: 88.9 fL (ref 80.0–100.0)
Platelets: 227 10*3/uL (ref 150–440)
RBC: 3.87 MIL/uL (ref 3.80–5.20)
RDW: 13.4 % (ref 11.5–14.5)
WBC: 5 10*3/uL (ref 3.6–11.0)

## 2017-10-04 LAB — POCT PREGNANCY, URINE: Preg Test, Ur: NEGATIVE

## 2017-10-04 LAB — LIPASE, BLOOD: LIPASE: 29 U/L (ref 11–51)

## 2017-10-04 MED ORDER — HYDROCODONE-ACETAMINOPHEN 5-325 MG PO TABS
1.0000 | ORAL_TABLET | Freq: Once | ORAL | Status: AC
  Administered 2017-10-04: 1 via ORAL

## 2017-10-04 MED ORDER — KETOROLAC TROMETHAMINE 30 MG/ML IJ SOLN
15.0000 mg | Freq: Once | INTRAMUSCULAR | Status: AC
  Administered 2017-10-04: 15 mg via INTRAMUSCULAR

## 2017-10-04 MED ORDER — HYDROCODONE-ACETAMINOPHEN 5-325 MG PO TABS: 1.0000 | ORAL_TABLET | ORAL | 0 refills | Status: DC | PRN

## 2017-10-04 MED ORDER — KETOROLAC TROMETHAMINE 30 MG/ML IJ SOLN
INTRAMUSCULAR | Status: AC
  Filled 2017-10-04: qty 1

## 2017-10-04 MED ORDER — HYDROCODONE-ACETAMINOPHEN 5-325 MG PO TABS
ORAL_TABLET | ORAL | Status: AC
  Filled 2017-10-04: qty 1

## 2017-10-04 MED ORDER — PROMETHAZINE HCL 12.5 MG PO TABS: 12.5000 mg | ORAL_TABLET | Freq: Four times a day (QID) | ORAL | 0 refills | Status: DC | PRN

## 2017-10-04 NOTE — ED Provider Notes (Signed)
[facility] Emergency Department Provider Note    First MD Initiated Contact with Patient 10/04/17 1558     (approximate)  I have reviewed the triage vital signs and the nursing notes.   HISTORY  Chief Complaint Abdominal Pain    HPI Deborah Navarro is a 37 y.o. female presents with a chief complaint of right upper quadrant abdominal pain radiating through to her back since Thursday.  It is worse with movement.  Denies any fevers.  No nausea or vomiting.  Rates the pain is mild to moderate in severity.  Has had somewhat similar episodes in the past.  Has a history of endometriosis and has had extensive workup of her gallbladder.  No known family history of IBD.  No known history of kidney stones.  Denies any dysuria or diarrhea.  Past Medical History:  Diagnosis Date  . Anemia, unspecified   . Blood transfusion, without reported diagnosis   . Depressive disorder, not elsewhere classified   . Elevated blood pressure reading without diagnosis of hypertension   . Endometriosis of fallopian tube   . Irritable bowel syndrome   . Streptococcal meningitis   . Undiagnosed cardiac murmurs   . Unspecified personal history presenting hazards to health    Family History  Problem Relation Age of Onset  . Cancer Mother [id number]       non hodgkins lymphoma(mets to brain)  . Coronary artery disease Father   . Hypertension Father   . Hyperlipidemia Father   . Diabetes Father    Past Surgical History:  Procedure Laterality Date  . DILATION AND CURETTAGE OF UTERUS    . LAPAROSCOPIC ENDOMETRIOSIS FULGURATION  10-1999   endometriosis  . NECK SURGERY N/A 06/14/15   due to C5/6 C6-7 disc herniation   . open heart surgery  12-1994   asd repair   Patient Active Problem List   Diagnosis Date Noted  . Acute neck pain 03/27/2016  . Myalgia 03/27/2016  . Left-sided headache 03/27/2016  . Family history of early CAD 03/15/2015  . COLONIC POLYPS 06/30/2010  . ANEMIA  06/30/2010  . Depression, major, in remission (HCC) 06/30/2010  . Hx of MENINGITIS 06/30/2010  . ENDOMETRIOSIS 06/30/2010  . CARDIAC MURMUR 06/30/2010  . PAP SMEAR, ABNORMAL 06/30/2010  . CHICKENPOX, HX OF 06/30/2010      Prior to Admission medications   Medication Sig Start Date End Date Taking? Authorizing Provider  gabapentin (NEURONTIN) 100 MG capsule Take 1 capsule (100 mg total) by mouth at bedtime. 03/27/16   Bedsole, Amy E, MD  ibuprofen (ADVIL,MOTRIN) 800 MG tablet Take 1 tablet (800 mg total) by mouth every 8 (eight) hours as needed. 03/27/16   Bedsole, Amy E, MD  loratadine (CLARITIN) 10 MG tablet Take 1 tablet (10 mg total) by mouth daily. 03/10/16   Loren RacerYelverton, David, MD  methocarbamol (ROBAXIN) 500 MG tablet Take 2 tablets (1,000 mg total) by mouth every 8 (eight) hours as needed for muscle spasms. 03/10/16   Loren RacerYelverton, David, MD  metoCLOPramide (REGLAN) 10 MG tablet Take 1 tablet (10 mg total) by mouth every 6 (six) hours as needed for nausea (headache). 03/10/16   Loren RacerYelverton, David, MD  mometasone (NASONEX) 50 MCG/ACT nasal spray Place 2 sprays into the nose daily. 03/10/16   Loren RacerYelverton, David, MD  oxymetazoline (AFRIN NASAL SPRAY) 0.05 % nasal spray Place 1 spray into both nostrils 2 (two) times daily. 03/10/16   Loren RacerYelverton, David, MD    Allergies Patient has no known allergies.  Social History Social History   Tobacco Use  . Smoking status: Never Smoker  . Smokeless tobacco: Never Used  Substance Use Topics  . Alcohol use: Yes    Alcohol/week: 0.6 oz    Types: 1 Shots of liquor per week  . Drug use: No    Review of Systems Patient denies headaches, rhinorrhea, blurry vision, numbness, shortness of breath, chest pain, edema, cough, abdominal pain, nausea, vomiting, diarrhea, dysuria, fevers, rashes or hallucinations unless otherwise stated above in HPI. ____________________________________________   PHYSICAL EXAM:  VITAL SIGNS: Vitals:   10/04/17 1514  BP:  (!) 146/76  Pulse: 72  Resp: 16  Temp: 98.5 F (36.9 C)  SpO2: 100%    Constitutional: Alert and oriented. Well appearing and in no acute distress. Eyes: Conjunctivae are normal.  Head: Atraumatic. Nose: No congestion/rhinnorhea. Mouth/Throat: Mucous membranes are moist.   Neck: No stridor. Painless ROM.  Cardiovascular: Normal rate, regular rhythm. Grossly normal heart sounds.  Good peripheral circulation. Respiratory: Normal respiratory effort.  No retractions. Lungs CTAB. Gastrointestinal: Soft with mild ruq ttp. No distention. No abdominal bruits. No CVA tenderness. Genitourinary:  Musculoskeletal: No lower extremity tenderness nor edema.  No joint effusions. Neurologic:  Normal speech and language. No gross focal neurologic deficits are appreciated. No facial droop Skin:  Skin is warm, dry and intact. No rash noted. Psychiatric: Mood and affect are normal. Speech and behavior are normal.  ____________________________________________   LABS (all labs ordered are listed, but only abnormal results are displayed)  Results for orders placed or performed during the hospital encounter of 10/04/17 (from the past 24 hour(s))  Lipase, blood     Status: None   Collection Time: 10/04/17  3:20 PM  Result Value Ref Range   Lipase 29 11 - 51 U/L  Comprehensive metabolic panel     Status: Abnormal   Collection Time: 10/04/17  3:20 PM  Result Value Ref Range   Sodium 136 135 - 145 mmol/L   Potassium 3.9 3.5 - 5.1 mmol/L   Chloride 102 101 - 111 mmol/L   CO2 28 22 - 32 mmol/L   Glucose, Bld 86 65 - 99 mg/dL   BUN 15 6 - 20 mg/dL   Creatinine, Ser 1.61 0.44 - 1.00 mg/dL   Calcium 9.1 8.9 - 09.6 mg/dL   Total Protein 7.7 6.5 - 8.1 g/dL   Albumin 4.4 3.5 - 5.0 g/dL   AST 15 15 - 41 U/L   ALT 12 (L) 14 - 54 U/L   Alkaline Phosphatase 47 38 - 126 U/L   Total Bilirubin 0.5 0.3 - 1.2 mg/dL   GFR calc non Af Amer >60 >60 mL/min   GFR calc Af Amer >60 >60 mL/min   Anion gap 6 5 - 15    CBC     Status: Abnormal   Collection Time: 10/04/17  3:20 PM  Result Value Ref Range   WBC 5.0 3.6 - 11.0 K/uL   RBC 3.87 3.80 - 5.20 MIL/uL   Hemoglobin 11.8 (L) 12.0 - 16.0 g/dL   HCT 04.5 (L) 40.9 - 81.1 %   MCV 88.9 80.0 - 100.0 fL   MCH 30.4 26.0 - 34.0 pg   MCHC 34.2 32.0 - 36.0 g/dL   RDW 91.4 78.2 - 95.6 %   Platelets 227 150 - 440 K/uL  Urinalysis, Complete w Microscopic     Status: Abnormal   Collection Time: 10/04/17  3:20 PM  Result Value Ref Range   Color, Urine  YELLOW (A) YELLOW   APPearance CLOUDY (A) CLEAR   Specific Gravity, Urine 1.021 1.005 - 1.030   pH 6.0 5.0 - 8.0   Glucose, UA NEGATIVE NEGATIVE mg/dL   Hgb urine dipstick NEGATIVE NEGATIVE   Bilirubin Urine NEGATIVE NEGATIVE   Ketones, ur NEGATIVE NEGATIVE mg/dL   Protein, ur NEGATIVE NEGATIVE mg/dL   Nitrite NEGATIVE NEGATIVE   Leukocytes, UA SMALL (A) NEGATIVE   RBC / HPF 0-5 0 - 5 RBC/hpf   WBC, UA 6-30 0 - 5 WBC/hpf   Squamous Epithelial / LPF 6-30 (A) NONE SEEN   Mucus PRESENT    Ca Oxalate Crys, UA PRESENT   Pregnancy, urine POC     Status: None   Collection Time: 10/04/17  3:22 PM  Result Value Ref Range   Preg Test, Ur NEGATIVE NEGATIVE   ____________________________________________ ____________________________________________  RADIOLOGY  I personally reviewed all radiographic images ordered to evaluate for the above acute complaints and reviewed radiology reports and findings.  These findings were personally discussed with the patient.  Please see medical record for radiology report.  ____________________________________________   PROCEDURES  Procedure(s) performed:  Procedures    Critical Care performed: no ____________________________________________   INITIAL IMPRESSION / ASSESSMENT AND PLAN / ED COURSE  Pertinent labs & imaging results that were available during my care of the patient were reviewed by me and considered in my medical decision making (see chart for  details).  DDX: stone, cholelithasisi, cholecystitis, colitis, pyelo  Deborah Navarro is a 37 y.o. who presents to the ED with his as described above.  Blood work sent for the above differential is reassuring.  Based on her history and pain will order ultrasound to exclude cholelithiasis biliary colic or cholecystitis.  Possible kidney stone no evidence of urinary tract infection.  Will provide oral and IM pain medication.  Clinical Course as of Oct 04 1800  Mon Oct 04, 2017  1752 Right upper quadrant ultrasound shows no evidence of cholelithiasis or acute cholecystitis.  Symptoms most likely secondary to muscular skeletal pain given worsening pain with movement but patient does have calcium oxalate crystals in her urine so she may have component of ureterolithiasis.  Remainder abdominal exam soft and benign.  I do believe patient is stable and appropriate for outpatient follow-up.  Have discussed with the patient and available family all diagnostics and treatments performed thus far and all questions were answered to the best of my ability. The patient demonstrates understanding and agreement with plan.    [PR]    Clinical Course User Index [PR] Willy Eddy, MD     As part of my medical decision making, I reviewed the following data within the electronic MEDICAL RECORD NUMBER Nursing notes reviewed and incorporated, Labs reviewed, notes from prior ED visits and [place] Controlled Substance Database   ____________________________________________   FINAL CLINICAL IMPRESSION(S) / ED DIAGNOSES  Final diagnoses:  RUQ abdominal pain  Calcium oxalate crystals in urine      NEW MEDICATIONS STARTED DURING THIS VISIT:  New Prescriptions   No medications on file     Note:  This document was prepared using Dragon voice recognition software and may include unintentional dictation errors.    Willy Eddy, MD 10/04/17 [phone]

## 2017-10-04 NOTE — Discharge Instructions (Addendum)

## 2017-10-04 NOTE — ED Triage Notes (Signed)
C/O RUQ abdominal pain that radiates toward back since Thursday.  Patient states movement, bending over makes pain worse.

## 2017-10-12 ENCOUNTER — Other Ambulatory Visit: Payer: Self-pay | Admitting: Family Medicine

## 2017-10-12 ENCOUNTER — Telehealth: Payer: Self-pay | Admitting: Family Medicine

## 2017-10-12 ENCOUNTER — Ambulatory Visit (INDEPENDENT_AMBULATORY_CARE_PROVIDER_SITE_OTHER): Payer: BLUE CROSS/BLUE SHIELD | Admitting: Family Medicine

## 2017-10-12 ENCOUNTER — Encounter: Payer: Self-pay | Admitting: Family Medicine

## 2017-10-12 VITALS — BP 120/70 | HR 86 | Temp 98.9°F | Ht 63.5 in | Wt 158.2 lb

## 2017-10-12 DIAGNOSIS — R1032 Left lower quadrant pain: Secondary | ICD-10-CM | POA: Diagnosis not present

## 2017-10-12 DIAGNOSIS — R1084 Generalized abdominal pain: Secondary | ICD-10-CM | POA: Diagnosis not present

## 2017-10-12 DIAGNOSIS — R109 Unspecified abdominal pain: Secondary | ICD-10-CM | POA: Insufficient documentation

## 2017-10-12 NOTE — Patient Instructions (Signed)
Start miralax daily for more regular BM. Increase water and fiber. Please stop at the front desk to set up referral.

## 2017-10-12 NOTE — Progress Notes (Signed)
Subjective:    Patient ID: Deborah Navarro, female    DOB: 1980/09/04, 37 y.o.   MRN: [id number]  HPI   37 year old female presents for follow up ED visit for abdominal pain. 10/04/2017  Hx of chronic abdominal pain.. Never this severe.  6/10 on pain scale Pain not improving in last week.  Sever pain to back  bialterally.  Summary as follows: right upper quadrant abdominal pain radiating through to her back   (starting 2 weeks ago from 10/12/2017) Denies any fevers.  No nausea or vomiting.  Rates the pain is mild to moderate in severity.  Has had somewhat similar episodes in the past.  Has a history of endometriosis and has had extensive workup of her gallbladder.  No known family history of IBD.  No known history of kidney stones.  Denies any dysuria or diarrhea.   UA clear except calcium oxalate and squamous cells  Lipase neg  cbc  nml except hg 11.8, CMET nml  KUB negative.  Right upper quadrant ultrasound shows no evidence of cholelithiasis or acute cholecystitis.  Symptoms most likely secondary to muscular skeletal pain given worsening pain with movement but patient does have calcium oxalate crystals in her urine so she may have component of ureterolithiasis.   Today she reports  She has continued to have abdominal pain.. Now all over.  Pain with bending over. No change with eating. Lying flat helps with pain.  She has irregular menses. No recent sexual activity. Upreg negative. She does have chronic constipation.. One BM weekly. Does not take any medication.  BM this AM large but hard.  Hydrocodone helps with [pain.    Hx of chronic RUQ pain in 2011  colonoscpy 2011:  polyps  endocopy  HIDA  lapraoscopy Review of Systems  Constitutional: Negative for fatigue and fever.  HENT: Negative for congestion.   Eyes: Negative for pain.  Respiratory: Negative for cough and shortness of breath.   Cardiovascular: Negative for chest pain, palpitations and leg swelling.    Gastrointestinal: Positive for abdominal pain and constipation.  Genitourinary: Negative for dysuria and vaginal bleeding.  Musculoskeletal: Negative for back pain.  Neurological: Negative for syncope, light-headedness and headaches.  Psychiatric/Behavioral: Negative for dysphoric mood.       Objective:   Physical Exam  Constitutional: Vital signs are normal. She appears well-developed and well-nourished. She is cooperative.  Non-toxic appearance. She does not appear ill. No distress.  HENT:  Head: Normocephalic.  Right Ear: Hearing, tympanic membrane, external ear and ear canal normal. Tympanic membrane is not erythematous, not retracted and not bulging.  Left Ear: Hearing, tympanic membrane, external ear and ear canal normal. Tympanic membrane is not erythematous, not retracted and not bulging.  Nose: No mucosal edema or rhinorrhea. Right sinus exhibits no maxillary sinus tenderness and no frontal sinus tenderness. Left sinus exhibits no maxillary sinus tenderness and no frontal sinus tenderness.  Mouth/Throat: Uvula is midline, oropharynx is clear and moist and mucous membranes are normal.  Eyes: Pupils are equal, round, and reactive to light. Conjunctivae, EOM and lids are normal. Lids are everted and swept, no foreign bodies found.  Neck: Trachea normal and normal range of motion. Neck supple. Carotid bruit is not present. No thyroid mass and no thyromegaly present.  Cardiovascular: Normal rate, regular rhythm, S1 normal, S2 normal, normal heart sounds, intact distal pulses and normal pulses. Exam reveals no gallop and no friction rub.  No murmur heard. Pulmonary/Chest: Effort normal and breath sounds  normal. No tachypnea. No respiratory distress. She has no decreased breath sounds. She has no wheezes. She has no rhonchi. She has no rales.  Abdominal: Soft. Normal appearance and bowel sounds are normal. There is tenderness in the left upper quadrant and left lower quadrant. There is  guarding. There is no rigidity, no tenderness at McBurney's point and negative Murphy's sign.  Neurological: She is alert.  Skin: Skin is warm, dry and intact. No rash noted.  Psychiatric: Her speech is normal and behavior is normal. Judgment and thought content normal. Her mood appears not anxious. Cognition and memory are normal. She does not exhibit a depressed mood.          Assessment & Plan:

## 2017-10-12 NOTE — Assessment & Plan Note (Signed)
Acute on chronic .Marland Kitchen May be in part due to chronic constipation.. Start daily miralax, water and fiber.   given neg KUB, UA and UA.Marland Kitchen And severity of pain.. Now in LLQ.Marland Kitchen eval with CT abd with contraxt to rule out diverticulitis and atypical appendicitis symptoms.

## 2017-10-12 NOTE — Progress Notes (Signed)
She really needs to have CT abd dooner than Friday.. Can she do this tommorow? I have ordered the U preg.

## 2017-10-12 NOTE — Telephone Encounter (Signed)
I do not recommend hydrocodone given her constipation... Use ibuprofen 800 mg  OTC three times daily for pain... Also we need to know if abd pain getting worse. Hydrocodone could mask pain.   I have also requested to referral specialist to get pt in sooner for CT (should not wait until [id number]/[id number])... Can change to stat if needed.

## 2017-10-12 NOTE — Telephone Encounter (Signed)
Pt requesting refill on Hydrocodone. She forgot to ask during appt.

## 2017-10-13 ENCOUNTER — Ambulatory Visit
Admission: RE | Admit: 2017-10-13 | Discharge: 2017-10-13 | Disposition: A | Discharge status: Home / Self Care | Payer: BLUE CROSS/BLUE SHIELD | Source: Ambulatory Visit | Attending: Family Medicine | Admitting: Family Medicine

## 2017-10-13 ENCOUNTER — Other Ambulatory Visit (INDEPENDENT_AMBULATORY_CARE_PROVIDER_SITE_OTHER): Payer: BLUE CROSS/BLUE SHIELD

## 2017-10-13 DIAGNOSIS — K5900 Constipation, unspecified: Secondary | ICD-10-CM | POA: Insufficient documentation

## 2017-10-13 DIAGNOSIS — R1032 Left lower quadrant pain: Secondary | ICD-10-CM

## 2017-10-13 DIAGNOSIS — R109 Unspecified abdominal pain: Secondary | ICD-10-CM | POA: Diagnosis not present

## 2017-10-13 LAB — POCT URINE PREGNANCY: PREG TEST UR: NEGATIVE

## 2017-10-13 MED ORDER — IOPAMIDOL (ISOVUE-370) INJECTION 76%
75.0000 mL | Freq: Once | INTRAVENOUS | Status: AC | PRN
  Administered 2017-10-13: 75 mL via INTRAVENOUS

## 2017-10-13 NOTE — Telephone Encounter (Signed)
Deborah Navarro notified as instructed by telephone.

## 2017-10-13 NOTE — Telephone Encounter (Signed)
Patient is scheduled for her Ct today at [facility] at 1:00pm, call report requested to your cell number.

## 2017-10-15 ENCOUNTER — Ambulatory Visit: Admission: RE | Admit: 2017-10-15 | Payer: Self-pay | Source: Ambulatory Visit

## 2017-12-22 ENCOUNTER — Telehealth: Payer: Self-pay

## 2017-12-22 NOTE — Telephone Encounter (Signed)
-----   Message -----  From: Deborah RaringBrandi H Dungan  Sent: 12/22/2017 12:54 PM  To: [facility]  Subject: Appointment scheduled from MyChart          Appointment For: Deborah Navarro ([id number])  Visit Type: MYCHART OFFICE VISIT ([id number])    01/04/2018  9:15 AM 15 mins. Excell SeltzerBedsole, Amy E, MD    [facility]    Patient Comments:  Chest pains since 6/23.   I spoke with pt; pt had open heart surgery 23 yrs ago; on 12/05/17 pt woke up with mid chest pain that moved toward L shoulder but did not go down arm; pt was tired and when she took breath deep or normal pt had CP.Pt could not lay flat but rested and the next day she felt some better but the CP has not completely went away since 12/05/17.pt said on and off wakes up at night sweating; home is 67 degrees; not sure if related to CP. Pt has dizziness when bends over and stands up, no other time. No blurred vision or vision changes, no N&V. Pt said sometimes walking or exertion makes CP worse but can be sitting and have worse CP. Pt has been taking ibuprofen 800 mg q 4 - 5 h prn for pain. Pt has not seen card in over 3 years so would have to reestablish. Pt scheduled 30' appt to be seen 12/23/17 at 11:30. If pt condition worsens prior to appt pt will go to ED. Pt said she does not feel like needs to go to ED now; pain level now is 3. FYI to Dr Milinda Antisower.

## 2017-12-22 NOTE — Telephone Encounter (Signed)
I will see her then If symptoms worsen in the meantime do go to ED  Will cc to pcp so she is aware

## 2017-12-23 ENCOUNTER — Encounter: Payer: Self-pay | Admitting: Family Medicine

## 2017-12-23 ENCOUNTER — Ambulatory Visit (INDEPENDENT_AMBULATORY_CARE_PROVIDER_SITE_OTHER): Payer: BLUE CROSS/BLUE SHIELD | Admitting: Family Medicine

## 2017-12-23 ENCOUNTER — Ambulatory Visit (INDEPENDENT_AMBULATORY_CARE_PROVIDER_SITE_OTHER)
Admission: RE | Admit: 2017-12-23 | Discharge: 2017-12-23 | Disposition: A | Discharge status: Home / Self Care | Payer: BLUE CROSS/BLUE SHIELD | Source: Ambulatory Visit | Attending: Family Medicine | Admitting: Family Medicine

## 2017-12-23 VITALS — BP 130/76 | HR 73 | Temp 98.3°F | Ht 63.5 in | Wt 161.2 lb

## 2017-12-23 DIAGNOSIS — R079 Chest pain, unspecified: Secondary | ICD-10-CM | POA: Diagnosis not present

## 2017-12-23 DIAGNOSIS — R0789 Other chest pain: Secondary | ICD-10-CM | POA: Diagnosis not present

## 2017-12-23 DIAGNOSIS — R5382 Chronic fatigue, unspecified: Secondary | ICD-10-CM | POA: Diagnosis not present

## 2017-12-23 DIAGNOSIS — M791 Myalgia, unspecified site: Secondary | ICD-10-CM | POA: Diagnosis not present

## 2017-12-23 LAB — CBC WITH DIFFERENTIAL/PLATELET
BASOS PCT: 1 % (ref 0.0–3.0)
Basophils Absolute: 0.1 10*3/uL (ref 0.0–0.1)
EOS PCT: 0.6 % (ref 0.0–5.0)
Eosinophils Absolute: 0 10*3/uL (ref 0.0–0.7)
HCT: 37.3 % (ref 36.0–46.0)
Hemoglobin: 12.5 g/dL (ref 12.0–15.0)
LYMPHS ABS: 2.6 10*3/uL (ref 0.7–4.0)
Lymphocytes Relative: 44 % (ref 12.0–46.0)
MCHC: 33.6 g/dL (ref 30.0–36.0)
MCV: 88.2 fl (ref 78.0–100.0)
MONO ABS: 0.4 10*3/uL (ref 0.1–1.0)
Monocytes Relative: 6.4 % (ref 3.0–12.0)
NEUTROS ABS: 2.8 10*3/uL (ref 1.4–7.7)
Neutrophils Relative %: 48 % (ref 43.0–77.0)
PLATELETS: 288 10*3/uL (ref 150.0–400.0)
RBC: 4.23 Mil/uL (ref 3.87–5.11)
RDW: 12.5 % (ref 11.5–15.5)
WBC: 5.8 10*3/uL (ref 4.0–10.5)

## 2017-12-23 LAB — COMPREHENSIVE METABOLIC PANEL
ALBUMIN: 4.4 g/dL (ref 3.5–5.2)
ALT: 10 U/L (ref 0–35)
AST: 13 U/L (ref 0–37)
Alkaline Phosphatase: 56 U/L (ref 39–117)
BUN: 12 mg/dL (ref 6–23)
CALCIUM: 9.3 mg/dL (ref 8.4–10.5)
CO2: 29 mEq/L (ref 19–32)
CREATININE: 0.87 mg/dL (ref 0.40–1.20)
Chloride: 100 mEq/L (ref 96–112)
GFR: 77.08 mL/min (ref >60.00)
Glucose, Bld: 89 mg/dL (ref 70–99)
POTASSIUM: 4 meq/L (ref 3.5–5.1)
SODIUM: 136 meq/L (ref 135–145)
Total Bilirubin: 0.5 mg/dL (ref 0.2–1.2)
Total Protein: 7.7 g/dL (ref 6.0–8.3)

## 2017-12-23 LAB — VITAMIN D 25 HYDROXY (VIT D DEFICIENCY, FRACTURES): VITD: 16.99 ng/mL — AB (ref 30.00–100.00)

## 2017-12-23 LAB — VITAMIN B12: Vitamin B-12: 106 pg/mL — ABNORMAL LOW (ref 211–911)

## 2017-12-23 LAB — TSH: TSH: 1.53 u[IU]/mL (ref 0.35–4.50)

## 2017-12-23 LAB — SEDIMENTATION RATE: SED RATE: 11 mm/h (ref 0–20)

## 2017-12-23 NOTE — Patient Instructions (Addendum)
Labs today for fatigue and muscle pain  Also chest xray for the chest pain   We will refer you to cardiology for a follow up   Anti inflammatory for pain as needed (motrin/aleve)   Take care of yourself Make sure to drink enough fluids   Let's see how results look to make a plan

## 2017-12-23 NOTE — Assessment & Plan Note (Signed)
All over body pain -worse lately  Labs incl ESR/ B12 and Vit d in light of fatigue No acute joint changes /insect bites or rash  ? If poss myofasical pain syndrome  Will check her temp /watch for fever  Suspect chest pain is part of this

## 2017-12-23 NOTE — Progress Notes (Signed)
Subjective:    Patient ID: Deborah Navarro, female    DOB: May 13, 1981, 37 y.o.   MRN: [id number]  HPI  Here for symptom of chest pain   38 yo pt of Dr Diona Browner with hx of heart surgery as well as family hx of CAD  Wt Readings from Last 3 Encounters:  12/23/17 161 lb 4 oz (73.1 kg)  10/12/17 158 lb 4 oz (71.8 kg)  10/04/17 160 lb (72.6 kg)  28.12 kg/m   BP Readings from Last 3 Encounters:  12/23/17 130/76  10/12/17 120/70  10/04/17 137/83   Pulse Readings from Last 3 Encounters:  12/23/17 73  10/12/17 86  10/04/17 63   EKG today  Atrial rhythm Computer read as abn P axis  NSR rate 72  Got up the am of 6/23 -woke up with some chest discomfort (not unusual for her-gets inflammatory pain)  Felt worse and worse  Went on to work  [employer] to take a deep breath  By end of the night- ached all over /severe - could not bend over or lie down  Also exhausted  Did not take her temp  Took a left over hydrocodone -went to sleep   Chest pain is sub sternal and rad L and under breast  No change in chronic neck pain   No tick bites  No rashes No swollen joints  No urinary symptoms No cough or uri symptoms    Still having symptoms - just lingering  She aches all over  Especially in the chest  Some pain to take a deep breath   Works a Engineer, water and also cleans houses  Tends to have pain fairly often  Is on vacation next week    Has not had cardiology visit for a while  July 96 ASD repair- open heart surgery  Her cardiologist is Dr Johnsie Cancel     Lab Results  Component Value Date   CHOL 175 03/15/2015   HDL 51.80 03/15/2015   LDLCALC 108 (H) 03/15/2015   TRIG 73.0 03/15/2015   CHOLHDL 3 03/15/2015     Patient Active Problem List   Diagnosis Date Noted  . Generalized abdominal pain 10/12/2017  . Acute neck pain 03/27/2016  . Myalgia 03/27/2016  . Left-sided headache 03/27/2016  . Family history of early CAD 03/15/2015  . Chest pain 11/05/2014  .  COLONIC POLYPS 06/30/2010  . ANEMIA 06/30/2010  . Depression, major, in remission ([place]) 06/30/2010  . Hx of MENINGITIS 06/30/2010  . ENDOMETRIOSIS 06/30/2010  . CARDIAC MURMUR 06/30/2010  . PAP SMEAR, ABNORMAL 06/30/2010  . CHICKENPOX, HX OF 06/30/2010   Past Medical History:  Diagnosis Date  . Anemia, unspecified   . Blood transfusion, without reported diagnosis   . Depressive disorder, not elsewhere classified   . Elevated blood pressure reading without diagnosis of hypertension   . Endometriosis of fallopian tube   . Irritable bowel syndrome   . Streptococcal meningitis   . Undiagnosed cardiac murmurs   . Unspecified personal history presenting hazards to health    Past Surgical History:  Procedure Laterality Date  . DILATION AND CURETTAGE OF UTERUS    . LAPAROSCOPIC ENDOMETRIOSIS FULGURATION  10-1999   endometriosis  . NECK SURGERY N/A 06/14/15   due to C5/6 C6-7 disc herniation   . open heart surgery  12-1994   asd repair   Social History   Tobacco Use  . Smoking status: Never Smoker  . Smokeless tobacco: Never Used  Substance Use  Topics  . Alcohol use: Yes    Alcohol/week: 0.6 oz    Types: 1 Shots of liquor per week  . Drug use: No   Family History  Problem Relation Age of Onset  . Cancer Mother 2       non hodgkins lymphoma(mets to brain)  . Coronary artery disease Father   . Hypertension Father   . Hyperlipidemia Father   . Diabetes Father    No Known Allergies No current outpatient medications on file prior to visit.   No current facility-administered medications on file prior to visit.     Review of Systems  Constitutional: Positive for fatigue. Negative for activity change, appetite change, chills, fever and unexpected weight change.  HENT: Negative for congestion, ear pain, rhinorrhea, sinus pressure and sore throat.   Eyes: Negative for pain, redness and visual disturbance.  Respiratory: Negative for cough, shortness of breath and wheezing.     Cardiovascular: Positive for chest pain. Negative for palpitations and leg swelling.  Gastrointestinal: Negative for abdominal pain, blood in stool, constipation and diarrhea.       Abd bloating   Endocrine: Negative for polydipsia and polyuria.  Genitourinary: Positive for pelvic pain. Negative for decreased urine volume, dysuria, frequency, hematuria and urgency.       H/o endometriosis   Musculoskeletal: Positive for arthralgias, back pain and myalgias. Negative for neck stiffness.  Skin: Negative for pallor, rash and wound.  Allergic/Immunologic: Negative for environmental allergies.  Neurological: Negative for dizziness, syncope and headaches.  Hematological: Negative for adenopathy. Does not bruise/bleed easily.  Psychiatric/Behavioral: Negative for decreased concentration and dysphoric mood. The patient is not nervous/anxious.        Objective:   Physical Exam  Constitutional: She is oriented to person, place, and time. She appears well-developed and well-nourished. No distress.  Well appearing  Moves very slowly due to pain   HENT:  Head: Normocephalic and atraumatic.  Nose: Nose normal.  Mouth/Throat: Oropharynx is clear and moist.  Eyes: Pupils are equal, round, and reactive to light. Conjunctivae and EOM are normal. Right eye exhibits no discharge. Left eye exhibits no discharge.  Neck: Normal range of motion. Neck supple.  Cardiovascular: Normal rate and regular rhythm.  Pulmonary/Chest: Effort normal and breath sounds normal. No stridor. No respiratory distress. She has no wheezes. She has no rales. She exhibits tenderness.  Chest wall tenderness anterior and L lateral  Abdominal: Soft. Bowel sounds are normal. She exhibits no distension and no mass. There is no tenderness. There is no rebound and no guarding.  Musculoskeletal: Normal range of motion. She exhibits tenderness. She exhibits no edema or deformity.  Diffuse myofascial tenderness- back and limbs and neck  No  acute joint swelling or other changes   Lymphadenopathy:    She has no cervical adenopathy.  Neurological: She is alert and oriented to person, place, and time. She displays normal reflexes. No cranial nerve deficit. Coordination normal.  Skin: Skin is warm and dry. No rash noted. No erythema. No pallor.  Psychiatric: She has a normal mood and affect.  Pleasant           Assessment & Plan:   Problem List Items Addressed This Visit      Other   Chest pain - Primary    Atypical and with all over body pain  Some pleuritic component - cxr ordered  Not febrile  Labs today  Due for f/u with cardiology (h/o ASD repair and also strong fam hx of  early CAD)  Ref to cardiology Re assuring exam and EKG      Relevant Orders   EKG 12-Lead (Completed)   DG Chest 2 View (Completed)   Ambulatory referral to Cardiology   Myalgia    All over body pain -worse lately  Labs incl ESR/ B12 and Vit d in light of fatigue No acute joint changes /insect bites or rash  ? If poss myofasical pain syndrome  Will check her temp Liliane Shi for fever  Suspect chest pain is part of this        Relevant Orders   CBC with Differential/Platelet (Completed)   Comprehensive metabolic panel (Completed)   TSH (Completed)   VITAMIN D 25 Hydroxy (Vit-D Deficiency, Fractures) (Completed)   Vitamin B12 (Completed)   Sedimentation Rate (Completed)    Other Visit Diagnoses    Chronic fatigue       Relevant Orders   CBC with Differential/Platelet (Completed)   Comprehensive metabolic panel (Completed)   TSH (Completed)   VITAMIN D 25 Hydroxy (Vit-D Deficiency, Fractures) (Completed)   Vitamin B12 (Completed)   Sedimentation Rate (Completed)

## 2017-12-23 NOTE — Assessment & Plan Note (Addendum)
Atypical and with all over body pain  Some pleuritic component - cxr ordered  Not febrile  Labs today  Due for f/u with cardiology (h/o ASD repair and also strong fam hx of early CAD)  Ref to cardiology Re assuring exam and EKG

## 2017-12-24 ENCOUNTER — Telehealth: Payer: Self-pay | Admitting: *Deleted

## 2017-12-24 MED ORDER — VITAMIN D (ERGOCALCIFEROL) 1.25 MG (50000 UNIT) PO CAPS: 50000.0000 [IU] | ORAL_CAPSULE | ORAL | 0 refills | Status: DC

## 2017-12-24 NOTE — Telephone Encounter (Signed)
Rx sent 

## 2017-12-24 NOTE — Telephone Encounter (Signed)
-----   Message from Judy PimpleMarne A Tower, MD sent at 12/23/2017  7:15 PM EDT ----- Low D level  Please send in ergocalciferol 50,000 iu 1 po weekly for 12 weeks #12 no refills  Also start 2000 iu vit D3 otc   Vit B12 is also low  Please schedule 4 B12 shots weekly for 4 weeks Start otc B12 1000 mcg daily   F/u with pcp in about 5 weeks   Hope these will help fatigue  Will cc to pcp

## 2018-01-04 ENCOUNTER — Ambulatory Visit: Payer: BLUE CROSS/BLUE SHIELD | Admitting: Family Medicine

## 2018-01-04 ENCOUNTER — Ambulatory Visit (INDEPENDENT_AMBULATORY_CARE_PROVIDER_SITE_OTHER): Payer: BLUE CROSS/BLUE SHIELD

## 2018-01-04 DIAGNOSIS — E538 Deficiency of other specified B group vitamins: Secondary | ICD-10-CM | POA: Diagnosis not present

## 2018-01-04 MED ORDER — CYANOCOBALAMIN 1000 MCG/ML IJ SOLN
1000.0000 ug | Freq: Once | INTRAMUSCULAR | Status: AC
  Administered 2018-01-04: 1000 ug via INTRAMUSCULAR

## 2018-01-04 NOTE — Progress Notes (Signed)
Per orders of Dr. Tower, injection of Vit B 12 given by Rena Isley. Patient tolerated injection well. 

## 2018-01-11 NOTE — Progress Notes (Deleted)
Cardiology Office Note   Date:  01/11/2018   ID:  Deborah Navarro, DOB [birth year], MRN [id number]  PCP:  Jinny Sanders, MD  Cardiologist:   Jenkins Rouge, MD   No chief complaint on file.     History of Present Illness: Deborah Navarro is a 37 y.o. female who presents for consultation regarding chest pain. Referred by Dr Glori Bickers. Seen by Dr Rockey Situ for same in 2016 history of ASD repair in 96. SSCP in 2016 thought to be atypical and no testing done. Reviewed note from primary and pain atypical. Aches all over including chest Some pleuritic component and hurts to move CXR reviewed 12/23/17 NAD  Earlier in year had more abdominal pain with nausea and had benign [place] and CT of abdomen  Labs 12/23/17 normal including TSH and ESR B12 low and had injection Vit D low and given ergocalciferol for 12 weeks   ***    Past Medical History:  Diagnosis Date  . Anemia, unspecified   . Blood transfusion, without reported diagnosis   . Depressive disorder, not elsewhere classified   . Elevated blood pressure reading without diagnosis of hypertension   . Endometriosis of fallopian tube   . Irritable bowel syndrome   . Streptococcal meningitis   . Undiagnosed cardiac murmurs   . Unspecified personal history presenting hazards to health     Past Surgical History:  Procedure Laterality Date  . DILATION AND CURETTAGE OF UTERUS    . LAPAROSCOPIC ENDOMETRIOSIS FULGURATION  10-1999   endometriosis  . NECK SURGERY N/A 06/14/15   due to C5/6 C6-7 disc herniation   . open heart surgery  12-1994   asd repair     Current Outpatient Medications  Medication Sig Dispense Refill  . Vitamin D, Ergocalciferol, (DRISDOL) 50000 units CAPS capsule Take 1 capsule (50,000 Units total) by mouth every 7 (seven) days. For 12 weeks 12 capsule 0   No current facility-administered medications for this visit.     Allergies:   Patient has no known allergies.    Social History:  The patient  reports that  she has never smoked. She has never used smokeless tobacco. She reports that she drinks about 0.6 oz of alcohol per week. She reports that she does not use drugs.   Family History:  The patient's family history includes Cancer (age of onset: 1) in her mother; Coronary artery disease in her father; Diabetes in her father; Hyperlipidemia in her father; Hypertension in her father.    ROS:  Please see the history of present illness.   Otherwise, review of systems are positive for {NONE DEFAULTED:18576::"none"}.   All other systems are reviewed and negative.    PHYSICAL EXAM: VS:  There were no vitals taken for this visit. , BMI There is no height or weight on file to calculate BMI. Affect appropriate Healthy:  appears stated age 52: normal Neck supple with no adenopathy JVP normal no bruits no thyromegaly Lungs clear with no wheezing and good diaphragmatic motion Heart:  S1/S2 no murmur, no rub, gallop or click PMI normal Abdomen: benighn, BS positve, no tenderness, no AAA no bruit.  No HSM or HJR Distal pulses intact with no bruits No edema Neuro non-focal Skin warm and dry No muscular weakness    EKG:  12/23/17 SR low atrial focus rate 71 normal ST segments    Recent Labs: 12/23/2017: ALT 10; BUN 12; Creatinine, Ser 0.87; Hemoglobin 12.5; Platelets 288.0; Potassium 4.0; Sodium 136; TSH 1.53  Lipid Panel    Component Value Date/Time   CHOL 175 03/15/2015 1116   TRIG 73.0 03/15/2015 1116   HDL 51.80 03/15/2015 1116   CHOLHDL 3 03/15/2015 1116   VLDL 14.6 03/15/2015 1116   LDLCALC 108 (H) 03/15/2015 1116      Wt Readings from Last 3 Encounters:  12/23/17 161 lb 4 oz (73.1 kg)  10/12/17 158 lb 4 oz (71.8 kg)  10/04/17 160 lb (72.6 kg)      Other studies Reviewed: Additional studies/ records that were reviewed today include: Notes from Dr Rockey Situ 2016 ECG Notes Dr Glori Bickers primary CXR, CT, abdominal US .    ASSESSMENT AND PLAN:  1.  Chest Pain: *** 2. ASD Repair  *** 3.    Current medicines are reviewed at length with the patient today.  The patient {ACTIONS; HAS/DOES NOT HAVE:19233} concerns regarding medicines.  The following changes have been made:  {PLAN; NO CHANGE:13088:s}  Labs/ tests ordered today include: *** No orders of the defined types were placed in this encounter.    Disposition:   FU with ***     Signed, Jenkins Rouge, MD  01/11/2018 4:12 PM    [facility] [address], [place], [place]  [address] Phone: [phone]; Fax: [phone]

## 2018-01-13 ENCOUNTER — Ambulatory Visit (INDEPENDENT_AMBULATORY_CARE_PROVIDER_SITE_OTHER): Payer: BLUE CROSS/BLUE SHIELD | Admitting: *Deleted

## 2018-01-13 DIAGNOSIS — E538 Deficiency of other specified B group vitamins: Secondary | ICD-10-CM

## 2018-01-13 MED ORDER — CYANOCOBALAMIN 1000 MCG/ML IJ SOLN
1000.0000 ug | Freq: Once | INTRAMUSCULAR | Status: AC
  Administered 2018-01-13: 1000 ug via INTRAMUSCULAR

## 2018-01-13 NOTE — Progress Notes (Signed)
Per orders of Dr. Copland, injection of B12 given by [place], Shapale M. Patient tolerated injection well. 

## 2018-01-20 ENCOUNTER — Ambulatory Visit (INDEPENDENT_AMBULATORY_CARE_PROVIDER_SITE_OTHER): Payer: BLUE CROSS/BLUE SHIELD

## 2018-01-20 ENCOUNTER — Ambulatory Visit: Payer: Self-pay

## 2018-01-20 DIAGNOSIS — E538 Deficiency of other specified B group vitamins: Secondary | ICD-10-CM

## 2018-01-20 MED ORDER — CYANOCOBALAMIN 1000 MCG/ML IJ SOLN
1000.0000 ug | Freq: Once | INTRAMUSCULAR | Status: AC
  Administered 2018-01-20: 1000 ug via INTRAMUSCULAR

## 2018-01-20 NOTE — Progress Notes (Signed)
Pt given 1ml in Rt Deltoid. Tolerated well. This is #3 of 4 weekly injections ordered.

## 2018-01-21 ENCOUNTER — Ambulatory Visit: Payer: BLUE CROSS/BLUE SHIELD | Admitting: Cardiovascular Disease

## 2018-01-27 ENCOUNTER — Ambulatory Visit (INDEPENDENT_AMBULATORY_CARE_PROVIDER_SITE_OTHER): Payer: BLUE CROSS/BLUE SHIELD

## 2018-01-27 ENCOUNTER — Telehealth: Payer: Self-pay

## 2018-01-27 DIAGNOSIS — E538 Deficiency of other specified B group vitamins: Secondary | ICD-10-CM

## 2018-01-27 MED ORDER — CYANOCOBALAMIN 1000 MCG/ML IJ SOLN
1000.0000 ug | Freq: Once | INTRAMUSCULAR | Status: AC
  Administered 2018-01-27: 1000 ug via INTRAMUSCULAR

## 2018-01-27 NOTE — Progress Notes (Signed)
Per orders of Dr. Ermalene SearingBedsole, injection of Vitamin B12 given by Roena Maladyevontenno, Rilley Poulter Y in Left Deltoid Patient tolerated injection well.

## 2018-01-27 NOTE — Telephone Encounter (Signed)
Pt was seen today for her 4th B12 inj... Pt reports with the first 2 inj she had diarrhea lasting for about 12 hours abd with the last inj x 1 week ago she has a rash on neck and upper chest.... Pt states it does not itch or hurt and she has been taking Benadryl at bedtime.... Pt insisted on going ahead and getting 4th and final inj, I asked pt if she has ad and difficulty breathing, swallowing or swelling of airways, and pt said no. Pt states she was on the oral B12 and Vitamin D about 1 week prior to starting injection and did not have Sx.... Pt advised to continue Benadryl and to seek medical treatment if she develops swelling of throat or difficulty breathing

## 2018-01-28 NOTE — Telephone Encounter (Signed)
Noted  

## 2018-02-11 ENCOUNTER — Ambulatory Visit: Payer: BLUE CROSS/BLUE SHIELD | Admitting: Family Medicine

## 2018-03-23 ENCOUNTER — Encounter: Payer: Self-pay | Admitting: Family Medicine

## 2018-03-23 ENCOUNTER — Ambulatory Visit (INDEPENDENT_AMBULATORY_CARE_PROVIDER_SITE_OTHER): Payer: BLUE CROSS/BLUE SHIELD | Admitting: Family Medicine

## 2018-03-23 ENCOUNTER — Telehealth: Payer: Self-pay | Admitting: Obstetrics & Gynecology

## 2018-03-23 VITALS — BP 106/62 | HR 91 | Temp 98.3°F | Resp 16 | Ht 63.25 in | Wt 162.9 lb

## 2018-03-23 DIAGNOSIS — Z8742 Personal history of other diseases of the female genital tract: Secondary | ICD-10-CM

## 2018-03-23 DIAGNOSIS — Z13220 Encounter for screening for lipoid disorders: Secondary | ICD-10-CM

## 2018-03-23 DIAGNOSIS — E559 Vitamin D deficiency, unspecified: Secondary | ICD-10-CM | POA: Diagnosis not present

## 2018-03-23 DIAGNOSIS — M25542 Pain in joints of left hand: Secondary | ICD-10-CM | POA: Diagnosis not present

## 2018-03-23 DIAGNOSIS — Z862 Personal history of diseases of the blood and blood-forming organs and certain disorders involving the immune mechanism: Secondary | ICD-10-CM

## 2018-03-23 DIAGNOSIS — Z23 Encounter for immunization: Secondary | ICD-10-CM | POA: Diagnosis not present

## 2018-03-23 DIAGNOSIS — Z86010 Personal history of colonic polyps: Secondary | ICD-10-CM

## 2018-03-23 DIAGNOSIS — M25541 Pain in joints of right hand: Secondary | ICD-10-CM | POA: Diagnosis not present

## 2018-03-23 DIAGNOSIS — R21 Rash and other nonspecific skin eruption: Secondary | ICD-10-CM

## 2018-03-23 DIAGNOSIS — R109 Unspecified abdominal pain: Secondary | ICD-10-CM

## 2018-03-23 DIAGNOSIS — M791 Myalgia, unspecified site: Secondary | ICD-10-CM

## 2018-03-23 DIAGNOSIS — N809 Endometriosis, unspecified: Secondary | ICD-10-CM

## 2018-03-23 DIAGNOSIS — F325 Major depressive disorder, single episode, in full remission: Secondary | ICD-10-CM

## 2018-03-23 DIAGNOSIS — E538 Deficiency of other specified B group vitamins: Secondary | ICD-10-CM | POA: Diagnosis not present

## 2018-03-23 DIAGNOSIS — Z131 Encounter for screening for diabetes mellitus: Secondary | ICD-10-CM

## 2018-03-23 DIAGNOSIS — Z8619 Personal history of other infectious and parasitic diseases: Secondary | ICD-10-CM

## 2018-03-23 NOTE — Progress Notes (Signed)
Name: Deborah Navarro   MRN: [id number]    DOB: 06-Feb-1981   Date:03/23/2018       Progress Note  Subjective  Chief Complaint  Chief Complaint  Patient presents with  . Establish Care  . Immunizations    Flu shot    HPI  Arthralgia : she states she has history of recurrent myalgia, had one miscarriage in 2009, also has hand stiffness and intermittent joint swelling and erythema since 2010 , aching pain on both hands but worse in am and at the end of the day. She has also developed a malar rash over the past 2 years. She also developed and rash on the side of neck a few weeks ago . No family history of auto- immune disorder   Abdominal pain; intermittently, seen by GI in the past and had EGD and colonoscopy that showed polyps, she states she goes from episodes of constipation and diarrhea, no abdominal distension or blood in stools. She states pain does not always subsides after a bowel movements  Endometriosis: used to see gyn, but lost to follow up, also due for pap smear , previous history of abnormal pap smear and coloposcopy. She has irregular cycles, sexual activity is seldom with husband - he had back pain and she has pelvic pain  DDD cervical spine: history of neck surgery by Dr. Sheran Fava at [facility]. She still has intermittent numbness on left leg and occasionally on left hand. She has decrease rom of neck - decrease lateral bending. Pain on neck is high, almost nuchal.   Depression: she was treated for a short period of time with SSRI around 2005. Denies crying spells, sadness or lack of motivation, but feels tired all the time, explained PhQ9 is 7, she states just because she works too much. 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     Patient Active Problem List   Diagnosis Date Noted  . Recurrent abdominal pain 10/12/2017  . Chronic neck pain 03/27/2016  . Myalgia 03/27/2016  . Left-sided  headache 03/27/2016  . Family history of early CAD 03/15/2015  . History of colon polyps 06/30/2010  . History of anemia 06/30/2010  . Depression, major, in remission ([facility]) 06/30/2010  . Endometriosis 06/30/2010  . CARDIAC MURMUR 06/30/2010  . PAP SMEAR, ABNORMAL 06/30/2010  . History of chicken pox 06/30/2010    Past Surgical History:  Procedure Laterality Date  . DILATION AND CURETTAGE OF UTERUS    . LAPAROSCOPIC ENDOMETRIOSIS FULGURATION  10-1999   endometriosis  . NECK SURGERY N/A 06/14/15   due to C5/6 C6-7 disc herniation   . open heart surgery  12-1994   asd repair    Family History  Problem Relation Age of Onset  . Cancer Mother 8       non hodgkins lymphoma(mets to brain)  . Coronary artery disease Father   . Hypertension Father   . Hyperlipidemia Father   . Diabetes Father   . Diabetes Maternal Grandmother   . Heart attack Maternal Grandfather   . Other Maternal Grandfather        Deterioration of Jaw  . Stroke Paternal Grandmother   . Stroke Paternal Grandfather     Social History   Socioeconomic History  . Marital status: Married    Spouse name: Asher Muir  . Number of children: 1  . Years of education: Not on file  . Highest education level: Not on  file  Occupational History  . Occupation: Engineering geologist: [employer]  Social Needs  . Financial resource strain: Not hard at all  . Food insecurity:    Worry: Never true    Inability: Never true  . Transportation needs:    Medical: No    Non-medical: No  Tobacco Use  . Smoking status: Never Smoker  . Smokeless tobacco: Never Used  Substance and Sexual Activity  . Alcohol use: Not Currently    Alcohol/week: 1.0 standard drinks    Types: 1 Shots of liquor per week    Comment: very occasionally  . Drug use: No  . Sexual activity: Yes    Partners: Male  Lifestyle  . Physical activity:    Days per week: 0 days    Minutes per session: 0 min  . Stress: Not at all   Relationships  . Social connections:    Talks on phone: Three times a week    Gets together: Three times a week    Attends religious service: Never    Active member of club or organization: Yes    Attends meetings of clubs or organizations: More than 4 times per year    Relationship status: Married  . Intimate partner violence:    Fear of current or ex partner: No    Emotionally abused: No    Physically abused: No    Forced sexual activity: No  Other Topics Concern  . Not on file  Social History Narrative              Current Outpatient Medications:  Marland Kitchen  Vitamin D, Ergocalciferol, (DRISDOL) 50000 units CAPS capsule, Take 1 capsule (50,000 Units total) by mouth every 7 (seven) days. For 12 weeks (Patient not taking: Reported on 03/23/2018), Disp: 12 capsule, Rfl: 0  No Known Allergies  I personally reviewed active problem list, medication list, allergies, family history, social history with the patient/caregiver today.   ROS  Constitutional: Negative for fever or weight change.  Respiratory: Negative for cough and shortness of breath.   Cardiovascular: Negative for chest pain or palpitations.  Gastrointestinal: Negative for abdominal pain, no bowel changes.  Musculoskeletal: Negative for gait problem positive for intermittent  joint swelling.  Skin: positive  for rash.  Neurological: Negative for dizziness or headache.  No other specific complaints in a complete review of systems (except as listed in HPI above).  Objective  Vitals:   03/23/18 1125  BP: 106/62  Pulse: 91  Resp: 16  Temp: 98.3 F (36.8 C)  TempSrc: Oral  SpO2: 99%  Weight: 162 lb 14.4 oz (73.9 kg)  Height: 5' 3.25" (1.607 m)    Body mass index is 28.63 kg/m.  Physical Exam  Constitutional: Patient appears well-developed and well-nourished. Overweight.  No distress.  HEENT: head atraumatic, normocephalic, pupils equal and reactive to light,  neck supple, throat within normal  limits Cardiovascular: Normal rate, regular rhythm and normal heart sounds.  No murmur heard. No BLE edema. Pulmonary/Chest: Effort normal and breath sounds normal. No respiratory distress. Abdominal: Soft.  There is no tenderness. Skin: malar rash Psychiatric: Patient has a normal mood and affect. behavior is normal. Judgment and thought content normal. Muscular Skeletal: no synovitis today    PHQ2/9: Depression screen PHQ 2/9 03/23/2018  Decreased Interest 1  Down, Depressed, Hopeless 0  PHQ - 2 Score 1  Altered sleeping 2  Tired, decreased energy 3  Change in appetite 1  Feeling bad  or failure about yourself  0  Trouble concentrating 0  Moving slowly or fidgety/restless 0  Suicidal thoughts 0  PHQ-9 Score 7  Difficult doing work/chores Somewhat difficult     Fall Risk: Fall Risk  03/23/2018  Falls in the past year? No     Functional Status Survey: Is the patient deaf or have difficulty hearing?: No Does the patient have difficulty seeing, even when wearing glasses/contacts?: Yes Does the patient have difficulty concentrating, remembering, or making decisions?: No Does the patient have difficulty walking or climbing stairs?: No Does the patient have difficulty dressing or bathing?: No Does the patient have difficulty doing errands alone such as visiting a doctor's office or shopping?: No   Assessment & Plan  1. Depression, major, in remission ([facility])  Discussed duloxetine that may help with pain, but we will hold off until labs are back for review  2. Need for influenza vaccination  - Flu Vaccine QUAD 6+ mos PF IM (Fluarix Quad PF)  3. History of anemia  - CBC with Differential/Platelet - Iron, TIBC and Ferritin Panel; Future  4. Malar rash   5. B12 deficiency  - Vitamin B12 - CBC with Differential/Platelet  6. Endometriosis  - referral gyn   7. History of chicken pox   8. Myalgia  - ANA,IFA RA Diag Pnl w/rflx Tit/Patn - Sedimentation rate -  C-reactive protein  9. History of abnormal cervical Pap smear  -referral gyn  10. Arthralgia of both hands  - COMPLETE METABOLIC PANEL WITH GFR - ANA,IFA RA Diag Pnl w/rflx Tit/Patn - Sedimentation rate - C-reactive protein  11. Screening for diabetes mellitus  - Hemoglobin A1c  12. Vitamin D deficiency  - VITAMIN D 25 Hydroxy (Vit-D Deficiency, Fractures)  13. Lipid screening  - Lipid panel   14. History of colon polyps  - Ambulatory referral to Gastroenterology  15. Recurrent abdominal pain  - Ambulatory referral to Gastroenterology

## 2018-03-23 NOTE — Telephone Encounter (Signed)
[facility] referring for Endometriosis, History of abnormal cervical Pap smear. Pap smear on file 03/25/10, 03/15/15. Called and left voicemail for patient to call back to be schedule

## 2018-03-23 NOTE — Patient Instructions (Signed)
Irritable Bowel Syndrome, Adult Irritable bowel syndrome (IBS) is not one specific disease. It is a group of symptoms that affects the organs responsible for digestion (gastrointestinal or GI tract). To regulate how your GI tract works, your body sends signals back and forth between your intestines and your brain. If you have IBS, there may be a problem with these signals. As a result, your GI tract does not function normally. Your intestines may become more sensitive and overreact to certain things. This is especially true when you eat certain foods or when you are under stress. There are four types of IBS. These may be determined based on the consistency of your stool:  IBS with diarrhea.  IBS with constipation.  Mixed IBS.  Unsubtyped IBS.  It is important to know which type of IBS you have. Some treatments are more likely to be helpful for certain types of IBS. What are the causes? The exact cause of IBS is not known. What increases the risk? You may have a higher risk of IBS if:  You are a woman.  You are younger than 39 years old.  You have a family history of IBS.  You have mental health problems.  You have had bacterial infection of your GI tract.  What are the signs or symptoms? Symptoms of IBS vary from person to person. The main symptom is abdominal pain or discomfort. Additional symptoms usually include one or more of the following:  Diarrhea, constipation, or both.  Abdominal swelling or bloating.  Feeling full or sick after eating a small or regular-size meal.  Frequent gas.  Mucus in the stool.  A feeling of having more stool left after a bowel movement.  Symptoms tend to come and go. They may be associated with stress, psychiatric conditions, or nothing at all. How is this diagnosed? There is no specific test to diagnose IBS. Your health care provider will make a diagnosis based on a physical exam, medical history, and your symptoms. You may have other  tests to rule out other conditions that may be causing your symptoms. These may include:  Blood tests.  X-rays.  CT scan.  Endoscopy and colonoscopy. This is a test in which your GI tract is viewed with a long, thin, flexible tube.  How is this treated? There is no cure for IBS, but treatment can help relieve symptoms. IBS treatment often includes:  Changes to your diet, such as: ? Eating more fiber. ? Avoiding foods that cause symptoms. ? Drinking more water. ? Eating regular, medium-sized portioned meals.  Medicines. These may include: ? Fiber supplements if you have constipation. ? Medicine to control diarrhea (antidiarrheal medicines). ? Medicine to help control muscle spasms in your GI tract (antispasmodic medicines). ? Medicines to help with any mental health issues, such as antidepressants or tranquilizers.  Therapy. ? Talk therapy may help with anxiety, depression, or other mental health issues that can make IBS symptoms worse.  Stress reduction. ? Managing your stress can help keep symptoms under control.  Follow these instructions at home:  Take medicines only as directed by your health care provider.  Eat a healthy diet. ? Avoid foods and drinks with added sugar. ? Include more whole grains, fruits, and vegetables gradually into your diet. This may be especially helpful if you have IBS with constipation. ? Avoid any foods and drinks that make your symptoms worse. These may include dairy products and caffeinated or carbonated drinks. ? Do not eat large meals. ? Drink enough   fluid to keep your urine clear or pale yellow.  Exercise regularly. Ask your health care provider for recommendations of good activities for you.  Keep all follow-up visits as directed by your health care provider. This is important. Contact a health care provider if:  You have constant pain.  You have trouble or pain with swallowing.  You have worsening diarrhea. Get help right away  if:  You have severe and worsening abdominal pain.  You have diarrhea and: ? You have a rash, stiff neck, or severe headache. ? You are irritable, sleepy, or difficult to awaken. ? You are weak, dizzy, or extremely thirsty.  You have bright red blood in your stool or you have black tarry stools.  You have unusual abdominal swelling that is painful.  You vomit continuously.  You vomit blood (hematemesis).  You have both abdominal pain and a fever. This information is not intended to replace advice given to you by your health care provider. Make sure you discuss any questions you have with your health care provider. Document Released: 06/01/2005 Document Revised: 11/01/2015 Document Reviewed: 02/16/2014 Elsevier Interactive Patient Education  2018 [employer] Inc[employer]  

## 2018-03-25 ENCOUNTER — Other Ambulatory Visit: Payer: Self-pay | Admitting: Family Medicine

## 2018-03-25 DIAGNOSIS — R21 Rash and other nonspecific skin eruption: Secondary | ICD-10-CM

## 2018-03-25 DIAGNOSIS — M25541 Pain in joints of right hand: Secondary | ICD-10-CM

## 2018-03-25 DIAGNOSIS — M25542 Pain in joints of left hand: Secondary | ICD-10-CM

## 2018-03-25 DIAGNOSIS — R768 Other specified abnormal immunological findings in serum: Secondary | ICD-10-CM

## 2018-03-25 LAB — SEDIMENTATION RATE: Sed Rate: 14 mm/h (ref 0–20)

## 2018-03-25 LAB — COMPLETE METABOLIC PANEL WITH GFR
AG Ratio: 1.5 (calc) (ref 1.0–2.5)
ALBUMIN MSPROF: 4.5 g/dL (ref 3.6–5.1)
ALKALINE PHOSPHATASE (APISO): 57 U/L (ref 33–115)
ALT: 11 U/L (ref 6–29)
AST: 15 U/L (ref 10–30)
BUN: 10 mg/dL (ref 7–25)
CHLORIDE: 103 mmol/L (ref 98–110)
CO2: 25 mmol/L (ref 20–32)
Calcium: 9.1 mg/dL (ref 8.6–10.2)
Creat: 0.86 mg/dL (ref 0.50–1.10)
GFR, Est African American: 99 mL/min/{1.73_m2} (ref >=60)
GFR, Est Non African American: 85 mL/min/{1.73_m2} (ref >=60)
GLOBULIN: 3 g/dL (ref 1.9–3.7)
GLUCOSE: 89 mg/dL (ref 65–139)
POTASSIUM: 3.8 mmol/L (ref 3.5–5.3)
Sodium: 136 mmol/L (ref 135–146)
Total Bilirubin: 0.3 mg/dL (ref 0.2–1.2)
Total Protein: 7.5 g/dL (ref 6.1–8.1)

## 2018-03-25 LAB — ANA,IFA RA DIAG PNL W/RFLX TIT/PATN
ANA: POSITIVE — AB
Cyclic Citrullin Peptide Ab: <16 UNITS

## 2018-03-25 LAB — LIPID PANEL
CHOL/HDL RATIO: 3.6 (calc) (ref <5.0)
CHOLESTEROL: 175 mg/dL (ref <200)
HDL: 49 mg/dL — ABNORMAL LOW (ref >50)
LDL CHOLESTEROL (CALC): 109 mg/dL — AB
NON-HDL CHOLESTEROL (CALC): 126 mg/dL (ref <130)
Triglycerides: 76 mg/dL (ref <150)

## 2018-03-25 LAB — HEMOGLOBIN A1C
Hgb A1c MFr Bld: 5.4 % of total Hgb (ref <5.7)
Mean Plasma Glucose: 108 (calc)
eAG (mmol/L): 6 (calc)

## 2018-03-25 LAB — CBC WITH DIFFERENTIAL/PLATELET
BASOS ABS: 28 {cells}/uL (ref 0–200)
Basophils Relative: 0.5 %
EOS PCT: 0.9 %
Eosinophils Absolute: 50 cells/uL (ref 15–500)
HEMATOCRIT: 35.8 % (ref 35.0–45.0)
HEMOGLOBIN: 12.1 g/dL (ref 11.7–15.5)
LYMPHS ABS: 2475 {cells}/uL (ref 850–3900)
MCH: 29.5 pg (ref 27.0–33.0)
MCHC: 33.8 g/dL (ref 32.0–36.0)
MCV: 87.3 fL (ref 80.0–100.0)
MONOS PCT: 8.4 %
MPV: 10.8 fL (ref 7.5–12.5)
Neutro Abs: 2576 cells/uL (ref 1500–7800)
Neutrophils Relative %: 46 %
Platelets: 250 10*3/uL (ref 140–400)
RBC: 4.1 10*6/uL (ref 3.80–5.10)
RDW: 12.6 % (ref 11.0–15.0)
Total Lymphocyte: 44.2 %
WBC mixed population: 470 cells/uL (ref 200–950)
WBC: 5.6 10*3/uL (ref 3.8–10.8)

## 2018-03-25 LAB — C-REACTIVE PROTEIN: CRP: 0.4 mg/L (ref <8.0)

## 2018-03-25 LAB — ANTI-NUCLEAR AB-TITER (ANA TITER)

## 2018-03-25 LAB — VITAMIN D 25 HYDROXY (VIT D DEFICIENCY, FRACTURES): VIT D 25 HYDROXY: 25 ng/mL — AB (ref 30–100)

## 2018-03-25 LAB — VITAMIN B12: Vitamin B-12: 218 pg/mL (ref 200–1100)

## 2018-03-25 NOTE — Telephone Encounter (Signed)
Called and left voice mail for patient to call back to be schedule °

## 2018-03-25 NOTE — Progress Notes (Unsigned)
r 

## 2018-03-28 ENCOUNTER — Encounter: Payer: Self-pay | Admitting: Obstetrics and Gynecology

## 2018-03-28 ENCOUNTER — Ambulatory Visit (INDEPENDENT_AMBULATORY_CARE_PROVIDER_SITE_OTHER): Payer: BLUE CROSS/BLUE SHIELD | Admitting: Obstetrics and Gynecology

## 2018-03-28 ENCOUNTER — Other Ambulatory Visit (HOSPITAL_COMMUNITY)
Admission: RE | Admit: 2018-03-28 | Discharge: 2018-03-28 | Disposition: A | Discharge status: Home / Self Care | Payer: BLUE CROSS/BLUE SHIELD | Source: Ambulatory Visit | Attending: Obstetrics and Gynecology | Admitting: Obstetrics and Gynecology

## 2018-03-28 VITALS — BP 122/76 | Ht 63.0 in | Wt 164.0 lb

## 2018-03-28 DIAGNOSIS — Z01419 Encounter for gynecological examination (general) (routine) without abnormal findings: Secondary | ICD-10-CM | POA: Insufficient documentation

## 2018-03-28 DIAGNOSIS — Z1339 Encounter for screening examination for other mental health and behavioral disorders: Secondary | ICD-10-CM

## 2018-03-28 DIAGNOSIS — N809 Endometriosis, unspecified: Secondary | ICD-10-CM | POA: Diagnosis not present

## 2018-03-28 DIAGNOSIS — Z124 Encounter for screening for malignant neoplasm of cervix: Secondary | ICD-10-CM

## 2018-03-28 DIAGNOSIS — Z01411 Encounter for gynecological examination (general) (routine) with abnormal findings: Secondary | ICD-10-CM

## 2018-03-28 DIAGNOSIS — Z1331 Encounter for screening for depression: Secondary | ICD-10-CM

## 2018-03-28 NOTE — Progress Notes (Signed)
Gynecology Annual Exam  PCP: Alba Cory, MD  Chief Complaint  Patient presents with  . Referral  . Endometriosis  The patient is seen in referral at the request of Dr. Alba Cory, MD, for establishment of gynecologic care with a history of endometriosis.  History of Present Illness:  Ms. Deborah Navarro is a 37 y.o. G2P0011 who presents in referral from Dr. Alba Cory, of [facility] for establishment of gynecologic care with a history of endometriosis.  LMP was Patient's last menstrual period was 02/28/2018., presents today for her annual examination.  Her menses are irregular, coming every 4-5.5 weeks.  They last for a various amount of time, which varies from month to month.  The periods are usually heavy for 2-3 days followed by spotting.  Dysmenorrhea: present.  The severity varies a lot from from cycle to cycle.    She is single partner, contraception - none. "If it happens, it happens." Last Pap: 2016.  Result was negative with negative HPV.  dShe does have a history of abnormal pap smears. She has had colposcopy.  Hx of STDs: none  Last mammogram: n/a There is no FH of breast cancer. There is no FH of ovarian cancer. The patient does not do self-breast exams.  Tobacco use: The patient denies current or previous tobacco use. Alcohol use: social drinker Exercise: works two jobs and is on her feet all day long.   The patient wears seatbelts: yes.      History of endometriosis:  She has had a laparoscopic surgery in 2001.  She was told she had endometriosis and she has not taken medication for the pain in a long time.  She believes that the overall pattern of pain with her menses has gotten a little worse.  She also has some bowel issues with irritable bowel syndrome, which she is going to see a gastroenterologist to have evaluated again soon.  She is unsure whether her abdominal discomfort comes from her IBS or from endometriosis.  She does not wish  treatment at this time.  Past Medical History:  Diagnosis Date  . Anemia, unspecified   . Blood transfusion, without reported diagnosis   . Depressive disorder, not elsewhere classified   . Elevated blood pressure reading without diagnosis of hypertension   . Endometriosis of fallopian tube   . Irritable bowel syndrome   . Streptococcal meningitis   . Undiagnosed cardiac murmurs   . Unspecified personal history presenting hazards to health     Past Surgical History:  Procedure Laterality Date  . DILATION AND CURETTAGE OF UTERUS    . LAPAROSCOPIC ENDOMETRIOSIS FULGURATION  10-1999   endometriosis  . NECK SURGERY N/A 06/14/15   due to C5/6 C6-7 disc herniation   . open heart surgery  12-1994   asd repair    Prior to Admission medications: denies   Allergies: No Known Allergies  Gynecologic History: Patient's last menstrual period was 02/28/2018.  Obstetric History: G2P0111 G1 2002, SVD, 6 weeks early G2, 2009, SAB  Social History   Socioeconomic History  . Marital status: Married    Spouse name: Asher Muir  . Number of children: 1  . Years of education: Not on file  . Highest education level: Not on file  Occupational History  . Occupation: Engineering geologist: [employer]  Social Needs  . Financial resource strain: Not hard at all  . Food insecurity:    Worry: Never true    Inability:  Never true  . Transportation needs:    Medical: No    Non-medical: No  Tobacco Use  . Smoking status: Never Smoker  . Smokeless tobacco: Never Used  Substance and Sexual Activity  . Alcohol use: Not Currently    Alcohol/week: 1.0 standard drinks    Types: 1 Shots of liquor per week    Comment: very occasionally  . Drug use: No  . Sexual activity: Yes    Partners: Male  Lifestyle  . Physical activity:    Days per week: 0 days    Minutes per session: 0 min  . Stress: Not at all  Relationships  . Social connections:    Talks on phone: Three times a week     Gets together: Three times a week    Attends religious service: Never    Active member of club or organization: Yes    Attends meetings of clubs or organizations: More than 4 times per year    Relationship status: Married  . Intimate partner violence:    Fear of current or ex partner: No    Emotionally abused: No    Physically abused: No    Forced sexual activity: No  Other Topics Concern  . Not on file  Social History Narrative             Family History  Problem Relation Age of Onset  . Cancer Mother 69       non hodgkins lymphoma(mets to brain)  . Coronary artery disease Father   . Hypertension Father   . Hyperlipidemia Father   . Diabetes Father   . Diabetes Maternal Grandmother   . Heart attack Maternal Grandfather   . Other Maternal Grandfather        Deterioration of Jaw  . Stroke Paternal Grandmother   . Stroke Paternal Grandfather     Review of Systems  Constitutional: Negative.   HENT: Negative.   Eyes: Negative.   Respiratory: Negative.   Cardiovascular: Negative.   Gastrointestinal: Negative.   Genitourinary: Negative.   Musculoskeletal: Negative.   Skin: Negative.   Neurological: Negative.   Psychiatric/Behavioral: Negative.      Physical Exam BP 122/76   Ht 5\' 3"  (1.6 m)   Wt 164 lb (74.4 kg)   LMP 02/28/2018   BMI 29.05 kg/m    Physical Exam  Constitutional: She is oriented to person, place, and time. She appears well-developed and well-nourished. No distress.  Genitourinary: Uterus normal. Pelvic exam was performed with patient supine. There is no rash, tenderness, lesion or injury on the right labia. There is no rash, tenderness, lesion or injury on the left labia. No erythema, tenderness or bleeding in the vagina. No signs of injury around the vagina. No vaginal discharge found. Right adnexum does not display mass, does not display tenderness and does not display fullness. Left adnexum does not display mass, does not display tenderness and  does not display fullness. Cervix does not exhibit motion tenderness, lesion, discharge or polyp.   Uterus is mobile and anteverted. Uterus is not enlarged, tender or exhibiting a mass.  HENT:  Head: Normocephalic and atraumatic.  Eyes: EOM are normal. No scleral icterus.  Neck: Normal range of motion. Neck supple. No thyromegaly present.  Cardiovascular: Normal rate and regular rhythm. Exam reveals no gallop and no friction rub.  No murmur heard. Pulmonary/Chest: Effort normal and breath sounds normal. No respiratory distress. She has no wheezes. She has no rales. Right breast exhibits no inverted nipple,  no mass, no nipple discharge, no skin change and no tenderness. Left breast exhibits no inverted nipple, no mass, no nipple discharge, no skin change and no tenderness.  Abdominal: Soft. Bowel sounds are normal. She exhibits no distension and no mass. There is no tenderness. There is no rebound and no guarding.  Musculoskeletal: Normal range of motion. She exhibits no edema or tenderness.  Lymphadenopathy:    She has no cervical adenopathy.       Right: No inguinal adenopathy present.       Left: No inguinal adenopathy present.  Neurological: She is alert and oriented to person, place, and time. No cranial nerve deficit.  Skin: Skin is warm and dry. No rash noted. No erythema.  Psychiatric: She has a normal mood and affect. Her behavior is normal. Judgment normal.    Female chaperone present for pelvic and breast  portions of the physical exam  Results: AUDIT Questionnaire (screen for alcoholism): 1 PHQ-9: 10 (being treated for this by her PCP)   Assessment: 38 y.o. G69P0011 female here for routine annual gynecologic examination.  Plan: Problem List Items Addressed This Visit    None    Visit Diagnoses    Women's annual routine gynecological examination    -  Primary   Pap smear for cervical cancer screening       Screening for depression       Screening for alcoholism           Screening: -- Blood pressure screen normal -- Colonoscopy - not due -- Mammogram - not due -- Weight screening: overweight: continue to monitor -- Depression screening negative (PHQ-9) -- Nutrition: normal -- cholesterol screening: per PCP -- osteoporosis screening: not due -- tobacco screening: not using -- alcohol screening: AUDIT questionnaire indicates low-risk usage. -- family history of breast cancer screening: done. not at high risk. -- no evidence of domestic violence or intimate partner violence. -- STD screening: gonorrhea/chlamydia NAAT not collected per patient request. -- pap smear collected per ASCCP guidelines -- flu vaccine declines -- HPV vaccination series: not eligilbe  Endometriosis: Essentially stable for now.  The patient has had no treatment for what sounds like at least 10 years and is tolerating her symptoms well at this time.  Continue to monitor.  As she is not using anything for contraception, we discussed risk of pregnancy.  She appears okay with pregnancy.  Discussed optimization of health care just in case she becomes pregnant. Taking PNVs, no smoking, minimizing EtOH, and ensuring that she is otherwise healthy overall.   Thomasene Mohair, MD 03/28/2018 1:26 PM    CC: Alba Cory, MD [address] [place], [place] [address]

## 2018-03-29 ENCOUNTER — Encounter: Payer: Self-pay | Admitting: Family Medicine

## 2018-03-31 ENCOUNTER — Encounter: Payer: Self-pay | Admitting: Family Medicine

## 2018-04-01 LAB — CYTOLOGY - PAP
DIAGNOSIS: NEGATIVE
HPV 16/18/45 genotyping: NEGATIVE
HPV: DETECTED — AB

## 2018-04-07 DIAGNOSIS — R5382 Chronic fatigue, unspecified: Secondary | ICD-10-CM | POA: Diagnosis not present

## 2018-04-07 DIAGNOSIS — R21 Rash and other nonspecific skin eruption: Secondary | ICD-10-CM | POA: Diagnosis not present

## 2018-04-07 DIAGNOSIS — R768 Other specified abnormal immunological findings in serum: Secondary | ICD-10-CM | POA: Diagnosis not present

## 2018-04-28 DIAGNOSIS — M79671 Pain in right foot: Secondary | ICD-10-CM | POA: Insufficient documentation

## 2018-04-28 DIAGNOSIS — R768 Other specified abnormal immunological findings in serum: Secondary | ICD-10-CM | POA: Insufficient documentation

## 2018-04-28 DIAGNOSIS — R21 Rash and other nonspecific skin eruption: Secondary | ICD-10-CM | POA: Diagnosis not present

## 2018-04-28 DIAGNOSIS — M79672 Pain in left foot: Secondary | ICD-10-CM

## 2018-05-02 ENCOUNTER — Other Ambulatory Visit: Payer: Self-pay

## 2018-05-02 ENCOUNTER — Encounter: Payer: Self-pay | Admitting: Gastroenterology

## 2018-05-02 ENCOUNTER — Ambulatory Visit (INDEPENDENT_AMBULATORY_CARE_PROVIDER_SITE_OTHER): Payer: BLUE CROSS/BLUE SHIELD | Admitting: Gastroenterology

## 2018-05-02 VITALS — BP 118/65 | HR 80 | Ht 63.0 in | Wt 166.0 lb

## 2018-05-02 DIAGNOSIS — R1032 Left lower quadrant pain: Secondary | ICD-10-CM

## 2018-05-02 DIAGNOSIS — G8929 Other chronic pain: Secondary | ICD-10-CM

## 2018-05-02 DIAGNOSIS — R1031 Right lower quadrant pain: Secondary | ICD-10-CM | POA: Diagnosis not present

## 2018-05-02 DIAGNOSIS — Z860100 Personal history of colon polyps, unspecified: Secondary | ICD-10-CM

## 2018-05-02 DIAGNOSIS — K5900 Constipation, unspecified: Secondary | ICD-10-CM

## 2018-05-02 MED ORDER — NA SULFATE-K SULFATE-MG SULF 17.5-3.13-1.6 GM/177ML PO SOLN: 1.0000 | ORAL | 0 refills | Status: DC

## 2018-05-02 NOTE — Progress Notes (Signed)
Gastroenterology Consultation  Referring Provider:     Alba Cory, MD Primary Care Physician:  Alba Cory, MD Primary Gastroenterologist:  Dr. Servando Snare     Reason for Consultation:     Recurrent abdominal pain        HPI:   Deborah Navarro is a 37 y.o. y/o female referred for consultation & management of recurrent abdominal pain by Dr. Carlynn Purl, Danna Hefty, MD.  This patient comes in today with a history of abdominal pain.  The patient was evaluated for this abdominal pain with a colonoscopy and EGD back in 2011.  At that time the patient was found to have a adenomatous polyp in the colon but no source for her abdominal pain was seen.  The patient has been diagnosed with endometriosis and was recently seen by Dr. Jean Rosenthal her gynecologist.  The patient had her previous procedures done in [facility] by Dr. Loreta Ave.  At that time the patient had been reporting weight loss abdominal pain and heme positive stools.  The patient also carries a diagnosis of irritable bowel syndrome.    She does report that her abdominal pain is worse when she is under a lot of stress and when she has her menstrual cycle.  She also reports that she has back pain which is in the upper part of her back.  Her abdominal pain is suprapubic and very low in the abdominal area.  The patient also reports that she only moves her bowels once every week and sometimes once every 2 weeks.  She states that the bowel movements are not hard in texture but usually soft.  Past Medical History:  Diagnosis Date  . Anemia, unspecified   . Blood transfusion, without reported diagnosis   . Depressive disorder, not elsewhere classified   . Elevated blood pressure reading without diagnosis of hypertension   . Endometriosis of fallopian tube   . Irritable bowel syndrome   . Streptococcal meningitis   . Undiagnosed cardiac murmurs   . Unspecified personal history presenting hazards to health     Past Surgical History:  Procedure  Laterality Date  . DILATION AND CURETTAGE OF UTERUS    . LAPAROSCOPIC ENDOMETRIOSIS FULGURATION  10-1999   endometriosis  . NECK SURGERY N/A 06/14/15   due to C5/6 C6-7 disc herniation   . open heart surgery  12-1994   asd repair    Prior to Admission medications   Medication Sig Start Date End Date Taking? Authorizing Provider  Vitamin D, Ergocalciferol, (DRISDOL) 50000 units CAPS capsule Take 1 capsule (50,000 Units total) by mouth every 7 (seven) days. For 12 weeks Patient not taking: Reported on 03/23/2018 12/24/17   Judy Pimple, MD    Family History  Problem Relation Age of Onset  . Cancer Mother 32       non hodgkins lymphoma(mets to brain)  . Coronary artery disease Father   . Hypertension Father   . Hyperlipidemia Father   . Diabetes Father   . Diabetes Maternal Grandmother   . Heart attack Maternal Grandfather   . Other Maternal Grandfather        Deterioration of Jaw  . Stroke Paternal Grandmother   . Stroke Paternal Grandfather      Social History   Tobacco Use  . Smoking status: Never Smoker  . Smokeless tobacco: Never Used  Substance Use Topics  . Alcohol use: Not Currently    Alcohol/week: 1.0 standard drinks    Types: 1 Shots of liquor per week  Comment: very occasionally  . Drug use: No    Allergies as of 05/02/2018  . (No Known Allergies)    Review of Systems:    All systems reviewed and negative except where noted in HPI.   Physical Exam:  There were no vitals taken for this visit. No LMP recorded. General:   Alert,  Well-developed, well-nourished, pleasant and cooperative in NAD Head:  Normocephalic and atraumatic. Eyes:  Sclera clear, no icterus.   Conjunctiva pink. Ears:  Normal auditory acuity. Nose:  No deformity, discharge, or lesions. Mouth:  No deformity or lesions,oropharynx pink & moist. Neck:  Supple; no masses or thyromegaly. Lungs:  Respirations even and unlabored.  Clear throughout to auscultation.   No wheezes, crackles,  or rhonchi. No acute distress. Heart:  Regular rate and rhythm; no murmurs, clicks, rubs, or gallops. Abdomen:  Normal bowel sounds.  No bruits.  Soft, non-tender and non-distended without masses, hepatosplenomegaly or hernias noted.  No guarding or rebound tenderness.  Negative Carnett sign.   Rectal:  Deferred.  Msk:  Symmetrical without gross deformities.  Good, equal movement & strength bilaterally. Pulses:  Normal pulses noted. Extremities:  No clubbing or edema.  No cyanosis. Neurologic:  Alert and oriented x3;  grossly normal neurologically. Skin:  Intact without significant lesions or rashes.  No jaundice. Lymph Nodes:  No significant cervical adenopathy. Psych:  Alert and cooperative. Normal mood and affect.  Imaging Studies: No results found.  Assessment and Plan:   [name] is a 37 y.o. y/o female who comes in with a history of an adenomatous polyp and the patient will be set up for a colonoscopy due to her history of an adenomatous polyp.  Patient will also be started on Linzess to see if moving her bowels more frequently will decrease her abdominal pain.  If it does not then the patient may need to be started on dicyclomine.  The patient's symptoms most likely represent irritable bowel syndrome with the symptoms being worse with her menstrual cycle and with stress.   I have discussed risks & benefits which include, but are not limited to, bleeding, infection, perforation & drug reaction.  The patient agrees with this plan & written consent will be obtained.    Midge Miniumarren Britton Bera, MD. Clementeen GrahamFACG    Note: This dictation was prepared with Dragon dictation along with smaller phrase technology. Any transcriptional errors that result from this process are unintentional.

## 2018-05-04 ENCOUNTER — Ambulatory Visit (INDEPENDENT_AMBULATORY_CARE_PROVIDER_SITE_OTHER): Payer: BLUE CROSS/BLUE SHIELD | Admitting: Family Medicine

## 2018-05-04 ENCOUNTER — Encounter: Payer: Self-pay | Admitting: Family Medicine

## 2018-05-04 VITALS — BP 130/76 | HR 76 | Temp 98.6°F | Resp 16 | Ht 63.0 in | Wt 164.0 lb

## 2018-05-04 DIAGNOSIS — E559 Vitamin D deficiency, unspecified: Secondary | ICD-10-CM | POA: Diagnosis not present

## 2018-05-04 DIAGNOSIS — R768 Other specified abnormal immunological findings in serum: Secondary | ICD-10-CM

## 2018-05-04 DIAGNOSIS — R21 Rash and other nonspecific skin eruption: Secondary | ICD-10-CM

## 2018-05-04 DIAGNOSIS — E538 Deficiency of other specified B group vitamins: Secondary | ICD-10-CM | POA: Diagnosis not present

## 2018-05-04 DIAGNOSIS — R87810 Cervical high risk human papillomavirus (HPV) DNA test positive: Secondary | ICD-10-CM | POA: Insufficient documentation

## 2018-05-04 DIAGNOSIS — F330 Major depressive disorder, recurrent, mild: Secondary | ICD-10-CM

## 2018-05-04 MED ORDER — VITAMIN D 50 MCG (2000 UT) PO CAPS: 1.0000 | ORAL_CAPSULE | Freq: Every day | ORAL | 0 refills | Status: DC

## 2018-05-04 MED ORDER — METRONIDAZOLE 1 % EX GEL: Freq: Every day | CUTANEOUS | 0 refills | Status: DC

## 2018-05-04 MED ORDER — DULOXETINE HCL 30 MG PO CPEP: 30.0000 mg | ORAL_CAPSULE | Freq: Every day | ORAL | 0 refills | Status: DC

## 2018-05-04 MED ORDER — B-12 1000 MCG SL SUBL: 1.0000 | SUBLINGUAL_TABLET | Freq: Every day | SUBLINGUAL | 0 refills | Status: DC

## 2018-05-04 NOTE — Progress Notes (Signed)
Name: Deborah Navarro   MRN: [id number]    DOB: 1981-04-17   Date:05/04/2018       Progress Note  Subjective  Chief Complaint  Chief Complaint  Patient presents with  . Follow-up    6 week F/U  . Depression  . Constipation    Seen GI Monday-was prescribed Linzess. Is having a Colonoscopy December 2  . Endometriosis    Seen Dr. Oralia Rud came back slightly abnormal-HPV Positive Repeat pap in 1 year  . Abnormal Lab    Dr. Meda Coffee thinks ANA was false positive-having more blood work. Dr. Meda Coffee thinks patient needs to see Dermatologist for facial redness  . Foot Pain    Referral was made for patient to see Podiatrist from Dr. Meda Coffee    HPI  Facial rash/false positive ANA/foot pain: she had a positive ANA and seen by Dr. Meda Coffee who based on levels explained to patient it was a false positive value and she does not have an auto-immune disorder. She is still feeling very tired. Reviewed Dr. Francesca Oman note and she thinks rash on her face is secondary to rosacea and foot pain to be discussed with podiatrist, patients states she does not have time to see sub-specialists at this time, she is wiling to try medication for rosacea and call dermatologist if no improvement  Fatigue/elevated Phq 9 elevated and previous history of MDD, she also has a lot of anxiety. Discussed with patient the possibility of her depression causing the fatigue and not an auto-immune disorder, but also explained that duloxetine can be used for pain even in patients without the diagnosis of depression. She is willing to take medication to see if her symptoms will improve. She has tried an SSRI many years ago but not sure of the name.   Constipation: seen by GI and given Linzess , she only took two doses so far. She will have colonoscopy soon   History of endometriosis: seen by gyn, positive HPV but normal pap, she was given    Patient Active Problem List   Diagnosis Date Noted  . Recurrent abdominal pain 10/12/2017  .  Chronic neck pain 03/27/2016  . Myalgia 03/27/2016  . Left-sided headache 03/27/2016  . Family history of early CAD 03/15/2015  . History of colon polyps 06/30/2010  . History of anemia 06/30/2010  . Depression, major, in remission ([place]) 06/30/2010  . Endometriosis 06/30/2010  . CARDIAC MURMUR 06/30/2010  . PAP SMEAR, ABNORMAL 06/30/2010  . History of chicken pox 06/30/2010    Past Surgical History:  Procedure Laterality Date  . DILATION AND CURETTAGE OF UTERUS    . LAPAROSCOPIC ENDOMETRIOSIS FULGURATION  10-1999   endometriosis  . NECK SURGERY N/A 06/14/15   due to C5/6 C6-7 disc herniation   . open heart surgery  12-1994   asd repair    Family History  Problem Relation Age of Onset  . Cancer Mother 53       non hodgkins lymphoma(mets to brain)  . Coronary artery disease Father   . Hypertension Father   . Hyperlipidemia Father   . Diabetes Father   . Diabetes Maternal Grandmother   . Heart attack Maternal Grandfather   . Other Maternal Grandfather        Deterioration of Jaw  . Stroke Paternal Grandmother   . Stroke Paternal Grandfather     Social History   Socioeconomic History  . Marital status: Married    Spouse name: [name]  . Number of children: 1  .  Years of education: Not on file  . Highest education level: Not on file  Occupational History  . Occupation: Lobbyist: [employer]  . Financial resource strain: Not hard at all  . Food insecurity:    Worry: Never true    Inability: Never true  . Transportation needs:    Medical: No    Non-medical: No  Tobacco Use  . Smoking status: Never Smoker  . Smokeless tobacco: Never Used  Substance and Sexual Activity  . Alcohol use: Not Currently    Alcohol/week: 1.0 standard drinks    Types: 1 Shots of liquor per week    Comment: very occasionally  . Drug use: No  . Sexual activity: Yes    Partners: Male  Lifestyle  . Physical activity:    Days per week: 0 days     Minutes per session: 0 min  . Stress: Not at all  Relationships  . Social connections:    Talks on phone: Three times a week    Gets together: Three times a week    Attends religious service: Never    Active member of club or organization: Yes    Attends meetings of clubs or organizations: More than 4 times per year    Relationship status: Married  . Intimate partner violence:    Fear of current or ex partner: No    Emotionally abused: No    Physically abused: No    Forced sexual activity: No  Other Topics Concern  . Not on file  Social History Narrative              Current Outpatient Medications:  .  linaclotide (LINZESS) 145 MCG CAPS capsule, Take 145 mcg by mouth daily before breakfast., Disp: , Rfl:  .  Na Sulfate-K Sulfate-Mg Sulf (SUPREP BOWEL PREP KIT) 17.5-3.13-1.6 GM/177ML SOLN, Take 1 kit by mouth as directed., Disp: 1 Bottle, Rfl: 0  No Known Allergies  I personally reviewed active problem list, medication list, allergies, family history, social history with the patient/caregiver today.   ROS  Constitutional: Negative for fever or weight change.  Respiratory: Negative for cough and shortness of breath.   Cardiovascular: Negative for chest pain or palpitations.  Gastrointestinal: Negative for abdominal pain, no bowel changes.  Musculoskeletal: Negative for gait problem or joint swelling.  Skin: Negative for rash.  Neurological: Negative for dizziness or headache.  No other specific complaints in a complete review of systems (except as listed in HPI above).  Objective  Vitals:   05/04/18 1140  BP: 130/76  Pulse: 76  Resp: 16  Temp: 98.6 F (37 C)  TempSrc: Oral  SpO2: 99%  Weight: 164 lb (74.4 kg)  Height: 5' 3"  (1.6 m)    Body mass index is 29.05 kg/m.  Physical Exam  Constitutional: Patient appears well-developed and well-nourished. Overweight. No distress.  HEENT: head atraumatic, normocephalic, pupils equal and reactive to light, ,  neck supple, throat within normal limits Cardiovascular: Normal rate, regular rhythm and normal heart sounds.  No murmur heard. No BLE edema. Pulmonary/Chest: Effort normal and breath sounds normal. No respiratory distress. Abdominal: Soft.  There is no tenderness. Skin: malar rash, Dr. Meda Coffee suggested rosacea  Psychiatric: Patient has a normal mood and affect. behavior is normal. Judgment and thought content normal.   Recent Results (from the past 2160 hour(s))  Hemoglobin A1c     Status: None   Collection Time: 03/23/18 12:18 PM  Result Value Ref Range   Hgb A1c MFr Bld 5.4 <5.7 % of total Hgb    Comment: For the purpose of screening for the presence of diabetes: . <5.7%       Consistent with the absence of diabetes 5.7-6.4%    Consistent with increased risk for diabetes             (prediabetes) > or =6.5%  Consistent with diabetes . This assay result is consistent with a decreased risk of diabetes. . Currently, no consensus exists regarding use of hemoglobin A1c for diagnosis of diabetes in children. . According to American Diabetes Association (ADA) guidelines, hemoglobin A1c <7.0% represents optimal control in non-pregnant diabetic patients. Different metrics may apply to specific patient populations.  Standards of Medical Care in Diabetes(ADA). .    Mean Plasma Glucose 108 (calc)   eAG (mmol/L) 6.0 (calc)  Lipid panel     Status: Abnormal   Collection Time: 03/23/18 12:18 PM  Result Value Ref Range   Cholesterol 175 <200 mg/dL   HDL 49 (L) >50 mg/dL   Triglycerides 76 <150 mg/dL   LDL Cholesterol (Calc) 109 (H) mg/dL (calc)    Comment: Reference range: <100 . Desirable range <100 mg/dL for primary prevention;   <70 mg/dL for patients with CHD or diabetic patients  with > or = 2 CHD risk factors. Marland Kitchen LDL-C is now calculated using the Martin-Hopkins  calculation, which is a validated novel method providing  better accuracy than the Friedewald equation in the   estimation of LDL-C.  Cresenciano Genre et al. Annamaria Helling. 2202;542(70): 2061-2068  (http://education.[facility].com/faq/FAQ164)    Total CHOL/HDL Ratio 3.6 <5.0 (calc)   Non-HDL Cholesterol (Calc) 126 <130 mg/dL (calc)    Comment: For patients with diabetes plus 1 major ASCVD risk  factor, treating to a non-HDL-C goal of <100 mg/dL  (LDL-C of <70 mg/dL) is considered a therapeutic  option.   Vitamin B12     Status: None   Collection Time: 03/23/18 12:18 PM  Result Value Ref Range   Vitamin B-12 218 200 - 1,100 pg/mL    Comment: . Please Note: Although the reference range for vitamin B12 is [phone] pg/mL, it has been reported that between 5 and 10% of patients with values between 200 and 400 pg/mL may experience neuropsychiatric and hematologic abnormalities due to occult B12 deficiency; less than 1% of patients with values above 400 pg/mL will have symptoms. Marland Kitchen   VITAMIN D 25 Hydroxy (Vit-D Deficiency, Fractures)     Status: Abnormal   Collection Time: 03/23/18 12:18 PM  Result Value Ref Range   Vit D, 25-Hydroxy 25 (L) 30 - 100 ng/mL    Comment: Vitamin D Status         25-OH Vitamin D: . Deficiency:                    <20 ng/mL Insufficiency:             20 - 29 ng/mL Optimal:                 > or = 30 ng/mL . For 25-OH Vitamin D testing on patients on  D2-supplementation and patients for whom quantitation  of D2 and D3 fractions is required, the QuestAssureD(TM) 25-OH VIT D, (D2,D3), LC/MS/MS is recommended: order  code [id number] (patients >32yr). . For more information on this test, go to: http://education.[facility].com/faq/FAQ163 (This link is being provided for  informational/educational purposes only.)   COMPLETE METABOLIC PANEL  WITH GFR     Status: None   Collection Time: 03/23/18 12:18 PM  Result Value Ref Range   Glucose, Bld 89 65 - 139 mg/dL    Comment: .        Non-fasting reference interval .    BUN 10 7 - 25 mg/dL   Creat 0.86 0.50 - 1.10 mg/dL    GFR, Est Non African American 85 > OR = 60 mL/min/1.105m   GFR, Est African American 99 > OR = 60 mL/min/1.763m  BUN/Creatinine Ratio NOT APPLICABLE 6 - 22 (calc)   Sodium 136 135 - 146 mmol/L   Potassium 3.8 3.5 - 5.3 mmol/L   Chloride 103 98 - 110 mmol/L   CO2 25 20 - 32 mmol/L   Calcium 9.1 8.6 - 10.2 mg/dL   Total Protein 7.5 6.1 - 8.1 g/dL   Albumin 4.5 3.6 - 5.1 g/dL   Globulin 3.0 1.9 - 3.7 g/dL (calc)   AG Ratio 1.5 1.0 - 2.5 (calc)   Total Bilirubin 0.3 0.2 - 1.2 mg/dL   Alkaline phosphatase (APISO) 57 33 - 115 U/L   AST 15 10 - 30 U/L   ALT 11 6 - 29 U/L  CBC with Differential/Platelet     Status: None   Collection Time: 03/23/18 12:18 PM  Result Value Ref Range   WBC 5.6 3.8 - 10.8 Thousand/uL   RBC 4.10 3.80 - 5.10 Million/uL   Hemoglobin 12.1 11.7 - 15.5 g/dL   HCT 35.8 35.0 - 45.0 %   MCV 87.3 80.0 - 100.0 fL   MCH 29.5 27.0 - 33.0 pg   MCHC 33.8 32.0 - 36.0 g/dL   RDW 12.6 11.0 - 15.0 %   Platelets 250 140 - 400 Thousand/uL   MPV 10.8 7.5 - 12.5 fL   Neutro Abs 2,576 1,500 - 7,800 cells/uL   Lymphs Abs 2,475 850 - 3,900 cells/uL   WBC mixed population 470 200 - 950 cells/uL   Eosinophils Absolute 50 15 - 500 cells/uL   Basophils Absolute 28 0 - 200 cells/uL   Neutrophils Relative % 46 %   Total Lymphocyte 44.2 %   Monocytes Relative 8.4 %   Eosinophils Relative 0.9 %   Basophils Relative 0.5 %  ANA,IFA RA Diag Pnl w/rflx Tit/Patn     Status: Abnormal   Collection Time: 03/23/18 12:18 PM  Result Value Ref Range   Anti Nuclear Antibody(ANA) POSITIVE (A) NEGATIVE    Comment: ANA IFA is a first line screen for detecting the presence of up to approximately 150 autoantibodies in various autoimmune diseases. A positive ANA IFA result is suggestive of autoimmune disease and reflexes to titer and pattern. Further laboratory testing may be considered if clinically indicated. . For additional information, please refer  to http://education.[facility].com/faq/FAQ177 (This link is being provided for informational/ educational purposes only.) .    Rhuematoid fact SerPl-aCnc <1<561<97U/mL   Cyclic Citrullin Peptide Ab <16 UNITS    Comment: Reference Range Negative:            <20 Weak Positive:       20-39 Moderate Positive:   40-59 Strong Positive:     >59 .    INTERPRETATION      Comment: . A positive ANA, IFA indicates that one or more  antibodies associated with connective tissue disease could be positive. The RF and CCP assays are each  65-70% sensitive for established rheumatoid arthritis.  While it is still possible that this  patient has  rheumatoid arthritis, other connective tissue  diseases should be considered.  .   Sedimentation rate     Status: None   Collection Time: 03/23/18 12:18 PM  Result Value Ref Range   Sed Rate 14 0 - 20 mm/h  C-reactive protein     Status: None   Collection Time: 03/23/18 12:18 PM  Result Value Ref Range   CRP 0.4 <8.0 mg/L  Anti-nuclear ab-titer (ANA titer)     Status: Abnormal   Collection Time: 03/23/18 12:18 PM  Result Value Ref Range   ANA Titer 1 1:40 (H) titer    Comment: A low level ANA titer may be present in pre-clinical autoimmune diseases and normal individuals.                 Reference Range                 <1:40        Negative                 1:40-1:80    Low Antibody Level                 >1:80        Elevated Antibody Level .    ANA Pattern 1 Nuclear, Speckled (A)     Comment: Speckled pattern is associated with mixed connective tissue disease (MCTD), systemic lupus erythematosus (SLE), Sjogren's syndrome, dermatomyositis, and  systemic sclerosis/polymyositis overlap. . AC-2,4,5,29: Speckled . International Consensus on ANA Patterns (https://www.[email or web address]-[email or web address]/)   Cytology - PAP     Status: Abnormal   Collection Time: 03/28/18 12:00 AM  Result Value Ref Range   Adequacy      Satisfactory for evaluation   endocervical/transformation zone component PRESENT.   Diagnosis      NEGATIVE FOR INTRAEPITHELIAL LESIONS OR MALIGNANCY.   HPV DETECTED (A)     Comment: Normal Reference Range - NOT Detected   HPV 16/18/45 genotyping NEGATIVE for HPV 16 & 18/45     Comment: Normal Reference Range - Negative   Material Submitted CervicoVaginal Pap [ThinPrep Imaged]      PHQ2/9: Depression screen [facility] 2/9 05/04/2018 03/23/2018  Decreased Interest 1 1  Down, Depressed, Hopeless 1 0  PHQ - 2 Score 2 1  Altered sleeping 1 2  Tired, decreased energy 3 3  Change in appetite 0 1  Feeling bad or failure about yourself  1 0  Trouble concentrating 0 0  Moving slowly or fidgety/restless 0 0  Suicidal thoughts 0 0  PHQ-9 Score 7 7  Difficult doing work/chores Somewhat difficult Somewhat difficult    Fall Risk: Fall Risk  03/23/2018  Falls in the past year? No     Assessment & Plan  1. Mild episode of recurrent major depressive disorder (HCC)  - DULoxetine (CYMBALTA) 30 MG capsule; Take 1-2 capsules (30-60 mg total) by mouth daily. Start one cap first week after that 2 daily  Dispense: 60 capsule; Refill: 0  Discussed returning in one month for follow up but she prefers calling back in one month and return in 3 months for follow up  2. False positive ana  Seen by Dr. Meda Coffee   3. B12 deficiency  Resume B12 SL  4. Vitamin D deficiency  Resume Vitamin D daily   5. Facial rash  - metroNIDAZOLE (METROGEL) 1 % gel; Apply topically daily.  Dispense: 45 g; Refill: 0  6. Papanicolaou smear of cervix with positive high risk human papilloma  virus (HPV) test  Seen by gyn

## 2018-05-09 ENCOUNTER — Encounter: Payer: Self-pay | Admitting: *Deleted

## 2018-05-09 ENCOUNTER — Other Ambulatory Visit: Payer: Self-pay

## 2018-05-11 NOTE — Discharge Instructions (Signed)
General Anesthesia, Adult, Care After °These instructions provide you with information about caring for yourself after your procedure. Your health care provider may also give you more specific instructions. Your treatment has been planned according to current medical practices, but problems sometimes occur. Call your health care provider if you have any problems or questions after your procedure. °What can I expect after the procedure? °After the procedure, it is common to have: °· Vomiting. °· A sore throat. °· Mental slowness. ° °It is common to feel: °· Nauseous. °· Cold or shivery. °· Sleepy. °· Tired. °· Sore or achy, even in parts of your body where you did not have surgery. ° °Follow these instructions at home: °For at least 24 hours after the procedure: °· Do not: °? Participate in activities where you could fall or become injured. °? Drive. °? Use heavy machinery. °? Drink alcohol. °? Take sleeping pills or medicines that cause drowsiness. °? Make important decisions or sign legal documents. °? Take care of children on your own. °· Rest. °Eating and drinking °· If you vomit, drink water, juice, or soup when you can drink without vomiting. °· Drink enough fluid to keep your urine clear or pale yellow. °· Make sure you have little or no nausea before eating solid foods. °· Follow the diet recommended by your health care provider. °General instructions °· Have a responsible adult stay with you until you are awake and alert. °· Return to your normal activities as told by your health care provider. Ask your health care provider what activities are safe for you. °· Take over-the-counter and prescription medicines only as told by your health care provider. °· If you smoke, do not smoke without supervision. °· Keep all follow-up visits as told by your health care provider. This is important. °Contact a health care provider if: °· You continue to have nausea or vomiting at home, and medicines are not helpful. °· You  cannot drink fluids or start eating again. °· You cannot urinate after 8-12 hours. °· You develop a skin rash. °· You have fever. °· You have increasing redness at the site of your procedure. °Get help right away if: °· You have difficulty breathing. °· You have chest pain. °· You have unexpected bleeding. °· You feel that you are having a life-threatening or urgent problem. °This information is not intended to replace advice given to you by your health care provider. Make sure you discuss any questions you have with your health care provider. °Document Released: 09/07/2000 Document Revised: 11/04/2015 Document Reviewed: 05/16/2015 °Elsevier Interactive Patient Education © 2018 [employer]. ° °

## 2018-05-13 NOTE — Anesthesia Preprocedure Evaluation (Addendum)
Anesthesia Evaluation  Patient identified by MRN, date of birth, ID band Patient awake    Reviewed: Allergy & Precautions, NPO status , Patient's Chart, lab work & pertinent test results  History of Anesthesia Complications (+) PONV and history of anesthetic complications  Airway Mallampati: I  TM Distance: >3 FB Neck ROM: Full    Dental no notable dental hx.    Pulmonary neg pulmonary ROS,    Pulmonary exam normal breath sounds clear to auscultation       Cardiovascular Exercise Tolerance: Good Normal cardiovascular exam+ Valvular Problems/Murmurs (ASD s/p repair 1996)  Rhythm:Regular Rate:Normal     Neuro/Psych PSYCHIATRIC DISORDERS Anxiety Depression negative neurological ROS     GI/Hepatic IBS   Endo/Other  negative endocrine ROS  Renal/GU negative Renal ROS     Musculoskeletal   Abdominal   Peds  Hematology  (+) Blood dyscrasia, anemia ,   Anesthesia Other Findings   Reproductive/Obstetrics                           Anesthesia Physical Anesthesia Plan  ASA: II  Anesthesia Plan: General   Post-op Pain Management:    Induction: Intravenous  PONV Risk Score and Plan: 3 and Propofol infusion and TIVA  Airway Management Planned: Natural Airway  Additional Equipment:   Intra-op Plan:   Post-operative Plan:   Informed Consent: I have reviewed the patients History and Physical, chart, labs and discussed the procedure including the risks, benefits and alternatives for the proposed anesthesia with the patient or authorized representative who has indicated his/her understanding and acceptance.     Plan Discussed with: CRNA  Anesthesia Plan Comments:        Anesthesia Quick Evaluation

## 2018-05-16 ENCOUNTER — Ambulatory Visit
Admission: RE | Admit: 2018-05-16 | Discharge: 2018-05-16 | Disposition: A | Discharge status: Home / Self Care | Payer: BLUE CROSS/BLUE SHIELD | Source: Ambulatory Visit | Attending: Gastroenterology | Admitting: Gastroenterology

## 2018-05-16 ENCOUNTER — Encounter
Admission: RE | Disposition: A | Discharge status: Home / Self Care | Payer: Self-pay | Source: Ambulatory Visit | Attending: Gastroenterology

## 2018-05-16 ENCOUNTER — Ambulatory Visit: Payer: BLUE CROSS/BLUE SHIELD | Admitting: Anesthesiology

## 2018-05-16 DIAGNOSIS — Z86010 Personal history of colonic polyps: Secondary | ICD-10-CM | POA: Diagnosis not present

## 2018-05-16 DIAGNOSIS — Z1211 Encounter for screening for malignant neoplasm of colon: Secondary | ICD-10-CM | POA: Diagnosis not present

## 2018-05-16 DIAGNOSIS — F329 Major depressive disorder, single episode, unspecified: Secondary | ICD-10-CM | POA: Diagnosis not present

## 2018-05-16 DIAGNOSIS — F419 Anxiety disorder, unspecified: Secondary | ICD-10-CM | POA: Diagnosis not present

## 2018-05-16 HISTORY — DX: Other specified postprocedural states: R11.2

## 2018-05-16 HISTORY — DX: Atrial septal defect, unspecified: Q21.10

## 2018-05-16 HISTORY — PX: COLONOSCOPY WITH PROPOFOL: SHX5780

## 2018-05-16 HISTORY — DX: Family history of other specified conditions: Z84.89

## 2018-05-16 HISTORY — DX: Atrial septal defect: Q21.1

## 2018-05-16 HISTORY — DX: Presence of spectacles and contact lenses: Z97.3

## 2018-05-16 HISTORY — DX: Other specified postprocedural states: Z98.890

## 2018-05-16 SURGERY — COLONOSCOPY WITH PROPOFOL
Anesthesia: General

## 2018-05-16 MED ORDER — SODIUM CHLORIDE 0.9 % IV SOLN: INTRAVENOUS | Status: DC

## 2018-05-16 MED ORDER — LIDOCAINE HCL (CARDIAC) PF 100 MG/5ML IV SOSY
PREFILLED_SYRINGE | INTRAVENOUS | Status: DC | PRN
  Administered 2018-05-16: 30 mg via INTRAVENOUS

## 2018-05-16 MED ORDER — ONDANSETRON HCL 4 MG/2ML IJ SOLN
4.0000 mg | Freq: Once | INTRAMUSCULAR | Status: AC | PRN
  Administered 2018-05-16: 4 mg via INTRAVENOUS

## 2018-05-16 MED ORDER — ACETAMINOPHEN 325 MG PO TABS: 650.0000 mg | ORAL_TABLET | Freq: Once | ORAL | Status: DC | PRN

## 2018-05-16 MED ORDER — ACETAMINOPHEN 160 MG/5ML PO SOLN: 325.0000 mg | ORAL | Status: DC | PRN

## 2018-05-16 MED ORDER — LACTATED RINGERS IV SOLN
INTRAVENOUS | Status: DC
  Administered 2018-05-16: 07:00:00 via INTRAVENOUS

## 2018-05-16 MED ORDER — PROPOFOL 10 MG/ML IV BOLUS
INTRAVENOUS | Status: DC | PRN
  Administered 2018-05-16: 30 mg via INTRAVENOUS
  Administered 2018-05-16 (×2): 20 mg via INTRAVENOUS
  Administered 2018-05-16: 120 mg via INTRAVENOUS
  Administered 2018-05-16: 30 mg via INTRAVENOUS
  Administered 2018-05-16: 10 mg via INTRAVENOUS

## 2018-05-16 SURGICAL SUPPLY — 24 items
CANISTER SUCT 1200ML W/VALVE (MISCELLANEOUS) ×3 IMPLANT
CLIP HMST 235XBRD CATH ROT (MISCELLANEOUS) IMPLANT
CLIP RESOLUTION 360 11X235 (MISCELLANEOUS)
ELECT REM PT RETURN 9FT ADLT (ELECTROSURGICAL)
ELECTRODE REM PT RTRN 9FT ADLT (ELECTROSURGICAL) IMPLANT
FCP ESCP3.2XJMB 240X2.8X (MISCELLANEOUS)
FORCEPS BIOP RAD 4 LRG CAP 4 (CUTTING FORCEPS) IMPLANT
FORCEPS BIOP RJ4 240 W/NDL (MISCELLANEOUS)
FORCEPS ESCP3.2XJMB 240X2.8X (MISCELLANEOUS) IMPLANT
GOWN CVR UNV OPN BCK APRN NK (MISCELLANEOUS) ×2 IMPLANT
GOWN ISOL THUMB LOOP REG UNIV (MISCELLANEOUS) ×6
INJECTOR VARIJECT VIN23 (MISCELLANEOUS) IMPLANT
KIT DEFENDO VALVE AND CONN (KITS) IMPLANT
KIT ENDO PROCEDURE OLY (KITS) ×3 IMPLANT
MARKER SPOT ENDO TATTOO 5ML (MISCELLANEOUS) IMPLANT
PROBE APC STR FIRE (PROBE) IMPLANT
RETRIEVER NET ROTH 2.5X230 LF (MISCELLANEOUS) IMPLANT
SNARE SHORT THROW 13M SML OVAL (MISCELLANEOUS) IMPLANT
SNARE SHORT THROW 30M LRG OVAL (MISCELLANEOUS) IMPLANT
SNARE SNG USE RND 15MM (INSTRUMENTS) IMPLANT
SPOT EX ENDOSCOPIC TATTOO (MISCELLANEOUS)
TRAP ETRAP POLY (MISCELLANEOUS) IMPLANT
VARIJECT INJECTOR VIN23 (MISCELLANEOUS)
WATER STERILE IRR 250ML POUR (IV SOLUTION) ×3 IMPLANT

## 2018-05-16 NOTE — Anesthesia Procedure Notes (Signed)
Date/Time: 05/16/2018 8:01 AM Performed by: Maree KrabbeWarren, Caroll Cunnington, CRNA Pre-anesthesia Checklist: Patient identified, Emergency Drugs available, Suction available, Timeout performed and Patient being monitored Patient Re-evaluated:Patient Re-evaluated prior to induction Oxygen Delivery Method: Nasal cannula Placement Confirmation: positive ETCO2

## 2018-05-16 NOTE — Op Note (Signed)
[facility]astroenterology Patient Name: Dayton Sherr Procedure Date: 05/16/2018 7:43 AM MRN: [id number] Account #: [id number] Date of Birth: 1981-01-13 Admit Type: Outpatient Age: 37 Room: [facility] OR ROOM 01 Gender: Female Note Status: Finalized Procedure:            Colonoscopy Indications:          High risk colon cancer surveillance: Personal history                        of colonic polyps Providers:            Midge Minium MD, MD Referring MD:         Onnie Boer. Sowles, MD (Referring MD) Medicines:            Propofol per Anesthesia Complications:        No immediate complications. Procedure:            Pre-Anesthesia Assessment:                       - Prior to the procedure, a History and Physical was                        performed, and patient medications and allergies were                        reviewed. The patient's tolerance of previous                        anesthesia was also reviewed. The risks and benefits of                        the procedure and the sedation options and risks were                        discussed with the patient. All questions were                        answered, and informed consent was obtained. Prior                        Anticoagulants: The patient has taken no previous                        anticoagulant or antiplatelet agents. ASA Grade                        Assessment: II - A patient with mild systemic disease.                        After reviewing the risks and benefits, the patient was                        deemed in satisfactory condition to undergo the                        procedure.                       After obtaining informed consent, the colonoscope was  passed under direct vision. Throughout the procedure,                        the patient's blood pressure, pulse, and oxygen                        saturations were monitored continuously. The   Colonoscope was introduced through the anus and                        advanced to the the cecum, identified by appendiceal                        orifice and ileocecal valve. The colonoscopy was                        performed without difficulty. The patient tolerated the                        procedure well. The quality of the bowel preparation                        was excellent. Findings:      The perianal and digital rectal examinations were normal.      The colon (entire examined portion) appeared normal. Impression:           - The entire examined colon is normal.                       - No specimens collected. Recommendation:       - Discharge patient to home.                       - Resume previous diet.                       - Continue present medications.                       - Repeat colonoscopy in 5 years for surveillance. Procedure Code(s):    --- Professional ---                       [phone], Colonoscopy, flexible; diagnostic, including                        collection of specimen(s) by brushing or washing, when                        performed (separate procedure) Diagnosis Code(s):    --- Professional ---                       Z86.010, Personal history of colonic polyps CPT copyright 2018 American Medical Association. All rights reserved. The codes documented in this report are preliminary and upon coder review may  be revised to meet current compliance requirements. Midge Miniumarren Sydnee Lamour MD, MD 05/16/2018 8:20:38 AM This report has been signed electronically. Number of Addenda: 0 Note Initiated On: 05/16/2018 7:43 AM Scope Withdrawal Time: 0 hours 7 minutes 8 seconds  Total Procedure Duration: 0 hours 12 minutes 19 seconds       [facility]

## 2018-05-16 NOTE — H&P (Signed)
Deborah Lame, MD [address] [place], [place] [address] Phone:[phone] Fax : [phone]  Primary Care Physician:  [name], MD Primary Gastroenterologist:  [name]  Pre-Procedure History & Physical: HPI:  Deborah Navarro is a 37 y.o. female is here for an colonoscopy.   Past Medical History:  Diagnosis Date  . Anemia, unspecified   . ASD (atrial septal defect)    repaired 1996  . Blood transfusion, without reported diagnosis   . Depressive disorder, not elsewhere classified   . Elevated blood pressure reading without diagnosis of hypertension   . Endometriosis of fallopian tube   . Family history of adverse reaction to anesthesia    Father - PONV  . Irritable bowel syndrome   . PONV (postoperative nausea and vomiting)   . Streptococcal meningitis   . Undiagnosed cardiac murmurs   . Unspecified personal history presenting hazards to health   . Wears contact lenses     Past Surgical History:  Procedure Laterality Date  . DILATION AND CURETTAGE OF UTERUS    . LAPAROSCOPIC ENDOMETRIOSIS FULGURATION  10-1999   endometriosis  . NECK SURGERY N/A 06/14/15   due to C5/6 C6-7 disc herniation   . open heart surgery  12-1994   asd repair    Prior to Admission medications   Medication Sig Start Date End Date Taking? Authorizing Provider  linaclotide (LINZESS) 145 MCG CAPS capsule Take 145 mcg by mouth daily before breakfast.   Yes [provider]  metroNIDAZOLE (METROGEL) 1 % gel Apply topically daily. 05/04/18  Yes Sowles, Drue Stager, MD  Na Sulfate-K Sulfate-Mg Sulf (SUPREP BOWEL PREP KIT) 17.5-3.13-1.6 GM/177ML SOLN Take 1 kit by mouth as directed. 05/02/18  Yes Deborah Lame, MD  Cholecalciferol (VITAMIN D) 50 MCG (2000 UT) CAPS Take 1 capsule (2,000 Units total) by mouth daily. Patient not taking: Reported on 05/09/2018 05/04/18   [name], MD  Cyanocobalamin (B-12) 1000 MCG SUBL Place 1 tablet under the tongue daily. Patient  not taking: Reported on 05/09/2018 05/04/18   [name], MD  DULoxetine (CYMBALTA) 30 MG capsule Take 1-2 capsules (30-60 mg total) by mouth daily. Start one cap first week after that 2 daily Patient not taking: Reported on 05/09/2018 05/04/18   [name], MD    Allergies as of 05/02/2018  . (No Known Allergies)    Family History  Problem Relation Age of Onset  . Cancer Mother 74       non hodgkins lymphoma(mets to brain)  . Coronary artery disease Father   . Hypertension Father   . Hyperlipidemia Father   . Diabetes Father   . Diabetes Maternal Grandmother   . Heart attack Maternal Grandfather   . Other Maternal Grandfather        Deterioration of Jaw  . Stroke Paternal Grandmother   . Stroke Paternal Grandfather     Social History   Socioeconomic History  . Marital status: Married    Spouse name: [name]  . Number of children: 1  . Years of education: Not on file  . Highest education level: Not on file  Occupational History  . Occupation: Lobbyist: [employer]  . Financial resource strain: Not hard at all  . Food insecurity:    Worry: Never true    Inability: Never true  . Transportation needs:    Medical: No    Non-medical: No  Tobacco Use  . Smoking status: Never Smoker  .  Smokeless tobacco: Never Used  Substance and Sexual Activity  . Alcohol use: Not Currently    Alcohol/week: 1.0 standard drinks    Types: 1 Shots of liquor per week    Comment: very occasionally (couple x/yr)  . Drug use: No  . Sexual activity: Yes    Partners: Male  Lifestyle  . Physical activity:    Days per week: 0 days    Minutes per session: 0 min  . Stress: Not at all  Relationships  . Social connections:    Talks on phone: Three times a week    Gets together: Three times a week    Attends religious service: Never    Active member of club or organization: Yes    Attends meetings of clubs or organizations: More than 4  times per year    Relationship status: Married  . Intimate partner violence:    Fear of current or ex partner: No    Emotionally abused: No    Physically abused: No    Forced sexual activity: No  Other Topics Concern  . Not on file  Social History Narrative             Review of Systems: See HPI, otherwise negative ROS  Physical Exam: BP 140/74   Pulse 74   Temp 98.1 F (36.7 C) (Temporal)   Ht 5' 3"  (1.6 m)   Wt 72.6 kg   LMP 05/05/2018 (Exact Date) Comment: preg test negative  SpO2 100%   BMI 28.34 kg/m  General:   Alert,  pleasant and cooperative in NAD Head:  Normocephalic and atraumatic. Neck:  Supple; no masses or thyromegaly. Lungs:  Clear throughout to auscultation.    Heart:  Regular rate and rhythm. Abdomen:  Soft, nontender and nondistended. Normal bowel sounds, without guarding, and without rebound.   Neurologic:  Alert and  oriented x4;  grossly normal neurologically.  Impression/Plan: DANEE SOLLER is here for an colonoscopy to be performed for history of colon polyps  Risks, benefits, limitations, and alternatives regarding  colonoscopy have been reviewed with the patient.  Questions have been answered.  All parties agreeable.   Deborah Lame, MD  05/16/2018, 7:33 AM

## 2018-05-16 NOTE — Transfer of Care (Signed)
Immediate Anesthesia Transfer of Care Note  Patient: Deborah Navarro  Procedure(s) Performed: COLONOSCOPY WITH PROPOFOL (N/A )  Patient Location: PACU  Anesthesia Type: General  Level of Consciousness: awake, alert  and patient cooperative  Airway and Oxygen Therapy: Patient Spontanous Breathing and Patient connected to supplemental oxygen  Post-op Assessment: Post-op Vital signs reviewed, Patient's Cardiovascular Status Stable, Respiratory Function Stable, Patent Airway and No signs of Nausea or vomiting  Post-op Vital Signs: Reviewed and stable  Complications: No apparent anesthesia complications

## 2018-05-16 NOTE — Anesthesia Postprocedure Evaluation (Signed)
Anesthesia Post Note  Patient: Gwen PoundsBrandi H Brandvold  Procedure(s) Performed: COLONOSCOPY WITH PROPOFOL (N/A )  Patient location during evaluation: PACU Anesthesia Type: General Level of consciousness: awake and alert, oriented and patient cooperative Pain management: pain level controlled Vital Signs Assessment: post-procedure vital signs reviewed and stable Respiratory status: spontaneous breathing, nonlabored ventilation and respiratory function stable Cardiovascular status: blood pressure returned to baseline and stable Postop Assessment: adequate PO intake Anesthetic complications: no    Reed BreechAndrea Eliza Green

## 2018-05-17 ENCOUNTER — Encounter: Payer: Self-pay | Admitting: Gastroenterology

## 2018-05-26 DIAGNOSIS — G5762 Lesion of plantar nerve, left lower limb: Secondary | ICD-10-CM | POA: Diagnosis not present

## 2018-05-26 DIAGNOSIS — M79672 Pain in left foot: Secondary | ICD-10-CM | POA: Diagnosis not present

## 2018-06-21 DIAGNOSIS — G5762 Lesion of plantar nerve, left lower limb: Secondary | ICD-10-CM | POA: Diagnosis not present

## 2018-06-23 ENCOUNTER — Ambulatory Visit (INDEPENDENT_AMBULATORY_CARE_PROVIDER_SITE_OTHER): Payer: BLUE CROSS/BLUE SHIELD | Admitting: Family Medicine

## 2018-06-23 ENCOUNTER — Encounter: Payer: Self-pay | Admitting: Family Medicine

## 2018-06-23 VITALS — BP 116/76 | HR 93 | Temp 98.2°F | Resp 16 | Ht 63.0 in | Wt 160.9 lb

## 2018-06-23 DIAGNOSIS — F39 Unspecified mood [affective] disorder: Secondary | ICD-10-CM

## 2018-06-23 DIAGNOSIS — R5383 Other fatigue: Secondary | ICD-10-CM | POA: Diagnosis not present

## 2018-06-23 MED ORDER — CARIPRAZINE HCL 1.5 MG PO CAPS: 1.5000 mg | ORAL_CAPSULE | Freq: Every day | ORAL | 0 refills | Status: DC

## 2018-06-23 NOTE — Progress Notes (Signed)
Name: Deborah Navarro   MRN: [id number]    DOB: [birth year]   Date:06/23/2018       Progress Note  Subjective  Chief Complaint  Chief Complaint  Patient presents with  . Depression    6-8 week follow up    HPI  Fatigue/MDD: she came in today for 6 weeks follow up, she started duloxetine about 6 weeks ago and has noticed worsening of fatigue, lack of energy, phq 9 is much higher. She states she could nap during the day while at work. She has noticed decrease in irritability. She also has difficulty sleeping at night when she skips dose of Vitamin D , otherwise able to sleep 6-9hours and still feel tired all day. She filled out MDD questionnaire today and it was positive 10/13, positive for happening at the same time, but only considered a minor problem. Explained that she would benefit from seeing psychiatrist, or we can try Vraylar, she states she is willing to try medication before she goes to a psychiatrist . Having problems with manager and lost it with her about 5 weeks ago    Patient Active Problem List   Diagnosis Date Noted  . Papanicolaou smear of cervix with positive high risk human papilloma virus (HPV) test 05/04/2018  . False positive ana 04/28/2018  . Pain in both feet 04/28/2018  . Facial rash 04/07/2018  . Chronic fatigue 04/07/2018  . Recurrent abdominal pain 10/12/2017  . Chronic neck pain 03/27/2016  . Myalgia 03/27/2016  . Left-sided headache 03/27/2016  . Family history of early CAD 03/15/2015  . History of colon polyps 06/30/2010  . History of anemia 06/30/2010  . Depression, major, in remission ([place]) 06/30/2010  . Endometriosis 06/30/2010  . CARDIAC MURMUR 06/30/2010  . History of chicken pox 06/30/2010    Past Surgical History:  Procedure Laterality Date  . COLONOSCOPY WITH PROPOFOL N/A 05/16/2018   Procedure: COLONOSCOPY WITH PROPOFOL;  Surgeon: [name], MD;  Location: [facility];  Service: Endoscopy;  Laterality: N/A;  . DILATION AND  CURETTAGE OF UTERUS    . LAPAROSCOPIC ENDOMETRIOSIS FULGURATION  10-1999   endometriosis  . NECK SURGERY N/A 06/14/15   due to C5/6 C6-7 disc herniation   . open heart surgery  12-1994   asd repair    Family History  Problem Relation Age of Onset  . Cancer Mother 86       non hodgkins lymphoma(mets to brain)  . Coronary artery disease Father   . Hypertension Father   . Hyperlipidemia Father   . Diabetes Father   . Diabetes Maternal Grandmother   . Heart attack Maternal Grandfather   . Other Maternal Grandfather        Deterioration of Jaw  . Stroke Paternal Grandmother   . Stroke Paternal Grandfather     Social History   Socioeconomic History  . Marital status: Married    Spouse name: [name]  . Number of children: 1  . Years of education: Not on file  . Highest education level: Not on file  Occupational History  . Occupation: Lobbyist: [employer]  . Financial resource strain: Not hard at all  . Food insecurity:    Worry: Never true    Inability: Never true  . Transportation needs:    Medical: No    Non-medical: No  Tobacco Use  . Smoking status: Never Smoker  . Smokeless tobacco: Never Used  Substance and Sexual  Activity  . Alcohol use: Not Currently    Alcohol/week: 1.0 standard drinks    Types: 1 Shots of liquor per week    Comment: very occasionally (couple x/yr)  . Drug use: No  . Sexual activity: Yes    Partners: Male  Lifestyle  . Physical activity:    Days per week: 0 days    Minutes per session: 0 min  . Stress: Not at all  Relationships  . Social connections:    Talks on phone: Three times a week    Gets together: Three times a week    Attends religious service: Never    Active member of club or organization: Yes    Attends meetings of clubs or organizations: More than 4 times per year    Relationship status: Married  . Intimate partner violence:    Fear of current or ex partner: No    Emotionally  abused: No    Physically abused: No    Forced sexual activity: No  Other Topics Concern  . Not on file  Social History Narrative              Current Outpatient Medications:  .  Cholecalciferol (VITAMIN D) 50 MCG (2000 UT) CAPS, Take 1 capsule (2,000 Units total) by mouth daily., Disp: 30 capsule, Rfl: 0 .  Cyanocobalamin (B-12) 1000 MCG SUBL, Place 1 tablet under the tongue daily., Disp: 30 each, Rfl: 0 .  DULoxetine (CYMBALTA) 30 MG capsule, Take 1-2 capsules (30-60 mg total) by mouth daily. Start one cap first week after that 2 daily, Disp: 60 capsule, Rfl: 0 .  linaclotide (LINZESS) 145 MCG CAPS capsule, Take 145 mcg by mouth daily before breakfast., Disp: , Rfl:  .  metroNIDAZOLE (METROGEL) 1 % gel, Apply topically daily., Disp: 45 g, Rfl: 0 .  Na Sulfate-K Sulfate-Mg Sulf (SUPREP BOWEL PREP KIT) 17.5-3.13-1.6 GM/177ML SOLN, Take 1 kit by mouth as directed., Disp: 1 Bottle, Rfl: 0  No Known Allergies  I personally reviewed active problem list, medication list, allergies, family history, social history with the patient/caregiver today.   ROS  Constitutional: Negative for fever or significant weight change.  Respiratory: Negative for cough and shortness of breath.   Cardiovascular: Negative for chest pain but has occasional  palpitations.  Gastrointestinal: Negative for abdominal pain, improvement of constipation .  Musculoskeletal:Positive for gait problem but no  joint swelling.  Skin: Negative for rash.  Neurological: Negative for dizziness or headache.  No other specific complaints in a complete review of systems (except as listed in HPI above).  Objective  Vitals:   06/23/18 1046  BP: 116/76  Pulse: 93  Resp: 16  Temp: 98.2 F (36.8 C)  TempSrc: Oral  SpO2: 100%  Weight: 160 lb 14.4 oz (73 kg)  Height: 5' 3"  (1.6 m)    Body mass index is 28.5 kg/m.  Physical Exam  Constitutional: Patient appears well-developed and well-nourished. Overweight. No  distress.  HEENT: head atraumatic, normocephalic, pupils equal and reactive to light, red facial rash, neck supple, throat within normal limits Cardiovascular: Normal rate, regular rhythm and normal heart sounds.  No murmur heard. No BLE edema. Pulmonary/Chest: Effort normal and breath sounds normal. No respiratory distress. Abdominal: Soft.  There is no tenderness. Psychiatric: Patient has a normal mood and affect. behavior is normal. Judgment and thought content normal. Muscular Skeletal: trigger point negative    Recent Results (from the past 2160 hour(s))  Cytology - PAP     Status: Abnormal  Collection Time: 03/28/18 12:00 AM  Result Value Ref Range   Adequacy      Satisfactory for evaluation  endocervical/transformation zone component PRESENT.   Diagnosis      NEGATIVE FOR INTRAEPITHELIAL LESIONS OR MALIGNANCY.   HPV DETECTED (A)     Comment: Normal Reference Range - NOT Detected   HPV 16/18/45 genotyping NEGATIVE for HPV 16 & 18/45     Comment: Normal Reference Range - Negative   Material Submitted CervicoVaginal Pap [ThinPrep Imaged]       PHQ2/9: Depression screen [facility] 2/9 06/23/2018 05/04/2018 03/23/2018  Decreased Interest 3 1 1   Down, Depressed, Hopeless 2 1 0  PHQ - 2 Score 5 2 1   Altered sleeping 3 1 2   Tired, decreased energy 3 3 3   Change in appetite 3 0 1  Feeling bad or failure about yourself  2 1 0  Trouble concentrating 1 0 0  Moving slowly or fidgety/restless 1 0 0  Suicidal thoughts 0 0 0  PHQ-9 Score 18 7 7   Difficult doing work/chores Very difficult Somewhat difficult Somewhat difficult     Fall Risk: Fall Risk  06/23/2018 03/23/2018  Falls in the past year? 0 No  Number falls in past yr: 0 -  Injury with Fall? 0 -     Assessment & Plan  1. Other fatigue  Unknown cause, she has a history of EBV we will recheck level   2. Mood disorder ([place])  Wean self off duloxetine - cariprazine (VRAYLAR) capsule; Take 1 capsule (1.5 mg total) by mouth  daily.  Dispense: 30 capsule; Refill: 0

## 2018-06-24 ENCOUNTER — Encounter: Payer: Self-pay | Admitting: Family Medicine

## 2018-06-24 LAB — EPSTEIN-BARR VIRUS VCA ANTIBODY PANEL
EBV NA IgG: <18 U/mL
EBV VCA IgG: 700 U/mL — ABNORMAL HIGH

## 2018-06-27 ENCOUNTER — Other Ambulatory Visit: Payer: Self-pay | Admitting: Family Medicine

## 2018-06-27 ENCOUNTER — Telehealth: Payer: Self-pay

## 2018-06-27 DIAGNOSIS — F39 Unspecified mood [affective] disorder: Secondary | ICD-10-CM

## 2018-06-27 MED ORDER — ARIPIPRAZOLE 2 MG PO TABS: 2.0000 mg | ORAL_TABLET | Freq: Every evening | ORAL | 0 refills | Status: DC

## 2018-06-27 NOTE — Telephone Encounter (Signed)
Called patient to inform her that Leafy Kindle was not covered by her insurance. PCP will send Abilify to pharmacy in place of Vrylar.

## 2018-06-27 NOTE — Telephone Encounter (Signed)
Prior authorization was initiated and denied. Per her health plan's criteria, this drug is covered if you meet the following: If you have tried and cannot use one of the following:  Aripiprazole Olanzapine Quetiapine Risperidone Saphris

## 2018-07-13 ENCOUNTER — Encounter: Payer: Self-pay | Admitting: Family Medicine

## 2018-07-14 ENCOUNTER — Other Ambulatory Visit: Payer: Self-pay | Admitting: Family Medicine

## 2018-07-14 DIAGNOSIS — M791 Myalgia, unspecified site: Secondary | ICD-10-CM

## 2018-07-14 MED ORDER — PREGABALIN 75 MG PO CAPS: 75.0000 mg | ORAL_CAPSULE | Freq: Two times a day (BID) | ORAL | 0 refills | Status: DC

## 2018-07-14 NOTE — Progress Notes (Unsigned)
y

## 2018-07-18 ENCOUNTER — Other Ambulatory Visit: Payer: Self-pay

## 2018-07-18 DIAGNOSIS — F39 Unspecified mood [affective] disorder: Secondary | ICD-10-CM

## 2018-07-18 MED ORDER — CARIPRAZINE HCL 1.5 MG PO CAPS: 1.5000 mg | ORAL_CAPSULE | Freq: Every day | ORAL | 0 refills | Status: DC

## 2018-07-18 NOTE — Telephone Encounter (Signed)
Prior Authorization was initiated for Vraylar 1.5 mg and Approved. The medication is approved through 1 year through 07/15/2019. It was denied at first but approved on behalf of [employer].

## 2018-08-05 ENCOUNTER — Ambulatory Visit (INDEPENDENT_AMBULATORY_CARE_PROVIDER_SITE_OTHER): Payer: BLUE CROSS/BLUE SHIELD | Admitting: Family Medicine

## 2018-08-05 ENCOUNTER — Encounter: Payer: Self-pay | Admitting: Family Medicine

## 2018-08-05 VITALS — BP 110/80 | HR 78 | Temp 98.5°F | Resp 16 | Ht 63.0 in | Wt 170.1 lb

## 2018-08-05 DIAGNOSIS — F39 Unspecified mood [affective] disorder: Secondary | ICD-10-CM | POA: Insufficient documentation

## 2018-08-05 DIAGNOSIS — M797 Fibromyalgia: Secondary | ICD-10-CM

## 2018-08-05 DIAGNOSIS — M792 Neuralgia and neuritis, unspecified: Secondary | ICD-10-CM

## 2018-08-05 DIAGNOSIS — K5909 Other constipation: Secondary | ICD-10-CM

## 2018-08-05 DIAGNOSIS — L539 Erythematous condition, unspecified: Secondary | ICD-10-CM

## 2018-08-05 DIAGNOSIS — R768 Other specified abnormal immunological findings in serum: Secondary | ICD-10-CM

## 2018-08-05 MED ORDER — LINACLOTIDE 72 MCG PO CAPS: 72.0000 ug | ORAL_CAPSULE | Freq: Every day | ORAL | 0 refills | Status: DC

## 2018-08-05 MED ORDER — PREGABALIN 75 MG PO CAPS: 75.0000 mg | ORAL_CAPSULE | Freq: Two times a day (BID) | ORAL | 0 refills | Status: DC

## 2018-08-05 NOTE — Progress Notes (Signed)
Name: Deborah Navarro   MRN: [id number]    DOB: Jan 20, 1981   Date:08/05/2018       Progress Note  Subjective  Chief Complaint  Chief Complaint  Patient presents with  . Follow-up    6 week F/U  . Fatigue    Abilify was making her very tired, Arman Filter was not covered by [employer]  . MDD    Lyrica controls pain some times but not all days and has gained weight with it.  . Depression    Duloxentine-gave her insomnia and not controlling pain so Dr. Ancil Boozer stopped this one.     HPI  MDD:   Marital problems: she separated from husband in 2012 for two years but has been back since 2014. He works one full time job, and she is working 3 jobs and is tired. She pays all the bills, she is always the one stressed out. She is getting frustrated.   MDD: she was sleepy with Abilify, Vraylar not covered by insurance, could not tolerate Duloxetine. She states sleeping better with lyrica.   Neuropathic pain/FMS: she continues to have pain all over her body. She states Lyrica helped with her sleep but not sure about the pain, states changed position at work and moving less, but her feel stiff. We will try increase dose of lyrica and monitor. She continues to have burning pain on both feet and leg and feet /( left more than right) at the end of the day.   Chronic Constipation: seen by GI and given Linzess but 145 was too strong we will decrease to 72 mg   Patient Active Problem List   Diagnosis Date Noted  . Papanicolaou smear of cervix with positive high risk human papilloma virus (HPV) test 05/04/2018  . False positive ana 04/28/2018  . Pain in both feet 04/28/2018  . Facial rash 04/07/2018  . Chronic fatigue 04/07/2018  . Recurrent abdominal pain 10/12/2017  . Chronic neck pain 03/27/2016  . Myalgia 03/27/2016  . Left-sided headache 03/27/2016  . Family history of early CAD 03/15/2015  . History of colon polyps 06/30/2010  . History of anemia 06/30/2010  . Depression, major, in  remission ([place]) 06/30/2010  . Endometriosis 06/30/2010  . CARDIAC MURMUR 06/30/2010  . History of chicken pox 06/30/2010    Past Surgical History:  Procedure Laterality Date  . COLONOSCOPY WITH PROPOFOL N/A 05/16/2018   Procedure: COLONOSCOPY WITH PROPOFOL;  Surgeon: [name], MD;  Location: [facility];  Service: Endoscopy;  Laterality: N/A;  . DILATION AND CURETTAGE OF UTERUS    . LAPAROSCOPIC ENDOMETRIOSIS FULGURATION  10-1999   endometriosis  . NECK SURGERY N/A 06/14/15   due to C5/6 C6-7 disc herniation   . open heart surgery  12-1994   asd repair    Family History  Problem Relation Age of Onset  . Cancer Mother 54       non hodgkins lymphoma(mets to brain)  . Coronary artery disease Father   . Hypertension Father   . Hyperlipidemia Father   . Diabetes Father   . Diabetes Maternal Grandmother   . Heart attack Maternal Grandfather   . Other Maternal Grandfather        Deterioration of Jaw  . Stroke Paternal Grandmother   . Stroke Paternal Grandfather     Social History   Socioeconomic History  . Marital status: Married    Spouse name: [name]  . Number of children: 1  . Years of education: Not on file  .  Highest education level: Not on file  Occupational History  . Occupation: Lobbyist: [employer]  . Financial resource strain: Not hard at all  . Food insecurity:    Worry: Never true    Inability: Never true  . Transportation needs:    Medical: No    Non-medical: No  Tobacco Use  . Smoking status: Never Smoker  . Smokeless tobacco: Never Used  Substance and Sexual Activity  . Alcohol use: Not Currently    Alcohol/week: 1.0 standard drinks    Types: 1 Shots of liquor per week    Comment: very occasionally (couple x/yr)  . Drug use: No  . Sexual activity: Yes    Partners: Male  Lifestyle  . Physical activity:    Days per week: 0 days    Minutes per session: 0 min  . Stress: Not at all   Relationships  . Social connections:    Talks on phone: Three times a week    Gets together: Three times a week    Attends religious service: Never    Active member of club or organization: Yes    Attends meetings of clubs or organizations: More than 4 times per year    Relationship status: Married  . Intimate partner violence:    Fear of current or ex partner: No    Emotionally abused: No    Physically abused: No    Forced sexual activity: No  Other Topics Concern  . Not on file  Social History Narrative              Current Outpatient Medications:  .  Cholecalciferol (VITAMIN D) 50 MCG (2000 UT) CAPS, Take 1 capsule (2,000 Units total) by mouth daily., Disp: 30 capsule, Rfl: 0 .  Cyanocobalamin (B-12) 1000 MCG SUBL, Place 1 tablet under the tongue daily., Disp: 30 each, Rfl: 0 .  pregabalin (LYRICA) 75 MG capsule, Take 1 capsule (75 mg total) by mouth 2 (two) times daily., Disp: 60 capsule, Rfl: 0 .  ARIPiprazole (ABILIFY) 2 MG tablet, Take 1 tablet (2 mg total) by mouth every evening. In place of Vraylar because of cost (Patient not taking: Reported on 08/05/2018), Disp: 30 tablet, Rfl: 0 .  cariprazine (VRAYLAR) capsule, Take 1 capsule (1.5 mg total) by mouth daily. (Patient not taking: Reported on 08/05/2018), Disp: 30 capsule, Rfl: 0 .  linaclotide (LINZESS) 145 MCG CAPS capsule, Take 145 mcg by mouth daily before breakfast., Disp: , Rfl:  .  metroNIDAZOLE (METROGEL) 1 % gel, Apply topically daily. (Patient not taking: Reported on 08/05/2018), Disp: 45 g, Rfl: 0 .  Na Sulfate-K Sulfate-Mg Sulf (SUPREP BOWEL PREP KIT) 17.5-3.13-1.6 GM/177ML SOLN, Take 1 kit by mouth as directed. (Patient not taking: Reported on 08/05/2018), Disp: 1 Bottle, Rfl: 0  No Known Allergies  I personally reviewed active problem list, medication list, allergies, family history, social history with the patient/caregiver today.   ROS  Constitutional: Negative for fever or weight change.  Respiratory:  Negative for cough and shortness of breath.   Cardiovascular: Negative for chest pain or palpitations.  Gastrointestinal: Negative for abdominal pain, no bowel changes.  Musculoskeletal: Negative for gait problem or joint swelling.  Skin: Negative for rash.  Neurological: Negative for dizziness or headache.  No other specific complaints in a complete review of systems (except as listed in HPI above).  Objective  Vitals:   08/05/18 1459  BP: 110/80  Pulse: 78  Resp:  16  Temp: 98.5 F (36.9 C)  TempSrc: Oral  SpO2: 99%  Weight: 170 lb 1.6 oz (77.2 kg)  Height: _0  (1.6 m)    Body mass index is 30.13 kg/m.  Physical Exam  Constitutional: Patient appears well-developed and well-nourished. Obese  No distress.  HEENT: head atraumatic, normocephalic, pupils equal and reactive to light,  neck supple, throat within normal limits Cardiovascular: Normal rate, regular rhythm and normal heart sounds.  No murmur heard. No BLE edema. Pulmonary/Chest: Effort normal and breath sounds normal. No respiratory distress. Abdominal: Soft.  There is no tenderness. Muscular Skeletal: tender trigger points  Psychiatric: Patient has a normal mood and affect. behavior is normal. Judgment and thought content normal.  Recent Results (from the past 2160 hour(s))  Epstein-Barr virus VCA antibody panel     Status: Abnormal   Collection Time: 06/23/18 11:37 AM  Result Value Ref Range   EBV VCA IgM <36.00 U/mL    Comment:       U/mL              Interpretation       ----              --------------       <36.00            Negative       36.00-43.99       Equivocal       >43.99            Positive    EBV VCA IgG 700.00 (H) U/mL    Comment:        U/mL             Interpretation        ----             --------------        <18.00           Negative        18.00-21.99      Equivocal        >21.99           Positive    EBV NA IgG <18.00 U/mL    Comment:        U/mL             Interpretation         ----             --------------        <18.00           Negative        18.00-21.99      Equivocal        >21.99           Positive    Interpretation      Comment: . Suggestive of a recent Epstein-Barr virus infection. In infants, a similar pattern can occur as a result of passive maternal transfer of antibody. .       PHQ2/9: Depression screen [facility] 2/9 08/05/2018 06/23/2018 05/04/2018 03/23/2018  Decreased Interest _1 Down, Depressed, Hopeless _2 0  PHQ - 2 Score _3 Altered sleeping _4 Tired, decreased energy _5 Change in appetite 2 3 0 1  Feeling bad or failure about yourself  _6 0  Trouble concentrating 1 1 0 0  Moving slowly or fidgety/restless 1 1 0 0  Suicidal thoughts 0 0 0 0  PHQ-9 Score _0 Difficult doing work/chores Somewhat difficult Very difficult Somewhat difficult Somewhat difficult     Fall Risk: Fall Risk  08/05/2018 06/23/2018 03/23/2018  Falls in the past year? 0 0 No  Number falls in past yr: - 0 -  Injury with Fall? - 0 -     Functional Status Survey: Is the patient deaf or have difficulty hearing?: No Does the patient have difficulty seeing, even when wearing glasses/contacts?: Yes Does the patient have difficulty concentrating, remembering, or making decisions?: No Does the patient have difficulty walking or climbing stairs?: No Does the patient have difficulty dressing or bathing?: No Does the patient have difficulty doing errands alone such as visiting a doctor's office or shopping?: No   Assessment & Plan  1. Mood disorder ([place])   2. Chronic constipation  - linaclotide (LINZESS) 72 MCG capsule; Take 1 capsule (72 mcg total) by mouth daily before breakfast.  Dispense: 90 capsule; Refill: 0  3. Redness of skin  On feet  4. False positive ana   5. Fibromyalgia syndrome  - pregabalin (LYRICA) 75 MG capsule; Take 1-2 capsules (75-150 mg total) by mouth 2 (two) times daily.  Dispense: 90 capsule;  Refill: 0  6. Neuropathic pain  - pregabalin (LYRICA) 75 MG capsule; Take 1-2 capsules (75-150 mg total) by mouth 2 (two) times daily.  Dispense: 90 capsule; Refill: 0

## 2018-08-08 ENCOUNTER — Telehealth: Payer: Self-pay | Admitting: Family Medicine

## 2018-08-08 NOTE — Telephone Encounter (Signed)
Copied from CRM [id number]. Topic: General - Other >> Aug 08, 2018  4:15 PM Gean Birchwood R wrote: [facility] is calling in wanting to know is patient has had any failed attempts on poly glycol or lactulose

## 2018-08-08 NOTE — Telephone Encounter (Signed)
Unable to leave a voicemail. If patient calls back please ask what she has tried in the past for constipation.

## 2018-08-08 NOTE — Telephone Encounter (Signed)
Please ask patient, I think she has tried miralax in the past not lactulose

## 2018-08-10 MED ORDER — POLYETHYLENE GLYCOL 3350 17 G PO PACK: 17.0000 g | PACK | Freq: Every day | ORAL | 0 refills | Status: DC

## 2018-08-10 NOTE — Telephone Encounter (Signed)
Linzess Cap 72 mcg was denied by [facility]. PCP recommended her to try Miralax. Called patient to inform.

## 2018-09-08 ENCOUNTER — Other Ambulatory Visit: Payer: Self-pay

## 2018-09-08 ENCOUNTER — Encounter: Payer: Self-pay | Admitting: Family Medicine

## 2018-09-08 ENCOUNTER — Ambulatory Visit (INDEPENDENT_AMBULATORY_CARE_PROVIDER_SITE_OTHER): Payer: BLUE CROSS/BLUE SHIELD | Admitting: Family Medicine

## 2018-09-08 DIAGNOSIS — M792 Neuralgia and neuritis, unspecified: Secondary | ICD-10-CM | POA: Diagnosis not present

## 2018-09-08 DIAGNOSIS — F39 Unspecified mood [affective] disorder: Secondary | ICD-10-CM | POA: Diagnosis not present

## 2018-09-08 DIAGNOSIS — K5909 Other constipation: Secondary | ICD-10-CM

## 2018-09-08 DIAGNOSIS — E559 Vitamin D deficiency, unspecified: Secondary | ICD-10-CM

## 2018-09-08 DIAGNOSIS — E538 Deficiency of other specified B group vitamins: Secondary | ICD-10-CM | POA: Diagnosis not present

## 2018-09-08 DIAGNOSIS — M797 Fibromyalgia: Secondary | ICD-10-CM | POA: Diagnosis not present

## 2018-09-08 MED ORDER — PREGABALIN 150 MG PO CAPS: 150.0000 mg | ORAL_CAPSULE | Freq: Two times a day (BID) | ORAL | 2 refills | Status: DC

## 2018-09-08 MED ORDER — POLYETHYLENE GLYCOL 3350 17 G PO PACK: 17.0000 g | PACK | Freq: Every day | ORAL | 2 refills | Status: DC

## 2018-09-08 NOTE — Progress Notes (Signed)
Name: Deborah Navarro   MRN: [id number]    DOB: 06-10-1981   Date:09/08/2018       Progress Note  Subjective  Chief Complaint  Chief Complaint  Patient presents with  . Depression  . Mood Disorder    I connected with@ on 09/08/18 at 10:00 AM EDT by a video enabled telemedicine application and verified that I am speaking with the correct person using two identifiers.  I discussed the limitations of evaluation and management by telemedicine and the availability of in person appointments. The patient expressed understanding and agreed to proceed. Pt is at home and I am in office for phone call today which is performed due to COVID19 pandemic.  She is currently at work  I am at work   HPI  Marital problems: she separated from husband in 2012 for two years but has been back since 2014. He works one full time job, and she is working 3 jobs and is tired. She pays all the bills, she is always the one stressed out. However since pandemic, he has been working more days and it has helped a little   MDD: she was sleepy with Abilify, Vraylar not covered by insurance, could not tolerate Duloxetine. She states sleeping better with lyrica and has also noticed an improvement of her mood with medication. She states husband is working more hours and that has helped also. Phq 9 has improved down to 6 today   Neuropathic pain/FMS: she continues to have pain all over her body. She states Lyrica helped with her sleep but not sure about the pain, states changed position at work and moving less, but her feel stiff. She is currently taking 75 mg in am and 150 mg in the pm, pain is described as numbness, tingling, burning and sharp on hands and feet, she has a history of b12 deficiency and is taking SL supplementation, we will recheck level and may need injections. We will also go up on Lyrica to 150 mg BID   Chronic Constipation: seen by GI and given Linzess but too expensive, she is back on Miralax   Patient Active Problem List   Diagnosis Date Noted  . Mood disorder ([facility]) 08/05/2018  . Papanicolaou smear of cervix with positive high risk human papilloma virus (HPV) test 05/04/2018  . False positive ana 04/28/2018  . Pain in both feet 04/28/2018  . Facial rash 04/07/2018  . Chronic fatigue 04/07/2018  . Recurrent abdominal pain 10/12/2017  . Chronic neck pain 03/27/2016  . Myalgia 03/27/2016  . Left-sided headache 03/27/2016  . Family history of early CAD 03/15/2015  . History of colon polyps 06/30/2010  . History of anemia 06/30/2010  . Depression, major, in remission ([facility]) 06/30/2010  . Endometriosis 06/30/2010  . CARDIAC MURMUR 06/30/2010  . History of chicken pox 06/30/2010    Past Surgical History:  Procedure Laterality Date  . COLONOSCOPY WITH PROPOFOL N/A 05/16/2018   Procedure: COLONOSCOPY WITH PROPOFOL;  Surgeon: Midge Minium, MD;  Location: [facility];  Service: Endoscopy;  Laterality: N/A;  . DILATION AND CURETTAGE OF UTERUS    . LAPAROSCOPIC ENDOMETRIOSIS FULGURATION  10-1999   endometriosis  . NECK SURGERY N/A 06/14/15   due to C5/6 C6-7 disc herniation   . open heart surgery  12-1994   asd repair    Family History  Problem Relation Age of Onset  . Cancer Mother 45       non hodgkins lymphoma(mets to brain)  . Coronary artery  disease Father   . Hypertension Father   . Hyperlipidemia Father   . Diabetes Father   . Diabetes Maternal Grandmother   . Heart attack Maternal Grandfather   . Other Maternal Grandfather        Deterioration of Jaw  . Stroke Paternal Grandmother   . Stroke Paternal Grandfather     Social History   Socioeconomic History  . Marital status: Married    Spouse name: Asher Muir  . Number of children: 1  . Years of education: Not on file  . Highest education level: Not on file  Occupational History  . Occupation: Engineering geologist: [employer]  Social Needs  . Financial resource strain: Not hard at  all  . Food insecurity:    Worry: Never true    Inability: Never true  . Transportation needs:    Medical: No    Non-medical: No  Tobacco Use  . Smoking status: Never Smoker  . Smokeless tobacco: Never Used  Substance and Sexual Activity  . Alcohol use: Not Currently    Alcohol/week: 1.0 standard drinks    Types: 1 Shots of liquor per week    Comment: very occasionally (couple x/yr)  . Drug use: No  . Sexual activity: Yes    Partners: Male  Lifestyle  . Physical activity:    Days per week: 0 days    Minutes per session: 0 min  . Stress: Not at all  Relationships  . Social connections:    Talks on phone: Three times a week    Gets together: Three times a week    Attends religious service: Never    Active member of club or organization: Yes    Attends meetings of clubs or organizations: More than 4 times per year    Relationship status: Married  . Intimate partner violence:    Fear of current or ex partner: No    Emotionally abused: No    Physically abused: No    Forced sexual activity: No  Other Topics Concern  . Not on file  Social History Narrative              Current Outpatient Medications:  .  Cholecalciferol (VITAMIN D) 50 MCG (2000 UT) CAPS, Take 1 capsule (2,000 Units total) by mouth daily., Disp: 30 capsule, Rfl: 0 .  Cyanocobalamin (B-12) 1000 MCG SUBL, Place 1 tablet under the tongue daily., Disp: 30 each, Rfl: 0 .  pregabalin (LYRICA) 75 MG capsule, Take 1-2 capsules (75-150 mg total) by mouth 2 (two) times daily., Disp: 90 capsule, Rfl: 0 .  linaclotide (LINZESS) 72 MCG capsule, Take 1 capsule (72 mcg total) by mouth daily before breakfast. (Patient not taking: Reported on 09/08/2018), Disp: 90 capsule, Rfl: 0 .  polyethylene glycol (MIRALAX / GLYCOLAX) packet, Take 17 g by mouth daily. (Patient not taking: Reported on 09/08/2018), Disp: 30 each, Rfl: 0  No Known Allergies  I personally reviewed active problem list, medication list, allergies, family  history, social history with the patient/caregiver today.   ROS  Constitutional: Negative for fever or weight change.  Respiratory: Negative for cough and shortness of breath.   Cardiovascular: Negative for chest pain or palpitations.  Gastrointestinal: Negative for abdominal pain, no bowel changes.  Musculoskeletal: Negative for gait problem or joint swelling.  Skin: Negative for rash.  Neurological: Negative for dizziness or headache.  No other specific complaints in a complete review of systems (except as listed in HPI above).  Objective  Virtual encounter, vitals not obtained.  Physical Exam  Awake, alert, oriented, normal speech pattern, no distress    PHQ[facility]9: [facility] 09/08/2018 08/05/2018 06/23/2018 05/04/2018 03/23/2018  Decreased Interest 0 1 3 1 1   Down, Depressed, Hopeless 0 1 2 1  0  PHQ - 2 Score 0 2 5 2 1   Altered sleeping 1 1 3 1 2   Tired, decreased energy 3 1 3 3 3   Change in appetite 1 2 3  0 1  Feeling bad or failure about yourself  0 1 2 1  0  Trouble concentrating 1 1 1  0 0  Moving slowly or fidgety/restless 0 1 1 0 0  Suicidal thoughts 0 0 0 0 0  PHQ-9 Score 6 9 18 7 7   Difficult doing work/chores Somewhat difficult Somewhat difficult Very difficult Somewhat difficult Somewhat difficult   PHQ-2/9 Result is positive.    Fall Risk: Fall Risk  09/08/2018 08/05/2018 06/23/2018 03/23/2018  Falls in the past year? 0 0 0 No  Number falls in past yr: 0 - 0 -  Injury with Fall? 0 - 0 -     Assessment & Plan  1. Fibromyalgia syndrome  - pregabalin (LYRICA) 150 MG capsule; Take 1 capsule (150 mg total) by mouth 2 (two) times daily.  Dispense: 60 capsule; Refill: 2  2. Neuropathic pain  - pregabalin (LYRICA) 150 MG capsule; Take 1 capsule (150 mg total) by mouth 2 (two) times daily.  Dispense: 60 capsule; Refill: 2  3. Deficiency of vitamin B12  - CBC with Differential/Platelet - B12 and Folate Panel  4. Mood disorder (HCC)  Continue  Lyrica, mood has improved    5. Vitamin D deficiency  Continue vitamin D supplementation   6. Chronic constipation  - polyethylene glycol (MIRALAX / GLYCOLAX) packet; Take 17 g by mouth daily.  Dispense: 30 each; Refill: 2  I discussed the assessment and treatment plan with the patient. The patient was provided an opportunity to ask questions and all were answered. The patient agreed with the plan and demonstrated an understanding of the instructions.  The patient was advised to call back or seek an in-person evaluation if the symptoms worsen or if the condition fails to improve as anticipated.  I provided 25  minutes of non-face-to-face time during this encounter.

## 2018-09-09 DIAGNOSIS — E538 Deficiency of other specified B group vitamins: Secondary | ICD-10-CM | POA: Diagnosis not present

## 2018-09-10 LAB — CBC WITH DIFFERENTIAL/PLATELET
ABSOLUTE MONOCYTES: 468 {cells}/uL (ref 200–950)
Basophils Absolute: 32 cells/uL (ref 0–200)
Basophils Relative: 0.7 %
EOS ABS: 68 {cells}/uL (ref 15–500)
Eosinophils Relative: 1.5 %
HCT: 36.8 % (ref 35.0–45.0)
Hemoglobin: 12.3 g/dL (ref 11.7–15.5)
Lymphs Abs: 2066 cells/uL (ref 850–3900)
MCH: 29.8 pg (ref 27.0–33.0)
MCHC: 33.4 g/dL (ref 32.0–36.0)
MCV: 89.1 fL (ref 80.0–100.0)
MONOS PCT: 10.4 %
MPV: 11.1 fL (ref 7.5–12.5)
Neutro Abs: 1868 cells/uL (ref 1500–7800)
Neutrophils Relative %: 41.5 %
Platelets: 249 10*3/uL (ref 140–400)
RBC: 4.13 10*6/uL (ref 3.80–5.10)
RDW: 12.5 % (ref 11.0–15.0)
Total Lymphocyte: 45.9 %
WBC: 4.5 10*3/uL (ref 3.8–10.8)

## 2018-09-10 LAB — B12 AND FOLATE PANEL
Folate: 11.2 ng/mL
Vitamin B-12: 434 pg/mL (ref 200–1100)

## 2018-11-11 ENCOUNTER — Ambulatory Visit: Payer: Self-pay | Admitting: *Deleted

## 2018-11-11 ENCOUNTER — Ambulatory Visit (INDEPENDENT_AMBULATORY_CARE_PROVIDER_SITE_OTHER): Payer: BLUE CROSS/BLUE SHIELD | Admitting: Nurse Practitioner

## 2018-11-11 ENCOUNTER — Encounter: Payer: Self-pay | Admitting: Nurse Practitioner

## 2018-11-11 ENCOUNTER — Other Ambulatory Visit: Payer: Self-pay

## 2018-11-11 VITALS — BP 124/82 | HR 100 | Temp 98.7°F | Resp 14 | Wt 171.5 lb

## 2018-11-11 DIAGNOSIS — R1033 Periumbilical pain: Secondary | ICD-10-CM | POA: Diagnosis not present

## 2018-11-11 DIAGNOSIS — K529 Noninfective gastroenteritis and colitis, unspecified: Secondary | ICD-10-CM | POA: Diagnosis not present

## 2018-11-11 MED ORDER — DICYCLOMINE HCL 10 MG PO CAPS: 10.0000 mg | ORAL_CAPSULE | Freq: Three times a day (TID) | ORAL | 0 refills | Status: DC

## 2018-11-11 MED ORDER — ONDANSETRON 4 MG PO TBDP: 4.0000 mg | ORAL_TABLET | Freq: Three times a day (TID) | ORAL | 0 refills | Status: DC | PRN

## 2018-11-11 NOTE — Patient Instructions (Addendum)
- If symptoms worsen or do not improve please let us know -It can take up to 2 -4 weeks for stools to be back to normal but you should have improvement daily  Gastroenteritis (also known as stomach flu, although unrelated to influenza) is inflammation of the gastrointestinal tract, involving both the stomach and intestines. How did I get it? Gastroenteritis can have many causes, including viral or bacterial infections, medication reactions, food allergies, food/water poisoning or abuse of laxatives or alcohol. The duration and severity of the condition is relative to the illness. What are the symptoms? Symptoms can include fatigue, lack of appetite, abdominal growling and cramping, nausea, vomiting and/or diarrhea and are usually brief. Typically, no serious consequences occur and the condition resolves itself in a few days without medical treatment. How do I treat it? -Take nausea medicine as needed for nausea. If needed you can take loperamide (Imodium) for diarrhea, but avoid taking it for more than 2 days at a time as it can then lead to constipation.  -Drink fluids and get plenty of rest. Do not consume alcohol or caffeine. Avoid medications containing aspirin or ibuprofen, which may irritate your stomach, and do not take any medications by mouth unless directed by your medical care provider. 1. Drink clear liquids. Sip water/half-strength sports drinks or suck on ice chips. If you vomit using this treatment, do not take anything for 1 hour and start over again. 2. If you do not vomit fluids, you may progress to full-strength sports drinks; popsicles; clear broth; bouillon; decaf tea; clear apple juice; plain-flavored gelatin; and half-strength, clear, carbonated beverages without fizz (ginger ale, lemon-lime sodas, etc.). NOTE: To remove the fizz from soda, pour some into a glass and stir with a spoon. 3. As you become hungry, try moving to soft foods. Some examples include: saltine crackers, dry  white bread/toast, bananas, apple sauce, plain white rice, soft cereals prepared with water, plain noodles and broth soups. Do not use sauces or condiments, including butter. You may return to a normal diet as tolerated within 24 hours after recovery from vomiting. Recommended diets THINGS TO AVOID WHILE RECOVERING: . Alcohol  . Caffeine  . Dairy products  . Citrus products  . Fatty, greasy and/or fried foods  . Raw fruits and vegetables  . Aspirin  . Ibuprofen CLEAR LIQUID DIET: . Apple, grape or cranberry juice  . Kool-Aid  . Fruit punch  . Gatorade  . Ginger ale or 7UP  . Decaf tea  . Clear bouillon  . Jello  . Popsicles  . Fruit ice  . Salt FULL LIQUID DIET: . Cocoa  . Carbonated, decaf beverages  . Broth  . Strained, bland soups  . Cream of wheat or rice cereals  . Denzil Magnuson  . Vegetable juices  . Strained fruit juices or nectars  . Sherbets  . Honey  . Cinnamon  . Nutmeg  . Vanilla or other extracts CLEAR LIQUID DIET, PLUS: . Coffee  . White bread or toast  . Cooked or ready-to-eat cereal (no bran)  . Graham crackers  . Saltines  . Pasta or rice  . Soft, cooked vegetables  . Boiled or mashed potatoes  . Apple sauce  . Bananas or seedless melon  . Cooked or canned fruits  . Mild cheese or cottage cheese SOFT FULL LIQUID DIET, PLUS: . Soft-cooked, poached or hard-boiled or scrambled eggs  . Tender meat, fish or poultry  . Soft cake or cookies without nuts or raisins  . Butter,  cream or margarine  . Jelly  SEEK MEDICAL TREATMENT IF: . You are unable to keep fluids down.  . You see blood or mucus in your stool.  . You vomit black or dark red material.  . You have a fever of 101?F (38.33?C) or higher.  . You have localized and/or persistent abdominal pain.

## 2018-11-11 NOTE — Telephone Encounter (Signed)
Pt called with complaints of watery diarrhea from 11/08/18 to 11/10/18; she is having abdominal pain which started on 11/09/2018 around 0500 and continues; the pt says that the right upper abdomen from her belly button up is painful; she rates her pain at 7 out of 10, and worsens with standing or trying to lay down; her temp was 99.0 on 11/10/18; nurse triage initiated and recommendations made per protocol; she verbalizes understanding; she normally sees Dr Carlynn Purl at [place]; pt transferred to [facility] for scheduling; will route to office for notification.  Reason for Disposition . [1] MODERATE pain (e.g., interferes with normal activities) AND [2] pain comes and goes (cramps) AND [3] present > 24 hours  (Exception: pain with Vomiting or Diarrhea - see that Guideline)  Answer Assessment - Initial Assessment Questions 1. LOCATION: "Where does it hurt?"      Right upper abdomen from belly button up 2. RADIATION: "Does the pain shoot anywhere else?" (e.g., chest, back)     no 3. ONSET: "When did the pain begin?" (e.g., minutes, hours or days ago)      11/09/2018 4. SUDDEN: "Gradual or sudden onset?"   suddenly 5. PATTERN "Does the pain come and go, or is it constant?"    - If constant: "Is it getting better, staying the same, or worsening?"      (Note: Constant means the pain never goes away completely; most serious pain is constant and it progresses)     - If intermittent: "How long does it last?" "Do you have pain now?"     (Note: Intermittent means the pain goes away completely between bouts)     Depends on how the pt is sitting 6. SEVERITY: "How bad is the pain?"  (e.g., Scale 1-10; mild, moderate, or severe)   - MILD (1-3): doesn't interfere with normal activities, abdomen soft and not tender to touch    - MODERATE (4-7): interferes with normal activities or awakens from sleep, tender to touch    - SEVERE (8-10): excruciating pain, doubled over, unable to do any normal activities      7 out  of 10 7. RECURRENT SYMPTOM: "Have you ever had this type of abdominal pain before?" If so, ask: "When was the last time?" and "What happened that time?"      Yes, never diagnosed 8. CAUSE: "What do you think is causing the abdominal pain?"    Not sure 9. RELIEVING/AGGRAVATING FACTORS: "What makes it better or worse?" (e.g., movement, antacids, bowel movement)     Nothing makes better 10. OTHER SYMPTOMS: "Has there been any vomiting, diarrhea, constipation, or urine problems?"      Flatus, diarrhea that has resolved 11. PREGNANCY: "Is there any chance you are pregnant?" "When was your last menstrual period?"       No, LMP May 2020, irregular  Protocols used: ABDOMINAL PAIN - [facility]

## 2018-11-11 NOTE — Progress Notes (Signed)
Name: Deborah RaringBrandi H Townsel   MRN: [id number]    DOB: 1980-10-29   Date:11/11/2018       Progress Note  Subjective  Chief Complaint  Chief Complaint  Patient presents with  . Abdominal Pain    Epigastric, onset 2 weeks ago. Low grade fever, nausea, diarrhea.     HPI  Patient states on Tuesday started having diarrhea that was constant for the first 12 hours. States the following morning she was getting ready for work was fatigued and started to feel nauseated and diarrhea had slowed down. States has been able to keep food in her since yesterday. Has been eating bland foods- cheese toast, chicken noodle soup and sprite. Dark yellow urine. Endorses intermittent mid abdominal pain- states starts mild crampy pain that can intensifies for a few seconds and resolves, sometimes position changes help. Last bowel movement was yesterday bristol scale 6- improvement from straight watery.   Endorses low grade fever yesterday- 99.0. No vomiting, dizziness  PHQ2/9: Depression screen [facility] 2/9 11/11/2018 09/08/2018 08/05/2018 06/23/2018 05/04/2018  Decreased Interest 0 0 1 3 1   Down, Depressed, Hopeless 0 0 1 2 1   PHQ - 2 Score 0 0 2 5 2   Altered sleeping 0 1 1 3 1   Tired, decreased energy 0 3 1 3 3   Change in appetite 0 1 2 3  0  Feeling bad or failure about yourself  0 0 1 2 1   Trouble concentrating 0 1 1 1  0  Moving slowly or fidgety/restless 0 0 1 1 0  Suicidal thoughts 0 0 0 0 0  PHQ-9 Score 0 6 9 18 7   Difficult doing work/chores Not difficult at all Somewhat difficult Somewhat difficult Very difficult Somewhat difficult     PHQ reviewed. Negative  Patient Active Problem List   Diagnosis Date Noted  . Mood disorder (HCC) 08/05/2018  . Papanicolaou smear of cervix with positive high risk human papilloma virus (HPV) test 05/04/2018  . False positive ana 04/28/2018  . Pain in both feet 04/28/2018  . Facial rash 04/07/2018  . Chronic fatigue 04/07/2018  . Recurrent abdominal pain 10/12/2017  .  Chronic neck pain 03/27/2016  . Myalgia 03/27/2016  . Left-sided headache 03/27/2016  . Family history of early CAD 03/15/2015  . History of colon polyps 06/30/2010  . History of anemia 06/30/2010  . Depression, major, in remission (HCC) 06/30/2010  . Endometriosis 06/30/2010  . CARDIAC MURMUR 06/30/2010  . History of chicken pox 06/30/2010    Past Medical History:  Diagnosis Date  . Anemia, unspecified   . ASD (atrial septal defect)    repaired 1996  . Blood transfusion, without reported diagnosis   . Depressive disorder, not elsewhere classified   . Elevated blood pressure reading without diagnosis of hypertension   . Endometriosis of fallopian tube   . Family history of adverse reaction to anesthesia    Father - PONV  . Irritable bowel syndrome   . PONV (postoperative nausea and vomiting)   . Streptococcal meningitis   . Undiagnosed cardiac murmurs   . Unspecified personal history presenting hazards to health   . Wears contact lenses     Past Surgical History:  Procedure Laterality Date  . COLONOSCOPY WITH PROPOFOL N/A 05/16/2018   Procedure: COLONOSCOPY WITH PROPOFOL;  Surgeon: Midge MiniumWohl, Darren, MD;  [facility];  Service: Endoscopy;  Laterality: N/A;  . DILATION AND CURETTAGE OF UTERUS    . LAPAROSCOPIC ENDOMETRIOSIS FULGURATION  10-1999   endometriosis  . NECK  SURGERY N/A 06/14/15   due to C5/6 C6-7 disc herniation   . open heart surgery  12-1994   asd repair    Social History   Tobacco Use  . Smoking status: Never Smoker  . Smokeless tobacco: Never Used  Substance Use Topics  . Alcohol use: Not Currently    Alcohol/week: 1.0 standard drinks    Types: 1 Shots of liquor per week    Comment: very occasionally (couple x/yr)     Current Outpatient Medications:  .  pregabalin (LYRICA) 150 MG capsule, Take 1 capsule (150 mg total) by mouth 2 (two) times daily., Disp: 60 capsule, Rfl: 2 .  Cholecalciferol (VITAMIN D) 50 MCG (2000 UT) CAPS, Take 1  capsule (2,000 Units total) by mouth daily. (Patient not taking: Reported on 11/11/2018), Disp: 30 capsule, Rfl: 0 .  Cyanocobalamin (B-12) 1000 MCG SUBL, Place 1 tablet under the tongue daily. (Patient not taking: Reported on 11/11/2018), Disp: 30 each, Rfl: 0 .  polyethylene glycol (MIRALAX / GLYCOLAX) packet, Take 17 g by mouth daily. (Patient not taking: Reported on 11/11/2018), Disp: 30 each, Rfl: 2  No Known Allergies  ROS   No other specific complaints in a complete review of systems (except as listed in HPI above).  Objective  Vitals:   11/11/18 1404  BP: 124/82  Pulse: 100  Resp: 14  Temp: 98.7 F (37.1 C)  SpO2: 98%  Weight: 171 lb 8 oz (77.8 kg)     Body mass index is 30.38 kg/m.  Nursing Note and Vital Signs reviewed.  Physical Exam Constitutional:      Appearance: Normal appearance. She is well-developed.  HENT:     Head: Normocephalic and atraumatic.     Right Ear: Hearing normal.     Left Ear: Hearing normal.  Eyes:     Conjunctiva/sclera: Conjunctivae normal.  Cardiovascular:     Rate and Rhythm: Normal rate and regular rhythm.     Heart sounds: Normal heart sounds.  Pulmonary:     Effort: Pulmonary effort is normal.     Breath sounds: Normal breath sounds.  Abdominal:     General: Bowel sounds are normal.     Tenderness: There is abdominal tenderness in the periumbilical area. There is no right CVA tenderness or left CVA tenderness. Negative signs include Murphy's sign and McBurney's sign.  Musculoskeletal: Normal range of motion.  Skin:    General: Skin is warm and dry.  Neurological:     Mental Status: She is alert and oriented to person, place, and time.  Psychiatric:        Speech: Speech normal.        Behavior: Behavior normal. Behavior is cooperative.        Thought Content: Thought content normal.        Judgment: Judgment normal.      No results found for this or any previous visit (from the past 48 hour(s)).  Assessment &  Plan  1. Gastroenteritis - COMPLETE METABOLIC PANEL WITH GFR - CBC - dicyclomine (BENTYL) 10 MG capsule; Take 1 capsule (10 mg total) by mouth 4 (four) times daily -  before meals and at bedtime.  Dispense: 30 capsule; Refill: 0 - ondansetron (ZOFRAN-ODT) 4 MG disintegrating tablet; Take 1 tablet (4 mg total) by mouth every 8 (eight) hours as needed for nausea or vomiting.  Dispense: 20 tablet; Refill: 0  2. Periumbilical abdominal pain - COMPLETE METABOLIC PANEL WITH GFR - CBC - dicyclomine (BENTYL) 10 MG capsule;  Take 1 capsule (10 mg total) by mouth 4 (four) times daily -  before meals and at bedtime.  Dispense: 30 capsule; Refill: 0

## 2018-11-12 LAB — CBC
HCT: 38.5 % (ref 35.0–45.0)
Hemoglobin: 12.9 g/dL (ref 11.7–15.5)
MCH: 29.7 pg (ref 27.0–33.0)
MCHC: 33.5 g/dL (ref 32.0–36.0)
MCV: 88.7 fL (ref 80.0–100.0)
MPV: 10.7 fL (ref 7.5–12.5)
Platelets: 245 10*3/uL (ref 140–400)
RBC: 4.34 10*6/uL (ref 3.80–5.10)
RDW: 12 % (ref 11.0–15.0)
WBC: 5 10*3/uL (ref 3.8–10.8)

## 2018-11-12 LAB — COMPLETE METABOLIC PANEL WITH GFR
AG Ratio: 1.4 (calc) (ref 1.0–2.5)
ALT: 12 U/L (ref 6–29)
AST: 12 U/L (ref 10–30)
Albumin: 4.1 g/dL (ref 3.6–5.1)
Alkaline phosphatase (APISO): 60 U/L (ref 31–125)
BUN: 10 mg/dL (ref 7–25)
CO2: 25 mmol/L (ref 20–32)
Calcium: 9.4 mg/dL (ref 8.6–10.2)
Chloride: 106 mmol/L (ref 98–110)
Creat: 0.79 mg/dL (ref 0.50–1.10)
GFR, Est African American: 109 mL/min/{1.73_m2} (ref >=60)
GFR, Est Non African American: 94 mL/min/{1.73_m2} (ref >=60)
Globulin: 2.9 g/dL (calc) (ref 1.9–3.7)
Glucose, Bld: 102 mg/dL — ABNORMAL HIGH (ref 65–99)
Potassium: 3.9 mmol/L (ref 3.5–5.3)
Sodium: 140 mmol/L (ref 135–146)
Total Bilirubin: 0.3 mg/dL (ref 0.2–1.2)
Total Protein: 7 g/dL (ref 6.1–8.1)

## 2018-12-21 ENCOUNTER — Other Ambulatory Visit: Payer: Self-pay

## 2018-12-21 ENCOUNTER — Ambulatory Visit (INDEPENDENT_AMBULATORY_CARE_PROVIDER_SITE_OTHER): Payer: BC Managed Care – PPO | Admitting: Family Medicine

## 2018-12-21 ENCOUNTER — Encounter: Payer: Self-pay | Admitting: Family Medicine

## 2018-12-21 VITALS — BP 118/64 | HR 77 | Temp 97.5°F | Resp 16 | Ht 63.0 in | Wt 183.3 lb

## 2018-12-21 DIAGNOSIS — F39 Unspecified mood [affective] disorder: Secondary | ICD-10-CM | POA: Diagnosis not present

## 2018-12-21 DIAGNOSIS — M797 Fibromyalgia: Secondary | ICD-10-CM | POA: Diagnosis not present

## 2018-12-21 DIAGNOSIS — K5909 Other constipation: Secondary | ICD-10-CM

## 2018-12-21 DIAGNOSIS — E538 Deficiency of other specified B group vitamins: Secondary | ICD-10-CM | POA: Diagnosis not present

## 2018-12-21 DIAGNOSIS — M792 Neuralgia and neuritis, unspecified: Secondary | ICD-10-CM

## 2018-12-21 MED ORDER — B-12 1000 MCG SL SUBL: 1.0000 | SUBLINGUAL_TABLET | Freq: Every day | SUBLINGUAL | 0 refills | Status: AC

## 2018-12-21 MED ORDER — PREGABALIN 150 MG PO CAPS: 150.0000 mg | ORAL_CAPSULE | Freq: Three times a day (TID) | ORAL | 1 refills | Status: DC

## 2018-12-21 NOTE — Progress Notes (Signed)
Name: Deborah Navarro   MRN: [id number]    DOB: [birth year]   Date:12/21/2018       Progress Note  Subjective  Chief Complaint  Chief Complaint  Patient presents with  . Medication Refill    3 month F/U  . Mood Disorder  . Chronic Constipation  . Marital problems  . Neuropathic Pain/FMS    HPI  Marital problems: she separated from husband in 2012 for two years but has been back since 2014. He works one full time job, and she is working 3 jobs and is tired. She pays all the bills, she is always the one stressed out. However since pandemic, he has been working more days and has been helping her a little at home. He has cleaned the house a couple of times, also cooking for her today . Frustrated because he refuses to wear a surgical mask otherwise things are a little better   MDD: she was sleepy with Abilify, Vraylar not covered by insurance, could not tolerate Duloxetine. She states sleeping better with lyrica and has also noticed an improvement of her mood with medication. She states husband is working more hours and that has helped also, he is also helping around the house, she took some time off from her part time job. Phq 9 is stable with phq9 of 6   Neuropathic pain/FMS: she continues to have pain all over her body. She states Lyrica helped with her sleep but not sure about the pain, states changed position at work and moving less, but her feel stiff. She is currently taking 150 mg BID , pain is described as numbness, tingling, burning and sharp on hands and feet, she has a history of b12 deficiency and is taking SL supplementation. She states pain varies, she has noticed that when she squats her calf burns  Chronic Constipation: seen by GI and given Linzess but too expensive, she is back on Miralax prn, she states recently not taking medication daily because she has not been able to drink as much and unable to take Miralax but has been having a bowel movement a couple of times a week    Patient Active Problem List   Diagnosis Date Noted  . Mood disorder (HCC) 08/05/2018  . Papanicolaou smear of cervix with positive high risk human papilloma virus (HPV) test 05/04/2018  . False positive ana 04/28/2018  . Pain in both feet 04/28/2018  . Facial rash 04/07/2018  . Chronic fatigue 04/07/2018  . Recurrent abdominal pain 10/12/2017  . Chronic neck pain 03/27/2016  . Myalgia 03/27/2016  . Left-sided headache 03/27/2016  . Family history of early CAD 03/15/2015  . History of colon polyps 06/30/2010  . History of anemia 06/30/2010  . Depression, major, in remission (HCC) 06/30/2010  . Endometriosis 06/30/2010  . CARDIAC MURMUR 06/30/2010  . History of chicken pox 06/30/2010    Past Surgical History:  Procedure Laterality Date  . COLONOSCOPY WITH PROPOFOL N/A 05/16/2018   Procedure: COLONOSCOPY WITH PROPOFOL;  Surgeon: Midge MiniumWohl, Darren, MD;  [facility];  Service: Endoscopy;  Laterality: N/A;  . DILATION AND CURETTAGE OF UTERUS    . LAPAROSCOPIC ENDOMETRIOSIS FULGURATION  10-1999   endometriosis  . NECK SURGERY N/A 06/14/15   due to C5/6 C6-7 disc herniation   . open heart surgery  12-1994   asd repair    Family History  Problem Relation Age of Onset  . Cancer Mother [id number]       non hodgkins lymphoma(mets  to brain)  . Coronary artery disease Father   . Hypertension Father   . Hyperlipidemia Father   . Diabetes Father   . Diabetes Maternal Grandmother   . Heart attack Maternal Grandfather   . Other Maternal Grandfather        Deterioration of Jaw  . Stroke Paternal Grandmother   . Stroke Paternal Grandfather     Social History   Socioeconomic History  . Marital status: Married    Spouse name: [name]  . Number of children: 1  . Years of education: Not on file  . Highest education level: Not on file  Occupational History  . Occupation: Lobbyist: [employer]  . Financial resource strain: Not hard  at all  . Food insecurity    Worry: Never true    Inability: Never true  . Transportation needs    Medical: No    Non-medical: No  Tobacco Use  . Smoking status: Never Smoker  . Smokeless tobacco: Never Used  Substance and Sexual Activity  . Alcohol use: Not Currently    Alcohol/week: 1.0 standard drinks    Types: 1 Shots of liquor per week    Comment: very occasionally (couple x/yr)  . Drug use: No  . Sexual activity: Yes    Partners: Male  Lifestyle  . Physical activity    Days per week: 0 days    Minutes per session: 0 min  . Stress: Not at all  Relationships  . Social Herbalist on phone: Three times a week    Gets together: Three times a week    Attends religious service: Never    Active member of club or organization: Yes    Attends meetings of clubs or organizations: More than 4 times per year    Relationship status: Married  . Intimate partner violence    Fear of current or ex partner: No    Emotionally abused: No    Physically abused: No    Forced sexual activity: No  Other Topics Concern  . Not on file  Social History Narrative   Working 3 jobs currently     Current Outpatient Medications:  .  pregabalin (LYRICA) 150 MG capsule, Take 1 capsule (150 mg total) by mouth 2 (two) times daily., Disp: 60 capsule, Rfl: 2 .  dicyclomine (BENTYL) 10 MG capsule, Take 1 capsule (10 mg total) by mouth 4 (four) times daily -  before meals and at bedtime. (Patient not taking: Reported on 12/21/2018), Disp: 30 capsule, Rfl: 0 .  ondansetron (ZOFRAN-ODT) 4 MG disintegrating tablet, Take 1 tablet (4 mg total) by mouth every 8 (eight) hours as needed for nausea or vomiting. (Patient not taking: Reported on 12/21/2018), Disp: 20 tablet, Rfl: 0  No Known Allergies  I personally reviewed active problem list, medication list, allergies, family history, social history with the patient/caregiver today.   ROS  Constitutional: Negative for fever or weight change.   Respiratory: Negative for cough and shortness of breath.   Cardiovascular: Negative for chest pain or palpitations.  Gastrointestinal: Negative for abdominal pain, no bowel changes.  Musculoskeletal: Negative for gait problem or joint swelling.  Skin: Negative for rash.  Neurological: Negative for dizziness or headache.  No other specific complaints in a complete review of systems (except as listed in HPI above).  Objective  Vitals:   12/21/18 1542  BP: 118/64  Pulse: 77  Resp: 16  Temp: Marland Kitchen)  97.5 F (36.4 C)  TempSrc: Temporal  SpO2: 99%  Weight: 183 lb 4.8 oz (83.1 kg)  Height: 5\' 3"  (1.6 m)    Body mass index is 32.47 kg/m.  Physical Exam  Constitutional: Patient appears well-developed and well-nourished. Obese No distress.  HEENT: head atraumatic, normocephalic, pupils equal and reactive to light,  neck supple, throat within normal limits Cardiovascular: Normal rate, regular rhythm and normal heart sounds.  No murmur heard. No BLE edema. Pulmonary/Chest: Effort normal and breath sounds normal. No respiratory distress. Abdominal: Soft.  There is no tenderness. Psychiatric: Patient has a normal mood and affect. behavior is normal. Judgment and thought content normal.  Recent Results (from the past 2160 hour(s))  COMPLETE METABOLIC PANEL WITH GFR     Status: Abnormal   Collection Time: 11/11/18  2:36 PM  Result Value Ref Range   Glucose, Bld 102 (H) 65 - 99 mg/dL    Comment: .            Fasting reference interval . For someone without known diabetes, a glucose value between 100 and 125 mg/dL is consistent with prediabetes and should be confirmed with a follow-up test. .    BUN 10 7 - 25 mg/dL   Creat 1.610.79 0.960.50 - 0.451.10 mg/dL   GFR, Est Non African American 94 > OR = 60 mL/min/1.5273m2   GFR, Est African American 109 > OR = 60 mL/min/1.8973m2   BUN/Creatinine Ratio NOT APPLICABLE 6 - 22 (calc)   Sodium 140 135 - 146 mmol/L   Potassium 3.9 3.5 - 5.3 mmol/L    Chloride 106 98 - 110 mmol/L   CO2 25 20 - 32 mmol/L   Calcium 9.4 8.6 - 10.2 mg/dL   Total Protein 7.0 6.1 - 8.1 g/dL   Albumin 4.1 3.6 - 5.1 g/dL   Globulin 2.9 1.9 - 3.7 g/dL (calc)   AG Ratio 1.4 1.0 - 2.5 (calc)   Total Bilirubin 0.3 0.2 - 1.2 mg/dL   Alkaline phosphatase (APISO) 60 31 - 125 U/L   AST 12 10 - 30 U/L   ALT 12 6 - 29 U/L  CBC     Status: None   Collection Time: 11/11/18  2:36 PM  Result Value Ref Range   WBC 5.0 3.8 - 10.8 Thousand/uL   RBC 4.34 3.80 - 5.10 Million/uL   Hemoglobin 12.9 11.7 - 15.5 g/dL   HCT 40.938.5 81.135.0 - 91.445.0 %   MCV 88.7 80.0 - 100.0 fL   MCH 29.7 27.0 - 33.0 pg   MCHC 33.5 32.0 - 36.0 g/dL   RDW 78.212.0 95.611.0 - 21.315.0 %   Platelets 245 140 - 400 Thousand/uL   MPV 10.7 7.5 - 12.5 fL      PHQ2/9: Depression screen [facility] 12/21/2018 11/11/2018 09/08/2018 08/05/2018 06/23/2018  Decreased Interest 1 0 0 1 3  Down, Depressed, Hopeless 0 0 0 1 2  PHQ - 2 Score 1 0 0 2 5  Altered sleeping 2 0 1 1 3   Tired, decreased energy 1 0 3 1 3   Change in appetite 2 0 1 2 3   Feeling bad or failure about yourself  0 0 0 1 2  Trouble concentrating 0 0 1 1 1   Moving slowly or fidgety/restless 0 0 0 1 1  Suicidal thoughts 0 0 0 0 0  PHQ-9 Score 6 0 6 9 18   Difficult doing work/chores Somewhat difficult Not difficult at all Somewhat difficult Somewhat difficult Very difficult  phq 9 is positive   Fall Risk: Fall Risk  12/21/2018 11/11/2018 09/08/2018 08/05/2018 06/23/2018  Falls in the past year? 0 0 0 0 0  Number falls in past yr: 0 - 0 - 0  Injury with Fall? 0 - 0 - 0     Functional Status Survey: Is the patient deaf or have difficulty hearing?: No Does the patient have difficulty seeing, even when wearing glasses/contacts?: Yes Does the patient have difficulty concentrating, remembering, or making decisions?: No Does the patient have difficulty walking or climbing stairs?: No Does the patient have difficulty dressing or bathing?: No Does the patient have  difficulty doing errands alone such as visiting a doctor's office or shopping?: No    Assessment & Plan  1. Mood disorder (HCC)  - pregabalin (LYRICA) 150 MG capsule; Take 1 capsule (150 mg total) by mouth 3 (three) times daily.  Dispense: 270 capsule; Refill: 1  2. Fibromyalgia syndrome  - pregabalin (LYRICA) 150 MG capsule; Take 1 capsule (150 mg total) by mouth 3 (three) times daily.  Dispense: 270 capsule; Refill: 1  3. B12 deficiency  - Cyanocobalamin (B-12) 1000 MCG SUBL; Place 1 tablet under the tongue daily.  Dispense: 30 tablet; Refill: 0  4. Chronic constipation   5. Neuropathic pain  - pregabalin (LYRICA) 150 MG capsule; Take 1 capsule (150 mg total) by mouth 3 (three) times daily.  Dispense: 270 capsule; Refill: 1

## 2019-03-22 ENCOUNTER — Other Ambulatory Visit: Payer: Self-pay | Admitting: Family Medicine

## 2019-03-22 DIAGNOSIS — Z862 Personal history of diseases of the blood and blood-forming organs and certain disorders involving the immune mechanism: Secondary | ICD-10-CM

## 2019-03-23 ENCOUNTER — Ambulatory Visit (INDEPENDENT_AMBULATORY_CARE_PROVIDER_SITE_OTHER): Payer: BC Managed Care – PPO | Admitting: Family Medicine

## 2019-03-23 ENCOUNTER — Other Ambulatory Visit: Payer: Self-pay

## 2019-03-23 ENCOUNTER — Other Ambulatory Visit
Admission: RE | Admit: 2019-03-23 | Discharge: 2019-03-23 | Disposition: A | Discharge status: Home / Self Care | Payer: BC Managed Care – PPO | Source: Ambulatory Visit | Attending: Family Medicine | Admitting: Family Medicine

## 2019-03-23 ENCOUNTER — Encounter: Payer: Self-pay | Admitting: Family Medicine

## 2019-03-23 VITALS — BP 120/82 | HR 60 | Temp 97.5°F | Resp 16 | Ht 63.0 in | Wt 183.7 lb

## 2019-03-23 DIAGNOSIS — M25541 Pain in joints of right hand: Secondary | ICD-10-CM

## 2019-03-23 DIAGNOSIS — E785 Hyperlipidemia, unspecified: Secondary | ICD-10-CM | POA: Diagnosis not present

## 2019-03-23 DIAGNOSIS — M797 Fibromyalgia: Secondary | ICD-10-CM | POA: Diagnosis not present

## 2019-03-23 DIAGNOSIS — E559 Vitamin D deficiency, unspecified: Secondary | ICD-10-CM | POA: Diagnosis not present

## 2019-03-23 DIAGNOSIS — E538 Deficiency of other specified B group vitamins: Secondary | ICD-10-CM | POA: Diagnosis not present

## 2019-03-23 DIAGNOSIS — Z131 Encounter for screening for diabetes mellitus: Secondary | ICD-10-CM

## 2019-03-23 DIAGNOSIS — Z20828 Contact with and (suspected) exposure to other viral communicable diseases: Secondary | ICD-10-CM | POA: Diagnosis not present

## 2019-03-23 DIAGNOSIS — F39 Unspecified mood [affective] disorder: Secondary | ICD-10-CM | POA: Diagnosis not present

## 2019-03-23 DIAGNOSIS — R5383 Other fatigue: Secondary | ICD-10-CM

## 2019-03-23 DIAGNOSIS — M25542 Pain in joints of left hand: Secondary | ICD-10-CM | POA: Insufficient documentation

## 2019-03-23 DIAGNOSIS — J069 Acute upper respiratory infection, unspecified: Secondary | ICD-10-CM

## 2019-03-23 DIAGNOSIS — Z20822 Contact with and (suspected) exposure to covid-19: Secondary | ICD-10-CM

## 2019-03-23 DIAGNOSIS — K5909 Other constipation: Secondary | ICD-10-CM

## 2019-03-23 LAB — CBC WITH DIFFERENTIAL/PLATELET
Abs Immature Granulocytes: 0.02 10*3/uL (ref 0.00–0.07)
Basophils Absolute: 0.1 10*3/uL (ref 0.0–0.1)
Basophils Relative: 1 %
Eosinophils Absolute: 0.1 10*3/uL (ref 0.0–0.5)
Eosinophils Relative: 1 %
HCT: 36.1 % (ref 36.0–46.0)
Hemoglobin: 12.1 g/dL (ref 12.0–15.0)
Immature Granulocytes: 0 %
Lymphocytes Relative: 28 %
Lymphs Abs: 1.5 10*3/uL (ref 0.7–4.0)
MCH: 29.6 pg (ref 26.0–34.0)
MCHC: 33.5 g/dL (ref 30.0–36.0)
MCV: 88.3 fL (ref 80.0–100.0)
Monocytes Absolute: 0.5 10*3/uL (ref 0.1–1.0)
Monocytes Relative: 10 %
Neutro Abs: 3.2 10*3/uL (ref 1.7–7.7)
Neutrophils Relative %: 60 %
Platelets: 243 10*3/uL (ref 150–400)
RBC: 4.09 MIL/uL (ref 3.87–5.11)
RDW: 12.7 % (ref 11.5–15.5)
WBC: 5.4 10*3/uL (ref 4.0–10.5)
nRBC: 0 % (ref 0.0–0.2)

## 2019-03-23 LAB — COMPREHENSIVE METABOLIC PANEL
ALT: 12 U/L (ref 0–44)
AST: 15 U/L (ref 15–41)
Albumin: 4.1 g/dL (ref 3.5–5.0)
Alkaline Phosphatase: 59 U/L (ref 38–126)
Anion gap: 7 (ref 5–15)
BUN: 11 mg/dL (ref 6–20)
CO2: 26 mmol/L (ref 22–32)
Calcium: 9 mg/dL (ref 8.9–10.3)
Chloride: 105 mmol/L (ref 98–111)
Creatinine, Ser: 0.74 mg/dL (ref 0.44–1.00)
GFR calc Af Amer: >60 mL/min (ref >60)
GFR calc non Af Amer: >60 mL/min (ref >60)
Glucose, Bld: 89 mg/dL (ref 70–99)
Potassium: 4.3 mmol/L (ref 3.5–5.1)
Sodium: 138 mmol/L (ref 135–145)
Total Bilirubin: 0.4 mg/dL (ref 0.3–1.2)
Total Protein: 7.6 g/dL (ref 6.5–8.1)

## 2019-03-23 LAB — HEMOGLOBIN A1C
Hgb A1c MFr Bld: 5.4 % (ref 4.8–5.6)
Mean Plasma Glucose: 108.28 mg/dL

## 2019-03-23 LAB — LIPID PANEL
Cholesterol: 173 mg/dL (ref 0–200)
HDL: 46 mg/dL (ref >40)
LDL Cholesterol: 117 mg/dL — ABNORMAL HIGH (ref 0–99)
Total CHOL/HDL Ratio: 3.8 RATIO
Triglycerides: 52 mg/dL (ref <150)
VLDL: 10 mg/dL (ref 0–40)

## 2019-03-23 LAB — VITAMIN D 25 HYDROXY (VIT D DEFICIENCY, FRACTURES): Vit D, 25-Hydroxy: 20.8 ng/mL — ABNORMAL LOW (ref 30–100)

## 2019-03-23 LAB — SEDIMENTATION RATE: Sed Rate: 30 mm/hr — ABNORMAL HIGH (ref 0–20)

## 2019-03-23 LAB — C-REACTIVE PROTEIN: CRP: <0.8 mg/dL (ref <1.0)

## 2019-03-23 LAB — VITAMIN B12: Vitamin B-12: 261 pg/mL (ref 180–914)

## 2019-03-23 NOTE — Progress Notes (Signed)
Name: Deborah Navarro   MRN: [id number]    DOB: 10-22-1980   Date:03/23/2019       Progress Note  Subjective  Chief Complaint  Chief Complaint  Patient presents with  . Depression    HPI   MDD: she was sleepy with Abilify, Vraylar not covered by insurance, could not tolerate Duloxetine. She states sleeping better with lyricaand has also noticed an improvement of her mood with medication. 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 She states she has been feeling very tired lately, over the past few weeks it has been worse than usual. Explained it may be secondary to depression but more likely to be secondary to working full time and going to school. We will recheck some labs per her request  Neuropathic pain/FMS: she continues to have pain all over her body. She states Lyrica helped with her sleep but not sure about the pain, states changed position at work and moving less, but her feel stiff.She is currently taking 150 mg BID , pain is described as numbness, tingling, burning and sharp on hands and feet, she has a history of b12 deficiency and is taking SL supplementation. She states pain varies, she has noticed that when she squats her calf burns, recently having more pain on the balls of her feet - walks a lot at work and joints and body is just very achy. She has a history of mono and would like to have labs rechecked   Chronic Constipation: seen by GI and given Linzess but too expensive,, she has miralax at home and is taking it prn  URI: she has been having fatigue for weeks now, working 35 hours at [employer], also taking CMA classes but yesterday  also developed clear rhinorrhea, scratch throat this am and increase in neck pain ( she has chronic body aches) . She denies cough or SOB. She is aching all over but that has been going on  for weeks   Visit started in our office, but because of rhinorrhea, worsening of fatigue and scratch throat we switched to phone call after her physical exam was done inside the office    Patient Active Problem List   Diagnosis Date Noted  . Mood disorder (HCC) 08/05/2018  . Papanicolaou smear of cervix with positive high risk human papilloma virus (HPV) test 05/04/2018  . False positive ana 04/28/2018  . Pain in both feet 04/28/2018  . Facial rash 04/07/2018  . Chronic fatigue 04/07/2018  . Recurrent abdominal pain 10/12/2017  . Chronic neck pain 03/27/2016  . Myalgia 03/27/2016  . Left-sided headache 03/27/2016  . Family history of early CAD 03/15/2015  . History of colon polyps 06/30/2010  . History of anemia 06/30/2010  . Depression, major, in remission (HCC) 06/30/2010  . Endometriosis 06/30/2010  . CARDIAC MURMUR 06/30/2010  . History of chicken pox 06/30/2010    Past Surgical History:  Procedure Laterality Date  . COLONOSCOPY WITH PROPOFOL N/A 05/16/2018   Procedure: COLONOSCOPY WITH PROPOFOL;  Surgeon: Midge MiniumWohl, Darren, MD;  [facility];  Service: Endoscopy;  Laterality: N/A;  . DILATION AND CURETTAGE OF UTERUS    . LAPAROSCOPIC ENDOMETRIOSIS FULGURATION  10-1999   endometriosis  . NECK SURGERY N/A 06/14/15   due to C5/6 C6-7 disc herniation   . open heart  surgery  12-1994   asd repair    Family History  Problem Relation Age of Onset  . Cancer Mother 50       non hodgkins lymphoma(mets to brain)  . Coronary artery disease Father   . Hypertension Father   . Hyperlipidemia Father   . Diabetes Father   . Diabetes Maternal Grandmother   . Heart attack Maternal Grandfather   . Other Maternal Grandfather        Deterioration of Jaw  . Stroke Paternal Grandmother   . Stroke Paternal Grandfather     Social History   Socioeconomic History  . Marital status: Married    Spouse name: [name]  . Number of children: 1  . Years of education: Not on file   . Highest education level: Not on file  Occupational History  . Occupation: Lobbyist: [employer]  . Financial resource strain: Not hard at all  . Food insecurity    Worry: Never true    Inability: Never true  . Transportation needs    Medical: No    Non-medical: No  Tobacco Use  . Smoking status: Never Smoker  . Smokeless tobacco: Never Used  Substance and Sexual Activity  . Alcohol use: Not Currently    Alcohol/week: 1.0 standard drinks    Types: 1 Shots of liquor per week    Comment: very occasionally (couple x/yr)  . Drug use: No  . Sexual activity: Yes    Partners: Male  Lifestyle  . Physical activity    Days per week: 0 days    Minutes per session: 0 min  . Stress: Not at all  Relationships  . Social Herbalist on phone: Three times a week    Gets together: Three times a week    Attends religious service: Never    Active member of club or organization: Yes    Attends meetings of clubs or organizations: More than 4 times per year    Relationship status: Married  . Intimate partner violence    Fear of current or ex partner: No    Emotionally abused: No    Physically abused: No    Forced sexual activity: No  Other Topics Concern  . Not on file  Social History Narrative   Working 3 jobs currently     Current Outpatient Medications:  .  Cyanocobalamin (B-12) 1000 MCG SUBL, Place 1 tablet under the tongue daily., Disp: 30 tablet, Rfl: 0 .  pregabalin (LYRICA) 150 MG capsule, Take 1 capsule (150 mg total) by mouth 3 (three) times daily., Disp: 270 capsule, Rfl: 1  No Known Allergies  I personally reviewed active problem list, medication list, allergies, family history, social history, health maintenance with the patient/caregiver today.   ROS  Ten systems reviewed and is negative except as mentioned in HPI   Objective  Vitals:   03/23/19 0801  BP: 120/82  Pulse: 60  Resp: 16  Temp: (!) 97.5 F  (36.4 C)  TempSrc: Temporal  SpO2: 95%  Weight: 183 lb 11.2 oz (83.3 kg)  Height: 5\' 3"  (1.6 m)    Body mass index is 32.54 kg/m.  Physical Exam  Constitutional: Patient appears well-developed and well-nourished. Obese  No distress.  HEENT: head atraumatic, normocephalic, pupils equal and reactive to light, sniffling but oral and nose exam not done  Cardiovascular: Normal rate, regular rhythm and normal heart sounds.  No murmur heard. No  BLE edema. Pulmonary/Chest: Effort normal and breath sounds normal. No respiratory distress. Abdominal: Soft.  There is no tenderness. Psychiatric: Patient has a normal mood and affect. behavior is normal. Judgment and thought content normal.   PHQ2/9: Depression screen [facility] 2/9 03/23/2019 12/21/2018 11/11/2018 09/08/2018 08/05/2018  Decreased Interest 0 1 0 0 1  Down, Depressed, Hopeless 0 0 0 0 1  PHQ - 2 Score 0 1 0 0 2  Altered sleeping 0 2 0 1 1  Tired, decreased energy 3 1 0 3 1  Change in appetite 0 2 0 1 2  Feeling bad or failure about yourself  0 0 0 0 1  Trouble concentrating 0 0 0 1 1  Moving slowly or fidgety/restless 0 0 0 0 1  Suicidal thoughts 0 0 0 0 0  PHQ-9 Score 3 6 0 6 9  Difficult doing work/chores Very difficult Somewhat difficult Not difficult at all Somewhat difficult Somewhat difficult    phq 9 is positive   Fall Risk: Fall Risk  03/23/2019 12/21/2018 11/11/2018 09/08/2018 08/05/2018  Falls in the past year? 0 0 0 0 0  Number falls in past yr: 0 0 - 0 -  Injury with Fall? 0 0 - 0 -     Functional Status Survey: Is the patient deaf or have difficulty hearing?: No Does the patient have difficulty seeing, even when wearing glasses/contacts?: No Does the patient have difficulty concentrating, remembering, or making decisions?: No Does the patient have difficulty walking or climbing stairs?: No Does the patient have difficulty dressing or bathing?: No Does the patient have difficulty doing errands alone such as visiting a  doctor's office or shopping?: No    Assessment & Plan  1. Mood disorder (HCC)  - CBC with Differential/Platelet; Future - Comprehensive metabolic panel; Future  2. B12 deficiency  - Vitamin B12; Future  3. Fibromyalgia syndrome   4. Vitamin D deficiency  - VITAMIN D 25 Hydroxy (Vit-D Deficiency, Fractures); Future  5. Other fatigue  - Thyroid Panel With TSH; Future - Sedimentation rate; Future - C-reactive protein; Future - Epstein-Barr virus VCA antibody panel; Future - ANA Direct w/Reflex if Positive; Future  6. Arthralgia of both hands  - Sedimentation rate; Future - C-reactive protein; Future - ANA Direct w/Reflex if Positive; Future  7. Chronic constipation   8. Dyslipidemia  - Lipid panel; Future  9. Diabetes mellitus screening  - Hemoglobin A1c; Future  10. URI, acute  Discussed checking for COVID-19 , social isolation and excuse for work

## 2019-03-24 LAB — EPSTEIN-BARR VIRUS (EBV) ANTIBODY PROFILE
EBV NA IgG: <18 U/mL (ref 0.0–17.9)
EBV VCA IgG: >600 U/mL — ABNORMAL HIGH (ref 0.0–17.9)
EBV VCA IgM: <36 U/mL (ref 0.0–35.9)

## 2019-03-24 LAB — THYROID PANEL WITH TSH
Free Thyroxine Index: 2.2 (ref 1.2–4.9)
T3 Uptake Ratio: 27 % (ref 24–39)
T4, Total: 8 ug/dL (ref 4.5–12.0)
TSH: 2.03 u[IU]/mL (ref 0.450–4.500)

## 2019-03-24 LAB — ANA W/REFLEX IF POSITIVE: Anti Nuclear Antibody (ANA): NEGATIVE

## 2019-03-25 LAB — NOVEL CORONAVIRUS, NAA: SARS-CoV-2, NAA: NOT DETECTED

## 2019-03-27 ENCOUNTER — Encounter: Payer: Self-pay | Admitting: Family Medicine

## 2019-04-10 ENCOUNTER — Ambulatory Visit: Payer: BC Managed Care – PPO | Admitting: Obstetrics and Gynecology

## 2019-04-27 ENCOUNTER — Encounter: Payer: Self-pay | Admitting: Family Medicine

## 2019-04-27 ENCOUNTER — Ambulatory Visit (INDEPENDENT_AMBULATORY_CARE_PROVIDER_SITE_OTHER): Payer: BC Managed Care – PPO | Admitting: Family Medicine

## 2019-04-27 ENCOUNTER — Other Ambulatory Visit: Payer: Self-pay

## 2019-04-27 VITALS — BP 110/80 | HR 83 | Temp 97.5°F | Resp 16 | Ht 63.0 in | Wt 181.3 lb

## 2019-04-27 DIAGNOSIS — M79671 Pain in right foot: Secondary | ICD-10-CM | POA: Diagnosis not present

## 2019-04-27 DIAGNOSIS — F39 Unspecified mood [affective] disorder: Secondary | ICD-10-CM | POA: Diagnosis not present

## 2019-04-27 DIAGNOSIS — M797 Fibromyalgia: Secondary | ICD-10-CM

## 2019-04-27 DIAGNOSIS — M79672 Pain in left foot: Secondary | ICD-10-CM | POA: Diagnosis not present

## 2019-04-27 MED ORDER — MELOXICAM 15 MG PO TABS: 15.0000 mg | ORAL_TABLET | Freq: Every day | ORAL | 0 refills | Status: DC

## 2019-04-27 MED ORDER — CARIPRAZINE HCL 1.5 MG PO CAPS: 1.5000 mg | ORAL_CAPSULE | Freq: Every day | ORAL | 1 refills | Status: DC

## 2019-04-27 NOTE — Progress Notes (Signed)
Name: Deborah Navarro   MRN: [id number]    DOB: Nov 07, 1980   Date:04/27/2019       Progress Note  Subjective  Chief Complaint  Chief Complaint  Patient presents with  . Mood disorder  . Depression  . Foot Pain    She has been having foot pain x 1 month. Pain is constant, dull, achy and sharp when she puts pressure on it.    HPI  Feet Pain: she states pain has been going on for over one month, she states started with pain on forefoot, goes from toes to ball of her feet, currently worse on left foot. She states burning like, sometimes aching, sometimes it gets red at the end of the day, she also has intermittent swelling that is worse in the morning. It is affecting her gait, pain worse at the end of the day. She states worse when flexing her toes. She has elevated sed rate   FMS: she is on Lyrica, she states she still has pain, she has pain on her arms, neck , affecting her sleep. Pain right now is 1/10 but can get worse   Mood Disorder: she states anxiety has improved, but is feeling down now, lack of energy and motivation. Still able to study and keep grades up but is more of a struggle. She states hard to explain but feeling more down. Insurance denied [employer], she has tried Abilify, Elavil  and Duloxetine but did not work well for her, we will give her samples and try a PA again    Patient Active Problem List   Diagnosis Date Noted  . Mood disorder (HCC) 08/05/2018  . Papanicolaou smear of cervix with positive high risk human papilloma virus (HPV) test 05/04/2018  . False positive ana 04/28/2018  . Pain in both feet 04/28/2018  . Facial rash 04/07/2018  . Chronic fatigue 04/07/2018  . Recurrent abdominal pain 10/12/2017  . Chronic neck pain 03/27/2016  . Myalgia 03/27/2016  . Left-sided headache 03/27/2016  . Family history of early CAD 03/15/2015  . History of colon polyps 06/30/2010  . History of anemia 06/30/2010  . Depression, major, in remission (HCC) 06/30/2010  .  Endometriosis 06/30/2010  . CARDIAC MURMUR 06/30/2010  . History of chicken pox 06/30/2010    Past Surgical History:  Procedure Laterality Date  . COLONOSCOPY WITH PROPOFOL N/A 05/16/2018   Procedure: COLONOSCOPY WITH PROPOFOL;  Surgeon: Midge Minium, MD;  Location[facility];  Service: Endoscopy;  Laterality: N/A;  . DILATION AND CURETTAGE OF UTERUS    . LAPAROSCOPIC ENDOMETRIOSIS FULGURATION  10-1999   endometriosis  . NECK SURGERY N/A 06/14/15   due to C5/6 C6-7 disc herniation   . open heart surgery  12-1994   asd repair    Family History  Problem Relation Age of Onset  . Cancer Mother 34       non hodgkins lymphoma(mets to brain)  . Coronary artery disease Father   . Hypertension Father   . Hyperlipidemia Father   . Diabetes Father   . Diabetes Maternal Grandmother   . Heart attack Maternal Grandfather   . Other Maternal Grandfather        Deterioration of Jaw  . Stroke Paternal Grandmother   . Stroke Paternal Grandfather     Social History   Socioeconomic History  . Marital status: Married    Spouse name: Asher Muir  . Number of children: 1  . Years of education: Not on file  . Highest education  level: Not on file  Occupational History  . Occupation: Lobbyist: [employer]  . Financial resource strain: Not hard at all  . Food insecurity    Worry: Never true    Inability: Never true  . Transportation needs    Medical: No    Non-medical: No  Tobacco Use  . Smoking status: Never Smoker  . Smokeless tobacco: Never Used  Substance and Sexual Activity  . Alcohol use: Not Currently    Alcohol/week: 1.0 standard drinks    Types: 1 Shots of liquor per week    Comment: very occasionally (couple x/yr)  . Drug use: No  . Sexual activity: Yes    Partners: Male  Lifestyle  . Physical activity    Days per week: 0 days    Minutes per session: 0 min  . Stress: Not at all  Relationships  . Social Product manager on phone: Three times a week    Gets together: Three times a week    Attends religious service: Never    Active member of club or organization: Yes    Attends meetings of clubs or organizations: More than 4 times per year    Relationship status: Married  . Intimate partner violence    Fear of current or ex partner: No    Emotionally abused: No    Physically abused: No    Forced sexual activity: No  Other Topics Concern  . Not on file  Social History Narrative   Working 3 jobs currently     Current Outpatient Medications:  .  Cyanocobalamin (B-12) 1000 MCG SUBL, Place 1 tablet under the tongue daily., Disp: 30 tablet, Rfl: 0 .  pregabalin (LYRICA) 150 MG capsule, Take 1 capsule (150 mg total) by mouth 3 (three) times daily., Disp: 270 capsule, Rfl: 1  No Known Allergies  I personally reviewed active problem list, medication list, allergies, family history, social history, health maintenance with the patient/caregiver today.   ROS  Constitutional: Negative for fever or weight change.  Respiratory: Negative for cough and shortness of breath.   Cardiovascular: Negative for chest pain or palpitations.  Gastrointestinal: Negative for abdominal pain, no bowel changes.  Musculoskeletal: Positive  for gait problem and intermittent  joint swelling.  Skin: Negative for rash.  Neurological: Negative for dizziness or headache.  No other specific complaints in a complete review of systems (except as listed in HPI above).  Objective  Vitals:   04/27/19 0806  BP: 110/80  Pulse: 83  Resp: 16  Temp: (!) 97.5 F (36.4 C)  TempSrc: Temporal  SpO2: 98%  Weight: 181 lb 4.8 oz (82.2 kg)  Height: 5\' 3"  (1.6 m)    Body mass index is 32.12 kg/m.  Physical Exam  Constitutional: Patient appears well-developed and well-nourished. Obese  No distress.  HEENT: head atraumatic, normocephalic, pupils equal and reactive to light Cardiovascular: Normal rate, regular rhythm and normal  heart sounds.  No murmur heard. No BLE edema. Pulmonary/Chest: Effort normal and breath sounds normal. No respiratory distress. Abdominal: Soft.  There is no tenderness. Muscular Skeletal: tender during palpation of mid foot, no swelling or erythema - seen by podiatrist in the past and had injections without much help  Psychiatric: Patient has a normal mood and affect. behavior is normal. Judgment and thought content normal.  Recent Results (from the past 2160 hour(s))  Novel Coronavirus, NAA ([facility])     Status: None  Collection Time: 03/23/19 12:00 AM   Specimen: Nasopharyngeal(NP) swabs in vial transport medium   NASOPHARYNGE  TESTING  Result Value Ref Range   SARS-CoV-2, NAA Not Detected Not Detected    Comment: This nucleic acid amplification test was developed and its performance characteristics determined by [employer]. Nucleic acid amplification tests include PCR and TMA. This test has not been FDA cleared or approved. This test has been authorized by FDA under an Emergency Use Authorization (EUA). This test is only authorized for the duration of time the declaration that circumstances exist justifying the authorization of the emergency use of in vitro diagnostic tests for detection of SARS-CoV-2 virus and/or diagnosis of COVID-19 infection under section 564(b)(1) of the Act, 21 U.S.C. 161WRU-0(A) (1), unless the authorization is terminated or revoked sooner. When diagnostic testing is negative, the possibility of a false negative result should be considered in the context of a patient's recent exposures and the presence of clinical signs and symptoms consistent with COVID-19. An individual without symptoms of COVID-19 and who is not shedding SARS-CoV-2 virus would  expect to have a negative (not detected) result in this assay.   ANA Direct w/Reflex if Positive     Status: None   Collection Time: 03/23/19  9:18 AM  Result Value Ref Range   Anti Nuclear Antibody  (ANA) Negative Negative    Comment: (NOTE) Performed At: [facility] [address] [place], [place] [id number] Jolene Schimke MD YN:[id number]   Comprehensive metabolic panel     Status: None   Collection Time: 03/23/19  9:18 AM  Result Value Ref Range   Sodium 138 135 - 145 mmol/L   Potassium 4.3 3.5 - 5.1 mmol/L   Chloride 105 98 - 111 mmol/L   CO2 26 22 - 32 mmol/L   Glucose, Bld 89 70 - 99 mg/dL   BUN 11 6 - 20 mg/dL   Creatinine, Ser 6.57 0.44 - 1.00 mg/dL   Calcium 9.0 8.9 - 84.6 mg/dL   Total Protein 7.6 6.5 - 8.1 g/dL   Albumin 4.1 3.5 - 5.0 g/dL   AST 15 15 - 41 U/L   ALT 12 0 - 44 U/L   Alkaline Phosphatase 59 38 - 126 U/L   Total Bilirubin 0.4 0.3 - 1.2 mg/dL   GFR calc non Af Amer >60 >60 mL/min   GFR calc Af Amer >60 >60 mL/min   Anion gap 7 5 - 15    Comment: Performed at [facility], [address] [place], [place] [address]  C-reactive protein     Status: None   Collection Time: 03/23/19  9:18 AM  Result Value Ref Range   CRP <0.8 <1.0 mg/dL    Comment: Performed at [facility], [address], [place], [place] [address]  Sedimentation rate     Status: Abnormal   Collection Time: 03/23/19  9:18 AM  Result Value Ref Range   Sed Rate 30 (H) 0 - 20 mm/hr    Comment: Performed at [facility], [address] [place], [place] [address]  Thyroid Panel With TSH     Status: None   Collection Time: 03/23/19  9:18 AM  Result Value Ref Range   TSH 2.030 0.450 - 4.500 uIU/mL   T4, Total 8.0 4.5 - 12.0 ug/dL   T3 Uptake Ratio 27 24 - 39 %   Free Thyroxine Index 2.2 1.2 - 4.9    Comment: (NOTE) Performed At: [facility] [address]  [address] [place], [place] [id number] Jolene Schimke MD WU:[id number]   Hemoglobin A1c     Status: None   Collection Time: 03/23/19  9:18 AM  Result Value Ref Range   Hgb A1c MFr Bld 5.4 4.8 - 5.6 %    Comment: (NOTE) Pre diabetes:          5.7%-6.4% Diabetes:              >6.4% Glycemic  control for   <7.0% adults with diabetes    Mean Plasma Glucose 108.28 mg/dL    Comment: Performed at [facility], [address], [place], [place] [address]  Vitamin B12     Status: None   Collection Time: 03/23/19  9:18 AM  Result Value Ref Range   Vitamin B-12 261 180 - 914 pg/mL    Comment: (NOTE) This assay is not validated for testing neonatal or myeloproliferative syndrome specimens for Vitamin B12 levels. Performed at [facility], [address], [place], [place] [address]   VITAMIN D 25 Hydroxy (Vit-D Deficiency, Fractures)     Status: Abnormal   Collection Time: 03/23/19  9:18 AM  Result Value Ref Range   Vit D, 25-Hydroxy 20.80 (L) 30 - 100 ng/mL    Comment: (NOTE) Vitamin D deficiency has been defined by the Institute of Medicine  and an Endocrine Society practice guideline as a level of serum 25-OH  vitamin D less than 20 ng/mL (1,2). The Endocrine Society went on to  further define vitamin D insufficiency as a level between 21 and 29  ng/mL (2). 1. IOM (Institute of Medicine). 2010. Dietary reference intakes for  calcium and D. [place]: [employer]. 2. Holick MF, Binkley [place], Bischoff-Ferrari HA, et al. Evaluation,  treatment, and prevention of vitamin D deficiency: an Endocrine  Society clinical practice guideline, JCEM. 2011 Jul; 96(7): 1911-30. Performed at [facility], [address] [place], [place] [address]   CBC with Differential/Platelet     Status: None   Collection Time: 03/23/19  9:18 AM  Result Value Ref Range   WBC 5.4 4.0 - 10.5 K/uL   RBC 4.09 3.87 - 5.11 MIL/uL   Hemoglobin 12.1 12.0 - 15.0 g/dL   HCT 62.9 52.8 - 41.3 %   MCV 88.3 80.0 - 100.0 fL   MCH 29.6 26.0 - 34.0 pg   MCHC 33.5 30.0 - 36.0 g/dL   RDW 24.4 01.0 - 27.2 %   Platelets 243 150 - 400 K/uL   nRBC 0.0 0.0 - 0.2 %   Neutrophils Relative % 60 %   Neutro Abs 3.2 1.7 - 7.7 K/uL   Lymphocytes Relative 28 %   Lymphs Abs 1.5 0.7 - 4.0  K/uL   Monocytes Relative 10 %   Monocytes Absolute 0.5 0.1 - 1.0 K/uL   Eosinophils Relative 1 %   Eosinophils Absolute 0.1 0.0 - 0.5 K/uL   Basophils Relative 1 %   Basophils Absolute 0.1 0.0 - 0.1 K/uL   Immature Granulocytes 0 %   Abs Immature Granulocytes 0.02 0.00 - 0.07 K/uL    Comment: Performed at [facility], [address] [place], [place] [address]  Lipid panel     Status: Abnormal   Collection Time: 03/23/19  9:18 AM  Result Value Ref Range   Cholesterol 173 0 - 200 mg/dL   Triglycerides 52 <403 mg/dL   HDL 46 >47 mg/dL   Total CHOL/HDL Ratio 3.8 RATIO   VLDL  10 0 - 40 mg/dL   LDL Cholesterol 409117 (H) 0 - 99 mg/dL    Comment:        Total Cholesterol/HDL:CHD Risk Coronary Heart Disease Risk Table                     Men   Women  1/2 Average Risk   3.4   3.3  Average Risk       5.0   4.4  2 X Average Risk   9.6   7.1  3 X Average Risk  23.4   11.0        Use the calculated Patient Ratio above and the CHD Risk Table to determine the patient's CHD Risk.        ATP III CLASSIFICATION (LDL):  <100     mg/dL   Optimal  [id number]  mg/dL   Near or Above                    Optimal  130-159  mg/dL   Borderline  [id number]  mg/dL   High  >213>190     mg/dL   Very High Performed at [facility], [address] [place], [place] [id number]   EPSTEIN-BARR VIRUS (EBV) Antibody Profile     Status: Abnormal   Collection Time: 03/23/19  9:18 AM  Result Value Ref Range   EBV VCA IgM <36.0 0.0 - 35.9 U/mL    Comment: (NOTE)                                 Negative        <36.0                                 Equivocal 36.0 - 43.9                                 Positive        >43.9    EBV VCA IgG >600.0 (H) 0.0 - 17.9 U/mL    Comment: (NOTE)                                 Negative        <18.0                                 Equivocal 18.0 - 21.9                                 Positive        >21.9    EBV NA IgG <18.0 0.0 - 17.9 U/mL    Comment:  (NOTE)                                 Negative        <18.0                                 Equivocal 18.0 -  21.9                                 Positive        >21.9    Interpretation: Comment     Comment: (NOTE)               EBV Interpretation Chart Key: Antibody Present +    Antibody Absent - Interpretation             VCA-IgM   VCA-IgG  EBNA-IgG No previous infection/        -         -         - Susceptible Primary infection (new        +         +         - or recent) Past Infection               +or-       +         + See comment below*            +         -         - *Results indicate infection with EBV at some time however cannot predict the timing of the infection since antibodies to EBNA usually develop after primary infection or, alternatively, approximately 5-10% of patients with EBV never develop antibodies to EBNA. Performed At: [facility] [address] [place], [place] [id number] Jolene Schimke MD LN:[id number]      PHQ2/9: Depression screen [facility] 2/9 04/27/2019 04/27/2019 03/23/2019 12/21/2018 11/11/2018  Decreased Interest 1 0 0 1 0  Down, Depressed, Hopeless 0 0 0 0 0  PHQ - 2 Score 1 0 0 1 0  Altered sleeping 3 0 0 2 0  Tired, decreased energy 3 0 3 1 0  Change in appetite 3 0 0 2 0  Feeling bad or failure about yourself  0 0 0 0 0  Trouble concentrating 1 0 0 0 0  Moving slowly or fidgety/restless 0 0 0 0 0  Suicidal thoughts 0 0 0 0 0  PHQ-9 Score 11 0 3 6 0  Difficult doing work/chores - - Very difficult Somewhat difficult Not difficult at all  Some recent data might be hidden    phq 9 is positive   Fall Risk: Fall Risk  04/27/2019 03/23/2019 12/21/2018 11/11/2018 09/08/2018  Falls in the past year? 0 0 0 0 0  Number falls in past yr: 0 0 0 - 0  Injury with Fall? 0 0 0 - 0     Functional Status Survey: Is the patient deaf or have difficulty hearing?: No Does the patient have difficulty seeing, even when wearing glasses/contacts?:  Yes Does the patient have difficulty concentrating, remembering, or making decisions?: Yes Does the patient have difficulty walking or climbing stairs?: Yes Does the patient have difficulty dressing or bathing?: No Does the patient have difficulty doing errands alone such as visiting a doctor's office or shopping?: No    Assessment & Plan  1. Pain in both feet  - meloxicam (MOBIC) 15 MG tablet; Take 1 tablet (15 mg total) by mouth daily.  Dispense: 30 tablet; Refill: 0  2. Mood disorder (HCC)  - cariprazine (VRAYLAR) capsule; Take 1 capsule (1.5 mg total) by mouth daily.  Dispense: 30 capsule; Refill: 1  3. Fibromyalgia syndrome  Continue lyrica

## 2019-05-03 ENCOUNTER — Ambulatory Visit (INDEPENDENT_AMBULATORY_CARE_PROVIDER_SITE_OTHER): Payer: BC Managed Care – PPO | Admitting: Obstetrics and Gynecology

## 2019-05-03 ENCOUNTER — Other Ambulatory Visit (HOSPITAL_COMMUNITY)
Admission: RE | Admit: 2019-05-03 | Discharge: 2019-05-03 | Disposition: A | Discharge status: Home / Self Care | Payer: BC Managed Care – PPO | Source: Ambulatory Visit | Attending: Obstetrics and Gynecology | Admitting: Obstetrics and Gynecology

## 2019-05-03 ENCOUNTER — Other Ambulatory Visit: Payer: Self-pay

## 2019-05-03 ENCOUNTER — Encounter: Payer: Self-pay | Admitting: Obstetrics and Gynecology

## 2019-05-03 VITALS — BP 132/82 | Ht 63.5 in | Wt 188.0 lb

## 2019-05-03 DIAGNOSIS — Z1339 Encounter for screening examination for other mental health and behavioral disorders: Secondary | ICD-10-CM

## 2019-05-03 DIAGNOSIS — R87810 Cervical high risk human papillomavirus (HPV) DNA test positive: Secondary | ICD-10-CM | POA: Diagnosis not present

## 2019-05-03 DIAGNOSIS — Z01419 Encounter for gynecological examination (general) (routine) without abnormal findings: Secondary | ICD-10-CM | POA: Insufficient documentation

## 2019-05-03 DIAGNOSIS — Z1331 Encounter for screening for depression: Secondary | ICD-10-CM

## 2019-05-03 NOTE — Progress Notes (Signed)
Gynecology Annual Exam  PCP: Alba CorySowles, Krichna, MD  Chief Complaint  Patient presents with  . Gynecologic Exam    Pain with intercourse   History of Present Illness:  Ms. Deborah Navarro is a 38 y.o. G2P1011 who LMP was Patient's last menstrual period was 04/18/2019 (exact date)., presents today for her annual examination.  Her menses are regular every 28-30 days, lasting 5 day(s).  Dysmenorrhea moderate, occurring first 1-2 days of flow. She does not have intermenstrual bleeding.  She feels bad leading up to her period for the 2 weeks leading up.  She has noticed this for the past 4-6 months.    She is sexually active. Sex is painful with intercourse. Even tampons are uncomfortable for her.   She only has intercourse about twice per year. We did not discuss this in further detail, as this is not a big change for her. Last Pap: 1 year  Results were: no abnormalities /neg HPV DNA positive (HPV 16/18 negative) Hx of STDs: none  There is no FH of breast cancer. There is no FH of ovarian cancer. The patient does not do self-breast exams.  Tobacco use: The patient denies current or previous tobacco use. Alcohol use: social drinker Exercise: just work, on her feet all day long.   The patient wears seatbelts: yes.   The patient reports that domestic violence in her life is absent.   Past Medical History:  Diagnosis Date  . Anemia, unspecified   . ASD (atrial septal defect)    repaired 1996  . Blood transfusion, without reported diagnosis   . Depressive disorder, not elsewhere classified   . Elevated blood pressure reading without diagnosis of hypertension   . Endometriosis of fallopian tube   . Family history of adverse reaction to anesthesia    Father - PONV  . Irritable bowel syndrome   . PONV (postoperative nausea and vomiting)   . Streptococcal meningitis   . Undiagnosed cardiac murmurs   . Unspecified personal history presenting hazards to health   . Wears contact lenses      Past Surgical History:  Procedure Laterality Date  . COLONOSCOPY WITH PROPOFOL N/A 05/16/2018   Procedure: COLONOSCOPY WITH PROPOFOL;  Surgeon: Midge MiniumWohl, Darren, MD;  [facility];  Service: Endoscopy;  Laterality: N/A;  . DILATION AND CURETTAGE OF UTERUS    . LAPAROSCOPIC ENDOMETRIOSIS FULGURATION  10-1999   endometriosis  . NECK SURGERY N/A 06/14/15   due to C5/6 C6-7 disc herniation   . open heart surgery  12-1994   asd repair    Prior to Admission medications   Medication Sig Start Date End Date Taking? Authorizing Provider  cariprazine (VRAYLAR) capsule Take 1 capsule (1.5 mg total) by mouth daily. 04/27/19  Yes Sowles, Danna HeftyKrichna, MD  Cyanocobalamin (B-12) 1000 MCG SUBL Place 1 tablet under the tongue daily. 12/21/18  Yes Sowles, Danna HeftyKrichna, MD  meloxicam (MOBIC) 15 MG tablet Take 1 tablet (15 mg total) by mouth daily. 04/27/19  Yes Sowles, Danna HeftyKrichna, MD  pregabalin (LYRICA) 150 MG capsule Take 1 capsule (150 mg total) by mouth 3 (three) times daily. 12/21/18  Yes Sowles, Danna HeftyKrichna, MD  Vitamin D, Ergocalciferol, (DRISDOL) 1.25 MG (50000 UT) CAPS capsule Take 50,000 Units by mouth every 7 (seven) days.   Yes [provider]   Allergies: No Known Allergies  Obstetric History: G2P1011  Social History   Socioeconomic History  . Marital status: Married    Spouse name: Deborah Navarro  . Number of children: 1  .  Years of education: Not on file  . Highest education level: Not on file  Occupational History  . Occupation: Engineering geologist: [employer]  Social Needs  . Financial resource strain: Not hard at all  . Food insecurity    Worry: Never true    Inability: Never true  . Transportation needs    Medical: No    Non-medical: No  Tobacco Use  . Smoking status: Never Smoker  . Smokeless tobacco: Never Used  Substance and Sexual Activity  . Alcohol use: Not Currently    Alcohol/week: 1.0 standard drinks    Types: 1 Shots of liquor per week     Comment: very occasionally (couple x/yr)  . Drug use: No  . Sexual activity: Yes    Partners: Male    Birth control/protection: None  Lifestyle  . Physical activity    Days per week: 0 days    Minutes per session: 0 min  . Stress: Not at all  Relationships  . Social Musician on phone: Three times a week    Gets together: Three times a week    Attends religious service: Never    Active member of club or organization: Yes    Attends meetings of clubs or organizations: More than 4 times per year    Relationship status: Married  . Intimate partner violence    Fear of current or ex partner: No    Emotionally abused: No    Physically abused: No    Forced sexual activity: No  Other Topics Concern  . Not on file  Social History Narrative   Working 3 jobs currently    Family History  Problem Relation Age of Onset  . Cancer Mother 49       non hodgkins lymphoma(mets to brain)  . Coronary artery disease Father   . Hypertension Father   . Hyperlipidemia Father   . Diabetes Father   . Diabetes Maternal Grandmother   . Heart attack Maternal Grandfather   . Other Maternal Grandfather        Deterioration of Jaw  . Stroke Paternal Grandmother   . Stroke Paternal Grandfather     Review of Systems  Constitutional: Negative.   HENT: Negative.   Eyes: Negative.   Respiratory: Negative.   Cardiovascular: Negative.   Gastrointestinal: Negative.   Genitourinary: Negative.   Musculoskeletal: Negative.   Skin: Negative.   Neurological: Negative.   Psychiatric/Behavioral: Negative.      Physical Exam BP 132/82   Ht 5' 3.5" (1.613 m)   Wt 188 lb (85.3 kg)   LMP 04/18/2019 (Exact Date)   BMI 32.78 kg/m    Physical Exam Constitutional:      General: She is not in acute distress.    Appearance: Normal appearance. She is well-developed.  Genitourinary:     Pelvic exam was performed with patient supine.     Vulva, urethra, bladder and uterus normal.     No  inguinal adenopathy present in the right or left side.    No signs of injury in the vagina.     No vaginal discharge, erythema, tenderness or bleeding.     No cervical motion tenderness, discharge, lesion or polyp.     Uterus is mobile.     Uterus is not enlarged or tender.     No uterine mass detected.    Uterus is anteverted.     No right or left adnexal mass  present.     Right adnexa not tender or full.     Left adnexa not tender or full.  HENT:     Head: Normocephalic and atraumatic.  Eyes:     General: No scleral icterus.    Conjunctiva/sclera: Conjunctivae normal.  Neck:     Musculoskeletal: Normal range of motion and neck supple.     Thyroid: No thyromegaly.  Cardiovascular:     Rate and Rhythm: Normal rate and regular rhythm.     Heart sounds: No murmur. No friction rub. No gallop.   Pulmonary:     Effort: Pulmonary effort is normal. No respiratory distress.     Breath sounds: Normal breath sounds. No wheezing or rales.  Chest:     Breasts:        Right: No inverted nipple, mass, nipple discharge, skin change or tenderness.        Left: No inverted nipple, mass, nipple discharge, skin change or tenderness.  Abdominal:     General: Bowel sounds are normal. There is no distension.     Palpations: Abdomen is soft. There is no mass.     Tenderness: There is no abdominal tenderness. There is no guarding or rebound.  Musculoskeletal: Normal range of motion.        General: No swelling or tenderness.  Lymphadenopathy:     Cervical: No cervical adenopathy.     Lower Body: No right inguinal adenopathy. No left inguinal adenopathy.  Neurological:     General: No focal deficit present.     Mental Status: She is alert and oriented to person, place, and time.     Cranial Nerves: No cranial nerve deficit.  Skin:    General: Skin is warm and dry.     Findings: No erythema or rash.  Psychiatric:        Mood and Affect: Mood normal.        Behavior: Behavior normal.         Judgment: Judgment normal.     Female chaperone present for pelvic and breast  portions of the physical exam  Results: AUDIT Questionnaire (screen for alcoholism): 0 PHQ-9: 5   Assessment: 38 y.o. G35P0011 female here for routine annual gynecologic examination  Plan: Problem List Items Addressed This Visit    None    Visit Diagnoses    Women's annual routine gynecological examination    -  Primary   Relevant Orders   Cytology - PAP   Screening for depression       Screening for alcoholism       Pap smear of cervix shows high risk HPV present       Relevant Orders   Cytology - PAP      Screening: -- Blood pressure screen normal -- Weight screening: overweight: continue to monitor  -- Mammogram: Patient due. SHe understands it is her responsibility to call to schedule. She was provided with contact information to [facility].   -- Depression screening negative (PHQ-9) -- Nutrition: normal -- cholesterol screening: not due for screening -- osteoporosis screening: not due -- tobacco screening: not using -- alcohol screening: AUDIT questionnaire indicates low-risk usage. -- family history of breast cancer screening: done. not at high risk. -- no evidence of domestic violence or intimate partner violence. -- STD screening: gonorrhea/chlamydia NAAT not collected per patient request. -- pap smear collected per ASCCP guidelines -- flu vaccine received   Prentice Docker, MD 05/03/2019 2:24 PM

## 2019-05-03 NOTE — Patient Instructions (Signed)
[facility]  Mammography service in [place], [place]  Located in: [facility] Address: First Floor -  [facility], [address], [place], [place] [address]  Phone: [phone] 

## 2019-05-04 DIAGNOSIS — R87610 Atypical squamous cells of undetermined significance on cytologic smear of cervix (ASC-US): Secondary | ICD-10-CM | POA: Diagnosis not present

## 2019-05-08 ENCOUNTER — Telehealth: Payer: Self-pay | Admitting: Obstetrics and Gynecology

## 2019-05-08 LAB — CYTOLOGY - PAP
Adequacy: ABSENT
Comment: NEGATIVE
Diagnosis: UNDETERMINED — AB
High risk HPV: POSITIVE — AB

## 2019-05-08 NOTE — Telephone Encounter (Signed)
Discussed ASCUS, HPV+ results with patient. I highly recommend colposcopy as next step in order to prevent cancer of the cervix.  She voiced understanding of this and will call to schedule a colposcopy for soon. All questions answered.

## 2019-05-08 NOTE — Telephone Encounter (Signed)
Patient is returning missed call from [facility]. Please advise patient aware your on Call out of the office

## 2019-05-08 NOTE — Telephone Encounter (Signed)
Left generic VM 

## 2019-05-16 ENCOUNTER — Encounter: Payer: Self-pay | Admitting: Family Medicine

## 2019-05-16 ENCOUNTER — Other Ambulatory Visit: Payer: Self-pay | Admitting: Family Medicine

## 2019-05-16 DIAGNOSIS — F99 Mental disorder, not otherwise specified: Secondary | ICD-10-CM

## 2019-05-16 MED ORDER — TRAZODONE HCL 50 MG PO TABS: 25.0000 mg | ORAL_TABLET | Freq: Every evening | ORAL | 0 refills | Status: DC | PRN

## 2019-05-22 ENCOUNTER — Other Ambulatory Visit: Payer: Self-pay | Admitting: Family Medicine

## 2019-05-22 DIAGNOSIS — M79671 Pain in right foot: Secondary | ICD-10-CM

## 2019-05-26 ENCOUNTER — Other Ambulatory Visit (HOSPITAL_COMMUNITY)
Admission: RE | Admit: 2019-05-26 | Discharge: 2019-05-26 | Disposition: A | Discharge status: Home / Self Care | Payer: BC Managed Care – PPO | Source: Ambulatory Visit | Attending: Obstetrics and Gynecology | Admitting: Obstetrics and Gynecology

## 2019-05-26 ENCOUNTER — Encounter: Payer: Self-pay | Admitting: Obstetrics and Gynecology

## 2019-05-26 ENCOUNTER — Ambulatory Visit (INDEPENDENT_AMBULATORY_CARE_PROVIDER_SITE_OTHER): Payer: BC Managed Care – PPO | Admitting: Obstetrics and Gynecology

## 2019-05-26 ENCOUNTER — Other Ambulatory Visit: Payer: Self-pay

## 2019-05-26 VITALS — BP 124/78 | Ht 63.0 in | Wt 184.0 lb

## 2019-05-26 DIAGNOSIS — R87810 Cervical high risk human papillomavirus (HPV) DNA test positive: Secondary | ICD-10-CM | POA: Diagnosis not present

## 2019-05-26 DIAGNOSIS — R87610 Atypical squamous cells of undetermined significance on cytologic smear of cervix (ASC-US): Secondary | ICD-10-CM

## 2019-05-26 DIAGNOSIS — N870 Mild cervical dysplasia: Secondary | ICD-10-CM | POA: Diagnosis not present

## 2019-05-26 NOTE — Progress Notes (Signed)
HPI:  Deborah Navarro is a 38 y.o.  [id number]  who presents today for evaluation and management of abnormal cervical cytology.    Dysplasia History:   03/2018: NILM, HPV + 04/2019: ASCUS, HPV+  OB History  Gravida Para Term Preterm AB Living  2       1 1   SAB TAB Ectopic Multiple Live Births  1       1    # Outcome Date GA Lbr Len/2nd Weight Sex Delivery Anes PTL Lv  2 Gravida           1 SAB             Obstetric Comments  One child born 2002 had a miscarriage in 2009    Past Medical History:  Diagnosis Date  . Anemia, unspecified   . ASD (atrial septal defect)    repaired 1996  . Blood transfusion, without reported diagnosis   . Depressive disorder, not elsewhere classified   . Elevated blood pressure reading without diagnosis of hypertension   . Endometriosis of fallopian tube   . Family history of adverse reaction to anesthesia    Father - PONV  . Irritable bowel syndrome   . PONV (postoperative nausea and vomiting)   . Streptococcal meningitis   . Undiagnosed cardiac murmurs   . Unspecified personal history presenting hazards to health   . Wears contact lenses     Past Surgical History:  Procedure Laterality Date  . COLONOSCOPY WITH PROPOFOL N/A 05/16/2018   Procedure: COLONOSCOPY WITH PROPOFOL;  Surgeon: 14/07/2017, MD;  Location[facility];  Service: Endoscopy;  Laterality: N/A;  . DILATION AND CURETTAGE OF UTERUS    . LAPAROSCOPIC ENDOMETRIOSIS FULGURATION  10-1999   endometriosis  . NECK SURGERY N/A 06/14/15   due to C5/6 C6-7 disc herniation   . open heart surgery  12-1994   asd repair    SOCIAL HISTORY:  Social History   Substance and Sexual Activity  Alcohol Use Not Currently  . Alcohol/week: 1.0 standard drinks  . Types: 1 Shots of liquor per week   Comment: very occasionally (couple x/yr)    Social History   Substance and Sexual Activity  Drug Use No     Family History  Problem Relation Age of Onset  . Cancer  Mother 42       non hodgkins lymphoma(mets to brain)  . Coronary artery disease Father   . Hypertension Father   . Hyperlipidemia Father   . Diabetes Father   . Diabetes Maternal Grandmother   . Heart attack Maternal Grandfather   . Other Maternal Grandfather        Deterioration of Jaw  . Stroke Paternal Grandmother   . Stroke Paternal Grandfather     ALLERGIES:  Patient has no known allergies.  Current Outpatient Medications on File Prior to Visit  Medication Sig Dispense Refill  . cariprazine (VRAYLAR) capsule Take 1 capsule (1.5 mg total) by mouth daily. 30 capsule 1  . Cyanocobalamin (B-12) 1000 MCG SUBL Place 1 tablet under the tongue daily. 30 tablet 0  . meloxicam (MOBIC) 15 MG tablet TAKE 1 TABLET(15 MG) BY MOUTH DAILY 30 tablet 2  . pregabalin (LYRICA) 150 MG capsule Take 1 capsule (150 mg total) by mouth 3 (three) times daily. 270 capsule 1  . traZODone (DESYREL) 50 MG tablet Take 0.5-1 tablets (25-50 mg total) by mouth at bedtime as needed for sleep. 30 tablet 0  . Vitamin D, Ergocalciferol, (  DRISDOL) 1.25 MG (50000 UT) CAPS capsule Take 50,000 Units by mouth every 7 (seven) days.     No current facility-administered medications on file prior to visit.    Physical Exam: -Vitals:  BP 124/78   Ht 5\' 3"  (1.6 m)   Wt 184 lb (83.5 kg)   BMI 32.59 kg/m  GEN: WD, WN, NAD.  A+ O x 3, good mood and affect. ABD:  NT, ND.  Soft, no masses.  No hernias noted.   Pelvic:   Vulva: Normal appearance.  No lesions.  Vagina: No lesions or abnormalities noted.  Support: Normal pelvic support.  Urethra No masses tenderness or scarring.  Meatus Normal size without lesions or prolapse.  Cervix: See below.  Anus: Normal exam.  No lesions.  Perineum: Normal exam.  No lesions.        Bimanual   Uterus: Normal size.  Non-tender.  Mobile.  AV.  Adnexae: No masses.  Non-tender to palpation.  Cul-de-sac: Negative for abnormality.   PROCEDURE: 1.  Urine Pregnancy Test:  negative 2.   Colposcopy performed with 4% acetic acid after verbal consent obtained                                         -Aceto-white Lesions Location(s): more diffusely, but mild at 6-12 o'clock.              -Biopsy performed at 9 o'clock (Due to cervical stenosis, unable to take more biopsies and definitely get endocervical tissue)              -ECC indicated and performed: Yes.       -Biopsy sites made hemostatic with pressure, AgNO3, and/or Monsel's solution   -Satisfactory colposcopy: No.    -Evidence of Invasive cervical CA :  NO  ASSESSMENT:  Deborah Navarro is a 38 y.o. [id number] here for  1. Atypical squamous cell changes of undetermined significance (ASCUS) on cervical cytology with positive high risk human papilloma virus (HPV)   .  PLAN: 1.  I discussed the grading system of pap smears and HPV high risk viral types.  We will discuss and base management after colpo results return.       Prentice Docker, MD  [facility] 05/26/2019  3:05 PM

## 2019-05-31 LAB — SURGICAL PATHOLOGY

## 2019-06-05 ENCOUNTER — Telehealth: Payer: Self-pay | Admitting: Obstetrics and Gynecology

## 2019-06-05 NOTE — Telephone Encounter (Signed)
Discussed results of CIN 1.  Recommended repeat pap smear in 1 year.  Discussed importance of follow up to prevent cervical cancer. Patient voiced understanding and agreement with the plan.

## 2019-06-21 ENCOUNTER — Encounter: Payer: Self-pay | Admitting: Family Medicine

## 2019-06-27 ENCOUNTER — Other Ambulatory Visit: Payer: Self-pay

## 2019-06-27 ENCOUNTER — Encounter: Payer: Self-pay | Admitting: Family Medicine

## 2019-06-27 ENCOUNTER — Ambulatory Visit (INDEPENDENT_AMBULATORY_CARE_PROVIDER_SITE_OTHER): Payer: BC Managed Care – PPO | Admitting: Family Medicine

## 2019-06-27 VITALS — BP 110/80 | HR 80 | Temp 97.5°F | Resp 16 | Ht 63.0 in | Wt 189.1 lb

## 2019-06-27 DIAGNOSIS — E559 Vitamin D deficiency, unspecified: Secondary | ICD-10-CM

## 2019-06-27 DIAGNOSIS — E538 Deficiency of other specified B group vitamins: Secondary | ICD-10-CM | POA: Diagnosis not present

## 2019-06-27 DIAGNOSIS — M25541 Pain in joints of right hand: Secondary | ICD-10-CM

## 2019-06-27 DIAGNOSIS — F39 Unspecified mood [affective] disorder: Secondary | ICD-10-CM

## 2019-06-27 DIAGNOSIS — R5383 Other fatigue: Secondary | ICD-10-CM

## 2019-06-27 DIAGNOSIS — F99 Mental disorder, not otherwise specified: Secondary | ICD-10-CM

## 2019-06-27 DIAGNOSIS — M792 Neuralgia and neuritis, unspecified: Secondary | ICD-10-CM

## 2019-06-27 DIAGNOSIS — F5105 Insomnia due to other mental disorder: Secondary | ICD-10-CM

## 2019-06-27 DIAGNOSIS — M797 Fibromyalgia: Secondary | ICD-10-CM

## 2019-06-27 DIAGNOSIS — M25542 Pain in joints of left hand: Secondary | ICD-10-CM

## 2019-06-27 MED ORDER — CARIPRAZINE HCL 1.5 MG PO CAPS: 1.5000 mg | ORAL_CAPSULE | Freq: Every day | ORAL | 2 refills | Status: DC

## 2019-06-27 MED ORDER — PREGABALIN 200 MG PO CAPS: 200.0000 mg | ORAL_CAPSULE | Freq: Three times a day (TID) | ORAL | 2 refills | Status: DC

## 2019-06-27 MED ORDER — VITAMIN D (ERGOCALCIFEROL) 1.25 MG (50000 UNIT) PO CAPS: 50000.0000 [IU] | ORAL_CAPSULE | ORAL | 1 refills | Status: DC

## 2019-06-27 MED ORDER — CYANOCOBALAMIN 1000 MCG/ML IJ SOLN
1000.0000 ug | Freq: Once | INTRAMUSCULAR | Status: AC
  Administered 2019-06-27: 1000 ug via INTRAMUSCULAR

## 2019-06-27 NOTE — Progress Notes (Signed)
Name: Deborah Navarro   MRN: [id number]    DOB: May 30, 1981   Date:06/27/2019       Progress Note  Subjective  Chief Complaint  Chief Complaint  Patient presents with  . Follow-up    2 month F/U  . Pain in both feet  . Mood disorder ([place])  . Fibromyalgia syndrome  . Neck Pain    Onset-1 year and progressively getting worst with looking at her computer a Ipad all the time w/ school work and online shopping at work    HPI  Feet Pain: she states pain has been going on for a few months,  she states started with pain on forefoot, goes from toes to ball of her feet, currently worse on left foot. She states burning like, sometimes aching, sometimes it gets red at the end of the day, she also has intermittent swelling that is worse in the morning. She states gait is better  Neck pain: she states she looks down at her phone to complete orders at her job and is causing left nuchal pain, radiates to her head triggering a headache, no arm weakness or pain shooting down to her arm. Trying to get massage therapy . Discussed chiropractor care   FMS: she is on Lyrica, she states she still has pain, but mostly from neuropathy on her calves and worse during the day, takes medication in am, and two at night, advised to take TID and we will increase dose to 200 mg TID . She states that meloxicam has helped. She has history of elevated ANA , sed rate and CRP but last levels were normal except for sed rate was still 30  Mood Disorder: . Insurance denied Dietitian initially , she has tried Abilify, Elavil  and Duloxetine but did not work well for her. She is back on Vraylar 1.5 mg and states feels tired during the day, falls asleep at 8 pm and wakes up between 3-5 am and still feels tired when she wakes up. She states improved once she started taking medication at dinner instead of in the morning, it really helps with her mood and she prefers not changing medication at this time. She states next week she will  be home after 7:30 pm and she will see if sleep pattern improves. She stopped trazodone since she was able to fall asleep.   Fatigue: she had two levels of B12 below 300 and we will start B12 shots today, she has been taking sublingual but it did not make it go up enough. Changing to rx vitamin D since last time vitamin D level was still low   CIN I: seeing Dr. Glennon Mac will repeat pap smear in one year   Patient Active Problem List   Diagnosis Date Noted  . Mood disorder ([place]) 08/05/2018  . Papanicolaou smear of cervix with positive high risk human papilloma virus (HPV) test 05/04/2018  . False positive ana 04/28/2018  . Pain in both feet 04/28/2018  . Facial rash 04/07/2018  . Chronic fatigue 04/07/2018  . Recurrent abdominal pain 10/12/2017  . Chronic neck pain 03/27/2016  . Myalgia 03/27/2016  . Left-sided headache 03/27/2016  . Family history of early CAD 03/15/2015  . History of colon polyps 06/30/2010  . History of anemia 06/30/2010  . Depression, major, in remission ([place]) 06/30/2010  . Endometriosis 06/30/2010  . CARDIAC MURMUR 06/30/2010  . History of chicken pox 06/30/2010    Past Surgical History:  Procedure Laterality Date  . COLONOSCOPY WITH  PROPOFOL N/A 05/16/2018   Procedure: COLONOSCOPY WITH PROPOFOL;  Surgeon: Midge Minium, MD;  Location: [facility];  Service: Endoscopy;  Laterality: N/A;  . DILATION AND CURETTAGE OF UTERUS    . LAPAROSCOPIC ENDOMETRIOSIS FULGURATION  10-1999   endometriosis  . NECK SURGERY N/A 06/14/15   due to C5/6 C6-7 disc herniation   . open heart surgery  12-1994   asd repair    Family History  Problem Relation Age of Onset  . Cancer Mother 49       non hodgkins lymphoma(mets to brain)  . Coronary artery disease Father   . Hypertension Father   . Hyperlipidemia Father   . Diabetes Father   . Diabetes Maternal Grandmother   . Heart attack Maternal Grandfather   . Other Maternal Grandfather        Deterioration of Jaw  .  Stroke Paternal Grandmother   . Stroke Paternal Grandfather     Social History   Socioeconomic History  . Marital status: Married    Spouse name: Asher Muir  . Number of children: 1  . Years of education: Not on file  . Highest education level: Not on file  Occupational History  . Occupation: Engineering geologist: [employer]  Tobacco Use  . Smoking status: Never Smoker  . Smokeless tobacco: Never Used  Substance and Sexual Activity  . Alcohol use: Not Currently    Alcohol/week: 1.0 standard drinks    Types: 1 Shots of liquor per week    Comment: very occasionally (couple x/yr)  . Drug use: No  . Sexual activity: Yes    Partners: Male    Birth control/protection: None  Other Topics Concern  . Not on file  Social History Narrative   Working 3 jobs currently   Social Determinants of Corporate investment banker Strain:   . Difficulty of Paying Living Expenses: Not on file  Food Insecurity:   . Worried About Programme researcher, broadcasting/film/video in the Last Year: Not on file  . Ran Out of Food in the Last Year: Not on file  Transportation Needs:   . Lack of Transportation (Medical): Not on file  . Lack of Transportation (Non-Medical): Not on file  Physical Activity:   . Days of Exercise per Week: Not on file  . Minutes of Exercise per Session: Not on file  Stress:   . Feeling of Stress : Not on file  Social Connections:   . Frequency of Communication with Friends and Family: Not on file  . Frequency of Social Gatherings with Friends and Family: Not on file  . Attends Religious Services: Not on file  . Active Member of Clubs or Organizations: Not on file  . Attends Banker Meetings: Not on file  . Marital Status: Not on file  Intimate Partner Violence:   . Fear of Current or Ex-Partner: Not on file  . Emotionally Abused: Not on file  . Physically Abused: Not on file  . Sexually Abused: Not on file     Current Outpatient Medications:  .  cariprazine  (VRAYLAR) capsule, Take 1 capsule (1.5 mg total) by mouth daily., Disp: 30 capsule, Rfl: 1 .  Cyanocobalamin (B-12) 1000 MCG SUBL, Place 1 tablet under the tongue daily., Disp: 30 tablet, Rfl: 0 .  meloxicam (MOBIC) 15 MG tablet, TAKE 1 TABLET(15 MG) BY MOUTH DAILY, Disp: 30 tablet, Rfl: 2 .  pregabalin (LYRICA) 150 MG capsule, Take 1 capsule (150 mg  total) by mouth 3 (three) times daily., Disp: 270 capsule, Rfl: 1 .  traZODone (DESYREL) 50 MG tablet, Take 0.5-1 tablets (25-50 mg total) by mouth at bedtime as needed for sleep., Disp: 30 tablet, Rfl: 0 .  VITAMIN D PO, Take 2,000 Units by mouth daily., Disp: , Rfl:  .  Vitamin D, Ergocalciferol, (DRISDOL) 1.25 MG (50000 UT) CAPS capsule, Take 50,000 Units by mouth every 7 (seven) days., Disp: , Rfl:   No Known Allergies  I personally reviewed active problem list, medication list, allergies, family history, social history with the patient/caregiver today.   ROS  Constitutional: Negative for fever or weight change.  Respiratory: Negative for cough and shortness of breath.   Cardiovascular: Negative for chest pain or palpitations.  Gastrointestinal: Negative for abdominal pain, no bowel changes.  Musculoskeletal: Negative for gait problem or joint swelling.  Skin: Negative for rash.  Neurological: Negative for dizziness or headache.  No other specific complaints in a complete review of systems (except as listed in HPI above).  Objective  Vitals:   06/27/19 1404  Pulse: 80  Resp: 16  Temp: (!) 97.5 F (36.4 C)  TempSrc: Temporal  SpO2: 100%  Weight: 189 lb 1.6 oz (85.8 kg)  Height: 5\' 3"  (1.6 m)    Body mass index is 33.5 kg/m.  Physical Exam  Constitutional: Patient appears well-developed and well-nourished. Obese No distress.  HEENT: head atraumatic, normocephalic, pupils equal and reactive to light Cardiovascular: Normal rate, regular rhythm and normal heart sounds.  No murmur heard. No BLE edema. Pulmonary/Chest: Effort  normal and breath sounds normal. No respiratory distress. Abdominal: Soft.  There is no tenderness. Psychiatric: Patient has a normal mood and affect. behavior is normal. Judgment and thought content normal.  Recent Results (from the past 2160 hour(s))  Cytology - PAP     Status: Abnormal   Collection Time: 05/03/19  2:23 PM  Result Value Ref Range   High risk HPV Positive (A)    Adequacy      Satisfactory for evaluation; transformation zone component ABSENT.   Diagnosis (A)     - Atypical squamous cells of undetermined significance (ASC-US)   Microorganisms Shift in flora suggestive of bacterial vaginosis    Comment Normal Reference Range HPV - Negative   Surgical pathology     Status: None   Collection Time: 05/26/19  3:04 PM  Result Value Ref Range   SURGICAL PATHOLOGY      SURGICAL PATHOLOGY CASE: [id number] PATIENT: 03-01-1999 [facility] Report     Clinical History: ASCUS with positive high risk HPV (cm)     FINAL MICROSCOPIC DIAGNOSIS:  A. CERVIX, 9 O'CLOCK, BIOPSY: -  Benign squamous epithelium -  No transitional epithelium identified  B. ENDOCERVIX, CURETTAGE: -  Low grade squamous intraepithelial lesion (CIN1, mild dysplasia) -  See comment  COMMENT:  B.  P16 immunohistochemistry supports the diagnosis of low-grade dysplasia.   GROSS DESCRIPTION:  Specimen A: Received in formalin is a gray-white soft tissue fragment that is submitted in toto.  Size: 0.25 cm, 1 block submitted.  Specimen B: Received in formalin is scant mucoid material.  Submitted 1 block.  SW 05/29/2019    Final Diagnosis performed by 05/31/2019, MD.   Electronically signed 05/31/2019 Technical and / or Professional components performed at [facility]. [facility], [address], [place], [place].  Immu nohistochemistry Technical component (if applicable) was performed at [facility]. [address], [address],  [place], [place] [address].   IMMUNOHISTOCHEMISTRY DISCLAIMER (if applicable): Some of these immunohistochemical stains may have been developed and the performance characteristics determine by [facility]. Some may not have been cleared or approved by the U.S. Food and Drug Administration. The FDA has determined that such clearance or approval is not necessary. This test is used for clinical purposes. It should not be regarded as investigational or for research. This laboratory is certified under the Clinical Laboratory Improvement Amendments of 1988 (CLIA-88) as qualified to perform high complexity clinical laboratory testing.  The controls stained appropriately.      PHQ2/9: Depression screen [facility] 2/9 06/27/2019 06/27/2019 05/03/2019 04/27/2019 04/27/2019  Decreased Interest 1 1 1 1  0  Down, Depressed, Hopeless 1 1 1  0 0  PHQ - 2 Score 2 2 2 1  0  Altered sleeping 2 2 1 3  0  Tired, decreased energy 1 1 1 3  0  Change in appetite 2 2 1 3  0  Feeling bad or failure about yourself  0 0 0 0 0  Trouble concentrating 1 1 0 1 0  Moving slowly or fidgety/restless 0 0 0 0 0  Suicidal thoughts 0 0 0 0 0  PHQ-9 Score 8 8 5 11  0  Difficult doing work/chores Somewhat difficult Somewhat difficult - - -  Some recent data might be hidden    phq 9 is positive   Fall Risk: Fall Risk  06/27/2019 04/27/2019 03/23/2019 12/21/2018 11/11/2018  Falls in the past year? 0 0 0 0 0  Number falls in past yr: 0 0 0 0 -  Injury with Fall? 0 0 0 0 -    Functional Status Survey: Is the patient deaf or have difficulty hearing?: No Does the patient have difficulty seeing, even when wearing glasses/contacts?: Yes Does the patient have difficulty concentrating, remembering, or making decisions?: No Does the patient have difficulty walking or climbing stairs?: No Does the patient have difficulty dressing or bathing?: No Does the patient have difficulty doing errands alone such as visiting a doctor's  office or shopping?: No    Assessment & Plan  1. B12 deficiency  - cyanocobalamin ((VITAMIN B-12)) injection 1,000 mcg  2. Mood disorder (HCC)  - cariprazine (VRAYLAR) capsule; Take 1 capsule (1.5 mg total) by mouth daily.  Dispense: 30 capsule; Refill: 2 - pregabalin (LYRICA) 200 MG capsule; Take 1 capsule (200 mg total) by mouth 3 (three) times daily.  Dispense: 90 capsule; Refill: 2  3. Vitamin D deficiency  - Vitamin D, Ergocalciferol, (DRISDOL) 1.25 MG (50000 UNIT) CAPS capsule; Take 1 capsule (50,000 Units total) by mouth every 7 (seven) days.  Dispense: 12 capsule; Refill: 1  4. Fibromyalgia syndrome  - pregabalin (LYRICA) 200 MG capsule; Take 1 capsule (200 mg total) by mouth 3 (three) times daily.  Dispense: 90 capsule; Refill: 2  5. Other fatigue   6. Arthralgia of both hands  Doing well on meloxicam  7. Neuropathic pain  - pregabalin (LYRICA) 200 MG capsule; Take 1 capsule (200 mg total) by mouth 3 (three) times daily.  Dispense: 90 capsule; Refill: 2  8. 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

## 2019-07-28 ENCOUNTER — Other Ambulatory Visit: Payer: Self-pay

## 2019-07-28 ENCOUNTER — Ambulatory Visit (INDEPENDENT_AMBULATORY_CARE_PROVIDER_SITE_OTHER): Payer: BC Managed Care – PPO | Admitting: Emergency Medicine

## 2019-07-28 DIAGNOSIS — E538 Deficiency of other specified B group vitamins: Secondary | ICD-10-CM | POA: Diagnosis not present

## 2019-07-28 MED ORDER — CYANOCOBALAMIN 1000 MCG/ML IJ SOLN
1000.0000 ug | Freq: Once | INTRAMUSCULAR | Status: AC
  Administered 2019-07-28: 1000 ug via INTRAMUSCULAR

## 2019-07-28 NOTE — Progress Notes (Signed)
B12 injection 

## 2019-08-14 ENCOUNTER — Encounter: Payer: Self-pay | Admitting: Family Medicine

## 2019-08-14 ENCOUNTER — Other Ambulatory Visit: Payer: Self-pay | Admitting: Family Medicine

## 2019-08-14 DIAGNOSIS — M25542 Pain in joints of left hand: Secondary | ICD-10-CM

## 2019-08-14 DIAGNOSIS — M791 Myalgia, unspecified site: Secondary | ICD-10-CM

## 2019-08-14 DIAGNOSIS — E538 Deficiency of other specified B group vitamins: Secondary | ICD-10-CM

## 2019-08-14 DIAGNOSIS — R5383 Other fatigue: Secondary | ICD-10-CM

## 2019-08-14 DIAGNOSIS — E559 Vitamin D deficiency, unspecified: Secondary | ICD-10-CM

## 2019-08-14 DIAGNOSIS — M25541 Pain in joints of right hand: Secondary | ICD-10-CM

## 2019-08-14 DIAGNOSIS — R768 Other specified abnormal immunological findings in serum: Secondary | ICD-10-CM

## 2019-08-15 DIAGNOSIS — M25541 Pain in joints of right hand: Secondary | ICD-10-CM | POA: Diagnosis not present

## 2019-08-15 DIAGNOSIS — E538 Deficiency of other specified B group vitamins: Secondary | ICD-10-CM | POA: Diagnosis not present

## 2019-08-15 DIAGNOSIS — R768 Other specified abnormal immunological findings in serum: Secondary | ICD-10-CM | POA: Diagnosis not present

## 2019-08-15 DIAGNOSIS — R5383 Other fatigue: Secondary | ICD-10-CM | POA: Diagnosis not present

## 2019-08-18 LAB — CBC WITH DIFFERENTIAL/PLATELET
Absolute Monocytes: 517 cells/uL (ref 200–950)
Basophils Absolute: 39 cells/uL (ref 0–200)
Basophils Relative: 0.7 %
Eosinophils Absolute: 39 cells/uL (ref 15–500)
Eosinophils Relative: 0.7 %
HCT: 34.8 % — ABNORMAL LOW (ref 35.0–45.0)
Hemoglobin: 11.9 g/dL (ref 11.7–15.5)
Lymphs Abs: 2646 cells/uL (ref 850–3900)
MCH: 30.2 pg (ref 27.0–33.0)
MCHC: 34.2 g/dL (ref 32.0–36.0)
MCV: 88.3 fL (ref 80.0–100.0)
MPV: 11.2 fL (ref 7.5–12.5)
Monocytes Relative: 9.4 %
Neutro Abs: 2261 cells/uL (ref 1500–7800)
Neutrophils Relative %: 41.1 %
Platelets: 255 10*3/uL (ref 140–400)
RBC: 3.94 10*6/uL (ref 3.80–5.10)
RDW: 12.4 % (ref 11.0–15.0)
Total Lymphocyte: 48.1 %
WBC: 5.5 10*3/uL (ref 3.8–10.8)

## 2019-08-18 LAB — COMPLETE METABOLIC PANEL WITH GFR
AG Ratio: 1.5 (calc) (ref 1.0–2.5)
ALT: 10 U/L (ref 6–29)
AST: 14 U/L (ref 10–30)
Albumin: 4.3 g/dL (ref 3.6–5.1)
Alkaline phosphatase (APISO): 55 U/L (ref 31–125)
BUN: 14 mg/dL (ref 7–25)
CO2: 27 mmol/L (ref 20–32)
Calcium: 8.9 mg/dL (ref 8.6–10.2)
Chloride: 104 mmol/L (ref 98–110)
Creat: 0.83 mg/dL (ref 0.50–1.10)
GFR, Est African American: 102 mL/min/{1.73_m2} (ref >=60)
GFR, Est Non African American: 88 mL/min/{1.73_m2} (ref >=60)
Globulin: 2.8 g/dL (calc) (ref 1.9–3.7)
Glucose, Bld: 84 mg/dL (ref 65–99)
Potassium: 4.4 mmol/L (ref 3.5–5.3)
Sodium: 139 mmol/L (ref 135–146)
Total Bilirubin: 0.3 mg/dL (ref 0.2–1.2)
Total Protein: 7.1 g/dL (ref 6.1–8.1)

## 2019-08-18 LAB — ANA,IFA RA DIAG PNL W/RFLX TIT/PATN
Anti Nuclear Antibody (ANA): POSITIVE — AB
Cyclic Citrullin Peptide Ab: <16 UNITS
Rheumatoid fact SerPl-aCnc: <14 IU/mL (ref <14)

## 2019-08-18 LAB — SEDIMENTATION RATE: Sed Rate: 14 mm/h (ref 0–20)

## 2019-08-18 LAB — ANTI-NUCLEAR AB-TITER (ANA TITER): ANA Titer 1: 1:80 {titer} — ABNORMAL HIGH

## 2019-08-18 LAB — TSH: TSH: 1.35 mIU/L

## 2019-08-18 LAB — C-REACTIVE PROTEIN: CRP: 0.7 mg/L (ref <8.0)

## 2019-08-18 LAB — VITAMIN B12: Vitamin B-12: >2000 pg/mL — ABNORMAL HIGH (ref 200–1100)

## 2019-08-21 ENCOUNTER — Other Ambulatory Visit: Payer: Self-pay | Admitting: Family Medicine

## 2019-08-21 DIAGNOSIS — R21 Rash and other nonspecific skin eruption: Secondary | ICD-10-CM

## 2019-08-21 DIAGNOSIS — R768 Other specified abnormal immunological findings in serum: Secondary | ICD-10-CM

## 2019-08-21 DIAGNOSIS — M25542 Pain in joints of left hand: Secondary | ICD-10-CM

## 2019-08-21 DIAGNOSIS — M25541 Pain in joints of right hand: Secondary | ICD-10-CM

## 2019-08-21 NOTE — Progress Notes (Unsigned)
re

## 2019-08-25 ENCOUNTER — Ambulatory Visit: Payer: BC Managed Care – PPO

## 2019-09-05 ENCOUNTER — Other Ambulatory Visit: Payer: Self-pay | Admitting: Family Medicine

## 2019-09-05 DIAGNOSIS — M79672 Pain in left foot: Secondary | ICD-10-CM

## 2019-09-05 DIAGNOSIS — M79671 Pain in right foot: Secondary | ICD-10-CM

## 2019-09-05 NOTE — Telephone Encounter (Signed)
Requested Prescriptions  Pending Prescriptions Disp Refills  . meloxicam (MOBIC) 15 MG tablet [Pharmacy Med Name: MELOXICAM 15MG  TABLETS] 90 tablet 0    Sig: TAKE 1 TABLET(15 MG) BY MOUTH DAILY     Analgesics:  COX2 Inhibitors Passed - 09/05/2019  3:38 AM      Passed - HGB in normal range and within 360 days    Hemoglobin  Date Value Ref Range Status  08/15/2019 11.9 11.7 - 15.5 g/dL Final         Passed - Cr in normal range and within 360 days    Creat  Date Value Ref Range Status  08/15/2019 0.83 0.50 - 1.10 mg/dL Final         Passed - Patient is not pregnant      Passed - Valid encounter within last 12 months    Recent Outpatient Visits          2 months ago Mood disorder [facility])   [facility], MD   4 months ago Pain in both feet   [facility], MD   5 months ago Mood disorder [facility])   [facility], MD   8 months ago Mood disorder [facility])   [facility], MD   9 months ago Gastroenteritis   [facility], NP      Future Appointments            In 3 weeks Cheryle Horsfall, MD [facility], [facility]   In 2 months [facility], MD [facility]

## 2019-09-06 NOTE — Progress Notes (Signed)
Patient ID: Deborah Navarro, female    DOB: July 28, 1980, 39 y.o.   MRN: [id number]  PCP: Alba Cory, MD  Chief Complaint  Patient presents with  . Urinary Frequency    onset yesterday 09/06/19  . Dysuria  . bladder pain    pain goes up into belly button or sometimes around her stomach to her back    Subjective:   Deborah Navarro is a 39 y.o. female, presents to clinic with CC of the following:  Chief Complaint  Patient presents with  . Urinary Frequency    onset yesterday 09/06/19  . Dysuria  . bladder pain    pain goes up into belly button or sometimes around her stomach to her back    HPI:  Patient is a 39 year old female patient of Dr. Carlynn Purl with her most recent notes reviewed, who presents with urinary frequency that started yesterday with increased discomfort with urination, and some suprapubic pain that was more of a pressure feeling, initially went up towards her bellybutton, and at 1 point last night, wrapped around her waistline to her lower back just briefly, a few minutes, and then resolved.  No pains in the upper back area.  She notes the suprapubic pressure feeling builds as she has to urinate, and then is better after. She denies any fevers, no nausea or vomiting.  No other increased abdominal pains.  She notes she always has problems with bowel movements, as she is not very regular, although had a bowel movement this morning that was normal, no diarrhea, no black or dark stool, no bleeding per rectum.  He notes no blood in the urine. She denied a history of UTIs. Also denied any vaginal symptoms of concern with no discharge.  No discomfort.  Patient Active Problem List   Diagnosis Date Noted  . Mood disorder (HCC) 08/05/2018  . Papanicolaou smear of cervix with positive high risk human papilloma virus (HPV) test 05/04/2018  . False positive ana 04/28/2018  . Pain in both feet 04/28/2018  . Facial rash 04/07/2018  . Chronic fatigue 04/07/2018    . Recurrent abdominal pain 10/12/2017  . Chronic neck pain 03/27/2016  . Myalgia 03/27/2016  . Left-sided headache 03/27/2016  . Family history of early CAD 03/15/2015  . History of colon polyps 06/30/2010  . History of anemia 06/30/2010  . Depression, major, in remission ([facility]CC) 06/30/2010  . Endometriosis 06/30/2010  . CARDIAC MURMUR 06/30/2010  . History of chicken pox 06/30/2010      Current Outpatient Medications:  .  cariprazine (VRAYLAR) capsule, Take 1 capsule (1.5 mg total) by mouth daily., Disp: 30 capsule, Rfl: 2 .  Cyanocobalamin (B-12) 1000 MCG SUBL, Place 1 tablet under the tongue daily., Disp: 30 tablet, Rfl: 0 .  meloxicam (MOBIC) 15 MG tablet, TAKE 1 TABLET(15 MG) BY MOUTH DAILY, Disp: 90 tablet, Rfl: 0 .  pregabalin (LYRICA) 200 MG capsule, Take 1 capsule (200 mg total) by mouth 3 (three) times daily., Disp: 90 capsule, Rfl: 2 .  Vitamin D, Ergocalciferol, (DRISDOL) 1.25 MG (50000 UNIT) CAPS capsule, Take 1 capsule (50,000 Units total) by mouth every 7 (seven) days., Disp: 12 capsule, Rfl: 1   No Known Allergies   Past Surgical History:  Procedure Laterality Date  . COLONOSCOPY WITH PROPOFOL N/A 05/16/2018   Procedure: COLONOSCOPY WITH PROPOFOL;  Surgeon: Midge Minium, MD;  Location: [facility];  Service: Endoscopy;  Laterality: N/A;  . DILATION AND CURETTAGE OF UTERUS    .  LAPAROSCOPIC ENDOMETRIOSIS FULGURATION  10-1999   endometriosis  . NECK SURGERY N/A 06/14/15   due to C5/6 C6-7 disc herniation   . open heart surgery  12-1994   asd repair     Family History  Problem Relation Age of Onset  . Cancer Mother 66       non hodgkins lymphoma(mets to brain)  . Coronary artery disease Father   . Hypertension Father   . Hyperlipidemia Father   . Diabetes Father   . Diabetes Maternal Grandmother   . Heart attack Maternal Grandfather   . Other Maternal Grandfather        Deterioration of Jaw  . Stroke Paternal Grandmother   . Stroke Paternal  Grandfather      Social History   Tobacco Use  . Smoking status: Never Smoker  . Smokeless tobacco: Never Used  Substance Use Topics  . Alcohol use: Not Currently    Alcohol/week: 1.0 standard drinks    Types: 1 Shots of liquor per week    Comment: very occasionally (couple x/yr)    With staff assistance, above reviewed with the patient today.  ROS: As per HPI, otherwise no specific complaints on a limited and focused system review   Results for orders placed or performed in visit on 09/07/19 (from the past 72 hour(s))  POCT Urinalysis Dipstick     Status: Abnormal   Collection Time: 09/07/19 10:03 AM  Result Value Ref Range   Color, UA Light Yellow    Clarity, UA Clear    Glucose, UA Negative Negative   Bilirubin, UA Negative    Ketones, UA Negative    Spec Grav, UA <=1.005 (A) 1.010 - 1.025   Blood, UA Negative    pH, UA 8.0 5.0 - 8.0   Protein, UA Negative Negative   Urobilinogen, UA 0.2 0.2 or 1.0 E.U./dL   Nitrite, UA Negative    Leukocytes, UA Trace (A) Negative   Appearance clear    Odor normal      PHQ2/9: Depression screen [facility] 2/9 09/07/2019 06/27/2019 06/27/2019 05/03/2019 04/27/2019  Decreased Interest 0 1 1 1 1   Down, Depressed, Hopeless 0 1 1 1  0  PHQ - 2 Score 0 2 2 2 1   Altered sleeping 2 2 2 1 3   Tired, decreased energy 3 1 1 1 3   Change in appetite 0 2 2 1 3   Feeling bad or failure about yourself  0 0 0 0 0  Trouble concentrating 0 1 1 0 1  Moving slowly or fidgety/restless 0 0 0 0 0  Suicidal thoughts 0 0 0 0 0  PHQ-9 Score 5 8 8 5 11   Difficult doing work/chores Not difficult at all Somewhat difficult Somewhat difficult - -  Some recent data might be hidden   PHQ-2/9 Result reviewed  Fall Risk: Fall Risk  09/07/2019 06/27/2019 04/27/2019 03/23/2019 12/21/2018  Falls in the past year? 0 0 0 0 0  Number falls in past yr: 0 0 0 0 0  Injury with Fall? 0 0 0 0 0      Objective:   Vitals:   09/07/19 0946  BP: 112/76  Pulse: 88  Resp: 16    Temp: (!) 97.3 F (36.3 C)  TempSrc: Temporal  SpO2: 99%  Weight: 185 lb 3.2 oz (84 kg)  Height: 5\' 3"  (1.6 m)    Body mass index is 32.81 kg/m.  Physical Exam   NAD, masked, pleasant HEENT - /AT, sclera anicteric, conj - non-inj;ed,  pharynx clear Neck - supple, no adenopathy,  Car - RRR without m/g/r Pulm- RR and effort normal at rest, CTA without wheeze or rales Abd - soft, NT, ND, BS+,  no masses, no HSM, minimal tenderness in the suprapubic region over her bladder, not marked, with no right or left suprapubic tenderness, Back - no CVA tenderness Neuro/psychiatric - affect was not flat, appropriate with conversation  Alert and oriented  Speech normal   Results for orders placed or performed in visit on 09/07/19  POCT Urinalysis Dipstick  Result Value Ref Range   Color, UA Light Yellow    Clarity, UA Clear    Glucose, UA Negative Negative   Bilirubin, UA Negative    Ketones, UA Negative    Spec Grav, UA <=1.005 (A) 1.010 - 1.025   Blood, UA Negative    pH, UA 8.0 5.0 - 8.0   Protein, UA Negative Negative   Urobilinogen, UA 0.2 0.2 or 1.0 E.U./dL   Nitrite, UA Negative    Leukocytes, UA Trace (A) Negative   Appearance clear    Odor normal        Assessment & Plan:    1. Frequency of urination Patient's symptoms seem very consistent with a possible bladder infection, especially the discomfort with urination, although the urine dip was only positive for trace leukocytes, with nitrite and blood negative.  We will send for a UA and culture. Discussed with patient the options of waiting until the culture returns before starting antibiotic if it is positive, or starting 1 empirically due to the clinical concerns.  She very much agreed with doing the latter and felt that was reasonable. - POCT Urinalysis Dipstick - Urinalysis, Complete - Urine Culture - nitrofurantoin, macrocrystal-monohydrate, (MACROBID) 100 MG capsule; Take 1 capsule (100 mg total) by mouth 2  (two) times daily for 5 days.  Dispense: 10 capsule; Refill: 0 Await culture result presently, and if symptoms not improving or worsening over time will follow up.  2. Suprapubic abdominal pain As above.  Concern for possible UTI as source. - nitrofurantoin, macrocrystal-monohydrate, (MACROBID) 100 MG capsule; Take 1 capsule (100 mg total) by mouth 2 (two) times daily for 5 days.  Dispense: 10 capsule; Refill: 0  3. Recurrent abdominal pain She noted a history of problems with bowel movements, and abdominal pains recurrent in the past also noted, although she did have a normal bowel movement this morning, and am concerned with the potential of a UTI presently as the source of her symptoms. Await response to above.        Towanda Malkin, MD 09/07/19 10:16 AM

## 2019-09-07 ENCOUNTER — Encounter: Payer: Self-pay | Admitting: Internal Medicine

## 2019-09-07 ENCOUNTER — Ambulatory Visit (INDEPENDENT_AMBULATORY_CARE_PROVIDER_SITE_OTHER): Payer: BC Managed Care – PPO | Admitting: Internal Medicine

## 2019-09-07 ENCOUNTER — Other Ambulatory Visit: Payer: Self-pay

## 2019-09-07 VITALS — BP 112/76 | HR 88 | Temp 97.3°F | Resp 16 | Ht 63.0 in | Wt 185.2 lb

## 2019-09-07 DIAGNOSIS — R102 Pelvic and perineal pain: Secondary | ICD-10-CM

## 2019-09-07 DIAGNOSIS — R109 Unspecified abdominal pain: Secondary | ICD-10-CM | POA: Diagnosis not present

## 2019-09-07 DIAGNOSIS — R350 Frequency of micturition: Secondary | ICD-10-CM

## 2019-09-07 LAB — POCT URINALYSIS DIPSTICK
Bilirubin, UA: NEGATIVE
Blood, UA: NEGATIVE
Glucose, UA: NEGATIVE
Ketones, UA: NEGATIVE
Nitrite, UA: NEGATIVE
Odor: NORMAL
Protein, UA: NEGATIVE
Spec Grav, UA: <=1.005 — AB (ref 1.010–1.025)
Urobilinogen, UA: 0.2 E.U./dL
pH, UA: 8 (ref 5.0–8.0)

## 2019-09-07 MED ORDER — NITROFURANTOIN MONOHYD MACRO 100 MG PO CAPS: 100.0000 mg | ORAL_CAPSULE | Freq: Two times a day (BID) | ORAL | 0 refills | Status: AC

## 2019-09-08 LAB — URINALYSIS, COMPLETE
Bacteria, UA: NONE SEEN /HPF
Bilirubin Urine: NEGATIVE
Glucose, UA: NEGATIVE
Hgb urine dipstick: NEGATIVE
Hyaline Cast: NONE SEEN /LPF
Ketones, ur: NEGATIVE
Leukocytes,Ua: NEGATIVE
Nitrite: NEGATIVE
Protein, ur: NEGATIVE
RBC / HPF: NONE SEEN /HPF (ref 0–2)
Specific Gravity, Urine: 1.009 (ref 1.001–1.03)
WBC, UA: NONE SEEN /HPF (ref 0–5)
pH: 8 (ref 5.0–8.0)

## 2019-09-08 LAB — URINE CULTURE
MICRO NUMBER:: 10293833
SPECIMEN QUALITY:: ADEQUATE

## 2019-09-26 ENCOUNTER — Ambulatory Visit (INDEPENDENT_AMBULATORY_CARE_PROVIDER_SITE_OTHER): Payer: BC Managed Care – PPO | Admitting: Family Medicine

## 2019-09-26 ENCOUNTER — Encounter: Payer: Self-pay | Admitting: Family Medicine

## 2019-09-26 ENCOUNTER — Other Ambulatory Visit: Payer: Self-pay

## 2019-09-26 VITALS — BP 110/70 | HR 83 | Temp 97.5°F | Resp 16 | Ht 63.0 in | Wt 182.7 lb

## 2019-09-26 DIAGNOSIS — M797 Fibromyalgia: Secondary | ICD-10-CM

## 2019-09-26 DIAGNOSIS — K582 Mixed irritable bowel syndrome: Secondary | ICD-10-CM

## 2019-09-26 DIAGNOSIS — K589 Irritable bowel syndrome without diarrhea: Secondary | ICD-10-CM | POA: Insufficient documentation

## 2019-09-26 DIAGNOSIS — F419 Anxiety disorder, unspecified: Secondary | ICD-10-CM

## 2019-09-26 DIAGNOSIS — F99 Mental disorder, not otherwise specified: Secondary | ICD-10-CM

## 2019-09-26 DIAGNOSIS — R768 Other specified abnormal immunological findings in serum: Secondary | ICD-10-CM

## 2019-09-26 DIAGNOSIS — E559 Vitamin D deficiency, unspecified: Secondary | ICD-10-CM | POA: Diagnosis not present

## 2019-09-26 DIAGNOSIS — R5383 Other fatigue: Secondary | ICD-10-CM

## 2019-09-26 DIAGNOSIS — M792 Neuralgia and neuritis, unspecified: Secondary | ICD-10-CM

## 2019-09-26 DIAGNOSIS — F39 Unspecified mood [affective] disorder: Secondary | ICD-10-CM

## 2019-09-26 DIAGNOSIS — F5105 Insomnia due to other mental disorder: Secondary | ICD-10-CM

## 2019-09-26 MED ORDER — CARIPRAZINE HCL 1.5 MG PO CAPS: 1.5000 mg | ORAL_CAPSULE | Freq: Every day | ORAL | 2 refills | Status: DC

## 2019-09-26 MED ORDER — HYOSCYAMINE SULFATE 0.125 MG PO TABS: 0.1250 mg | ORAL_TABLET | ORAL | 0 refills | Status: DC | PRN

## 2019-09-26 MED ORDER — HYDROXYZINE HCL 10 MG PO TABS: 10.0000 mg | ORAL_TABLET | Freq: Three times a day (TID) | ORAL | 0 refills | Status: DC | PRN

## 2019-09-26 MED ORDER — PREGABALIN 200 MG PO CAPS: 200.0000 mg | ORAL_CAPSULE | Freq: Three times a day (TID) | ORAL | 2 refills | Status: DC

## 2019-09-26 MED ORDER — VITAMIN D (ERGOCALCIFEROL) 1.25 MG (50000 UNIT) PO CAPS: 50000.0000 [IU] | ORAL_CAPSULE | ORAL | 1 refills | Status: DC

## 2019-09-26 NOTE — Progress Notes (Signed)
Name: Deborah RaringBrandi H Tollett   MRN: [id number]    DOB: 1980/06/23   Date:09/26/2019       Progress Note  Subjective  Chief Complaint  Chief Complaint  Patient presents with  . Fatigue  . Depression  . Mood Disorder    HPI  Fatigue: B12 is back to normal but she continues to feel very tired. Explained likely multifactorial, she is waiting to see Rheumatologist next month.   Feet Pain: she states pain has been going on for about 6 months ,she states started with pain on forefoot, goes from toes to ball of her feet, currently worse on left foot. She states burning like, sometimes aching, sometimes it gets red at the end of the day, she also has intermittent swelling that is worse in the morning. She has been taking Meloxicam and warm weather has helped   FMS: she is on Lyrica, she states she still has pain, but mostly from neuropathy on her calves and worse during the day, she is taking 200 mg TID . She states that meloxicam has helped. She has history of elevated ANA , sed rate and CRP and sed rate but last levels were better   Mood Disorder: . Insurance denied [employer] initially , she has tried Abilify, Elavil and Duloxetine but did not work well for her. She is back on Vraylar 1.5 mg and states feels tired during the day, falls asleep at 9  pm and wakes up between 3-5 am and still feels tired when she wakes up. She states improved once she started taking medication at dinner instead of in the morning, it really helps with her mood and she prefers not changing medication at this time. She has noticed increase in anxiety, worries about what if's. Worries that the car may break down, the house break down... she is always worried because of cost of repair.   IBS: long history of periods of constipation with intermittent diarrhea that is associated with bloating, and abdominal pain, no blood or weight loss. She is having a flare today, we will try levsin   Patient Active Problem List   Diagnosis Date Noted  . Mood disorder (HCC) 08/05/2018  . Papanicolaou smear of cervix with positive high risk human papilloma virus (HPV) test 05/04/2018  . False positive ana 04/28/2018  . Pain in both feet 04/28/2018  . Facial rash 04/07/2018  . Chronic fatigue 04/07/2018  . Recurrent abdominal pain 10/12/2017  . Chronic neck pain 03/27/2016  . Myalgia 03/27/2016  . Left-sided headache 03/27/2016  . Family history of early CAD 03/15/2015  . History of colon polyps 06/30/2010  . History of anemia 06/30/2010  . Depression, major, in remission (HCC) 06/30/2010  . Endometriosis 06/30/2010  . CARDIAC MURMUR 06/30/2010  . History of chicken pox 06/30/2010    Past Surgical History:  Procedure Laterality Date  . COLONOSCOPY WITH PROPOFOL N/A 05/16/2018   Procedure: COLONOSCOPY WITH PROPOFOL;  Surgeon: Midge MiniumWohl, Darren, MD;  [facility];  Service: Endoscopy;  Laterality: N/A;  . DILATION AND CURETTAGE OF UTERUS    . LAPAROSCOPIC ENDOMETRIOSIS FULGURATION  10-1999   endometriosis  . NECK SURGERY N/A 06/14/15   due to C5/6 C6-7 disc herniation   . open heart surgery  12-1994   asd repair    Family History  Problem Relation Age of Onset  . Cancer Mother 7651       non hodgkins lymphoma(mets to brain)  . Coronary artery disease Father   . Hypertension  Father   . Hyperlipidemia Father   . Diabetes Father   . Diabetes Maternal Grandmother   . Heart attack Maternal Grandfather   . Other Maternal Grandfather        Deterioration of Jaw  . Stroke Paternal Grandmother   . Stroke Paternal Grandfather     Social History   Tobacco Use  . Smoking status: Never Smoker  . Smokeless tobacco: Never Used  Substance Use Topics  . Alcohol use: Not Currently    Alcohol/week: 1.0 standard drinks    Types: 1 Shots of liquor per week    Comment: very occasionally (couple x/yr)     Current Outpatient Medications:  .  cariprazine (VRAYLAR) capsule, Take 1 capsule (1.5 mg total)  by mouth daily., Disp: 30 capsule, Rfl: 2 .  Cyanocobalamin (B-12) 1000 MCG SUBL, Place 1 tablet under the tongue daily., Disp: 30 tablet, Rfl: 0 .  meloxicam (MOBIC) 15 MG tablet, TAKE 1 TABLET(15 MG) BY MOUTH DAILY, Disp: 90 tablet, Rfl: 0 .  pregabalin (LYRICA) 200 MG capsule, Take 1 capsule (200 mg total) by mouth 3 (three) times daily., Disp: 90 capsule, Rfl: 2 .  Vitamin D, Ergocalciferol, (DRISDOL) 1.25 MG (50000 UNIT) CAPS capsule, Take 1 capsule (50,000 Units total) by mouth every 7 (seven) days., Disp: 12 capsule, Rfl: 1  No Known Allergies  I personally reviewed active problem list, medication list, allergies, family history, social history, health maintenance with the patient/caregiver today.   ROS  Constitutional: Negative for fever or weight change.  Respiratory: Negative for cough and shortness of breath.   Cardiovascular: Negative for chest pain or palpitations.  Gastrointestinal: Negative for abdominal pain, no bowel changes.  Musculoskeletal: Negative for gait problem or joint swelling.  Skin: Negative for rash.  Neurological: Negative for dizziness or headache.  No other specific complaints in a complete review of systems (except as listed in HPI above).  Objective  Vitals:   09/26/19 1356  BP: 110/70  Pulse: 83  Resp: 16  Temp: (!) 97.5 F (36.4 C)  TempSrc: Temporal  SpO2: 99%  Weight: 182 lb 11.2 oz (82.9 kg)  Height: 5\' 3"  (1.6 m)    Body mass index is 32.36 kg/m.  Physical Exam  Constitutional: Patient appears well-developed and well-nourished. Obese  No distress.  HEENT: head atraumatic, normocephalic, pupils equal and reactive to light Cardiovascular: Normal rate, regular rhythm and normal heart sounds.  No murmur heard. No BLE edema. Pulmonary/Chest: Effort normal and breath sounds normal. No respiratory distress. Abdominal: Soft.  There is no tenderness. Psychiatric: Patient has a normal mood and affect. behavior is normal. Judgment and  thought content normal.  Recent Results (from the past 2160 hour(s))  CBC with Differential/Platelet     Status: Abnormal   Collection Time: 08/15/19  2:21 PM  Result Value Ref Range   WBC 5.5 3.8 - 10.8 Thousand/uL   RBC 3.94 3.80 - 5.10 Million/uL   Hemoglobin 11.9 11.7 - 15.5 g/dL   HCT 10/15/19 (L) 82.9 - 56.2 %   MCV 88.3 80.0 - 100.0 fL   MCH 30.2 27.0 - 33.0 pg   MCHC 34.2 32.0 - 36.0 g/dL   RDW 13.0 86.5 - 78.4 %   Platelets 255 140 - 400 Thousand/uL   MPV 11.2 7.5 - 12.5 fL   Neutro Abs 2,261 1,500 - 7,800 cells/uL   Lymphs Abs 2,646 850 - 3,900 cells/uL   Absolute Monocytes 517 200 - 950 cells/uL   Eosinophils Absolute 39 15 -  500 cells/uL   Basophils Absolute 39 0 - 200 cells/uL   Neutrophils Relative % 41.1 %   Total Lymphocyte 48.1 %   Monocytes Relative 9.4 %   Eosinophils Relative 0.7 %   Basophils Relative 0.7 %  COMPLETE METABOLIC PANEL WITH GFR     Status: None   Collection Time: 08/15/19  2:21 PM  Result Value Ref Range   Glucose, Bld 84 65 - 99 mg/dL    Comment: .            Fasting reference interval .    BUN 14 7 - 25 mg/dL   Creat 0.83 0.50 - 1.10 mg/dL   GFR, Est Non African American 88 > OR = 60 mL/min/1.17m2   GFR, Est African American 102 > OR = 60 mL/min/1.52m2   BUN/Creatinine Ratio NOT APPLICABLE 6 - 22 (calc)   Sodium 139 135 - 146 mmol/L   Potassium 4.4 3.5 - 5.3 mmol/L   Chloride 104 98 - 110 mmol/L   CO2 27 20 - 32 mmol/L   Calcium 8.9 8.6 - 10.2 mg/dL   Total Protein 7.1 6.1 - 8.1 g/dL   Albumin 4.3 3.6 - 5.1 g/dL   Globulin 2.8 1.9 - 3.7 g/dL (calc)   AG Ratio 1.5 1.0 - 2.5 (calc)   Total Bilirubin 0.3 0.2 - 1.2 mg/dL   Alkaline phosphatase (APISO) 55 31 - 125 U/L   AST 14 10 - 30 U/L   ALT 10 6 - 29 U/L  Vitamin B12     Status: Abnormal   Collection Time: 08/15/19  2:21 PM  Result Value Ref Range   Vitamin B-12 >2,000 (H) 200 - 1,100 pg/mL  TSH     Status: None   Collection Time: 08/15/19  2:21 PM  Result Value Ref Range    TSH 1.35 mIU/L    Comment:           Reference Range .           > or = 20 Years  0.40-4.50 .                Pregnancy Ranges           First trimester    0.26-2.66           Second trimester   0.55-2.73           Third trimester    0.43-2.91   C-reactive protein     Status: None   Collection Time: 08/15/19  2:21 PM  Result Value Ref Range   CRP 0.7 <8.0 mg/L  Sedimentation rate     Status: None   Collection Time: 08/15/19  2:21 PM  Result Value Ref Range   Sed Rate 14 0 - 20 mm/h  ANA,IFA RA Diag Pnl w/rflx Tit/Patn     Status: Abnormal   Collection Time: 08/15/19  2:21 PM  Result Value Ref Range   Anti Nuclear Antibody (ANA) POSITIVE (A) NEGATIVE    Comment: ANA IFA is a first line screen for detecting the presence of up to approximately 150 autoantibodies in various autoimmune diseases. A positive ANA IFA result is suggestive of autoimmune disease and reflexes to titer and pattern. Further laboratory testing may be considered if clinically indicated. . For additional information, please refer to http://education.[facility].com/faq/FAQ177 (This link is being provided for informational/ educational purposes only.) .    Rhuematoid fact SerPl-aCnc <64 <33 IU/mL   Cyclic Citrullin Peptide Ab <16 UNITS  Comment: Reference Range Negative:            <20 Weak Positive:       20-39 Moderate Positive:   40-59 Strong Positive:     >59 .    INTERPRETATION      Comment: . A positive ANA, IFA indicates that one or more  antibodies associated with connective tissue disease could be positive. The RF and CCP assays are each  65-70% sensitive for established rheumatoid arthritis.  While it is still possible that this patient has  rheumatoid arthritis, other connective tissue  diseases should be considered.  Sheralyn Boatman ab-titer (ANA titer)     Status: Abnormal   Collection Time: 08/15/19  2:21 PM  Result Value Ref Range   ANA Titer 1 1:80 (H) titer    Comment:  A low level ANA titer may be present in pre-clinical autoimmune diseases and normal individuals.                 Reference Range                 <1:40        Negative                 1:40-1:80    Low Antibody Level                 >1:80        Elevated Antibody Level .    ANA Pattern 1 Nuclear, Speckled (A)     Comment: Speckled pattern is associated with mixed connective tissue disease (MCTD), systemic lupus erythematosus (SLE), Sjogren's syndrome, dermatomyositis, and  systemic sclerosis/polymyositis overlap. . AC-2,4,5,29: Speckled . International Consensus on ANA Patterns ([email or web address])   POCT Urinalysis Dipstick     Status: Abnormal   Collection Time: 09/07/19 10:03 AM  Result Value Ref Range   Color, UA Light Yellow    Clarity, UA Clear    Glucose, UA Negative Negative   Bilirubin, UA Negative    Ketones, UA Negative    Spec Grav, UA <=1.005 (A) 1.010 - 1.025   Blood, UA Negative    pH, UA 8.0 5.0 - 8.0   Protein, UA Negative Negative   Urobilinogen, UA 0.2 0.2 or 1.0 E.U./dL   Nitrite, UA Negative    Leukocytes, UA Trace (A) Negative   Appearance clear    Odor normal   Urinalysis, Complete     Status: None   Collection Time: 09/07/19 10:55 AM  Result Value Ref Range   Color, Urine YELLOW YELLOW   APPearance CLEAR CLEAR   Specific Gravity, Urine 1.009 1.001 - 1.03   pH 8.0 5.0 - 8.0   Glucose, UA NEGATIVE NEGATIVE   Bilirubin Urine NEGATIVE NEGATIVE   Ketones, ur NEGATIVE NEGATIVE   Hgb urine dipstick NEGATIVE NEGATIVE   Protein, ur NEGATIVE NEGATIVE   Nitrite NEGATIVE NEGATIVE   Leukocytes,Ua NEGATIVE NEGATIVE   WBC, UA NONE SEEN 0 - 5 /HPF   RBC / HPF NONE SEEN 0 - 2 /HPF   Squamous Epithelial / LPF 0-5 < OR = 5 /HPF   Bacteria, UA NONE SEEN NONE SEEN /HPF   Hyaline Cast NONE SEEN NONE SEEN /LPF  Urine Culture     Status: None   Collection Time: 09/07/19 10:55 AM   Specimen: Urine  Result Value Ref Range   MICRO NUMBER:  [id number]    SPECIMEN QUALITY: Adequate    Sample Source URINE  STATUS: FINAL    ISOLATE 1:      Growth of mixed flora was isolated, suggesting probable contamination. No further testing will be performed. If clinically indicated, recollection using a method to minimize contamination, with prompt transfer to Urine Culture Transport Tube, is  recommended.     PHQ2/9: Depression screen [facility] 2/9 09/26/2019 09/07/2019 06/27/2019 06/27/2019 05/03/2019  Decreased Interest 1 0 1 1 1   Down, Depressed, Hopeless 0 0 1 1 1   PHQ - 2 Score 1 0 2 2 2   Altered sleeping 1 2 2 2 1   Tired, decreased energy 3 3 1 1 1   Change in appetite 1 0 2 2 1   Feeling bad or failure about yourself  0 0 0 0 0  Trouble concentrating 1 0 1 1 0  Moving slowly or fidgety/restless 0 0 0 0 0  Suicidal thoughts 0 0 0 0 0  PHQ-9 Score 7 5 8 8 5   Difficult doing work/chores Somewhat difficult Not difficult at all Somewhat difficult Somewhat difficult -  Some recent data might be hidden    phq 9 is positive   Fall Risk: Fall Risk  09/26/2019 09/07/2019 06/27/2019 04/27/2019 03/23/2019  Falls in the past year? 0 0 0 0 0  Number falls in past yr: 0 0 0 0 0  Injury with Fall? 0 0 0 0 0      Assessment & Plan  1. Irritable bowel syndrome with both constipation and diarrhea  - hyoscyamine (LEVSIN) 0.125 MG tablet; Take 1 tablet (0.125 mg total) by mouth every 4 (four) hours as needed.  Dispense: 60 tablet; Refill: 0  2. Positive ANA (antinuclear antibody)   3. Vitamin D deficiency  - Vitamin D, Ergocalciferol, (DRISDOL) 1.25 MG (50000 UNIT) CAPS capsule; Take 1 capsule (50,000 Units total) by mouth every 7 (seven) days.  Dispense: 12 capsule; Refill: 1  4. Other fatigue  Keep follow up with Rheumatologist   5. Mood disorder (HCC)  - pregabalin (LYRICA) 200 MG capsule; Take 1 capsule (200 mg total) by mouth 3 (three) times daily.  Dispense: 90 capsule; Refill: 2 - cariprazine (VRAYLAR) capsule; Take 1 capsule (1.5  mg total) by mouth daily.  Dispense: 30 capsule; Refill: 2  6. Fibromyalgia syndrome  - pregabalin (LYRICA) 200 MG capsule; Take 1 capsule (200 mg total) by mouth 3 (three) times daily.  Dispense: 90 capsule; Refill: 2  7. Neuropathic pain  - pregabalin (LYRICA) 200 MG capsule; Take 1 capsule (200 mg total) by mouth 3 (three) times daily.  Dispense: 90 capsule; Refill: 2  8. Insomnia due to other mental disorder   9. Anxiety  - hydrOXYzine (ATARAX/VISTARIL) 10 MG tablet; Take 1 tablet (10 mg total) by mouth 3 (three) times daily as needed.  Dispense: 30 tablet; Refill: 0

## 2019-10-10 ENCOUNTER — Other Ambulatory Visit: Payer: Self-pay | Admitting: Family Medicine

## 2019-10-10 DIAGNOSIS — F39 Unspecified mood [affective] disorder: Secondary | ICD-10-CM

## 2019-10-10 DIAGNOSIS — M797 Fibromyalgia: Secondary | ICD-10-CM

## 2019-10-10 DIAGNOSIS — M792 Neuralgia and neuritis, unspecified: Secondary | ICD-10-CM

## 2019-10-20 NOTE — Progress Notes (Signed)
Office Visit Note  Patient: Deborah Navarro             Date of Birth: [birth year]           MRN: [id number]             PCP: [name], MD Referring: [name], MD Visit Date: 10/30/2019 Occupation: @GUAROCC @  Subjective:  Pain in hands and feet.   History of Present Illness: Deborah Navarro is a 39 y.o. female seen in consultation per request of her PCP.  According to patient her symptoms are started about 2 to 3 years ago with increased joint pain and fatigue.  She states she used to work full-time at [employer], clean houses and also plays softball.  She started experiencing fatigue at the time and the fatigue gradually got worse.  She also started experiencing pain in her hands and feet.  She states she was seen by her PCP at the time and had some further work-up which showed B12 deficiency and vitamin D deficiency which was treated.  About 1-1/2-year ago she was seen by Dr. Gwynneth Aliment and had positive ANA for that she was referred to a rheumatologist in [facility] who did work-up and had nothing to offer.  She states she continues to have pain and discomfort in her bilateral shoulders especially at nighttime.  She also has discomfort in her hips at nighttime when she sleeps on her side.  She complains of pain in her hands and feet but no swelling.  There is no history of oral ulcers, nasal ulcers, malar rash, photosensitivity, Raynaud's phenomenon, and lymphadenopathy.  She has noticed pedal edema.  There is no family history of autoimmune disease.  She denies any joint swelling.  She is gravida 2 para 2  miscarriages 1.  Activities of Daily Living:  Patient reports morning stiffness for 5  minutes.   Patient Reports nocturnal pain.  Difficulty dressing/grooming: Denies Difficulty climbing stairs: Denies Difficulty getting out of chair: Denies Difficulty using hands for taps, buttons, cutlery, and/or writing: Reports  Review of Systems  Constitutional: Positive for  fatigue. Negative for night sweats, weight gain and weight loss.  HENT: Positive for mouth dryness. Negative for mouth sores, trouble swallowing, trouble swallowing and nose dryness.   Eyes: Negative for pain, redness, itching, visual disturbance and dryness.  Respiratory: Negative for cough, shortness of breath and difficulty breathing.   Cardiovascular: Negative for chest pain, palpitations, hypertension, irregular heartbeat and swelling in legs/feet.  Gastrointestinal: Positive for constipation. Negative for blood in stool and diarrhea.  Endocrine: Negative for increased urination.  Genitourinary: Negative for difficulty urinating and vaginal dryness.  Musculoskeletal: Positive for arthralgias, joint pain, myalgias, morning stiffness, muscle tenderness and myalgias. Negative for joint swelling and muscle weakness.  Skin: Negative for color change, rash, hair loss, redness, skin tightness, ulcers and sensitivity to sunlight.  Allergic/Immunologic: Negative for susceptible to infections.  Neurological: Positive for weakness. Negative for dizziness, numbness, headaches, memory loss and night sweats.  Hematological: Negative for bruising/bleeding tendency and swollen glands.  Psychiatric/Behavioral: Positive for depressed mood and sleep disturbance. Negative for confusion. The patient is nervous/anxious.     PMFS History:  Patient Active Problem List   Diagnosis Date Noted  . Irritable bowel syndrome (IBS) 09/26/2019  . Mood disorder ([place]) 08/05/2018  . Papanicolaou smear of cervix with positive high risk human papilloma virus (HPV) test 05/04/2018  . False positive ana 04/28/2018  . Pain in both feet 04/28/2018  . Facial rash  04/07/2018  . Chronic fatigue 04/07/2018  . Chronic neck pain 03/27/2016  . Myalgia 03/27/2016  . Left-sided headache 03/27/2016  . Family history of early CAD 03/15/2015  . History of colon polyps 06/30/2010  . History of anemia 06/30/2010  . Depression, major,  in remission ([place]) 06/30/2010  . Endometriosis 06/30/2010  . CARDIAC MURMUR 06/30/2010  . History of chicken pox 06/30/2010    Past Medical History:  Diagnosis Date  . Anemia, unspecified   . ASD (atrial septal defect)    repaired 1996  . Blood transfusion, without reported diagnosis   . Depressive disorder, not elsewhere classified   . Elevated blood pressure reading without diagnosis of hypertension   . Endometriosis of fallopian tube   . Family history of adverse reaction to anesthesia    Father - PONV  . Irritable bowel syndrome   . PONV (postoperative nausea and vomiting)   . Streptococcal meningitis   . Undiagnosed cardiac murmurs   . Unspecified personal history presenting hazards to health   . Wears contact lenses     Family History  Problem Relation Age of Onset  . Cancer Mother 80       non hodgkins lymphoma(mets to brain)  . Coronary artery disease Father   . Hypertension Father   . Hyperlipidemia Father   . Diabetes Father   . Diabetes Maternal Grandmother   . Heart attack Maternal Grandfather   . Other Maternal Grandfather        Deterioration of Jaw  . Stroke Paternal Grandmother   . Stroke Paternal Grandfather   . Healthy Son    Past Surgical History:  Procedure Laterality Date  . COLONOSCOPY WITH PROPOFOL N/A 05/16/2018   Procedure: COLONOSCOPY WITH PROPOFOL;  Surgeon: [name], MD;  Location: [facility];  Service: Endoscopy;  Laterality: N/A;  . DILATION AND CURETTAGE OF UTERUS    . LAPAROSCOPIC ENDOMETRIOSIS FULGURATION  10-1999   endometriosis  . NECK SURGERY N/A 06/14/15   due to C5/6 C6-7 disc herniation   . open heart surgery  12-1994   asd repair   Social History   Social History Narrative   Working 3 jobs currently   Immunization History  Administered Date(s) Administered  . Influenza Split 02/26/2011  . Influenza,inj,Quad PF,6+ Mos 03/15/2015, 03/23/2018  . Influenza-Unspecified 03/23/2019  . Tdap 03/15/2015      Objective: Vital Signs: BP 132/88 (BP Location: Right Arm, Patient Position: Sitting, Cuff Size: Normal)   Pulse 75   Resp 14   Ht 5' 3.5" (1.613 m)   Wt 185 lb (83.9 kg)   BMI 32.26 kg/m    Physical Exam Vitals and nursing note reviewed.  Constitutional:      Appearance: She is well-developed.  HENT:     Head: Normocephalic and atraumatic.  Eyes:     Conjunctiva/sclera: Conjunctivae normal.  Cardiovascular:     Rate and Rhythm: Normal rate and regular rhythm.     Heart sounds: Normal heart sounds.  Pulmonary:     Effort: Pulmonary effort is normal.     Breath sounds: Normal breath sounds.  Abdominal:     General: Bowel sounds are normal.     Palpations: Abdomen is soft.  Musculoskeletal:     Cervical back: Normal range of motion.  Lymphadenopathy:     Cervical: No cervical adenopathy.  Skin:    General: Skin is warm and dry.     Capillary Refill: Capillary refill takes less than 2 seconds.  Neurological:  Mental Status: She is alert and oriented to person, place, and time.  Psychiatric:        Behavior: Behavior normal.      Musculoskeletal Exam: C-spine thoracic and lumbar spine were in good range of motion with no tenderness.  Shoulder joints, elbow joints with good range of motion.  Wrist joints, MCPs, PIPs and DIPs with good range of motion with no synovitis.  She has some tenderness across her PIPs.  Hip joints, knee joints, ankles, MTPs and PIPs with good range of motion.  She has some tenderness across her MTPs and PIPs but no synovitis was noted.  CDAI Exam: CDAI Score: -- Patient Global: --; Provider Global: -- Swollen: --; Tender: -- Joint Exam 10/30/2019   No joint exam has been documented for this visit   There is currently no information documented on the homunculus. Go to the Rheumatology activity and complete the homunculus joint exam.  Investigation: No additional findings.  Imaging: No results found.  Recent Labs: Lab Results   Component Value Date   WBC 5.5 08/15/2019   HGB 11.9 08/15/2019   PLT 255 08/15/2019   NA 139 08/15/2019   K 4.4 08/15/2019   CL 104 08/15/2019   CO2 27 08/15/2019   GLUCOSE 84 08/15/2019   BUN 14 08/15/2019   CREATININE 0.83 08/15/2019   BILITOT 0.3 08/15/2019   ALKPHOS 59 03/23/2019   AST 14 08/15/2019   ALT 10 08/15/2019   PROT 7.1 08/15/2019   ALBUMIN 4.1 03/23/2019   CALCIUM 8.9 08/15/2019   GFRAA 102 08/15/2019    Speciality Comments: No specialty comments available.  Procedures:  No procedures performed Allergies: Patient has no known allergies.   Assessment / Plan:     Visit Diagnoses: Positive ANA (antinuclear antibody) - 08/15/19: ANA 1:80NS, RF<14, CCP<16, ESR 14, CRP 0.7, TSH 1.35, vitamin B12>2,000 -patient has positive ANA she gives history of sicca symptoms.  She also gives history of intermittent rash in her face.  No malar rash was noted today.  Will obtain additional labs today.  Plan: ANA, Anti-scleroderma antibody, RNP Antibody, Anti-Smith antibody, Sjogrens syndrome-A extractable nuclear antibody, Anti-DNA antibody, double-stranded, Sjogrens syndrome-B extractable nuclear antibody, C3 and C4, Beta-2 glycoprotein antibodies, Cardiolipin antibodies, IgG, IgM, IgA, Lupus Anticoagulant Eval w/Reflex  Pain in both hands -she complains of pain and discomfort in her bilateral hands.  No synovitis was noted.  Plan: XR Hand 2 View Right, XR Hand 2 View Left, x-ray showed mild osteoarthritic changes.  A handout on hand exercises was given. atural anti-inflammatories were discussed.  Sedimentation rate, 14-3-3 eta Protein, HLA-B27 antigen, Uric acid  Pain in both feet -she complains of pain and discomfort in her feet.  No swelling or synovitis was noted.  Plan: XR Foot 2 Views Right, XR Foot 2 Views Left.  X-ray showed mild osteoarthritic changes.  Chronic neck pain-improved  Myalgia -she complains of fatigue and muscle pain off and on.  Plan: CK  Chronic fatigue-she  gives history of vitamin B12 and vitamin D deficiency which has been treated.  Irritable bowel syndrome with constipation-mostly constipation.  Depression, major, in remission ([place])  Papanicolaou smear of cervix with positive high risk human papilloma virus (HPV) test  Orders: Orders Placed This Encounter  Procedures  . XR Hand 2 View Right  . XR Hand 2 View Left  . XR Foot 2 Views Right  . XR Foot 2 Views Left  . CK  . Sedimentation rate  . 14-3-3 eta Protein  .  ANA  . Anti-scleroderma antibody  . RNP Antibody  . Anti-Smith antibody  . Sjogrens syndrome-A extractable nuclear antibody  . Anti-DNA antibody, double-stranded  . Sjogrens syndrome-B extractable nuclear antibody  . C3 and C4  . Beta-2 glycoprotein antibodies  . Cardiolipin antibodies, IgG, IgM, IgA  . Lupus Anticoagulant Eval w/Reflex  . HLA-B27 antigen  . Uric acid   No orders of the defined types were placed in this encounter.   Face-to-face time spent with patient was 45 minutes. Greater than 50% of time was spent in counseling and coordination of care.  Follow-Up Instructions: Return for Polyarthralgia.   Bo Merino, MD  Note - This record has been created using Editor, commissioning.  Chart creation errors have been sought, but may not always  have been located. Such creation errors do not reflect on  the standard of medical care.

## 2019-10-30 ENCOUNTER — Other Ambulatory Visit: Payer: Self-pay

## 2019-10-30 ENCOUNTER — Ambulatory Visit: Payer: Self-pay

## 2019-10-30 ENCOUNTER — Ambulatory Visit (INDEPENDENT_AMBULATORY_CARE_PROVIDER_SITE_OTHER): Payer: BC Managed Care – PPO | Admitting: Rheumatology

## 2019-10-30 ENCOUNTER — Encounter: Payer: Self-pay | Admitting: Rheumatology

## 2019-10-30 VITALS — BP 132/88 | HR 75 | Resp 14 | Ht 63.5 in | Wt 185.0 lb

## 2019-10-30 DIAGNOSIS — M79671 Pain in right foot: Secondary | ICD-10-CM

## 2019-10-30 DIAGNOSIS — M791 Myalgia, unspecified site: Secondary | ICD-10-CM | POA: Diagnosis not present

## 2019-10-30 DIAGNOSIS — M79642 Pain in left hand: Secondary | ICD-10-CM

## 2019-10-30 DIAGNOSIS — K581 Irritable bowel syndrome with constipation: Secondary | ICD-10-CM

## 2019-10-30 DIAGNOSIS — M79672 Pain in left foot: Secondary | ICD-10-CM

## 2019-10-30 DIAGNOSIS — R87810 Cervical high risk human papillomavirus (HPV) DNA test positive: Secondary | ICD-10-CM

## 2019-10-30 DIAGNOSIS — R768 Other specified abnormal immunological findings in serum: Secondary | ICD-10-CM

## 2019-10-30 DIAGNOSIS — M542 Cervicalgia: Secondary | ICD-10-CM | POA: Diagnosis not present

## 2019-10-30 DIAGNOSIS — M79641 Pain in right hand: Secondary | ICD-10-CM

## 2019-10-30 DIAGNOSIS — F325 Major depressive disorder, single episode, in full remission: Secondary | ICD-10-CM

## 2019-10-30 DIAGNOSIS — R5382 Chronic fatigue, unspecified: Secondary | ICD-10-CM

## 2019-10-30 DIAGNOSIS — G8929 Other chronic pain: Secondary | ICD-10-CM

## 2019-10-30 DIAGNOSIS — R21 Rash and other nonspecific skin eruption: Secondary | ICD-10-CM

## 2019-10-30 NOTE — Patient Instructions (Signed)
Hand Exercises Hand exercises can be helpful for almost anyone. These exercises can strengthen the hands, improve flexibility and movement, and increase blood flow to the hands. These results can make work and daily tasks easier. Hand exercises can be especially helpful for people who have joint pain from arthritis or have nerve damage from overuse (carpal tunnel syndrome). These exercises can also help people who have injured a hand. Exercises Most of these hand exercises are gentle stretching and motion exercises. It is usually safe to do them often throughout the day. Warming up your hands before exercise may help to reduce stiffness. You can do this with gentle massage or by placing your hands in warm water for 10-15 minutes. It is normal to feel some stretching, pulling, tightness, or mild discomfort as you begin new exercises. This will gradually improve. Stop an exercise right away if you feel sudden, severe pain or your pain gets worse. Ask your health care provider which exercises are best for you. Knuckle bend or "claw" fist 1. Stand or sit with your arm, hand, and all five fingers pointed straight up. Make sure to keep your wrist straight during the exercise. 2. Gently bend your fingers down toward your palm until the tips of your fingers are touching the top of your palm. Keep your big knuckle straight and just bend the small knuckles in your fingers. 3. Hold this position for __________ seconds. 4. Straighten (extend) your fingers back to the starting position. Repeat this exercise 5-10 times with each hand. Full finger fist 1. Stand or sit with your arm, hand, and all five fingers pointed straight up. Make sure to keep your wrist straight during the exercise. 2. Gently bend your fingers into your palm until the tips of your fingers are touching the middle of your palm. 3. Hold this position for __________ seconds. 4. Extend your fingers back to the starting position, stretching every  joint fully. Repeat this exercise 5-10 times with each hand. Straight fist 1. Stand or sit with your arm, hand, and all five fingers pointed straight up. Make sure to keep your wrist straight during the exercise. 2. Gently bend your fingers at the big knuckle, where your fingers meet your hand, and the middle knuckle. Keep the knuckle at the tips of your fingers straight and try to touch the bottom of your palm. 3. Hold this position for __________ seconds. 4. Extend your fingers back to the starting position, stretching every joint fully. Repeat this exercise 5-10 times with each hand. Tabletop 1. Stand or sit with your arm, hand, and all five fingers pointed straight up. Make sure to keep your wrist straight during the exercise. 2. Gently bend your fingers at the big knuckle, where your fingers meet your hand, as far down as you can while keeping the small knuckles in your fingers straight. Think of forming a tabletop with your fingers. 3. Hold this position for __________ seconds. 4. Extend your fingers back to the starting position, stretching every joint fully. Repeat this exercise 5-10 times with each hand. Finger spread 1. Place your hand flat on a table with your palm facing down. Make sure your wrist stays straight as you do this exercise. 2. Spread your fingers and thumb apart from each other as far as you can until you feel a gentle stretch. Hold this position for __________ seconds. 3. Bring your fingers and thumb tight together again. Hold this position for __________ seconds. Repeat this exercise 5-10 times with each hand.   Making circles 1. Stand or sit with your arm, hand, and all five fingers pointed straight up. Make sure to keep your wrist straight during the exercise. 2. Make a circle by touching the tip of your thumb to the tip of your index finger. 3. Hold for __________ seconds. Then open your hand wide. 4. Repeat this motion with your thumb and each finger on your  hand. Repeat this exercise 5-10 times with each hand. Thumb motion 1. Sit with your forearm resting on a table and your wrist straight. Your thumb should be facing up toward the ceiling. Keep your fingers relaxed as you move your thumb. 2. Lift your thumb up as high as you can toward the ceiling. Hold for __________ seconds. 3. Bend your thumb across your palm as far as you can, reaching the tip of your thumb for the small finger (pinkie) side of your palm. Hold for __________ seconds. Repeat this exercise 5-10 times with each hand. Grip strengthening  1. Hold a stress ball or other soft ball in the middle of your hand. 2. Slowly increase the pressure, squeezing the ball as much as you can without causing pain. Think of bringing the tips of your fingers into the middle of your palm. All of your finger joints should bend when doing this exercise. 3. Hold your squeeze for __________ seconds, then relax. Repeat this exercise 5-10 times with each hand. Contact a health care provider if:  Your hand pain or discomfort gets much worse when you do an exercise.  Your hand pain or discomfort does not improve within 2 hours after you exercise. If you have any of these problems, stop doing these exercises right away. Do not do them again unless your health care provider says that you can. Get help right away if:  You develop sudden, severe hand pain or swelling. If this happens, stop doing these exercises right away. Do not do them again unless your health care provider says that you can. This information is not intended to replace advice given to you by your health care provider. Make sure you discuss any questions you have with your health care provider. Document Revised: 09/22/2018 Document Reviewed: 06/02/2018 [employer]vier Patient Education  2020 [employer]  

## 2019-11-03 LAB — ANTI-SMITH ANTIBODY: ENA SM Ab Ser-aCnc: <1 AI

## 2019-11-03 LAB — LUPUS ANTICOAGULANT EVAL W/ REFLEX
PTT-LA Screen: 32 s (ref <=40)
dRVVT: 38 s (ref <=45)

## 2019-11-03 LAB — CARDIOLIPIN ANTIBODIES, IGG, IGM, IGA
Anticardiolipin IgA: <11 [APL'U]
Anticardiolipin IgG: <14 [GPL'U]
Anticardiolipin IgM: <12 [MPL'U]

## 2019-11-03 LAB — URIC ACID: Uric Acid, Serum: 4.4 mg/dL (ref 2.5–7.0)

## 2019-11-03 LAB — SJOGRENS SYNDROME-A EXTRACTABLE NUCLEAR ANTIBODY: SSA (Ro) (ENA) Antibody, IgG: <1 AI

## 2019-11-03 LAB — RNP ANTIBODY: Ribonucleic Protein(ENA) Antibody, IgG: <1 AI

## 2019-11-03 LAB — 14-3-3 ETA PROTEIN: 14-3-3 eta Protein: <0.2 ng/mL (ref <0.2)

## 2019-11-03 LAB — HLA-B27 ANTIGEN: HLA-B27 Antigen: NEGATIVE

## 2019-11-03 LAB — SJOGRENS SYNDROME-B EXTRACTABLE NUCLEAR ANTIBODY: SSB (La) (ENA) Antibody, IgG: <1 AI

## 2019-11-03 LAB — C3 AND C4
C3 Complement: 116 mg/dL (ref 83–193)
C4 Complement: 14 mg/dL — ABNORMAL LOW (ref 15–57)

## 2019-11-03 LAB — CK: Total CK: 150 U/L — ABNORMAL HIGH (ref 29–143)

## 2019-11-03 LAB — BETA-2 GLYCOPROTEIN ANTIBODIES
Beta-2 Glyco 1 IgA: <9 SAU (ref <=20)
Beta-2 Glyco 1 IgM: <9 SMU (ref <=20)
Beta-2 Glyco I IgG: <9 SGU (ref <=20)

## 2019-11-03 LAB — ANTI-DNA ANTIBODY, DOUBLE-STRANDED: ds DNA Ab: 1 IU/mL

## 2019-11-03 LAB — ANTI-NUCLEAR AB-TITER (ANA TITER): ANA Titer 1: 1:80 {titer} — ABNORMAL HIGH

## 2019-11-03 LAB — SEDIMENTATION RATE: Sed Rate: 9 mm/h (ref 0–20)

## 2019-11-03 LAB — ANTI-SCLERODERMA ANTIBODY: Scleroderma (Scl-70) (ENA) Antibody, IgG: <1 AI

## 2019-11-03 LAB — ANA: Anti Nuclear Antibody (ANA): POSITIVE — AB

## 2019-11-05 NOTE — Progress Notes (Signed)
I will discuss results at the fu visit.

## 2019-11-12 NOTE — Progress Notes (Signed)
Office Visit Note  Patient: Deborah Navarro             Date of Birth: Feb 17, 1981           MRN: [id number]             PCP: [name], MD Referring: [name], MD Visit Date: 11/22/2019 Occupation: '@GUAROCC'$ @  Subjective:  Dry mouth and dry eyes   History of Present Illness: Deborah Navarro is a 39 y.o. female with history of positive ANA, sicca symptoms and photosensitivity.  She states she continues to have photosensitivity.  She has been having a lot of redness on her face and her neck.  She denies any joint swelling.  She states she has some muscle spasms and cramps in her hands at times.  She also has fibromyalgia which has been better since she has been on Lyrica and Mobic combination.  Activities of Daily Living:  Patient reports morning stiffness for  5 minutes.   Patient Reports nocturnal pain.  Difficulty dressing/grooming: Denies Difficulty climbing stairs: Denies Difficulty getting out of chair: Denies Difficulty using hands for taps, buttons, cutlery, and/or writing: Denies  Review of Systems  Constitutional: Positive for fatigue. Negative for night sweats, weight gain and weight loss.  HENT: Positive for mouth dryness. Negative for mouth sores, trouble swallowing, trouble swallowing and nose dryness.   Eyes: Negative for pain, redness, itching, visual disturbance and dryness.  Respiratory: Negative for cough, shortness of breath and difficulty breathing.   Cardiovascular: Negative for chest pain, palpitations, hypertension, irregular heartbeat and swelling in legs/feet.  Gastrointestinal: Negative for blood in stool, constipation and diarrhea.  Endocrine: Negative for increased urination.  Genitourinary: Negative for difficulty urinating, painful urination and vaginal dryness.  Musculoskeletal: Positive for arthralgias, joint pain and morning stiffness. Negative for joint swelling, myalgias, muscle weakness, muscle tenderness and myalgias.   Skin: Positive for sensitivity to sunlight. Negative for color change, rash, hair loss, redness, skin tightness and ulcers.  Allergic/Immunologic: Negative for susceptible to infections.  Neurological: Negative for dizziness, numbness, headaches, memory loss, night sweats and weakness.  Hematological: Negative for bruising/bleeding tendency and swollen glands.  Psychiatric/Behavioral: Positive for sleep disturbance. Negative for depressed mood and confusion. The patient is not nervous/anxious.     PMFS History:  Patient Active Problem List   Diagnosis Date Noted  . Irritable bowel syndrome (IBS) 09/26/2019  . Mood disorder ([place]) 08/05/2018  . Papanicolaou smear of cervix with positive high risk human papilloma virus (HPV) test 05/04/2018  . False positive ana 04/28/2018  . Pain in both feet 04/28/2018  . Facial rash 04/07/2018  . Chronic fatigue 04/07/2018  . Chronic neck pain 03/27/2016  . Myalgia 03/27/2016  . Left-sided headache 03/27/2016  . Family history of early CAD 03/15/2015  . History of colon polyps 06/30/2010  . History of anemia 06/30/2010  . Depression, major, in remission ([place]) 06/30/2010  . Endometriosis 06/30/2010  . CARDIAC MURMUR 06/30/2010  . History of chicken pox 06/30/2010    Past Medical History:  Diagnosis Date  . Anemia, unspecified   . ASD (atrial septal defect)    repaired 1996  . Blood transfusion, without reported diagnosis   . Depressive disorder, not elsewhere classified   . Elevated blood pressure reading without diagnosis of hypertension   . Endometriosis of fallopian tube   . Family history of adverse reaction to anesthesia    Father - PONV  . Irritable bowel syndrome   . PONV (postoperative nausea and  vomiting)   . Streptococcal meningitis   . Undiagnosed cardiac murmurs   . Unspecified personal history presenting hazards to health   . Wears contact lenses     Family History  Problem Relation Age of Onset  . Cancer Mother 36        non hodgkins lymphoma(mets to brain)  . Coronary artery disease Father   . Hypertension Father   . Hyperlipidemia Father   . Diabetes Father   . Diabetes Maternal Grandmother   . Heart attack Maternal Grandfather   . Other Maternal Grandfather        Deterioration of Jaw  . Stroke Paternal Grandmother   . Stroke Paternal Grandfather   . Healthy Son    Past Surgical History:  Procedure Laterality Date  . COLONOSCOPY WITH PROPOFOL N/A 05/16/2018   Procedure: COLONOSCOPY WITH PROPOFOL;  Surgeon: [name], MD;  Location: [place];  Service: Endoscopy;  Laterality: N/A;  . DILATION AND CURETTAGE OF UTERUS    . LAPAROSCOPIC ENDOMETRIOSIS FULGURATION  10-1999   endometriosis  . NECK SURGERY N/A 06/14/15   due to C5/6 C6-7 disc herniation   . open heart surgery  12-1994   asd repair   Social History   Social History Narrative   Working 3 jobs currently   Immunization History  Administered Date(s) Administered  . Influenza Split 02/26/2011  . Influenza,inj,Quad PF,6+ Mos 03/15/2015, 03/23/2018  . Influenza-Unspecified 03/23/2019  . Tdap 03/15/2015     Objective: Vital Signs: BP 114/73 (BP Location: Left Arm, Patient Position: Sitting, Cuff Size: Normal)   Pulse 73   Resp 15   Ht '5\' 3"'$  (1.6 m)   Wt 185 lb 6.4 oz (84.1 kg)   BMI 32.84 kg/m    Physical Exam Vitals and nursing note reviewed.  Constitutional:      Appearance: She is well-developed.  HENT:     Head: Normocephalic and atraumatic.  Eyes:     Conjunctiva/sclera: Conjunctivae normal.  Cardiovascular:     Rate and Rhythm: Normal rate and regular rhythm.     Heart sounds: Normal heart sounds.  Pulmonary:     Effort: Pulmonary effort is normal.     Breath sounds: Normal breath sounds.  Abdominal:     General: Bowel sounds are normal.     Palpations: Abdomen is soft.  Musculoskeletal:     Cervical back: Normal range of motion.  Lymphadenopathy:     Cervical: No cervical adenopathy.  Skin:     General: Skin is warm and dry.     Capillary Refill: Capillary refill takes less than 2 seconds.  Neurological:     Mental Status: She is alert and oriented to person, place, and time.  Psychiatric:        Behavior: Behavior normal.      Musculoskeletal Exam: C-spine thoracic and lumbar spine with good range of motion.  Shoulder joints, elbow joints, wrist joints, MCPs PIPs and DIPs with good range of motion with no synovitis.  Hip joints, knee joints, ankles, MTPs and PIPs with good range of motion with no synovitis. CDAI Exam: CDAI Score: -- Patient Global: --; Provider Global: -- Swollen: --; Tender: -- Joint Exam 11/22/2019   No joint exam has been documented for this visit   There is currently no information documented on the homunculus. Go to the Rheumatology activity and complete the homunculus joint exam.  Investigation: No additional findings.  Imaging: XR Foot 2 Views Left  Result Date: 10/30/2019 Minimal PIP and  DIP narrowing was noted.  No MTP or  intertarsal or tibiotalar joint space narrowing was noted.  Small calcaneal spur was noted.  No erosive changes were noted. Impression: Mild early osteoarthritic changes were noted in the foot.   XR Foot 2 Views Right  Result Date: 10/30/2019 Minimal PIP and DIP narrowing was noted.  No MTP or  intertarsal or tibiotalar joint space narrowing was noted.  Small calcaneal spur was noted.  No erosive changes were noted. Impression: Mild early osteoarthritic changes were noted in the foot.   XR Hand 2 View Left  Result Date: 10/30/2019 Minimal PIP narrowing was noted.  No DIP, MCP, intercarpal radiocarpal joint space narrowing was noted.  No erosive changes were noted. Impression: These findings are consistent with mild osteoarthritis of the hand.  XR Hand 2 View Right  Result Date: 10/30/2019 Minimal PIP narrowing was noted.  No MCP, DIP, intercarpal radiocarpal joint space narrowing was noted.  No erosive changes were noted.  Impression: These findings are consistent mild osteoarthritis of the hand.   Recent Labs: Lab Results  Component Value Date   WBC 5.5 08/15/2019   HGB 11.9 08/15/2019   PLT 255 08/15/2019   NA 139 08/15/2019   K 4.4 08/15/2019   CL 104 08/15/2019   CO2 27 08/15/2019   GLUCOSE 84 08/15/2019   BUN 14 08/15/2019   CREATININE 0.83 08/15/2019   BILITOT 0.3 08/15/2019   ALKPHOS 59 03/23/2019   AST 14 08/15/2019   ALT 10 08/15/2019   PROT 7.1 08/15/2019   ALBUMIN 4.1 03/23/2019   CALCIUM 8.9 08/15/2019   GFRAA 102 08/15/2019  Oct 30, 2019 ANA 1: 80NS, ENA negative, C3 normal, C4 low at 14, ESR 9, lupus anticoagulant negative, anticardiolipin negative, beta-2 GP 1 -, '14 3 3 '$ eta negative, HLA-B27 negative, uric acid 4.4, CK 150 (mildly elevated) 08/15/19: ANA 1:80NS, RF<14, CCP<16, ESR 14, CRP 0.7, TSH 1.35, vitamin B12>2,000  Speciality Comments: No specialty comments available.  Procedures:  No procedures performed Allergies: Patient has no known allergies.   Assessment / Plan:     Visit Diagnoses: Positive ANA (antinuclear antibody) - ANA 1: 80NS, ENA negative, C4 low, history of sicca symptoms, photosensitivity.  She has no other clinical features of autoimmune disease.  ENA panel was negative.  I have advised her to contact me in case she develops increased symptoms.  Over-the-counter products for sicca symptoms are discussed.  I believe some of the symptoms could be coming from the medications she is using.  I offered use of pilocarpine but she declined.  All the labs were discussed at length.  Primary osteoarthritis of both hands - Mild.  Joint protection muscle strengthening was discussed.    Primary osteoarthritis of both feet - Mild osteoarthritic changes were noted on the x-rays.  Fibromyalgia - CK is mildly elevated, not significant.  Her symptoms have improved on Mobic and Lyrica combination.  Chronic fatigue  Irritable bowel syndrome with constipation  Depression,  major, in remission ([place])  Papanicolaou smear of cervix with positive high risk human papilloma virus (HPV) test  Orders: No orders of the defined types were placed in this encounter.  No orders of the defined types were placed in this encounter.     Follow-Up Instructions: Return in about 1 year (around 11/21/2020) for +ANA, sicca.   Bo Merino, MD  Note - This record has been created using Editor, commissioning.  Chart creation errors have been sought, but may not always  have  been located. Such creation errors do not reflect on  the standard of medical care.

## 2019-11-22 ENCOUNTER — Encounter: Payer: Self-pay | Admitting: Rheumatology

## 2019-11-22 ENCOUNTER — Ambulatory Visit (INDEPENDENT_AMBULATORY_CARE_PROVIDER_SITE_OTHER): Payer: BC Managed Care – PPO | Admitting: Rheumatology

## 2019-11-22 ENCOUNTER — Other Ambulatory Visit: Payer: Self-pay

## 2019-11-22 VITALS — BP 114/73 | HR 73 | Resp 15 | Ht 63.0 in | Wt 185.4 lb

## 2019-11-22 DIAGNOSIS — K581 Irritable bowel syndrome with constipation: Secondary | ICD-10-CM

## 2019-11-22 DIAGNOSIS — M19071 Primary osteoarthritis, right ankle and foot: Secondary | ICD-10-CM

## 2019-11-22 DIAGNOSIS — R768 Other specified abnormal immunological findings in serum: Secondary | ICD-10-CM | POA: Diagnosis not present

## 2019-11-22 DIAGNOSIS — M19041 Primary osteoarthritis, right hand: Secondary | ICD-10-CM | POA: Diagnosis not present

## 2019-11-22 DIAGNOSIS — M797 Fibromyalgia: Secondary | ICD-10-CM

## 2019-11-22 DIAGNOSIS — R5382 Chronic fatigue, unspecified: Secondary | ICD-10-CM

## 2019-11-22 DIAGNOSIS — F325 Major depressive disorder, single episode, in full remission: Secondary | ICD-10-CM

## 2019-11-22 DIAGNOSIS — M19072 Primary osteoarthritis, left ankle and foot: Secondary | ICD-10-CM

## 2019-11-22 DIAGNOSIS — M19042 Primary osteoarthritis, left hand: Secondary | ICD-10-CM

## 2019-11-22 DIAGNOSIS — R87810 Cervical high risk human papillomavirus (HPV) DNA test positive: Secondary | ICD-10-CM

## 2019-12-01 ENCOUNTER — Other Ambulatory Visit: Payer: Self-pay | Admitting: Family Medicine

## 2019-12-01 DIAGNOSIS — M79671 Pain in right foot: Secondary | ICD-10-CM

## 2019-12-19 ENCOUNTER — Other Ambulatory Visit: Payer: Self-pay | Admitting: Family Medicine

## 2019-12-19 DIAGNOSIS — E559 Vitamin D deficiency, unspecified: Secondary | ICD-10-CM

## 2019-12-19 NOTE — Telephone Encounter (Signed)
Requested medication (s) are due for refill today: yes  Requested medication (s) are on the active medication list: yes  Last refill:  09/21/2019  Future visit scheduled: yes  Notes to clinic: this refill cannot be delegated     Requested Prescriptions  Pending Prescriptions Disp Refills   Vitamin D, Ergocalciferol, (DRISDOL) 1.25 MG (50000 UNIT) CAPS capsule [Pharmacy Med Name: VITAMIN D2 50,000IU (ERGO) CAP RX] 12 capsule 1    Sig: TAKE 1 CAPSULE BY MOUTH EVERY 7 DAYS      Endocrinology:  Vitamins - Vitamin D Supplementation Failed - 12/19/2019  3:40 AM      Failed - 50,000 IU strengths are not delegated      Failed - Phosphate in normal range and within 360 days    No results found for: PHOS        Failed - Vitamin D in normal range and within 360 days    VITD  Date Value Ref Range Status  12/23/2017 16.99 (L) 30.00 - 100.00 ng/mL Final   Vit D, 25-Hydroxy  Date Value Ref Range Status  03/23/2019 20.80 (L) 30 - 100 ng/mL Final    Comment:    (NOTE) Vitamin D deficiency has been defined by the Institute of Medicine  and an Endocrine Society practice guideline as a level of serum 25-OH  vitamin D less than 20 ng/mL (1,2). The Endocrine Society went on to  further define vitamin D insufficiency as a level between 21 and 29  ng/mL (2). 1. IOM (Institute of Medicine). 2010. Dietary reference intakes for  calcium and D. [place]: [employer]. 2. Holick MF, Binkley Gazelle, Bischoff-Ferrari HA, et al. Evaluation,  treatment, and prevention of vitamin D deficiency: an Endocrine  Society clinical practice guideline, JCEM. 2011 Jul; 96(7): 1911-30. Performed at [facility], [address] [place], [place] [address]           Passed - Ca in normal range and within 360 days    Calcium  Date Value Ref Range Status  08/15/2019 8.9 8.6 - 10.2 mg/dL Final          Passed - Valid encounter within last 12 months    Recent Outpatient Visits            2 months ago Positive ANA (antinuclear antibody)   [facility] Alba Cory, MD   3 months ago Frequency of urination   [facility] Welford Roche D, MD   5 months ago Mood disorder [facility])   [facility] Alba Cory, MD   7 months ago Pain in both feet   [facility] Alba Cory, MD   9 months ago Mood disorder [facility])   [facility] Alba Cory, MD       Future Appointments             Tomorrow Alba Cory, MD [facility]   In 11 months Pollyann Savoy, MD [facility]

## 2019-12-20 ENCOUNTER — Other Ambulatory Visit: Payer: Self-pay

## 2019-12-20 ENCOUNTER — Encounter: Payer: Self-pay | Admitting: Family Medicine

## 2019-12-20 ENCOUNTER — Ambulatory Visit (INDEPENDENT_AMBULATORY_CARE_PROVIDER_SITE_OTHER): Payer: BC Managed Care – PPO | Admitting: Family Medicine

## 2019-12-20 VITALS — BP 118/74 | HR 84 | Temp 98.5°F | Resp 16 | Ht 63.0 in | Wt 192.4 lb

## 2019-12-20 DIAGNOSIS — M79671 Pain in right foot: Secondary | ICD-10-CM

## 2019-12-20 DIAGNOSIS — M797 Fibromyalgia: Secondary | ICD-10-CM

## 2019-12-20 DIAGNOSIS — M79672 Pain in left foot: Secondary | ICD-10-CM

## 2019-12-20 DIAGNOSIS — E559 Vitamin D deficiency, unspecified: Secondary | ICD-10-CM

## 2019-12-20 DIAGNOSIS — E538 Deficiency of other specified B group vitamins: Secondary | ICD-10-CM

## 2019-12-20 DIAGNOSIS — M792 Neuralgia and neuritis, unspecified: Secondary | ICD-10-CM

## 2019-12-20 DIAGNOSIS — F419 Anxiety disorder, unspecified: Secondary | ICD-10-CM

## 2019-12-20 DIAGNOSIS — R768 Other specified abnormal immunological findings in serum: Secondary | ICD-10-CM | POA: Diagnosis not present

## 2019-12-20 DIAGNOSIS — K582 Mixed irritable bowel syndrome: Secondary | ICD-10-CM

## 2019-12-20 DIAGNOSIS — F39 Unspecified mood [affective] disorder: Secondary | ICD-10-CM

## 2019-12-20 DIAGNOSIS — M25542 Pain in joints of left hand: Secondary | ICD-10-CM

## 2019-12-20 DIAGNOSIS — M25541 Pain in joints of right hand: Secondary | ICD-10-CM

## 2019-12-20 MED ORDER — CARIPRAZINE HCL 1.5 MG PO CAPS: 1.5000 mg | ORAL_CAPSULE | Freq: Every day | ORAL | 5 refills | Status: DC

## 2019-12-20 MED ORDER — MODAFINIL 100 MG PO TABS: 100.0000 mg | ORAL_TABLET | Freq: Every day | ORAL | 2 refills | Status: DC

## 2019-12-20 MED ORDER — MELOXICAM 15 MG PO TABS: 15.0000 mg | ORAL_TABLET | Freq: Every day | ORAL | 0 refills | Status: DC

## 2019-12-20 MED ORDER — PREGABALIN 200 MG PO CAPS: 200.0000 mg | ORAL_CAPSULE | Freq: Three times a day (TID) | ORAL | 1 refills | Status: DC

## 2019-12-20 NOTE — Progress Notes (Signed)
Name: Deborah Navarro   MRN: [id number]    DOB: [birth year]   Date:12/20/2019       Progress Note  Subjective  Chief Complaint  Chief Complaint  Patient presents with  . Follow-up    Mood disorder    HPI  Fatigue: B12 is back to normal but she continues to feel very tired. Explained likely multifactorial, she is waiting to see Rheumatologist next month.   Feet Pain: she states pain has been going on forabout 9 months ,she states started with pain on forefoot, goes from toes to ball of her feet, currently worse on left foot. She states burning like, sometimes aching, sometimes it gets red at the end of the day, she also has intermittent swelling that is worse in the morning. She has been taking Meloxicam. Stable   FMS: she is on Lyrica, she states she still has pain, but mostly from neuropathy on her calves and worse during the day, she is taking 200 mg TID. She states that meloxicamhas helped. She has history of elevated ANA , sed rate and CRPand sed rate. She was seen by Dr. Nonie Hoyer and had more labs done, she has OA hands and positive ANA and at this time to try tumeric, ginger root, fish oil and cherry tart supplementation.  Currently she has started on tumeric only and feels like it has helped some   Mood Disorder: . Insurance denied Vraylarinitially, she has tried Abilify, Elavil and Duloxetine but did not work well for her. She is back on Vraylar 1.5 mg and states feels tired during the day, she has been sleeping better, waking up with alarm, not in the middle of the night .She still worries about her son driving but states not as worried about things breaking down at her house. Feels tired all the time, like taking a nap, we will try adding a stimulant   IBS: long history of periods of constipation with intermittent diarrhea that is associated with bloating, and abdominal pain, no blood or weight loss. Taking levxin prn and it helps with symptoms   Patient Active  Problem List   Diagnosis Date Noted  . Irritable bowel syndrome (IBS) 09/26/2019  . Mood disorder ([place]) 08/05/2018  . Papanicolaou smear of cervix with positive high risk human papilloma virus (HPV) test 05/04/2018  . False positive ana 04/28/2018  . Pain in both feet 04/28/2018  . Facial rash 04/07/2018  . Chronic fatigue 04/07/2018  . Chronic neck pain 03/27/2016  . Myalgia 03/27/2016  . Left-sided headache 03/27/2016  . Family history of early CAD 03/15/2015  . History of colon polyps 06/30/2010  . History of anemia 06/30/2010  . Depression, major, in remission ([place]) 06/30/2010  . Endometriosis 06/30/2010  . CARDIAC MURMUR 06/30/2010  . History of chicken pox 06/30/2010    Past Surgical History:  Procedure Laterality Date  . COLONOSCOPY WITH PROPOFOL N/A 05/16/2018   Procedure: COLONOSCOPY WITH PROPOFOL;  Surgeon: [name], MD;  Location: [place];  Service: Endoscopy;  Laterality: N/A;  . DILATION AND CURETTAGE OF UTERUS    . LAPAROSCOPIC ENDOMETRIOSIS FULGURATION  10-1999   endometriosis  . NECK SURGERY N/A 06/14/15   due to C5/6 C6-7 disc herniation   . open heart surgery  12-1994   asd repair    Family History  Problem Relation Age of Onset  . Cancer Mother 32       non hodgkins lymphoma(mets to brain)  . Coronary artery disease Father   .  Hypertension Father   . Hyperlipidemia Father   . Diabetes Father   . Diabetes Maternal Grandmother   . Heart attack Maternal Grandfather   . Other Maternal Grandfather        Deterioration of Jaw  . Stroke Paternal Grandmother   . Stroke Paternal Grandfather   . Healthy Son     Social History   Tobacco Use  . Smoking status: Never Smoker  . Smokeless tobacco: Never Used  Substance Use Topics  . Alcohol use: Not Currently    Alcohol/week: 1.0 standard drink    Types: 1 Shots of liquor per week    Comment: very occasionally (couple x/yr)     Current Outpatient Medications:  .  cariprazine (VRAYLAR)  capsule, Take 1 capsule (1.5 mg total) by mouth daily., Disp: 30 capsule, Rfl: 2 .  Cyanocobalamin (B-12) 1000 MCG SUBL, Place 1 tablet under the tongue daily., Disp: 30 tablet, Rfl: 0 .  hydrOXYzine (ATARAX/VISTARIL) 10 MG tablet, Take 1 tablet (10 mg total) by mouth 3 (three) times daily as needed., Disp: 30 tablet, Rfl: 0 .  hyoscyamine (LEVSIN) 0.125 MG tablet, Take 1 tablet (0.125 mg total) by mouth every 4 (four) hours as needed., Disp: 60 tablet, Rfl: 0 .  meloxicam (MOBIC) 15 MG tablet, TAKE 1 TABLET(15 MG) BY MOUTH DAILY, Disp: 90 tablet, Rfl: 0 .  pregabalin (LYRICA) 200 MG capsule, Take 1 capsule (200 mg total) by mouth 3 (three) times daily., Disp: 90 capsule, Rfl: 2 .  TURMERIC PO, Take by mouth daily., Disp: , Rfl:  .  Vitamin D, Ergocalciferol, (DRISDOL) 1.25 MG (50000 UNIT) CAPS capsule, TAKE 1 CAPSULE BY MOUTH EVERY 7 DAYS, Disp: 12 capsule, Rfl: 1  No Known Allergies  I personally reviewed active problem list, medication list, allergies, family history, social history, health maintenance with the patient/caregiver today.   ROS  Constitutional: Negative for fever or weight change.  Respiratory: Negative for cough and shortness of breath.   Cardiovascular: Negative for chest pain or palpitations.  Gastrointestinal: Negative for abdominal pain, no bowel changes.  Musculoskeletal: Negative for gait problem or joint swelling.  Skin: Negative for rash.  Neurological: Negative for dizziness or headache.  No other specific complaints in a complete review of systems (except as listed in HPI above).  Objective  Vitals:   12/20/19 1434  BP: 118/74  Pulse: 84  Resp: 16  Temp: 98.5 F (36.9 C)  TempSrc: Temporal  SpO2: 100%  Weight: 192 lb 6.4 oz (87.3 kg)  Height: 5' 3"  (1.6 m)    Body mass index is 34.08 kg/m.  Physical Exam  Constitutional: Patient appears well-developed and well-nourished. Obese  No distress.  HEENT: head atraumatic, normocephalic, pupils equal  and reactive to light, neck supple Cardiovascular: Normal rate, regular rhythm and normal heart sounds.  No murmur heard. No BLE edema. Pulmonary/Chest: Effort normal and breath sounds normal. No respiratory distress. Abdominal: Soft.  There is no tenderness. Psychiatric: Patient has a normal mood and affect. behavior is normal. Judgment and thought content normal.  Recent Results (from the past 2160 hour(s))  CK     Status: Abnormal   Collection Time: 10/30/19  8:56 AM  Result Value Ref Range   Total CK 150 (H) 29.0 - 143.0 U/L  Sedimentation rate     Status: None   Collection Time: 10/30/19  8:56 AM  Result Value Ref Range   Sed Rate 9 0 - 20 mm/h  14-3-3 eta Protein  Status: None   Collection Time: 10/30/19  8:56 AM  Result Value Ref Range   14-3-3 eta Protein <0.2 <0.2 ng/mL    Comment: . The 14-3-3eta protein is a marker of synovial inflammation that is released into synovial fluid and peripheral blood in rheumatoid arthritis (RA) and erosive psoriatic arthritis. One in five RF and CCP seronegative early stage RA patients is found to be positive for 14-3-3eta protein. Patients with active joint RA disease have higher values of 14-3-3eta protein than those with inactive RA or psoriasis without arthritis. 14-3-3eta protein has a 93% specificity in patients with RA. Values > or = 0.2 ng/mL are elevated and indicative of RA disease or erosive psoriatic arthritis. Values >0.50 ng/mL are associated with more aggressive RA disease and poorer outcomes. Unlike RF and CCP, 14-3-3eta protein is a therapeutically modifiable marker to monitor response to therapy. A decrease in 14-3-3eta protein in response to DMARDs (disease-modifying antirheumatic drugs) and anti-TNF (tumor necrosis factor) drugs indicates better clinical outcomes; an increase is associated with  worse outcomes despite apparent clinical remission. . For further information please  visit: [email or web address].[facility].com/testcenter/testguide.action? [email or web address]=TS-RmArthPnl . This test was developed and its analytical performance characteristics have been determined by [facility] ([place]). It has not been cleared or approved by FDA. This assay has been validated pursuant to the CLIA regulations and is used for clinical purposes. .   ANA     Status: Abnormal   Collection Time: 10/30/19  8:56 AM  Result Value Ref Range   Anti Nuclear Antibody (ANA) POSITIVE (A) NEGATIVE    Comment: ANA IFA is a first line screen for detecting the presence of up to approximately 150 autoantibodies in various autoimmune diseases. A positive ANA IFA result is suggestive of autoimmune disease and reflexes to titer and pattern. Further laboratory testing may be considered if clinically indicated. . For additional information, please refer to http://education.[facility].com/faq/FAQ177 (This link is being provided for informational/ educational purposes only.) .   Anti-scleroderma antibody     Status: None   Collection Time: 10/30/19  8:56 AM  Result Value Ref Range   Scleroderma (Scl-70) (ENA) Antibody, IgG <1.0 NEG <1.0 NEG AI  RNP Antibody     Status: None   Collection Time: 10/30/19  8:56 AM  Result Value Ref Range   Ribonucleic Protein(ENA) Antibody, IgG <1.0 NEG <1.0 NEG AI  Anti-Smith antibody     Status: None   Collection Time: 10/30/19  8:56 AM  Result Value Ref Range   ENA SM Ab Ser-aCnc <1.0 NEG <1.0 NEG AI  Sjogrens syndrome-A extractable nuclear antibody     Status: None   Collection Time: 10/30/19  8:56 AM  Result Value Ref Range   SSA (Ro) (ENA) Antibody, IgG <1.0 NEG <1.0 NEG AI  Anti-DNA antibody, double-stranded     Status: None   Collection Time: 10/30/19  8:56 AM  Result Value Ref Range   ds DNA Ab 1 IU/mL    Comment:                            IU/mL       Interpretation                            < or = 4     Negative  5-9         Indeterminate                            > or = 10   Positive .   Sjogrens syndrome-B extractable nuclear antibody     Status: None   Collection Time: 10/30/19  8:56 AM  Result Value Ref Range   SSB (La) (ENA) Antibody, IgG <1.0 NEG <1.0 NEG AI  C3 and C4     Status: Abnormal   Collection Time: 10/30/19  8:56 AM  Result Value Ref Range   C3 Complement 116 83 - 193 mg/dL   C4 Complement 14 (L) 15 - 57 mg/dL  Beta-2 glycoprotein antibodies     Status: None   Collection Time: 10/30/19  8:56 AM  Result Value Ref Range   Beta-2 Glyco I IgG <9 < OR = 20 SGU   Beta-2 Glyco 1 IgM <9 < OR = 20 SMU   Beta-2 Glyco 1 IgA <9 < OR = 20 SAU  Cardiolipin antibodies, IgG, IgM, IgA     Status: None   Collection Time: 10/30/19  8:56 AM  Result Value Ref Range   Anticardiolipin IgA <11 APL    Comment:     Value       Interpretation     -----       --------------     < or = 11   Negative     12 - 20     Indeterminate     21 - 80     Low to Medium Positive     >80         High Positive      Anticardiolipin IgG <14 GPL    Comment:     Value       Interpretation     -----       --------------     < or = 14   Negative     15 - 20     Indeterminate     21 - 80     Low to Medium Positive     >80         High Positive    Anticardiolipin IgM <12 MPL    Comment:     Value       Interpretation     -----       --------------     < or = 12   Negative     13 - 20     Indeterminate     21 - 80     Low to Medium Positive     >80         High Positive . The antiphospholipid antibody syndrome (APS) is a  clinical-pathologic correlation that includes a  clinical event (e.g. thrombosis, pregnancy loss,  thrombocytopenia) and persistent positive  antiphospholipid antibodies  (IgM or IgG ACA >40 MPL/GPL, IgM or IgG anti-b2GPI antibodies or a lupus anticoagulant). International  consensus guidelines for APS suggest waiting at least  12 weeks before  retesting to confirm antibody  persistence.  The [employer]  [facility] immunological classification  criteria for systemic lupus erythematosus (SLE)  include testing for isotype IgA, which has yet to be  incorporated into APS criteria. Low level  antiphospholipid antibodies may sometimes be detected  in the setting of infection, drug therapy or aging.   Lupus Anticoagulant Eval w/Reflex  Status: None   Collection Time: 10/30/19  8:56 AM  Result Value Ref Range   Lupus Anticoagulant see note     Comment: A Lupus Anticoagulant is not detected. Marland Kitchen Reference Range:  Not Detected . For additional information, please refer to http://education.[facility].com/faq/FAQ01v2 . (This link is being provided for informational/ educational purposes only.) . Marland Kitchen This interpretation is based on the following test results. Marland Kitchen    PTT-LA Screen 32 <=40 sec   dRVVT 38 <=45 sec   PT, Mixing Interp Not Indicated   HLA-B27 antigen     Status: None   Collection Time: 10/30/19  8:56 AM  Result Value Ref Range   HLA-B27 Antigen NEGATIVE NEGATIVE  Uric acid     Status: None   Collection Time: 10/30/19  8:56 AM  Result Value Ref Range   Uric Acid, Serum 4.4 2.5 - 7.0 mg/dL    Comment: Therapeutic target for gout patients: <6.0 mg/dL .   Anti-nuclear ab-titer (ANA titer)     Status: Abnormal   Collection Time: 10/30/19  8:56 AM  Result Value Ref Range   ANA Titer 1 1:80 (H) titer    Comment: A low level ANA titer may be present in pre-clinical autoimmune diseases and normal individuals.                 Reference Range                 <1:40        Negative                 1:40-1:80    Low Antibody Level                 >1:80        Elevated Antibody Level .    ANA Pattern 1 Nuclear, Speckled (A)     Comment: Speckled pattern is associated with mixed connective tissue disease (MCTD), systemic lupus erythematosus (SLE), Sjogren's syndrome, dermatomyositis,  and  systemic sclerosis/polymyositis overlap. . AC-2,4,5,29: Speckled . International Consensus on ANA Patterns (https://www.hernandez-brewer.com/)     PHQ2/9: Depression screen [facility] 2/9 12/20/2019 09/26/2019 09/07/2019 06/27/2019 06/27/2019  Decreased Interest 0 1 0 1 1  Down, Depressed, Hopeless 0 0 0 1 1  PHQ - 2 Score 0 1 0 2 2  Altered sleeping 0 1 2 2 2   Tired, decreased energy 3 3 3 1 1   Change in appetite 0 1 0 2 2  Feeling bad or failure about yourself  0 0 0 0 0  Trouble concentrating 0 1 0 1 1  Moving slowly or fidgety/restless 0 0 0 0 0  Suicidal thoughts 0 0 0 0 0  PHQ-9 Score 3 7 5 8 8   Difficult doing work/chores Not difficult at all Somewhat difficult Not difficult at all Somewhat difficult Somewhat difficult  Some recent data might be hidden    phq 9 is negative   Fall Risk: Fall Risk  12/20/2019 09/26/2019 09/07/2019 06/27/2019 04/27/2019  Falls in the past year? 0 0 0 0 0  Number falls in past yr: 0 0 0 0 0  Injury with Fall? 0 0 0 0 0     Assessment & Plan  1. Mood disorder (HCC)  - cariprazine (VRAYLAR) capsule; Take 1 capsule (1.5 mg total) by mouth daily.  Dispense: 30 capsule; Refill: 5 - pregabalin (LYRICA) 200 MG capsule; Take 1 capsule (200 mg total) by mouth 3 (three) times daily.  Dispense: 270 capsule; Refill:  1  2. Positive ANA (antinuclear antibody)  Seeing Rheumatologist   3. Vitamin D deficiency   4. Fibromyalgia syndrome  - pregabalin (LYRICA) 200 MG capsule; Take 1 capsule (200 mg total) by mouth 3 (three) times daily.  Dispense: 270 capsule; Refill: 1 - meloxicam (MOBIC) 15 MG tablet; Take 1 tablet (15 mg total) by mouth daily.  Dispense: 90 tablet; Refill: 0  5. Arthralgia of both hands  Stable   6. Irritable bowel syndrome with both constipation and diarrhea   7. Deficiency of vitamin B12  Continue supplementation   8. Anxiety   9. Neuropathic pain  - pregabalin (LYRICA) 200 MG capsule; Take 1 capsule (200 mg  total) by mouth 3 (three) times daily.  Dispense: 270 capsule; Refill: 1  10. Pain in both feet  Doing better

## 2019-12-25 ENCOUNTER — Telehealth: Payer: Self-pay

## 2019-12-25 NOTE — Telephone Encounter (Signed)
Prior Authorization was initiated for Modafinil 100 mg and Approved. The medication is approved through 06/22/2020.

## 2020-03-04 ENCOUNTER — Other Ambulatory Visit: Payer: Self-pay | Admitting: Family Medicine

## 2020-03-04 DIAGNOSIS — M797 Fibromyalgia: Secondary | ICD-10-CM

## 2020-03-06 ENCOUNTER — Encounter: Payer: Self-pay | Admitting: Family Medicine

## 2020-03-07 ENCOUNTER — Other Ambulatory Visit: Payer: Self-pay | Admitting: Family Medicine

## 2020-03-07 ENCOUNTER — Ambulatory Visit (INDEPENDENT_AMBULATORY_CARE_PROVIDER_SITE_OTHER): Payer: BC Managed Care – PPO | Admitting: Internal Medicine

## 2020-03-07 ENCOUNTER — Other Ambulatory Visit: Payer: Self-pay

## 2020-03-07 ENCOUNTER — Encounter: Payer: Self-pay | Admitting: Internal Medicine

## 2020-03-07 VITALS — BP 124/84 | HR 96 | Temp 98.0°F | Resp 16 | Ht 63.0 in | Wt 195.0 lb

## 2020-03-07 DIAGNOSIS — S161XXA Strain of muscle, fascia and tendon at neck level, initial encounter: Secondary | ICD-10-CM

## 2020-03-07 DIAGNOSIS — Z1231 Encounter for screening mammogram for malignant neoplasm of breast: Secondary | ICD-10-CM

## 2020-03-07 DIAGNOSIS — M5090 Cervical disc disorder, unspecified, unspecified cervical region: Secondary | ICD-10-CM | POA: Diagnosis not present

## 2020-03-07 DIAGNOSIS — M62838 Other muscle spasm: Secondary | ICD-10-CM | POA: Diagnosis not present

## 2020-03-07 MED ORDER — PREDNISONE 10 MG PO TABS: ORAL_TABLET | ORAL | 0 refills | Status: DC

## 2020-03-07 NOTE — Progress Notes (Signed)
Patient ID: Deborah Navarro, female    DOB: 08/23/1980, 39 y.o.   MRN: [id number]  PCP: [name], MD  Chief Complaint  Patient presents with  . Neck Pain    Back of the neck on her right side is painful has been for about a week now, she has a job where she does a lot of lifiting. She has history of neck surgery 5 years ago    Subjective:   Deborah Navarro is a 39 y.o. female, presents to clinic with CC of the following:  Chief Complaint  Patient presents with  . Neck Pain    Back of the neck on her right side is painful has been for about a week now, she has a job where she does a lot of lifiting. She has history of neck surgery 5 years ago    HPI:  Patient is a 39 year old female patient of Dr. Ancil Boozer Presents today with the above complaint She has a history of C5/6, C6/7 disc herniation and prior surgery.  This was about 5 years ago.  She noted that was some left-sided symptoms. For the past week, she has struggled with more right-sided neck pains, more adjacent to the spine, and at times felt down towards the trap muscle complex in the upper back.  She had no one-time trauma prior to onset, but does note she has a job where she does lifting, and worsens as she is working.  Denies any pains down the right arm, not dropping things, no weakness.  No numbness or tingling in the upper extremity. She takes meloxicam daily, and also Lyrica product daily, with the Lyrica started for fibromyalgia. She notes she has been on a steroid before and has tolerated that. She denies any history of diabetes.  Patient Active Problem List   Diagnosis Date Noted  . Irritable bowel syndrome (IBS) 09/26/2019  . Mood disorder ([place]) 08/05/2018  . Papanicolaou smear of cervix with positive high risk human papilloma virus (HPV) test 05/04/2018  . False positive ana 04/28/2018  . Pain in both feet 04/28/2018  . Facial rash 04/07/2018  . Chronic fatigue 04/07/2018  . Chronic neck  pain 03/27/2016  . Myalgia 03/27/2016  . Left-sided headache 03/27/2016  . Family history of early CAD 03/15/2015  . History of colon polyps 06/30/2010  . History of anemia 06/30/2010  . Depression, major, in remission ([place]) 06/30/2010  . Endometriosis 06/30/2010  . CARDIAC MURMUR 06/30/2010  . History of chicken pox 06/30/2010      Current Outpatient Medications:  .  cariprazine (VRAYLAR) capsule, Take 1 capsule (1.5 mg total) by mouth daily., Disp: 30 capsule, Rfl: 5 .  Cyanocobalamin (B-12) 1000 MCG SUBL, Place 1 tablet under the tongue daily., Disp: 30 tablet, Rfl: 0 .  Ginger, Zingiber officinalis, (GINGER ROOT PO), Take by mouth., Disp: , Rfl:  .  hydrOXYzine (ATARAX/VISTARIL) 10 MG tablet, Take 1 tablet (10 mg total) by mouth 3 (three) times daily as needed., Disp: 30 tablet, Rfl: 0 .  hyoscyamine (LEVSIN) 0.125 MG tablet, Take 1 tablet (0.125 mg total) by mouth every 4 (four) hours as needed., Disp: 60 tablet, Rfl: 0 .  meloxicam (MOBIC) 15 MG tablet, TAKE 1 TABLET(15 MG) BY MOUTH DAILY, Disp: 90 tablet, Rfl: 0 .  modafinil (PROVIGIL) 100 MG tablet, Take 1 tablet (100 mg total) by mouth daily., Disp: 30 tablet, Rfl: 2 .  pregabalin (LYRICA) 200 MG capsule, Take 1 capsule (200 mg total) by mouth  3 (three) times daily., Disp: 270 capsule, Rfl: 1 .  TURMERIC PO, Take by mouth daily., Disp: , Rfl:  .  Vitamin D, Ergocalciferol, (DRISDOL) 1.25 MG (50000 UNIT) CAPS capsule, TAKE 1 CAPSULE BY MOUTH EVERY 7 DAYS, Disp: 12 capsule, Rfl: 1   No Known Allergies   Past Surgical History:  Procedure Laterality Date  . COLONOSCOPY WITH PROPOFOL N/A 05/16/2018   Procedure: COLONOSCOPY WITH PROPOFOL;  Surgeon: [name], MD;  Location: [place];  Service: Endoscopy;  Laterality: N/A;  . DILATION AND CURETTAGE OF UTERUS    . LAPAROSCOPIC ENDOMETRIOSIS FULGURATION  10-1999   endometriosis  . NECK SURGERY N/A 06/14/15   due to C5/6 C6-7 disc herniation   . open heart surgery   12-1994   asd repair     Family History  Problem Relation Age of Onset  . Cancer Mother 60       non hodgkins lymphoma(mets to brain)  . Coronary artery disease Father   . Hypertension Father   . Hyperlipidemia Father   . Diabetes Father   . Diabetes Maternal Grandmother   . Heart attack Maternal Grandfather   . Other Maternal Grandfather        Deterioration of Jaw  . Stroke Paternal Grandmother   . Stroke Paternal Grandfather   . Healthy Son      Social History   Tobacco Use  . Smoking status: Never Smoker  . Smokeless tobacco: Never Used  Substance Use Topics  . Alcohol use: Not Currently    Alcohol/week: 1.0 standard drink    Types: 1 Shots of liquor per week    Comment: very occasionally (couple x/yr)    With staff assistance, above reviewed with the patient today.  ROS: As per HPI, otherwise no specific complaints on a limited and focused system review   No results found for this or any previous visit (from the past 72 hour(s)).   PHQ2/9: Depression screen [facility] 2/9 03/07/2020 12/20/2019 09/26/2019 09/07/2019 06/27/2019  Decreased Interest 1 0 1 0 1  Down, Depressed, Hopeless 0 0 0 0 1  PHQ - 2 Score 1 0 1 0 2  Altered sleeping - 0 1 2 2   Tired, decreased energy - 3 3 3 1   Change in appetite - 0 1 0 2  Feeling bad or failure about yourself  - 0 0 0 0  Trouble concentrating - 0 1 0 1  Moving slowly or fidgety/restless - 0 0 0 0  Suicidal thoughts - 0 0 0 0  PHQ-9 Score - 3 7 5 8   Difficult doing work/chores - Not difficult at all Somewhat difficult Not difficult at all Somewhat difficult  Some recent data might be hidden   PHQ-2/9 Result reviewed  Fall Risk: Fall Risk  03/07/2020 12/20/2019 09/26/2019 09/07/2019 06/27/2019  Falls in the past year? 0 0 0 0 0  Number falls in past yr: 0 0 0 0 0  Injury with Fall? 0 0 0 0 0  Follow up Falls evaluation completed - - - -      Objective:   Vitals:   03/07/20 0827  BP: 124/84  Pulse: 96  Resp: 16  Temp: 98 F  (36.7 C)  TempSrc: Oral  SpO2: 99%  Weight: 195 lb (88.5 kg)  Height: 5' 3"  (1.6 m)    Body mass index is 34.54 kg/m.  Physical Exam   NAD, masked, pleasant HEENT - [place]/AT, sclera anicteric, PERRL, positive glasses  Neck - supple,  no tenderness with palpation over the cervical spine, not tender to palpation right of the cervical spine where she feels her discomfort presently, although notes as the day progresses and symptoms worsen, it can be tender when she presses the area.  Nontender over the upper back trap muscle complex where she sometimes feels the discomfort.  Limitation in motion with left lateral flexion (noted that has been present after her prior surgery) good motion with right lateral flexion and with flexion and extension presently (notes it gets more limiting as her symptoms increase throughout the day) no adenopathy, Car - RRR without m/g/r Pulm- RR and effort normal at rest, CTA without wheeze or rales Back -nontender with palpation over the thoracic spine. Ext -good motion of the shoulder joints without pain with the Apley scratch test from above and below, good strength in the right upper extremity Neuro/psychiatric - affect was not flat, appropriate with conversation  Alert and oriented  Incision intact to light touch in the right upper extremity, good strength in the right upper extremity with no weakness.  Speech  normal   Results for orders placed or performed in visit on 10/30/19  CK  Result Value Ref Range   Total CK 150 (H) 29.0 - 143.0 U/L  Sedimentation rate  Result Value Ref Range   Sed Rate 9 0 - 20 mm/h  14-3-3 eta Protein  Result Value Ref Range   14-3-3 eta Protein <0.2 <0.2 ng/mL  ANA  Result Value Ref Range   Anti Nuclear Antibody (ANA) POSITIVE (A) NEGATIVE  Anti-scleroderma antibody  Result Value Ref Range   Scleroderma (Scl-70) (ENA) Antibody, IgG <1.0 NEG <1.0 NEG AI  RNP Antibody  Result Value Ref Range   Ribonucleic Protein(ENA)  Antibody, IgG <1.0 NEG <1.0 NEG AI  Anti-Smith antibody  Result Value Ref Range   ENA SM Ab Ser-aCnc <1.0 NEG <1.0 NEG AI  Sjogrens syndrome-A extractable nuclear antibody  Result Value Ref Range   SSA (Ro) (ENA) Antibody, IgG <1.0 NEG <1.0 NEG AI  Anti-DNA antibody, double-stranded  Result Value Ref Range   ds DNA Ab 1 IU/mL  Sjogrens syndrome-B extractable nuclear antibody  Result Value Ref Range   SSB (La) (ENA) Antibody, IgG <1.0 NEG <1.0 NEG AI  C3 and C4  Result Value Ref Range   C3 Complement 116 83 - 193 mg/dL   C4 Complement 14 (L) 15 - 57 mg/dL  Beta-2 glycoprotein antibodies  Result Value Ref Range   Beta-2 Glyco I IgG <9 < OR = 20 SGU   Beta-2 Glyco 1 IgM <9 < OR = 20 SMU   Beta-2 Glyco 1 IgA <9 < OR = 20 SAU  Cardiolipin antibodies, IgG, IgM, IgA  Result Value Ref Range   Anticardiolipin IgA <11 APL   Anticardiolipin IgG <14 GPL   Anticardiolipin IgM <12 MPL  Lupus Anticoagulant Eval w/Reflex  Result Value Ref Range   Lupus Anticoagulant see note    PTT-LA Screen 32 <=40 sec   dRVVT 38 <=45 sec   PT, Mixing Interp Not Indicated   HLA-B27 antigen  Result Value Ref Range   HLA-B27 Antigen NEGATIVE NEGATIVE  Uric acid  Result Value Ref Range   Uric Acid, Serum 4.4 2.5 - 7.0 mg/dL  Anti-nuclear ab-titer (ANA titer)  Result Value Ref Range   ANA Titer 1 1:80 (H) titer   ANA Pattern 1 Nuclear, Speckled (A)        Assessment & Plan:    1. Neck  strain, initial encounter 2. Neck muscle spasm 3. Cervical disc disease  Patient with likely a neck muscle strain and intermittent spasm.  No concerning radicular signs down the right upper extremity.  He does have a history of cervical disc disease and has had prior surgery. Felt best to proceed as follows We will add a short steroid course to help with symptoms, and prednisone prescribed Continue the meloxicam product Contrast therapy is recommended with warmth to the area, followed by range of motion exercises,  followed by cold application.  - predniSONE (DELTASONE) 10 MG tablet; Take four tabs daily X 2 days, then two tabs daily X 2 days, then one tab daily X 2 days  Dispense: 14 tablet; Refill: 0  If symptoms not improving or more problematic over time, needs to follow-up.  May need  imaging, other studies at that point and await her response.     Towanda Malkin, MD 03/07/20 8:53 AM

## 2020-03-07 NOTE — Patient Instructions (Signed)
Please pick up the prescription medication to start at the pharmacy, his prednisone.  Continue the meloxicam product  Contrast therapy is recommended with warmth to the area, followed by range of motion exercises, followed by cold application.

## 2020-03-27 ENCOUNTER — Other Ambulatory Visit: Payer: Self-pay | Admitting: Family Medicine

## 2020-04-01 ENCOUNTER — Other Ambulatory Visit: Payer: Self-pay

## 2020-04-01 ENCOUNTER — Ambulatory Visit
Admission: RE | Admit: 2020-04-01 | Discharge: 2020-04-01 | Disposition: A | Discharge status: Home / Self Care | Payer: BC Managed Care – PPO | Source: Ambulatory Visit | Attending: Family Medicine | Admitting: Family Medicine

## 2020-04-01 DIAGNOSIS — Z1231 Encounter for screening mammogram for malignant neoplasm of breast: Secondary | ICD-10-CM | POA: Insufficient documentation

## 2020-05-06 ENCOUNTER — Ambulatory Visit: Payer: BC Managed Care – PPO | Admitting: Obstetrics and Gynecology

## 2020-05-07 ENCOUNTER — Ambulatory Visit (INDEPENDENT_AMBULATORY_CARE_PROVIDER_SITE_OTHER): Payer: BC Managed Care – PPO | Admitting: Obstetrics and Gynecology

## 2020-05-07 ENCOUNTER — Other Ambulatory Visit: Payer: Self-pay

## 2020-05-07 ENCOUNTER — Other Ambulatory Visit (HOSPITAL_COMMUNITY)
Admission: RE | Admit: 2020-05-07 | Discharge: 2020-05-07 | Disposition: A | Discharge status: Home / Self Care | Payer: BC Managed Care – PPO | Source: Ambulatory Visit | Attending: Obstetrics and Gynecology | Admitting: Obstetrics and Gynecology

## 2020-05-07 ENCOUNTER — Encounter: Payer: Self-pay | Admitting: Obstetrics and Gynecology

## 2020-05-07 VITALS — BP 126/74 | Ht 63.0 in | Wt 204.0 lb

## 2020-05-07 DIAGNOSIS — R87610 Atypical squamous cells of undetermined significance on cytologic smear of cervix (ASC-US): Secondary | ICD-10-CM | POA: Insufficient documentation

## 2020-05-07 DIAGNOSIS — Z1339 Encounter for screening examination for other mental health and behavioral disorders: Secondary | ICD-10-CM | POA: Diagnosis not present

## 2020-05-07 DIAGNOSIS — Z01419 Encounter for gynecological examination (general) (routine) without abnormal findings: Secondary | ICD-10-CM | POA: Insufficient documentation

## 2020-05-07 DIAGNOSIS — R87810 Cervical high risk human papillomavirus (HPV) DNA test positive: Secondary | ICD-10-CM | POA: Insufficient documentation

## 2020-05-07 DIAGNOSIS — Z1331 Encounter for screening for depression: Secondary | ICD-10-CM

## 2020-05-07 NOTE — Progress Notes (Signed)
Gynecology Annual Exam  PCP: Alba CorySowles, Krichna, MD  Chief Complaint  Patient presents with  . Annual Exam   History of Present Illness:  Ms. Deborah Navarro is a 39 y.o. G2P0011 who LMP was Patient's last menstrual period was 04/08/2020., presents today for her annual examination.  Her menses are just about monthly, lasting 3-4 days, no intermenstrual spotting.   She is not sexually active.  Last Pap: 04/2019  Results were: atypical squamous cellularity of undetermined significance (ASCUS), HPV positive.  Colposcopy 05/2019 - CIN 1 on ECC 10/19 - NILM, HPV+  Hx of STDs: none  Last mammogram: 04/01/2020  Results were: normal--routine follow-up in 12 months There is no FH of breast cancer. There is no FH of ovarian cancer. The patient does not do self-breast exams.  Colonoscopy: has had two. Last one was 05/2019, normal DEXA: has not been screened for osteoporosis  Tobacco use: The patient denies current or previous tobacco use. Alcohol use: social drinker Exercise: no  The patient wears seatbelts: yes.     Past Medical History:  Diagnosis Date  . Anemia, unspecified   . ASD (atrial septal defect)    repaired 1996  . Blood transfusion, without reported diagnosis   . Depressive disorder, not elsewhere classified   . Elevated blood pressure reading without diagnosis of hypertension   . Endometriosis of fallopian tube   . Family history of adverse reaction to anesthesia    Father - PONV  . Irritable bowel syndrome   . PONV (postoperative nausea and vomiting)   . Streptococcal meningitis   . Undiagnosed cardiac murmurs   . Unspecified personal history presenting hazards to health   . Wears contact lenses     Past Surgical History:  Procedure Laterality Date  . COLONOSCOPY WITH PROPOFOL N/A 05/16/2018   Procedure: COLONOSCOPY WITH PROPOFOL;  Surgeon: Midge MiniumWohl, Darren, MD;  [facility];  Service: Endoscopy;  Laterality: N/A;  . DILATION AND CURETTAGE OF  UTERUS    . LAPAROSCOPIC ENDOMETRIOSIS FULGURATION  10-1999   endometriosis  . NECK SURGERY N/A 06/14/15   due to C5/6 C6-7 disc herniation   . open heart surgery  12-1994   asd repair    Prior to Admission medications   Medication Sig Start Date End Date Taking? Authorizing Provider  cariprazine (VRAYLAR) capsule Take 1 capsule (1.5 mg total) by mouth daily. 12/20/19  Yes Sowles, Danna HeftyKrichna, MD  Cyanocobalamin (B-12) 1000 MCG SUBL Place 1 tablet under the tongue daily. 12/21/18  Yes Sowles, Danna HeftyKrichna, MD  Ginger, Zingiber officinalis, (GINGER ROOT PO) Take by mouth.   Yes [provider]  hydrOXYzine (ATARAX/VISTARIL) 10 MG tablet Take 1 tablet (10 mg total) by mouth 3 (three) times daily as needed. 09/26/19  Yes Sowles, Danna HeftyKrichna, MD  hyoscyamine (LEVSIN) 0.125 MG tablet Take 1 tablet (0.125 mg total) by mouth every 4 (four) hours as needed. 09/26/19  Yes Sowles, Danna HeftyKrichna, MD  meloxicam (MOBIC) 15 MG tablet TAKE 1 TABLET(15 MG) BY MOUTH DAILY 03/04/20  Yes Sowles, Danna HeftyKrichna, MD  modafinil (PROVIGIL) 100 MG tablet TAKE 1 TABLET(100 MG) BY MOUTH DAILY 03/27/20  Yes Sowles, Danna HeftyKrichna, MD  pregabalin (LYRICA) 200 MG capsule Take 1 capsule (200 mg total) by mouth 3 (three) times daily. 12/20/19  Yes Sowles, Danna HeftyKrichna, MD  TURMERIC PO Take by mouth daily.   Yes [provider]  Vitamin D, Ergocalciferol, (DRISDOL) 1.25 MG (50000 UNIT) CAPS capsule TAKE 1 CAPSULE BY MOUTH EVERY 7 DAYS 12/19/19  Yes Sowles, Danna HeftyKrichna,  MD  predniSONE (DELTASONE) 10 MG tablet Take four tabs daily X 2 days, then two tabs daily X 2 days, then one tab daily X 2 days 03/07/20   Jamelle Haring, MD    No Known Allergies  Obstetric History: [id number]  Family History  Problem Relation Age of Onset  . Cancer Mother 80       non hodgkins lymphoma(mets to brain)  . Coronary artery disease Father   . Hypertension Father   . Hyperlipidemia Father   . Diabetes Father   . Diabetes Maternal Grandmother   . Heart attack  Maternal Grandfather   . Other Maternal Grandfather        Deterioration of Jaw  . Stroke Paternal Grandmother   . Stroke Paternal Grandfather   . Healthy Son     Social History   Socioeconomic History  . Marital status: Married    Spouse name: Asher Muir  . Number of children: 1  . Years of education: Not on file  . Highest education level: Not on file  Occupational History  . Occupation: Engineering geologist: [employer]  Tobacco Use  . Smoking status: Never Smoker  . Smokeless tobacco: Never Used  Vaping Use  . Vaping Use: Never used  Substance and Sexual Activity  . Alcohol use: Not Currently    Alcohol/week: 1.0 standard drink    Types: 1 Shots of liquor per week    Comment: very occasionally (couple x/yr)  . Drug use: No  . Sexual activity: Yes    Partners: Male    Birth control/protection: None  Other Topics Concern  . Not on file  Social History Narrative   Working 3 jobs currently   Social Determinants of Corporate investment banker Strain:   . Difficulty of Paying Living Expenses: Not on file  Food Insecurity:   . Worried About Programme researcher, broadcasting/film/video in the Last Year: Not on file  . Ran Out of Food in the Last Year: Not on file  Transportation Needs:   . Lack of Transportation (Medical): Not on file  . Lack of Transportation (Non-Medical): Not on file  Physical Activity:   . Days of Exercise per Week: Not on file  . Minutes of Exercise per Session: Not on file  Stress:   . Feeling of Stress : Not on file  Social Connections:   . Frequency of Communication with Friends and Family: Not on file  . Frequency of Social Gatherings with Friends and Family: Not on file  . Attends Religious Services: Not on file  . Active Member of Clubs or Organizations: Not on file  . Attends Banker Meetings: Not on file  . Marital Status: Not on file  Intimate Partner Violence:   . Fear of Current or Ex-Partner: Not on file  . Emotionally  Abused: Not on file  . Physically Abused: Not on file  . Sexually Abused: Not on file    Review of Systems  Constitutional: Negative.   HENT: Negative.   Eyes: Negative.   Respiratory: Negative.   Cardiovascular: Negative.   Gastrointestinal: Negative.   Genitourinary: Negative.   Musculoskeletal: Negative.   Skin: Negative.   Neurological: Negative.   Psychiatric/Behavioral: Negative.      Physical Exam BP 126/74   Ht 5\' 3"  (1.6 m)   Wt 204 lb (92.5 kg)   LMP 04/08/2020   BMI 36.14 kg/m   Physical Exam Constitutional:  General: She is not in acute distress.    Appearance: Normal appearance. She is well-developed.  Genitourinary:     Pelvic exam was performed with patient in the lithotomy position.     Vulva, urethra, bladder and uterus normal.     No inguinal adenopathy present in the right or left side.    No signs of injury in the vagina.     No vaginal discharge, erythema, tenderness or bleeding.     No cervical motion tenderness, discharge, lesion or polyp.     Uterus is mobile.     Uterus is not enlarged or tender.     No uterine mass detected.    Uterus is anteverted.     No right or left adnexal mass present.     Right adnexa not tender or full.     Left adnexa not tender or full.  HENT:     Head: Normocephalic and atraumatic.  Eyes:     General: No scleral icterus.    Conjunctiva/sclera: Conjunctivae normal.  Neck:     Thyroid: No thyromegaly.  Cardiovascular:     Rate and Rhythm: Normal rate and regular rhythm.     Heart sounds: No murmur heard.  No friction rub. No gallop.   Pulmonary:     Effort: Pulmonary effort is normal. No respiratory distress.     Breath sounds: Normal breath sounds. No wheezing or rales.  Chest:     Breasts:        Right: No inverted nipple, mass, nipple discharge, skin change or tenderness.        Left: No inverted nipple, mass, nipple discharge, skin change or tenderness.  Abdominal:     General: Bowel sounds are  normal. There is no distension.     Palpations: Abdomen is soft. There is no mass.     Tenderness: There is no abdominal tenderness. There is no guarding or rebound.  Musculoskeletal:        General: No swelling or tenderness. Normal range of motion.     Cervical back: Normal range of motion and neck supple.  Lymphadenopathy:     Cervical: No cervical adenopathy.     Lower Body: No right inguinal adenopathy. No left inguinal adenopathy.  Neurological:     General: No focal deficit present.     Mental Status: She is alert and oriented to person, place, and time.     Cranial Nerves: No cranial nerve deficit.  Skin:    General: Skin is warm and dry.     Findings: No erythema or rash.  Psychiatric:        Mood and Affect: Mood normal.        Behavior: Behavior normal.        Judgment: Judgment normal.     Female chaperone present for pelvic and breast  portions of the physical exam  Results: AUDIT Questionnaire (screen for alcoholism): 2 PHQ-9: 11  Assessment: 39 y.o. G30P0011 female here for routine gynecologic examination.  Plan: Problem List Items Addressed This Visit    None    Visit Diagnoses    Women's annual routine gynecological examination    -  Primary   Relevant Orders   Cytology - PAP   Screening for depression       Screening for alcoholism       ASCUS with positive high risk HPV cervical       Relevant Orders   Cytology - PAP      Screening: --  Blood pressure screen normal -- Colonoscopy - not due -- Mammogram - not due -- Weight screening: overweight: continue to monitor -- Depression screening negative (PHQ-9). She scored 11, but states this is being managed elsewhere and most of her elevated scoring is due to lack of good sleep, which is also being addressed elsewhere.  -- Nutrition: normal -- cholesterol screening: not due for screening -- osteoporosis screening: not due -- tobacco screening: not using -- alcohol screening: AUDIT questionnaire  indicates low-risk usage. -- family history of breast cancer screening: done. not at high risk. -- no evidence of domestic violence or intimate partner violence. -- STD screening: gonorrhea/chlamydia NAAT not collected per patient request. -- pap smear collected per ASCCP guidelines -- flu vaccine will receive today  Thomasene Mohair, MD 05/07/2020 4:17 PM

## 2020-05-14 LAB — CYTOLOGY - PAP
Comment: NEGATIVE
Diagnosis: NEGATIVE
High risk HPV: NEGATIVE

## 2020-05-27 ENCOUNTER — Other Ambulatory Visit: Payer: Self-pay

## 2020-05-27 DIAGNOSIS — F39 Unspecified mood [affective] disorder: Secondary | ICD-10-CM

## 2020-06-02 ENCOUNTER — Other Ambulatory Visit: Payer: Self-pay | Admitting: Family Medicine

## 2020-06-02 DIAGNOSIS — M797 Fibromyalgia: Secondary | ICD-10-CM

## 2020-06-02 NOTE — Telephone Encounter (Signed)
Requested Prescriptions  Pending Prescriptions Disp Refills  . meloxicam (MOBIC) 15 MG tablet [Pharmacy Med Name: MELOXICAM 15MG  TABLETS] 90 tablet 0    Sig: TAKE 1 TABLET(15 MG) BY MOUTH DAILY     Analgesics:  COX2 Inhibitors Passed - 06/02/2020 10:23 AM      Passed - HGB in normal range and within 360 days    Hemoglobin  Date Value Ref Range Status  08/15/2019 11.9 11.7 - 15.5 g/dL Final         Passed - Cr in normal range and within 360 days    Creat  Date Value Ref Range Status  08/15/2019 0.83 0.50 - 1.10 mg/dL Final         Passed - Patient is not pregnant      Passed - Valid encounter within last 12 months    Recent Outpatient Visits          2 months ago Neck strain, initial encounter   [facility], MD   5 months ago Mood disorder [facility])   [facility], MD   8 months ago Positive ANA (antinuclear antibody)   [facility], MD   8 months ago Frequency of urination   [facility], MD   11 months ago Mood disorder [facility])   [facility], MD      Future Appointments            In 3 weeks Alba Cory, Carlynn Purl, MD [facility]   In 5 months [facility], MD [facility]

## 2020-06-03 ENCOUNTER — Other Ambulatory Visit: Payer: Self-pay | Admitting: Family Medicine

## 2020-06-03 DIAGNOSIS — E559 Vitamin D deficiency, unspecified: Secondary | ICD-10-CM

## 2020-06-03 NOTE — Telephone Encounter (Signed)
Requested medication (s) are due for refill today: yes  Requested medication (s) are on the active medication list: yes  Last refill:  03/17/2020  Future visit scheduled: yes  Notes to clinic:  this refill cannot be delegated    Requested Prescriptions  Pending Prescriptions Disp Refills   Vitamin D, Ergocalciferol, (DRISDOL) 1.25 MG (50000 UNIT) CAPS capsule [Pharmacy Med Name: VITAMIN D2 50,000IU (ERGO) CAP RX] 12 capsule 1    Sig: TAKE 1 CAPSULE BY MOUTH EVERY 7 DAYS      Endocrinology:  Vitamins - Vitamin D Supplementation Failed - 06/03/2020  3:36 AM      Failed - 50,000 IU strengths are not delegated      Failed - Phosphate in normal range and within 360 days    No results found for: PHOS        Failed - Vitamin D in normal range and within 360 days    VITD  Date Value Ref Range Status  12/23/2017 16.99 (L) 30.00 - 100.00 ng/mL Final   Vit D, 25-Hydroxy  Date Value Ref Range Status  03/23/2019 20.80 (L) 30 - 100 ng/mL Final    Comment:    (NOTE) Vitamin D deficiency has been defined by the Institute of Medicine  and an Endocrine Society practice guideline as a level of serum 25-OH  vitamin D less than 20 ng/mL (1,2). The Endocrine Society went on to  further define vitamin D insufficiency as a level between 21 and 29  ng/mL (2). 1. IOM (Institute of Medicine). 2010. Dietary reference intakes for  calcium and D. [place]: [employer]. 2. Holick MF, Binkley , Bischoff-Ferrari HA, et al. Evaluation,  treatment, and prevention of vitamin D deficiency: an Endocrine  Society clinical practice guideline, JCEM. 2011 Jul; 96(7): 1911-30. Performed at [facility], [address], [place], [place] [address]           Passed - Ca in normal range and within 360 days    Calcium  Date Value Ref Range Status  08/15/2019 8.9 8.6 - 10.2 mg/dL Final          Passed - Valid encounter within last 12 months    Recent Outpatient Visits            2 months ago Neck strain, initial encounter   [facility] Jamelle Haring, MD   5 months ago Mood disorder [facility])   [facility] Alba Cory, MD   8 months ago Positive ANA (antinuclear antibody)   [facility] Alba Cory, MD   9 months ago Frequency of urination   [facility] Jamelle Haring, MD   11 months ago Mood disorder [facility])   [facility] Alba Cory, MD       Future Appointments             In 3 weeks Alba Cory, MD [facility], [facility]   In 5 months Pollyann Savoy, MD [facility] Rheumatology

## 2020-06-18 ENCOUNTER — Telehealth: Payer: BC Managed Care – PPO | Admitting: Internal Medicine

## 2020-06-20 ENCOUNTER — Other Ambulatory Visit: Payer: BC Managed Care – PPO

## 2020-06-20 DIAGNOSIS — Z20822 Contact with and (suspected) exposure to covid-19: Secondary | ICD-10-CM

## 2020-06-25 ENCOUNTER — Telehealth (INDEPENDENT_AMBULATORY_CARE_PROVIDER_SITE_OTHER): Payer: BC Managed Care – PPO | Admitting: Family Medicine

## 2020-06-25 ENCOUNTER — Encounter: Payer: Self-pay | Admitting: Family Medicine

## 2020-06-25 DIAGNOSIS — M797 Fibromyalgia: Secondary | ICD-10-CM | POA: Diagnosis not present

## 2020-06-25 DIAGNOSIS — F39 Unspecified mood [affective] disorder: Secondary | ICD-10-CM | POA: Diagnosis not present

## 2020-06-25 DIAGNOSIS — E559 Vitamin D deficiency, unspecified: Secondary | ICD-10-CM

## 2020-06-25 DIAGNOSIS — U071 COVID-19: Secondary | ICD-10-CM

## 2020-06-25 DIAGNOSIS — K582 Mixed irritable bowel syndrome: Secondary | ICD-10-CM

## 2020-06-25 DIAGNOSIS — R768 Other specified abnormal immunological findings in serum: Secondary | ICD-10-CM

## 2020-06-25 DIAGNOSIS — F5105 Insomnia due to other mental disorder: Secondary | ICD-10-CM

## 2020-06-25 DIAGNOSIS — F99 Mental disorder, not otherwise specified: Secondary | ICD-10-CM

## 2020-06-25 DIAGNOSIS — M792 Neuralgia and neuritis, unspecified: Secondary | ICD-10-CM | POA: Diagnosis not present

## 2020-06-25 DIAGNOSIS — E538 Deficiency of other specified B group vitamins: Secondary | ICD-10-CM

## 2020-06-25 LAB — NOVEL CORONAVIRUS, NAA: SARS-CoV-2, NAA: DETECTED — AB

## 2020-06-25 MED ORDER — BENZONATATE 100 MG PO CAPS: 100.0000 mg | ORAL_CAPSULE | Freq: Two times a day (BID) | ORAL | 0 refills | Status: DC | PRN

## 2020-06-25 MED ORDER — CARIPRAZINE HCL 1.5 MG PO CAPS: 1.5000 mg | ORAL_CAPSULE | Freq: Every day | ORAL | 1 refills | Status: DC

## 2020-06-25 MED ORDER — PREGABALIN 200 MG PO CAPS: 200.0000 mg | ORAL_CAPSULE | Freq: Three times a day (TID) | ORAL | 1 refills | Status: DC

## 2020-06-25 MED ORDER — MODAFINIL 100 MG PO TABS
100.0000 mg | ORAL_TABLET | Freq: Every day | ORAL | 1 refills | Status: DC
Start: 2020-06-25 — End: 2021-03-11

## 2020-06-25 NOTE — Progress Notes (Signed)
Name: Deborah Navarro   MRN: [id number]    DOB: 1980-12-23   Date:06/25/2020       Progress Note  Subjective  Chief Complaint  Follow Up/ COVID Positive  I connected with  Deborah Navarro  on 06/25/20 at  3:20 PM EST by a video enabled telemedicine application and verified that I am speaking with the correct person using two identifiers.  I discussed the limitations of evaluation and management by telemedicine and the availability of in person appointments. The patient expressed understanding and agreed to proceed with the virtual visit  Staff also discussed with the patient that there may be a patient responsible charge related to this service. Patient Location: at home  Provider Location: [facility] Additional Individuals present: by herself   HPI  COVID-19: she states her symptoms started on 06/12/2020, she was tested last week and it was positive . She did not get tested sooner because son was very sick and admitted to [facility] on January 3rd with complications of COVID-19.    Fatigue: B12 is back to normal but she continues to feel very tired. Explained likely multifactorial, but doing better on provigil   Feet Pain: she states pain has been going on forabout 9 months ,she states started with pain on forefoot, goes from toes to ball of her feet, currently worse on left foot. She states burning like, sometimes aching, sometimes it gets red at the end of the day, she also has intermittent swelling that is worse in the morning.She has been taking Meloxicam. Stable   FMS: she is on Lyrica, she states she still has pain, but mostly from neuropathy on her calves and worse during the day, she is taking200 mg TID. She states that meloxicamhas helped. She has history of elevated ANA , sed rate and CRPand sed rate. She was seen by Dr. Al Pimple and had more labs done, she has OA hands and positive ANA and at this time to try tumeric, ginger root daily. Discussed long term risk of nsaid's  use   Mood Disorder: . Insurance denied Vraylarinitially, she has tried Abilify, Elavil and Duloxetine but did not work well for her. We did a PA and she has been on Vraylar 1.5 mg since 2021. She states pandemic did not affect her stress level, but now is worried about her son that has COVID-19 and has been in the hospital for the past 8 days. She has been isolated by choice, she is afraid of virus but also likes to be home. She stated Provigil has really helped with energy level  IBS: long history of periods of constipation with intermittent diarrhea that is associated with bloating, and abdominal pain, no blood in stools or weight loss. Taking levsin prn and symptoms have been stable.   Patient Active Problem List   Diagnosis Date Noted  . Cervical disc disease 03/07/2020  . Irritable bowel syndrome (IBS) 09/26/2019  . Mood disorder (HCC) 08/05/2018  . Papanicolaou smear of cervix with positive high risk human papilloma virus (HPV) test 05/04/2018  . False positive ana 04/28/2018  . Pain in both feet 04/28/2018  . Facial rash 04/07/2018  . Chronic fatigue 04/07/2018  . Chronic neck pain 03/27/2016  . Myalgia 03/27/2016  . Left-sided headache 03/27/2016  . Family history of early CAD 03/15/2015  . History of colon polyps 06/30/2010  . History of anemia 06/30/2010  . Depression, major, in remission (HCC) 06/30/2010  . Endometriosis 06/30/2010  . CARDIAC MURMUR 06/30/2010  .  History of chicken pox 06/30/2010    Past Surgical History:  Procedure Laterality Date  . COLONOSCOPY WITH PROPOFOL N/A 05/16/2018   Procedure: COLONOSCOPY WITH PROPOFOL;  Surgeon: Midge Minium, MD;  Location[facility];  Service: Endoscopy;  Laterality: N/A;  . DILATION AND CURETTAGE OF UTERUS    . LAPAROSCOPIC ENDOMETRIOSIS FULGURATION  10-1999   endometriosis  . NECK SURGERY N/A 06/14/15   due to C5/6 C6-7 disc herniation   . open heart surgery  12-1994   asd repair    Family History   Problem Relation Age of Onset  . Cancer Mother 46       non hodgkins lymphoma(mets to brain)  . Coronary artery disease Father   . Hypertension Father   . Hyperlipidemia Father   . Diabetes Father   . Diabetes Maternal Grandmother   . Heart attack Maternal Grandfather   . Other Maternal Grandfather        Deterioration of Jaw  . Stroke Paternal Grandmother   . Stroke Paternal Grandfather   . Healthy Son     Social History   Socioeconomic History  . Marital status: Married    Spouse name: Asher Muir  . Number of children: 1  . Years of education: Not on file  . Highest education level: Not on file  Occupational History  . Occupation: Engineering geologist: [employer]  Tobacco Use  . Smoking status: Never Smoker  . Smokeless tobacco: Never Used  Vaping Use  . Vaping Use: Never used  Substance and Sexual Activity  . Alcohol use: Not Currently    Alcohol/week: 1.0 standard drink    Types: 1 Shots of liquor per week    Comment: very occasionally (couple x/yr)  . Drug use: No  . Sexual activity: Yes    Partners: Male    Birth control/protection: None  Other Topics Concern  . 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 Worried About Programme researcher, broadcasting/film/video in the Last Year: Never true  . Ran Out of Food in the Last Year: Never true  Transportation Needs: No Transportation Needs  . Lack of Transportation (Medical): No  . Lack of Transportation (Non-Medical): No  Physical Activity: Inactive  . Days of Exercise per Week: 0 days  . Minutes of Exercise per Session: 0 min  Stress: Not on file  Social Connections: Socially Isolated  . Frequency of Communication with Friends and Family: Once a week  . Frequency of Social Gatherings with Friends and Family: Once a week  . Attends Religious Services: Never  . Active Member of  Clubs or Organizations: No  . Attends Banker Meetings: Never  . Marital Status: Married  Catering manager Violence: Not At Risk  . Fear of Current or Ex-Partner: No  . Emotionally Abused: No  . Physically Abused: No  . Sexually Abused: No     Current Outpatient Medications:  .  Cyanocobalamin (B-12) 1000 MCG SUBL, Place 1 tablet under the tongue daily., Disp: 30 tablet, Rfl: 0 .  Ginger, Zingiber officinalis, (GINGER ROOT PO), Take by mouth., Disp: , Rfl:  .  hydrOXYzine (ATARAX/VISTARIL) 10 MG tablet, Take 1 tablet (10 mg total) by mouth 3 (three) times daily as needed., Disp: 30 tablet, Rfl: 0 .  hyoscyamine (LEVSIN) 0.125 MG tablet, Take  1 tablet (0.125 mg total) by mouth every 4 (four) hours as needed., Disp: 60 tablet, Rfl: 0 .  meloxicam (MOBIC) 15 MG tablet, TAKE 1 TABLET(15 MG) BY MOUTH DAILY, Disp: 90 tablet, Rfl: 0 .  TURMERIC PO, Take by mouth daily., Disp: , Rfl:  .  Vitamin D, Ergocalciferol, (DRISDOL) 1.25 MG (50000 UNIT) CAPS capsule, TAKE 1 CAPSULE BY MOUTH EVERY 7 DAYS, Disp: 12 capsule, Rfl: 1 .  cariprazine (VRAYLAR) capsule, Take 1 capsule (1.5 mg total) by mouth daily., Disp: 90 capsule, Rfl: 1 .  modafinil (PROVIGIL) 100 MG tablet, Take 1 tablet (100 mg total) by mouth daily., Disp: 90 tablet, Rfl: 1 .  pregabalin (LYRICA) 200 MG capsule, Take 1 capsule (200 mg total) by mouth 3 (three) times daily., Disp: 270 capsule, Rfl: 1  No Known Allergies  I personally reviewed active problem list, medication list, allergies, family history, social history, health maintenance with the patient/caregiver today.   ROS  Ten systems reviewed and is negative except as mentioned in HPI   Objective  Virtual encounter, vitals not obtained.  There is no height or weight on file to calculate BMI.  Physical Exam  Awake, alert and oriented  PHQ2/9: Depression screen [facility] 2/9 03/07/2020 12/20/2019 09/26/2019 09/07/2019 06/27/2019  Decreased Interest 1 0 1 0 1  Down,  Depressed, Hopeless 0 0 0 0 1  PHQ - 2 Score 1 0 1 0 2  Altered sleeping - 0 1 2 2   Tired, decreased energy - 3 3 3 1   Change in appetite - 0 1 0 2  Feeling bad or failure about yourself  - 0 0 0 0  Trouble concentrating - 0 1 0 1  Moving slowly or fidgety/restless - 0 0 0 0  Suicidal thoughts - 0 0 0 0  PHQ-9 Score - 3 7 5 8   Difficult doing work/chores - Not difficult at all Somewhat difficult Not difficult at all Somewhat difficult  Some recent data might be hidden   PHQ-2/9 Result is positive.    Fall Risk: Fall Risk  06/25/2020 03/07/2020 12/20/2019 09/26/2019 09/07/2019  Falls in the past year? 0 0 0 0 0  Number falls in past yr: 0 0 0 0 0  Injury with Fall? 0 0 0 0 0  Follow up - Falls evaluation completed - - -    Assessment & Plan   I discussed the assessment and treatment plan with the patient. The patient was provided an opportunity to ask questions and all were answered. The patient agreed with the plan and demonstrated an understanding of the instructions.  The patient was advised to call back or seek an in-person evaluation if the symptoms worsen or if the condition fails to improve as anticipated.  I provided 25 minutes of non-face-to-face time during this encounter.

## 2020-07-24 ENCOUNTER — Other Ambulatory Visit: Payer: Self-pay | Admitting: Family Medicine

## 2020-07-24 DIAGNOSIS — F39 Unspecified mood [affective] disorder: Secondary | ICD-10-CM

## 2020-07-24 NOTE — Telephone Encounter (Signed)
Requested medication (s) are due for refill today: yes  Requested medication (s) are on the active medication list: yes  Last refill:  06/26/2020  Future visit scheduled: yes  Notes to clinic: this refill cannot be delegated    Requested Prescriptions  Pending Prescriptions Disp Refills   VRAYLAR capsule [Pharmacy Med Name: VRAYLAR 1.5MG  CAPSULES] 30 capsule     Sig: TAKE 1 CAPSULE(1.5 MG) BY MOUTH DAILY      Off-Protocol Failed - 07/24/2020  3:35 AM      Failed - Medication not assigned to a protocol, review manually.      Passed - Valid encounter within last 12 months    Recent Outpatient Visits           4 weeks ago Mood disorder [facility])   [facility] Alba Cory, MD   4 months ago Neck strain, initial encounter   [facility] Jamelle Haring, MD   7 months ago Mood disorder [facility])   [facility] Alba Cory, MD   10 months ago Positive ANA (antinuclear antibody)   [facility] Alba Cory, MD   10 months ago Frequency of urination   [facility] Jamelle Haring, MD       Future Appointments             In 2 months Carlynn Purl, Danna Hefty, MD [facility], [facility]   In 4 months Pollyann Savoy, MD [facility]

## 2020-09-24 ENCOUNTER — Ambulatory Visit: Payer: BC Managed Care – PPO | Admitting: Family Medicine

## 2020-09-24 NOTE — Progress Notes (Signed)
Name: Deborah Navarro   MRN: [id number]    DOB: Feb 03, 1981   Date:09/25/2020       Progress Note  Subjective  Chief Complaint  Follow Up  HPI  Fatigue: B12 is back to normal but she continues to feel very tired. Explained likely multifactorial. She has also added biotin due to hair loss since COVID-19 ( symptoms started 05/2020)  Feet Pain: she states pain has been going since 2021 she states started with pain on forefoot, goes from toes to ball of her feet, currently worse on left foot. She states burning like, sometimes aching, sometimes it gets red at the end of the day, she also has intermittent swelling that is worse in the morning.She has been taking Meloxicam. Advised to discuss it with Rheumatologist when she goes back in July . Seen by Dr. Ether Griffins in 2020, advised orthotics but not covered by insurance   FMS: she has been taking Lyrica 200 mg TID, she has noticed weight gain. She also states having more pain lately, aching all over, gets worse with activity. She states that meloxicamalso  helped. She has history of elevated ANA , sed rate and CRPand sed rate. She was seen by Dr. Al Pimple and had more labs done, she has OA hands and positive ANA and at this time to try tumeric, ginger root daily. She tried stopped Meloxicam but the pain increased significantly and she had to resume medication.   Mood Disorder: . Insurance denied Vraylarinitially, she has tried Abilify, Elavil and Duloxetine but did not work well for her. We did a PA and she has been on Vraylar 1.5 mg since 2021. She is back in school and works full time. She states medication works well for him   IBS: long history of periods of constipation with intermittent diarrhea that is associated with bloating, and abdominal pain, no blood in stools or weight loss. She states stopped taking Levsin months ago .   Urge incontinence: going on for over one year, but getting much worse, states rarely can make it to the  bathroom, had one episode of nocturnal enuresis. Denies urine odor, discussed medication but she states she prefers not trying something new and going to see Urologist     Patient Active Problem List   Diagnosis Date Noted  . Cervical disc disease 03/07/2020  . Irritable bowel syndrome (IBS) 09/26/2019  . Mood disorder (HCC) 08/05/2018  . Papanicolaou smear of cervix with positive high risk human papilloma virus (HPV) test 05/04/2018  . False positive ana 04/28/2018  . Pain in both feet 04/28/2018  . Facial rash 04/07/2018  . Chronic fatigue 04/07/2018  . Chronic neck pain 03/27/2016  . Myalgia 03/27/2016  . Left-sided headache 03/27/2016  . Family history of early CAD 03/15/2015  . History of colon polyps 06/30/2010  . History of anemia 06/30/2010  . Depression, major, in remission (HCC) 06/30/2010  . Endometriosis 06/30/2010  . CARDIAC MURMUR 06/30/2010  . History of chicken pox 06/30/2010    Past Surgical History:  Procedure Laterality Date  . COLONOSCOPY WITH PROPOFOL N/A 05/16/2018   Procedure: COLONOSCOPY WITH PROPOFOL;  Surgeon: Midge Minium, MD;  Location[facility];  Service: Endoscopy;  Laterality: N/A;  . DILATION AND CURETTAGE OF UTERUS    . LAPAROSCOPIC ENDOMETRIOSIS FULGURATION  10-1999   endometriosis  . NECK SURGERY N/A 06/14/15   due to C5/6 C6-7 disc herniation   . open heart surgery  12-1994   asd repair    Family  History  Problem Relation Age of Onset  . Cancer Mother 9       non hodgkins lymphoma(mets to brain)  . Coronary artery disease Father   . Hypertension Father   . Hyperlipidemia Father   . Diabetes Father   . Diabetes Maternal Grandmother   . Heart attack Maternal Grandfather   . Other Maternal Grandfather        Deterioration of Jaw  . Stroke Paternal Grandmother   . Stroke Paternal Grandfather   . Healthy Son     Social History   Tobacco Use  . Smoking status: Never Smoker  . Smokeless tobacco: Never Used  Substance  Use Topics  . Alcohol use: Not Currently    Alcohol/week: 1.0 standard drink    Types: 1 Shots of liquor per week    Comment: very occasionally (couple x/yr)     Current Outpatient Medications:  .  Cyanocobalamin (B-12) 1000 MCG SUBL, Place 1 tablet under the tongue daily., Disp: 30 tablet, Rfl: 0 .  Ginger, Zingiber officinalis, (GINGER ROOT PO), Take by mouth., Disp: , Rfl:  .  meloxicam (MOBIC) 15 MG tablet, TAKE 1 TABLET(15 MG) BY MOUTH DAILY, Disp: 90 tablet, Rfl: 0 .  modafinil (PROVIGIL) 100 MG tablet, Take 1 tablet (100 mg total) by mouth daily., Disp: 90 tablet, Rfl: 1 .  pregabalin (LYRICA) 200 MG capsule, Take 1 capsule (200 mg total) by mouth 3 (three) times daily., Disp: 270 capsule, Rfl: 1 .  TURMERIC PO, Take by mouth daily., Disp: , Rfl:  .  Vitamin D, Ergocalciferol, (DRISDOL) 1.25 MG (50000 UNIT) CAPS capsule, TAKE 1 CAPSULE BY MOUTH EVERY 7 DAYS, Disp: 12 capsule, Rfl: 1 .  VRAYLAR capsule, TAKE 1 CAPSULE(1.5 MG) BY MOUTH DAILY, Disp: 30 capsule, Rfl: 2 .  hydrOXYzine (ATARAX/VISTARIL) 10 MG tablet, Take 1 tablet (10 mg total) by mouth 3 (three) times daily as needed. (Patient not taking: Reported on 09/25/2020), Disp: 30 tablet, Rfl: 0 .  hyoscyamine (LEVSIN) 0.125 MG tablet, Take 1 tablet (0.125 mg total) by mouth every 4 (four) hours as needed. (Patient not taking: Reported on 09/25/2020), Disp: 60 tablet, Rfl: 0  No Known Allergies  I personally reviewed active problem list, medication list, allergies, family history, social history, health maintenance with the patient/caregiver today.   ROS  Constitutional: Negative for fever, positive for weight change.  Respiratory: Negative for cough and shortness of breath.   Cardiovascular: Negative for chest pain or palpitations.  Gastrointestinal: Negative for abdominal pain, no bowel changes.  Musculoskeletal: positive  for gait problem and intermittent joint swelling.  Skin: Negative for rash.  Neurological: Negative  for dizziness or headache.  No other specific complaints in a complete review of systems (except as listed in HPI above).   Objective  Vitals:   09/25/20 1518  BP: 126/78  Pulse: 95  Resp: 16  Temp: 98.5 F (36.9 C)  TempSrc: Oral  SpO2: 99%  Weight: 211 lb (95.7 kg)  Height: 5\' 3"  (1.6 m)    Body mass index is 37.38 kg/m.  Physical Exam  Constitutional: Patient appears well-developed and well-nourished. Obese No distress.  HEENT: head atraumatic, normocephalic, pupils equal and reactive to light, , neck supple Cardiovascular: Normal rate, regular rhythm and normal heart sounds.  No murmur heard. No BLE edema. Pulmonary/Chest: Effort normal and breath sounds normal. No respiratory distress. Abdominal: Soft.  There is no tenderness. Muscular Skeletal: trigger point positive  Psychiatric: Patient has a normal mood and affect. behavior  is normal. Judgment and thought content normal.  PHQ2/9: Depression screen [facility] 2/9 09/25/2020 03/07/2020 12/20/2019 09/26/2019 09/07/2019  Decreased Interest 1 1 0 1 0  Down, Depressed, Hopeless 0 0 0 0 0  PHQ - 2 Score 1 1 0 1 0  Altered sleeping 3 - 0 1 2  Tired, decreased energy 3 - 3 3 3   Change in appetite 3 - 0 1 0  Feeling bad or failure about yourself  0 - 0 0 0  Trouble concentrating 0 - 0 1 0  Moving slowly or fidgety/restless 0 - 0 0 0  Suicidal thoughts 0 - 0 0 0  PHQ-9 Score 10 - 3 7 5   Difficult doing work/chores - - Not difficult at all Somewhat difficult Not difficult at all  Some recent data might be hidden    phq 9 is positive   Fall Risk: Fall Risk  09/25/2020 06/25/2020 03/07/2020 12/20/2019 09/26/2019  Falls in the past year? 0 0 0 0 0  Number falls in past yr: 0 0 0 0 0  Injury with Fall? 0 0 0 0 0  Follow up - - Falls evaluation completed - -     Functional Status Survey: Is the patient deaf or have difficulty hearing?: No Does the patient have difficulty seeing, even when wearing glasses/contacts?: No Does the  patient have difficulty concentrating, remembering, or making decisions?: No Does the patient have difficulty walking or climbing stairs?: No Does the patient have difficulty dressing or bathing?: No Does the patient have difficulty doing errands alone such as visiting a doctor's office or shopping?: No    Assessment & Plan  1. Fibromyalgia syndrome  - meloxicam (MOBIC) 15 MG tablet; Take 1 tablet (15 mg total) by mouth daily.  Dispense: 90 tablet; Refill: 1  2. Mood disorder (HCC)  - cariprazine (VRAYLAR) capsule; Take 1 capsule (1.5 mg total) by mouth daily.  Dispense: 90 capsule; Refill: 1  3. Vitamin D deficiency  Continue supplementation   4. B12 deficiency   5. Deficiency of vitamin B12   6. Irritable bowel syndrome with both constipation and diarrhea   7. Neuropathic pain   8. Nocturnal enuresis  - Ambulatory referral to Urology - Urine Culture  9. Urge incontinence  - Ambulatory referral to Urology - Urine Culture

## 2020-09-25 ENCOUNTER — Other Ambulatory Visit: Payer: Self-pay

## 2020-09-25 ENCOUNTER — Encounter: Payer: Self-pay | Admitting: Family Medicine

## 2020-09-25 ENCOUNTER — Ambulatory Visit (INDEPENDENT_AMBULATORY_CARE_PROVIDER_SITE_OTHER): Payer: BC Managed Care – PPO | Admitting: Family Medicine

## 2020-09-25 VITALS — BP 126/78 | HR 95 | Temp 98.5°F | Resp 16 | Ht 63.0 in | Wt 211.0 lb

## 2020-09-25 DIAGNOSIS — M797 Fibromyalgia: Secondary | ICD-10-CM | POA: Diagnosis not present

## 2020-09-25 DIAGNOSIS — E538 Deficiency of other specified B group vitamins: Secondary | ICD-10-CM | POA: Diagnosis not present

## 2020-09-25 DIAGNOSIS — E559 Vitamin D deficiency, unspecified: Secondary | ICD-10-CM | POA: Diagnosis not present

## 2020-09-25 DIAGNOSIS — F39 Unspecified mood [affective] disorder: Secondary | ICD-10-CM | POA: Diagnosis not present

## 2020-09-25 DIAGNOSIS — N3941 Urge incontinence: Secondary | ICD-10-CM

## 2020-09-25 DIAGNOSIS — M792 Neuralgia and neuritis, unspecified: Secondary | ICD-10-CM

## 2020-09-25 DIAGNOSIS — N3944 Nocturnal enuresis: Secondary | ICD-10-CM

## 2020-09-25 DIAGNOSIS — E785 Hyperlipidemia, unspecified: Secondary | ICD-10-CM

## 2020-09-25 DIAGNOSIS — K582 Mixed irritable bowel syndrome: Secondary | ICD-10-CM

## 2020-09-25 MED ORDER — CARIPRAZINE HCL 1.5 MG PO CAPS: 1.5000 mg | ORAL_CAPSULE | Freq: Every day | ORAL | 1 refills | Status: DC

## 2020-09-25 MED ORDER — MELOXICAM 15 MG PO TABS: 15.0000 mg | ORAL_TABLET | Freq: Every day | ORAL | 1 refills | Status: DC

## 2020-09-26 LAB — CBC WITH DIFFERENTIAL/PLATELET
Absolute Monocytes: 557 cells/uL (ref 200–950)
Basophils Absolute: 38 cells/uL (ref 0–200)
Basophils Relative: 0.6 %
Eosinophils Absolute: 58 cells/uL (ref 15–500)
Eosinophils Relative: 0.9 %
HCT: 37.7 % (ref 35.0–45.0)
Hemoglobin: 12.1 g/dL (ref 11.7–15.5)
Lymphs Abs: 2592 cells/uL (ref 850–3900)
MCH: 29 pg (ref 27.0–33.0)
MCHC: 32.1 g/dL (ref 32.0–36.0)
MCV: 90.4 fL (ref 80.0–100.0)
MPV: 11 fL (ref 7.5–12.5)
Monocytes Relative: 8.7 %
Neutro Abs: 3155 cells/uL (ref 1500–7800)
Neutrophils Relative %: 49.3 %
Platelets: 286 10*3/uL (ref 140–400)
RBC: 4.17 10*6/uL (ref 3.80–5.10)
RDW: 12.6 % (ref 11.0–15.0)
Total Lymphocyte: 40.5 %
WBC: 6.4 10*3/uL (ref 3.8–10.8)

## 2020-09-26 LAB — COMPLETE METABOLIC PANEL WITH GFR
AG Ratio: 1.5 (calc) (ref 1.0–2.5)
ALT: 22 U/L (ref 6–29)
AST: 18 U/L (ref 10–30)
Albumin: 4.3 g/dL (ref 3.6–5.1)
Alkaline phosphatase (APISO): 69 U/L (ref 31–125)
BUN: 18 mg/dL (ref 7–25)
CO2: 29 mmol/L (ref 20–32)
Calcium: 9.4 mg/dL (ref 8.6–10.2)
Chloride: 103 mmol/L (ref 98–110)
Creat: 0.94 mg/dL (ref 0.50–1.10)
GFR, Est African American: 87 mL/min/{1.73_m2} (ref >=60)
GFR, Est Non African American: 75 mL/min/{1.73_m2} (ref >=60)
Globulin: 2.9 g/dL (calc) (ref 1.9–3.7)
Glucose, Bld: 87 mg/dL (ref 65–99)
Potassium: 4 mmol/L (ref 3.5–5.3)
Sodium: 139 mmol/L (ref 135–146)
Total Bilirubin: 0.3 mg/dL (ref 0.2–1.2)
Total Protein: 7.2 g/dL (ref 6.1–8.1)

## 2020-09-26 LAB — URINE CULTURE
MICRO NUMBER:: 11766417
Result:: NO GROWTH
SPECIMEN QUALITY:: ADEQUATE

## 2020-09-26 LAB — VITAMIN D 25 HYDROXY (VIT D DEFICIENCY, FRACTURES): Vit D, 25-Hydroxy: 54 ng/mL (ref 30–100)

## 2020-09-26 LAB — VITAMIN B12: Vitamin B-12: >2000 pg/mL — ABNORMAL HIGH (ref 200–1100)

## 2020-10-28 ENCOUNTER — Ambulatory Visit (INDEPENDENT_AMBULATORY_CARE_PROVIDER_SITE_OTHER): Payer: BC Managed Care – PPO | Admitting: Urology

## 2020-10-28 ENCOUNTER — Other Ambulatory Visit: Payer: Self-pay

## 2020-10-28 VITALS — BP 136/85 | HR 80 | Wt 200.0 lb

## 2020-10-28 DIAGNOSIS — N3941 Urge incontinence: Secondary | ICD-10-CM | POA: Diagnosis not present

## 2020-10-28 DIAGNOSIS — N3946 Mixed incontinence: Secondary | ICD-10-CM

## 2020-10-28 MED ORDER — MIRABEGRON ER 50 MG PO TB24: 50.0000 mg | ORAL_TABLET | Freq: Every day | ORAL | 11 refills | Status: DC

## 2020-10-28 NOTE — Progress Notes (Signed)
10/28/2020 1:14 PM   Deborah Navarro 1980/10/15 [id number]  Referring provider: Alba Cory, MD [address] [place],  [place] [address]  Chief Complaint  Patient presents with  . Urinary Incontinence    HPI: I was consulted to assess the patient's frequency and mild urinary incontinence.  She voids every 1-2 hours.  She can have urge incontinence and stress incontinence.  Does not wear a pad.  Has had rare bedwetting.  Flow was good.  Denies history kidney stones bladder surgery bladder infections and has no neurologic issue and has not had a hysterectomy   PMH: Past Medical History:  Diagnosis Date  . Anemia, unspecified   . ASD (atrial septal defect)    repaired 1996  . Blood transfusion, without reported diagnosis   . Depressive disorder, not elsewhere classified   . Elevated blood pressure reading without diagnosis of hypertension   . Endometriosis of fallopian tube   . Family history of adverse reaction to anesthesia    Father - PONV  . Irritable bowel syndrome   . PONV (postoperative nausea and vomiting)   . Streptococcal meningitis   . Undiagnosed cardiac murmurs   . Unspecified personal history presenting hazards to health   . Wears contact lenses     Surgical History: Past Surgical History:  Procedure Laterality Date  . COLONOSCOPY WITH PROPOFOL N/A 05/16/2018   Procedure: COLONOSCOPY WITH PROPOFOL;  Surgeon: Midge Minium, MD;  Location: [facility];  Service: Endoscopy;  Laterality: N/A;  . DILATION AND CURETTAGE OF UTERUS    . LAPAROSCOPIC ENDOMETRIOSIS FULGURATION  10-1999   endometriosis  . NECK SURGERY N/A 06/14/15   due to C5/6 C6-7 disc herniation   . open heart surgery  12-1994   asd repair    Home Medications:  Allergies as of 10/28/2020   No Known Allergies     Medication List       Accurate as of Oct 28, 2020  1:14 PM. If you have any questions, ask your nurse or doctor.        B-12 1000 MCG [address]  1 tablet under the tongue daily.   cariprazine 1.5 MG capsule Commonly known as: Vraylar Take 1 capsule (1.5 mg total) by mouth daily.   GINGER ROOT PO Take by mouth.   meloxicam 15 MG tablet Commonly known as: MOBIC Take 1 tablet (15 mg total) by mouth daily.   modafinil 100 MG tablet Commonly known as: PROVIGIL Take 1 tablet (100 mg total) by mouth daily.   pregabalin 200 MG capsule Commonly known as: LYRICA Take 1 capsule (200 mg total) by mouth 3 (three) times daily.   TURMERIC PO Take by mouth daily.   Vitamin D (Ergocalciferol) 1.25 MG (50000 UNIT) Caps capsule Commonly known as: DRISDOL TAKE 1 CAPSULE BY MOUTH EVERY 7 DAYS       Allergies: No Known Allergies  Family History: Family History  Problem Relation Age of Onset  . Cancer Mother 64       non hodgkins lymphoma(mets to brain)  . Coronary artery disease Father   . Hypertension Father   . Hyperlipidemia Father   . Diabetes Father   . Diabetes Maternal Grandmother   . Heart attack Maternal Grandfather   . Other Maternal Grandfather        Deterioration of Jaw  . Stroke Paternal Grandmother   . Stroke Paternal Grandfather   . Healthy Son     Social History:  reports that she  has never smoked. She has never used smokeless tobacco. She reports previous alcohol use of about 1.0 standard drink of alcohol per week. She reports that she does not use drugs.  ROS:                                        Physical Exam: BP 136/85   Pulse 80   Wt 90.7 kg   BMI 35.43 kg/m   Constitutional:  Alert and oriented, No acute distress. HEENT: [place] AT, moist mucus membranes.  Trachea midline, no masses. Cardiovascular: No clubbing, cyanosis, or edema. Respiratory: Normal respiratory effort, no increased work of breathing. GI: Abdomen is soft, nontender, nondistended, no abdominal masses GU: Grade 2 hypermobility the bladder neck and negative cough test.  Good spring back affect.  No  prolapse Skin: No rashes, bruises or suspicious lesions. Lymph: No cervical or inguinal adenopathy. Neurologic: Grossly intact, no focal deficits, moving all 4 extremities. Psychiatric: Normal mood and affect.  Laboratory Data: Lab Results  Component Value Date   WBC 6.4 09/25/2020   HGB 12.1 09/25/2020   HCT 37.7 09/25/2020   MCV 90.4 09/25/2020   PLT 286 09/25/2020    Lab Results  Component Value Date   CREATININE 0.94 09/25/2020    No results found for: PSA  No results found for: TESTOSTERONE  Lab Results  Component Value Date   HGBA1C 5.4 03/23/2019    Urinalysis    Component Value Date/Time   COLORURINE YELLOW 09/07/2019 1055   APPEARANCEUR CLEAR 09/07/2019 1055   LABSPEC 1.009 09/07/2019 1055   PHURINE 8.0 09/07/2019 1055   GLUCOSEU NEGATIVE 09/07/2019 1055   HGBUR NEGATIVE 09/07/2019 1055   BILIRUBINUR Negative 09/07/2019 1003   KETONESUR NEGATIVE 09/07/2019 1055   PROTEINUR NEGATIVE 09/07/2019 1055   PROTEINUR Negative 09/07/2019 1003   UROBILINOGEN 0.2 09/07/2019 1003   UROBILINOGEN 0.2 02/28/2014 2052   NITRITE NEGATIVE 09/07/2019 1055   NITRITE Negative 09/07/2019 1003   LEUKOCYTESUR NEGATIVE 09/07/2019 1055   LEUKOCYTESUR Trace (A) 09/07/2019 1003    Pertinent Imaging: Urine reviewed.  Urine sent for culture.  Chart reviewed  Assessment & Plan: Patient has mild mixed incontinence.  Instead stress incontinence worse but both are mild I will hold off for urodynamics at this stage.  Role of medical and behavioral therapy discussed.  Call if urine culture positive  Reassess in 6 weeks on Myrbetriq 50 mg samples and prescription.  She did not want physical therapy.  She understands that she may need urodynamics in the future although details of test was not discussed  1. Urge incontinence  - Urinalysis, Complete   No follow-ups on file.  Martina Sinner, MD  [facility] [address], [address] [place], [place]  [address] [phone]

## 2020-10-31 LAB — URINALYSIS, COMPLETE
Bilirubin, UA: NEGATIVE
Glucose, UA: NEGATIVE
Ketones, UA: NEGATIVE
Leukocytes,UA: NEGATIVE
Nitrite, UA: NEGATIVE
Protein,UA: NEGATIVE
Specific Gravity, UA: 1.02 (ref 1.005–1.030)
Urobilinogen, Ur: 0.2 mg/dL (ref 0.2–1.0)
pH, UA: 6.5 (ref 5.0–7.5)

## 2020-10-31 LAB — MICROSCOPIC EXAMINATION: Bacteria, UA: NONE SEEN

## 2020-11-01 LAB — CULTURE, URINE COMPREHENSIVE

## 2020-11-07 NOTE — Progress Notes (Deleted)
Office Visit Note  Patient: Deborah Navarro             Date of Birth: 03/24/1981           MRN: [id number]             PCP: Alba Cory, MD Referring: Alba Cory, MD Visit Date: 11/21/2020 Occupation: @GUAROCC @  Subjective:  No chief complaint on file.   History of Present Illness: Deborah Navarro is a 40 y.o. female ***   Activities of Daily Living:  Patient reports morning stiffness for *** {minute/hour:19697}.   Patient {ACTIONS;DENIES/REPORTS:21021675::"Denies"} nocturnal pain.  Difficulty dressing/grooming: {ACTIONS;DENIES/REPORTS:21021675::"Denies"} Difficulty climbing stairs: {ACTIONS;DENIES/REPORTS:21021675::"Denies"} Difficulty getting out of chair: {ACTIONS;DENIES/REPORTS:21021675::"Denies"} Difficulty using hands for taps, buttons, cutlery, and/or writing: {ACTIONS;DENIES/REPORTS:21021675::"Denies"}  No Rheumatology ROS completed.   PMFS History:  Patient Active Problem List   Diagnosis Date Noted  . Cervical disc disease 03/07/2020  . Irritable bowel syndrome (IBS) 09/26/2019  . Mood disorder (HCC) 08/05/2018  . Papanicolaou smear of cervix with positive high risk human papilloma virus (HPV) test 05/04/2018  . False positive ana 04/28/2018  . Pain in both feet 04/28/2018  . Facial rash 04/07/2018  . Chronic fatigue 04/07/2018  . Chronic neck pain 03/27/2016  . Myalgia 03/27/2016  . Left-sided headache 03/27/2016  . Family history of early CAD 03/15/2015  . History of colon polyps 06/30/2010  . History of anemia 06/30/2010  . Depression, major, in remission (HCC) 06/30/2010  . Endometriosis 06/30/2010  . CARDIAC MURMUR 06/30/2010  . History of chicken pox 06/30/2010    Past Medical History:  Diagnosis Date  . Anemia, unspecified   . ASD (atrial septal defect)    repaired 1996  . Blood transfusion, without reported diagnosis   . Depressive disorder, not elsewhere classified   . Elevated blood pressure reading without diagnosis of  hypertension   . Endometriosis of fallopian tube   . Family history of adverse reaction to anesthesia    Father - PONV  . Irritable bowel syndrome   . PONV (postoperative nausea and vomiting)   . Streptococcal meningitis   . Undiagnosed cardiac murmurs   . Unspecified personal history presenting hazards to health   . Wears contact lenses     Family History  Problem Relation Age of Onset  . Cancer Mother 69       non hodgkins lymphoma(mets to brain)  . Coronary artery disease Father   . Hypertension Father   . Hyperlipidemia Father   . Diabetes Father   . Diabetes Maternal Grandmother   . Heart attack Maternal Grandfather   . Other Maternal Grandfather        Deterioration of Jaw  . Stroke Paternal Grandmother   . Stroke Paternal Grandfather   . Healthy Son    Past Surgical History:  Procedure Laterality Date  . COLONOSCOPY WITH PROPOFOL N/A 05/16/2018   Procedure: COLONOSCOPY WITH PROPOFOL;  Surgeon: 14/07/2017, MD;  Location: [facility];  Service: Endoscopy;  Laterality: N/A;  . DILATION AND CURETTAGE OF UTERUS    . LAPAROSCOPIC ENDOMETRIOSIS FULGURATION  10-1999   endometriosis  . NECK SURGERY N/A 06/14/15   due to C5/6 C6-7 disc herniation   . open heart surgery  12-1994   asd repair   Social History   Social History Narrative   Working 3 jobs currently   Immunization History  Administered Date(s) Administered  . Influenza Split 02/26/2011  . Influenza,inj,Quad PF,6+ Mos 03/15/2015, 03/23/2018  . Influenza-Unspecified 03/23/2019, 05/07/2020  .  Tdap 03/15/2015     Objective: Vital Signs: There were no vitals taken for this visit.   Physical Exam   Musculoskeletal Exam: ***  CDAI Exam: CDAI Score: -- Patient Global: --; Provider Global: -- Swollen: --; Tender: -- Joint Exam 11/21/2020   No joint exam has been documented for this visit   There is currently no information documented on the homunculus. Go to the Rheumatology activity and  complete the homunculus joint exam.  Investigation: No additional findings.  Imaging: No results found.  Recent Labs: Lab Results  Component Value Date   WBC 6.4 09/25/2020   HGB 12.1 09/25/2020   PLT 286 09/25/2020   NA 139 09/25/2020   K 4.0 09/25/2020   CL 103 09/25/2020   CO2 29 09/25/2020   GLUCOSE 87 09/25/2020   BUN 18 09/25/2020   CREATININE 0.94 09/25/2020   BILITOT 0.3 09/25/2020   ALKPHOS 59 03/23/2019   AST 18 09/25/2020   ALT 22 09/25/2020   PROT 7.2 09/25/2020   ALBUMIN 4.1 03/23/2019   CALCIUM 9.4 09/25/2020   GFRAA 87 09/25/2020    Speciality Comments: No specialty comments available.  Procedures:  No procedures performed Allergies: Patient has no known allergies.   Assessment / Plan:     Visit Diagnoses: Positive ANA (antinuclear antibody)  Primary osteoarthritis of both hands  Primary osteoarthritis of both feet  Fibromyalgia  Chronic fatigue  Irritable bowel syndrome with constipation  Depression, major, in remission (HCC)  Papanicolaou smear of cervix with positive high risk human papilloma virus (HPV) test  Orders: No orders of the defined types were placed in this encounter.  No orders of the defined types were placed in this encounter.   Face-to-face time spent with patient was *** minutes. Greater than 50% of time was spent in counseling and coordination of care.  Follow-Up Instructions: No follow-ups on file.   Gearldine Bienenstock, PA-C  Note - This record has been created using Dragon software.  Chart creation errors have been sought, but may not always  have been located. Such creation errors do not reflect on  the standard of medical care.

## 2020-11-12 ENCOUNTER — Other Ambulatory Visit: Payer: Self-pay | Admitting: Family Medicine

## 2020-11-12 DIAGNOSIS — E559 Vitamin D deficiency, unspecified: Secondary | ICD-10-CM

## 2020-11-21 ENCOUNTER — Ambulatory Visit: Payer: BC Managed Care – PPO | Admitting: Rheumatology

## 2020-11-21 DIAGNOSIS — R5382 Chronic fatigue, unspecified: Secondary | ICD-10-CM

## 2020-11-21 DIAGNOSIS — M19071 Primary osteoarthritis, right ankle and foot: Secondary | ICD-10-CM

## 2020-11-21 DIAGNOSIS — F325 Major depressive disorder, single episode, in full remission: Secondary | ICD-10-CM

## 2020-11-21 DIAGNOSIS — R87810 Cervical high risk human papillomavirus (HPV) DNA test positive: Secondary | ICD-10-CM

## 2020-11-21 DIAGNOSIS — M19041 Primary osteoarthritis, right hand: Secondary | ICD-10-CM

## 2020-11-21 DIAGNOSIS — R768 Other specified abnormal immunological findings in serum: Secondary | ICD-10-CM

## 2020-11-21 DIAGNOSIS — K581 Irritable bowel syndrome with constipation: Secondary | ICD-10-CM

## 2020-11-21 DIAGNOSIS — M797 Fibromyalgia: Secondary | ICD-10-CM

## 2020-11-26 NOTE — Progress Notes (Signed)
Office Visit Note  Patient: Deborah Navarro             Date of Birth: June 11, 1981           MRN: [id number]             PCP: Alba Cory, MD Referring: Alba Cory, MD Visit Date: 12/03/2020 Occupation: @GUAROCC @  Subjective:  Dry mouth and fatigue   History of Present Illness: Deborah Navarro is a 40 y.o. female with a history of dry mouth and fatigue.  She returns today after 1 year.  She states her symptoms have not changed.  She continues to have fatigue and dry mouth.  She is involved in a drug study now for frequency of urination.  Which has been helpful.  She denies any history of oral ulcers, nasal ulcers, malar rash, photosensitivity, Raynaud's phenomenon or inflammatory arthritis.  She continues to have some generalized pain and discomfort from fibromyalgia.  She states the Lyrica has caused a lot of weight gain.  Activities of Daily Living:  Patient reports morning stiffness for 15-20 minutes.   Patient Denies nocturnal pain.  Difficulty dressing/grooming: Denies Difficulty climbing stairs: Denies Difficulty getting out of chair: Denies Difficulty using hands for taps, buttons, cutlery, and/or writing: Denies  Review of Systems  Constitutional:  Positive for fatigue.  HENT:  Positive for mouth dryness. Negative for mouth sores and nose dryness.   Eyes:  Negative for pain, itching and dryness.  Respiratory:  Negative for shortness of breath and difficulty breathing.   Cardiovascular:  Negative for chest pain and palpitations.  Gastrointestinal:  Positive for constipation. Negative for blood in stool and diarrhea.  Endocrine: Positive for increased urination.  Genitourinary:  Negative for difficulty urinating.  Musculoskeletal:  Positive for myalgias, morning stiffness, muscle tenderness and myalgias. Negative for joint pain, joint pain and joint swelling.  Skin:  Positive for color change. Negative for rash and redness.  Allergic/Immunologic: Negative  for susceptible to infections.  Neurological:  Positive for numbness and weakness. Negative for dizziness, headaches and memory loss.  Hematological:  Negative for bruising/bleeding tendency.  Psychiatric/Behavioral:  Negative for confusion.    PMFS History:  Patient Active Problem List   Diagnosis Date Noted   Cervical disc disease 03/07/2020   Irritable bowel syndrome (IBS) 09/26/2019   Mood disorder (HCC) 08/05/2018   Papanicolaou smear of cervix with positive high risk human papilloma virus (HPV) test 05/04/2018   False positive ana 04/28/2018   Pain in both feet 04/28/2018   Facial rash 04/07/2018   Chronic fatigue 04/07/2018   Chronic neck pain 03/27/2016   Myalgia 03/27/2016   Left-sided headache 03/27/2016   Family history of early CAD 03/15/2015   History of colon polyps 06/30/2010   History of anemia 06/30/2010   Depression, major, in remission (HCC) 06/30/2010   Endometriosis 06/30/2010   CARDIAC MURMUR 06/30/2010   History of chicken pox 06/30/2010    Past Medical History:  Diagnosis Date   Anemia, unspecified    ASD (atrial septal defect)    repaired 1996   Blood transfusion, without reported diagnosis    Depressive disorder, not elsewhere classified    Elevated blood pressure reading without diagnosis of hypertension    Endometriosis of fallopian tube    Family history of adverse reaction to anesthesia    Father - PONV   Irritable bowel syndrome    PONV (postoperative nausea and vomiting)    Streptococcal meningitis    Undiagnosed cardiac murmurs  Unspecified personal history presenting hazards to health    Wears contact lenses     Family History  Problem Relation Age of Onset   Cancer Mother 79       non hodgkins lymphoma(mets to brain)   Coronary artery disease Father    Hypertension Father    Hyperlipidemia Father    Diabetes Father    Diabetes Maternal Grandmother    Heart attack Maternal Grandfather    Other Maternal Grandfather         Deterioration of Jaw   Stroke Paternal Grandmother    Stroke Paternal Grandfather    Healthy Son    Past Surgical History:  Procedure Laterality Date   COLONOSCOPY WITH PROPOFOL N/A 05/16/2018   Procedure: COLONOSCOPY WITH PROPOFOL;  Surgeon: Midge Minium, MD;  Location: [facility];  Service: Endoscopy;  Laterality: N/A;   DILATION AND CURETTAGE OF UTERUS     LAPAROSCOPIC ENDOMETRIOSIS FULGURATION  10-1999   endometriosis   NECK SURGERY N/A 06/14/15   due to C5/6 C6-7 disc herniation    open heart surgery  12-1994   asd repair   Social History   Social History Narrative   Working 3 jobs currently   Immunization History  Administered Date(s) Administered   Influenza Split 02/26/2011   Influenza,inj,Quad PF,6+ Mos 03/15/2015, 03/23/2018   Influenza-Unspecified 03/23/2019, 05/07/2020   Tdap 03/15/2015     Objective: Vital Signs: BP 128/85 (BP Location: Left Arm, Patient Position: Sitting, Cuff Size: Normal)   Pulse 83   Resp 16   Ht 5' 3.5" (1.613 m)   Wt 212 lb 12.8 oz (96.5 kg)   BMI 37.10 kg/m    Physical Exam Vitals and nursing note reviewed.  Constitutional:      Appearance: She is well-developed.  HENT:     Head: Normocephalic and atraumatic.  Eyes:     Conjunctiva/sclera: Conjunctivae normal.  Cardiovascular:     Rate and Rhythm: Normal rate and regular rhythm.     Heart sounds: Normal heart sounds.  Pulmonary:     Effort: Pulmonary effort is normal.     Breath sounds: Normal breath sounds.  Abdominal:     General: Bowel sounds are normal.     Palpations: Abdomen is soft.  Musculoskeletal:     Cervical back: Normal range of motion.  Lymphadenopathy:     Cervical: No cervical adenopathy.  Skin:    General: Skin is warm and dry.     Capillary Refill: Capillary refill takes less than 2 seconds.  Neurological:     Mental Status: She is alert and oriented to person, place, and time.  Psychiatric:        Behavior: Behavior normal.      Musculoskeletal Exam: C-spine was in good range of motion.  Shoulder joints, elbow joints, wrist joints, MCPs PIPs and DIPs with good range of motion with no synovitis.  Hip joints, knee joints, ankles, MTPs and PIPs with good range of motion with no synovitis.  CDAI Exam: CDAI Score: -- Patient Global: --; Provider Global: -- Swollen: --; Tender: -- Joint Exam 12/03/2020   No joint exam has been documented for this visit   There is currently no information documented on the homunculus. Go to the Rheumatology activity and complete the homunculus joint exam.  Investigation: No additional findings.  Imaging: No results found.  Recent Labs: Lab Results  Component Value Date   WBC 6.4 09/25/2020   HGB 12.1 09/25/2020   PLT 286 09/25/2020  NA 139 09/25/2020   K 4.0 09/25/2020   CL 103 09/25/2020   CO2 29 09/25/2020   GLUCOSE 87 09/25/2020   BUN 18 09/25/2020   CREATININE 0.94 09/25/2020   BILITOT 0.3 09/25/2020   ALKPHOS 59 03/23/2019   AST 18 09/25/2020   ALT 22 09/25/2020   PROT 7.2 09/25/2020   ALBUMIN 4.1 03/23/2019   CALCIUM 9.4 09/25/2020   GFRAA 87 09/25/2020    Speciality Comments: No specialty comments available.  Procedures:  No procedures performed Allergies: Patient has no known allergies.   Assessment / Plan:     Visit Diagnoses: Positive ANA (antinuclear antibody) - ANA 1: 80NS, ENA negative, C4 low, history of sicca symptoms, photosensitivity.  She continues to have sicca symptoms.  She also complains of fatigue and photosensitivity.  There is no history of oral ulcers, nasal ulcers, malar rash, lymphadenopathy or inflammatory arthritis.  I will obtain labs today.  I will call her back with the results.  She has been advised to contact me if she she develops any new symptoms.  Her sicca symptoms are manageable with increased water intake and over-the-counter products.  Primary osteoarthritis of both hands - Mild.  She currently does not have any  discomfort.  Primary osteoarthritis of both feet - Mild osteoarthritic changes were noted on the x-rays.  Fibromyalgia-she continues to have some generalized pain and discomfort and positive tender points.  Chronic fatigue-she had COVID-19 infection in January.  She states she had been experiencing increased fatigue since then.  Irritable bowel syndrome with constipation  Papanicolaou smear of cervix with positive high risk human papilloma virus (HPV) test  Depression, major, in remission (HCC)  Urinary frequency-patient complains of urinary frequency and nocturnal diuresis.  She is in a study currently.  She has been followed by urologist.  Orders: Orders Placed This Encounter  Procedures   Sjogrens syndrome-A extractable nuclear antibody   Sjogrens syndrome-B extractable nuclear antibody   Anti-DNA antibody, double-stranded   C3 and C4   ANA    No orders of the defined types were placed in this encounter.    Follow-Up Instructions: Return in about 1 year (around 12/03/2021) for +ANA.   Pollyann Savoy, MD  Note - This record has been created using Animal nutritionist.  Chart creation errors have been sought, but may not always  have been located. Such creation errors do not reflect on  the standard of medical care.

## 2020-12-03 ENCOUNTER — Other Ambulatory Visit: Payer: Self-pay

## 2020-12-03 ENCOUNTER — Encounter: Payer: Self-pay | Admitting: Rheumatology

## 2020-12-03 ENCOUNTER — Ambulatory Visit (INDEPENDENT_AMBULATORY_CARE_PROVIDER_SITE_OTHER): Payer: BC Managed Care – PPO | Admitting: Rheumatology

## 2020-12-03 VITALS — BP 128/85 | HR 83 | Resp 16 | Ht 63.5 in | Wt 212.8 lb

## 2020-12-03 DIAGNOSIS — M19042 Primary osteoarthritis, left hand: Secondary | ICD-10-CM

## 2020-12-03 DIAGNOSIS — R768 Other specified abnormal immunological findings in serum: Secondary | ICD-10-CM | POA: Diagnosis not present

## 2020-12-03 DIAGNOSIS — M19041 Primary osteoarthritis, right hand: Secondary | ICD-10-CM

## 2020-12-03 DIAGNOSIS — M19072 Primary osteoarthritis, left ankle and foot: Secondary | ICD-10-CM

## 2020-12-03 DIAGNOSIS — M19071 Primary osteoarthritis, right ankle and foot: Secondary | ICD-10-CM | POA: Diagnosis not present

## 2020-12-03 DIAGNOSIS — R5382 Chronic fatigue, unspecified: Secondary | ICD-10-CM

## 2020-12-03 DIAGNOSIS — F325 Major depressive disorder, single episode, in full remission: Secondary | ICD-10-CM

## 2020-12-03 DIAGNOSIS — M797 Fibromyalgia: Secondary | ICD-10-CM

## 2020-12-03 DIAGNOSIS — K581 Irritable bowel syndrome with constipation: Secondary | ICD-10-CM

## 2020-12-03 DIAGNOSIS — R350 Frequency of micturition: Secondary | ICD-10-CM

## 2020-12-03 DIAGNOSIS — R87810 Cervical high risk human papillomavirus (HPV) DNA test positive: Secondary | ICD-10-CM

## 2020-12-04 LAB — ANTI-DNA ANTIBODY, DOUBLE-STRANDED: ds DNA Ab: <1 IU/mL

## 2020-12-04 LAB — ANTI-NUCLEAR AB-TITER (ANA TITER): ANA Titer 1: 1:80 {titer} — ABNORMAL HIGH

## 2020-12-04 LAB — C3 AND C4
C3 Complement: 132 mg/dL (ref 83–193)
C4 Complement: 15 mg/dL (ref 15–57)

## 2020-12-04 LAB — SJOGRENS SYNDROME-B EXTRACTABLE NUCLEAR ANTIBODY: SSB (La) (ENA) Antibody, IgG: <1 AI

## 2020-12-04 LAB — ANA: Anti Nuclear Antibody (ANA): POSITIVE — AB

## 2020-12-04 LAB — SJOGRENS SYNDROME-A EXTRACTABLE NUCLEAR ANTIBODY: SSA (Ro) (ENA) Antibody, IgG: <1 AI

## 2020-12-09 ENCOUNTER — Ambulatory Visit: Payer: BC Managed Care – PPO | Admitting: Urology

## 2020-12-30 ENCOUNTER — Ambulatory Visit (INDEPENDENT_AMBULATORY_CARE_PROVIDER_SITE_OTHER): Payer: BC Managed Care – PPO | Admitting: Urology

## 2020-12-30 ENCOUNTER — Other Ambulatory Visit: Payer: Self-pay

## 2020-12-30 VITALS — BP 135/80 | HR 82 | Wt 212.0 lb

## 2020-12-30 DIAGNOSIS — N3946 Mixed incontinence: Secondary | ICD-10-CM | POA: Diagnosis not present

## 2020-12-30 MED ORDER — SOLIFENACIN SUCCINATE 5 MG PO TABS: 5.0000 mg | ORAL_TABLET | Freq: Every day | ORAL | 11 refills | Status: DC

## 2020-12-30 MED ORDER — OXYBUTYNIN CHLORIDE ER 10 MG PO TB24: 10.0000 mg | ORAL_TABLET | Freq: Every day | ORAL | 11 refills | Status: DC

## 2020-12-30 NOTE — Progress Notes (Signed)
12/30/2020 2:47 PM   Deborah Navarro February 28, 1981 [id number]  Referring provider: Alba Cory, MD [address] [place],  [place] [address]  Chief Complaint  Patient presents with   Urinary Incontinence    HPI: I was consulted to assess the patient's frequency and mild urinary incontinence.  She voids every 1-2 hours.  She can have urge incontinence and stress incontinence.  Does not wear a pad.  Has had rare bedwetting.  Flow was good.    Grade 2 hypermobility the bladder neck and negative cough test.  Good spring back affect.  No prolapse  Patient has mild mixed incontinence. stress incontinence worse but both are mild I will hold off for urodynamics at this stage.  Role of medical and behavioral therapy discussed.     Reassess in 6 weeks on Myrbetriq 50 mg samples and prescription.  She did not want physical therapy.  She understands that she may need urodynamics in the future although details of test was not discussed  Today Myrbetriq helped the nocturia but still has mild mixed incontinence.  Pathophysiology discussed.  Role of urodynamics in detail discussed.  She understands and goes to [place].     PMH: Past Medical History:  Diagnosis Date   Anemia, unspecified    ASD (atrial septal defect)    repaired 1996   Blood transfusion, without reported diagnosis    Depressive disorder, not elsewhere classified    Elevated blood pressure reading without diagnosis of hypertension    Endometriosis of fallopian tube    Family history of adverse reaction to anesthesia    Father - PONV   Irritable bowel syndrome    PONV (postoperative nausea and vomiting)    Streptococcal meningitis    Undiagnosed cardiac murmurs    Unspecified personal history presenting hazards to health    Wears contact lenses     Surgical History: Past Surgical History:  Procedure Laterality Date   COLONOSCOPY WITH PROPOFOL N/A 05/16/2018   Procedure: COLONOSCOPY WITH  PROPOFOL;  Surgeon: Midge Minium, MD;  Location: [facility];  Service: Endoscopy;  Laterality: N/A;   DILATION AND CURETTAGE OF UTERUS     LAPAROSCOPIC ENDOMETRIOSIS FULGURATION  10-1999   endometriosis   NECK SURGERY N/A 06/14/15   due to C5/6 C6-7 disc herniation    open heart surgery  12-1994   asd repair    Home Medications:  Allergies as of 12/30/2020   No Known Allergies      Medication List        Accurate as of December 30, 2020  2:47 PM. If you have any questions, ask your nurse or doctor.          B-12 1000 MCG Subl Place 1 tablet under the tongue daily.   cariprazine 1.5 MG capsule Commonly known as: Vraylar Take 1 capsule (1.5 mg total) by mouth daily.   FISH OIL PO Take by mouth daily.   GINGER ROOT PO Take by mouth.   meloxicam 15 MG tablet Commonly known as: MOBIC Take 1 tablet (15 mg total) by mouth daily.   mirabegron ER 50 MG Tb24 tablet Commonly known as: MYRBETRIQ Take 1 tablet (50 mg total) by mouth daily.   modafinil 100 MG tablet Commonly known as: PROVIGIL Take 1 tablet (100 mg total) by mouth daily.   pregabalin 200 MG capsule Commonly known as: LYRICA Take 1 capsule (200 mg total) by mouth 3 (three) times daily.   TART CHERRY PO Take by mouth  daily.   TURMERIC PO Take by mouth daily.   Vitamin D (Ergocalciferol) 1.25 MG (50000 UNIT) Caps capsule Commonly known as: DRISDOL TAKE 1 CAPSULE BY MOUTH EVERY 7 DAYS        Allergies: No Known Allergies  Family History: Family History  Problem Relation Age of Onset   Cancer Mother 64       non hodgkins lymphoma(mets to brain)   Coronary artery disease Father    Hypertension Father    Hyperlipidemia Father    Diabetes Father    Diabetes Maternal Grandmother    Heart attack Maternal Grandfather    Other Maternal Grandfather        Deterioration of Jaw   Stroke Paternal Grandmother    Stroke Paternal Grandfather    Healthy Son     Social History:  reports that  she has never smoked. She has never used smokeless tobacco. She reports previous alcohol use. She reports that she does not use drugs.  ROS:                                        Physical Exam: There were no vitals taken for this visit.  Constitutional:  Alert and oriented, No acute distress. HEENT:  AT, moist mucus membranes.  Trachea midline, no masses.   Laboratory Data: Lab Results  Component Value Date   WBC 6.4 09/25/2020   HGB 12.1 09/25/2020   HCT 37.7 09/25/2020   MCV 90.4 09/25/2020   PLT 286 09/25/2020    Lab Results  Component Value Date   CREATININE 0.94 09/25/2020    No results found for: PSA  No results found for: TESTOSTERONE  Lab Results  Component Value Date   HGBA1C 5.4 03/23/2019    Urinalysis    Component Value Date/Time   COLORURINE YELLOW 09/07/2019 1055   APPEARANCEUR Cloudy (A) 10/28/2020 1309   LABSPEC 1.009 09/07/2019 1055   PHURINE 8.0 09/07/2019 1055   GLUCOSEU Negative 10/28/2020 1309   HGBUR NEGATIVE 09/07/2019 1055   BILIRUBINUR Negative 10/28/2020 1309   KETONESUR NEGATIVE 09/07/2019 1055   PROTEINUR Negative 10/28/2020 1309   PROTEINUR NEGATIVE 09/07/2019 1055   UROBILINOGEN 0.2 09/07/2019 1003   UROBILINOGEN 0.2 02/28/2014 2052   NITRITE Negative 10/28/2020 1309   NITRITE NEGATIVE 09/07/2019 1055   LEUKOCYTESUR Negative 10/28/2020 1309   LEUKOCYTESUR NEGATIVE 09/07/2019 1055    Pertinent Imaging:   Assessment & Plan: I gave her Vesicare 5 mg oxybutynin ER 10 mg prescription.  Reevaluate in 8 weeks.  If she does not reach her goal we will order urodynamics.  She agreed with plan.  new beta 3 agonist other option  There are no diagnoses linked to this encounter.  No follow-ups on file.  Martina Sinner, MD  [facility] [address] [place], [place] [address] [phone]

## 2021-02-24 ENCOUNTER — Ambulatory Visit (INDEPENDENT_AMBULATORY_CARE_PROVIDER_SITE_OTHER): Payer: BC Managed Care – PPO | Admitting: Urology

## 2021-02-24 ENCOUNTER — Other Ambulatory Visit: Payer: Self-pay

## 2021-02-24 VITALS — BP 137/84 | HR 84

## 2021-02-24 DIAGNOSIS — N3946 Mixed incontinence: Secondary | ICD-10-CM | POA: Diagnosis not present

## 2021-02-24 NOTE — Progress Notes (Signed)
02/24/2021 2:56 PM   Deborah Navarro 1980-09-30 [id number]  Referring provider: Alba Cory, MD [address] [place],  [place] [address]  Chief Complaint  Patient presents with   Urinary Incontinence    HPI: I was consulted to assess the patient's frequency and mild urinary incontinence.  She voids every 1-2 hours.  She can have urge incontinence and stress incontinence.  Does not wear a pad.  Has had rare bedwetting.  Flow was good.     Grade 2 hypermobility the bladder neck and negative cough test.  Good spring back affect.  No prolapse   Patient has mild mixed incontinence. stress incontinence worse but both are mild I will hold off for urodynamics at this stage.  Role of medical and behavioral therapy discussed.     Reassess in 6 weeks on Myrbetriq 50 mg samples and prescription.  She did not want physical therapy.  She understands that she may need urodynamics in the future although details of test was not discussed   Myrbetriq helped the nocturia but still has mild mixed incontinence.  Pathophysiology discussed.  Role of urodynamics in detail discussed.  She understands and goes to [place].   I gave her Vesicare 5 mg oxybutynin ER 10 mg prescription.  Reevaluate in 8 weeks.  If she does not reach her goal we will order urodynamics.  She agreed with plan.  new beta 3 agonist other option  Today Nighttime frequency improved on Vesicare but still has mild mixed incontinence.  Failed oxybutynin.  Clinically not infected.  She probably will stay on it for now and we will order urodynamics.        PMH: Past Medical History:  Diagnosis Date   Anemia, unspecified    ASD (atrial septal defect)    repaired 1996   Blood transfusion, without reported diagnosis    Depressive disorder, not elsewhere classified    Elevated blood pressure reading without diagnosis of hypertension    Endometriosis of fallopian tube    Family history of adverse reaction to  anesthesia    Father - PONV   Irritable bowel syndrome    PONV (postoperative nausea and vomiting)    Streptococcal meningitis    Undiagnosed cardiac murmurs    Unspecified personal history presenting hazards to health    Wears contact lenses     Surgical History: Past Surgical History:  Procedure Laterality Date   COLONOSCOPY WITH PROPOFOL N/A 05/16/2018   Procedure: COLONOSCOPY WITH PROPOFOL;  Surgeon: Midge Minium, MD;  Location[facility];  Service: Endoscopy;  Laterality: N/A;   DILATION AND CURETTAGE OF UTERUS     LAPAROSCOPIC ENDOMETRIOSIS FULGURATION  10-1999   endometriosis   NECK SURGERY N/A 06/14/15   due to C5/6 C6-7 disc herniation    open heart surgery  12-1994   asd repair    Home Medications:  Allergies as of 02/24/2021   No Known Allergies      Medication List        Accurate as of February 24, 2021  2:56 PM. If you have any questions, ask your nurse or doctor.          B-12 1000 MCG Subl Place 1 tablet under the tongue daily.   cariprazine 1.5 MG capsule Commonly known as: Vraylar Take 1 capsule (1.5 mg total) by mouth daily.   FISH OIL PO Take by mouth daily.   GINGER ROOT PO Take by mouth.   meloxicam 15 MG tablet Commonly known  as: MOBIC Take 1 tablet (15 mg total) by mouth daily.   modafinil 100 MG tablet Commonly known as: PROVIGIL Take 1 tablet (100 mg total) by mouth daily.   oxybutynin 10 MG 24 hr tablet Commonly known as: DITROPAN-XL Take 1 tablet (10 mg total) by mouth daily.   pregabalin 200 MG capsule Commonly known as: LYRICA Take 1 capsule (200 mg total) by mouth 3 (three) times daily.   solifenacin 5 MG tablet Commonly known as: VESICARE Take 1 tablet (5 mg total) by mouth daily.   TART CHERRY PO Take by mouth daily.   TURMERIC PO Take by mouth daily.   Vitamin D (Ergocalciferol) 1.25 MG (50000 UNIT) Caps capsule Commonly known as: DRISDOL TAKE 1 CAPSULE BY MOUTH EVERY 7 DAYS         Allergies: No Known Allergies  Family History: Family History  Problem Relation Age of Onset   Cancer Mother 67       non hodgkins lymphoma(mets to brain)   Coronary artery disease Father    Hypertension Father    Hyperlipidemia Father    Diabetes Father    Diabetes Maternal Grandmother    Heart attack Maternal Grandfather    Other Maternal Grandfather        Deterioration of Jaw   Stroke Paternal Grandmother    Stroke Paternal Grandfather    Healthy Son     Social History:  reports that she has never smoked. She has never used smokeless tobacco. She reports that she does not currently use alcohol. She reports that she does not use drugs.  ROS:                                        Physical Exam: There were no vitals taken for this visit.  Constitutional:  Alert and oriented, No acute distress. HEENT: [place] AT, moist mucus membranes.  Trachea midline, no masses.   Laboratory Data: Lab Results  Component Value Date   WBC 6.4 09/25/2020   HGB 12.1 09/25/2020   HCT 37.7 09/25/2020   MCV 90.4 09/25/2020   PLT 286 09/25/2020    Lab Results  Component Value Date   CREATININE 0.94 09/25/2020    No results found for: PSA  No results found for: TESTOSTERONE  Lab Results  Component Value Date   HGBA1C 5.4 03/23/2019    Urinalysis    Component Value Date/Time   COLORURINE YELLOW 09/07/2019 1055   APPEARANCEUR Cloudy (A) 10/28/2020 1309   LABSPEC 1.009 09/07/2019 1055   PHURINE 8.0 09/07/2019 1055   GLUCOSEU Negative 10/28/2020 1309   HGBUR NEGATIVE 09/07/2019 1055   BILIRUBINUR Negative 10/28/2020 1309   KETONESUR NEGATIVE 09/07/2019 1055   PROTEINUR Negative 10/28/2020 1309   PROTEINUR NEGATIVE 09/07/2019 1055   UROBILINOGEN 0.2 09/07/2019 1003   UROBILINOGEN 0.2 02/28/2014 2052   NITRITE Negative 10/28/2020 1309   NITRITE NEGATIVE 09/07/2019 1055   LEUKOCYTESUR Negative 10/28/2020 1309   LEUKOCYTESUR NEGATIVE 09/07/2019 1055     Pertinent Imaging:   Assessment & Plan: Urodynamics ordered  There are no diagnoses linked to this encounter.  No follow-ups on file.  Martina Sinner, MD  [facility] [address] [place], [place] [address] [phone]

## 2021-03-11 ENCOUNTER — Other Ambulatory Visit: Payer: Self-pay | Admitting: Family Medicine

## 2021-03-11 DIAGNOSIS — F39 Unspecified mood [affective] disorder: Secondary | ICD-10-CM

## 2021-03-11 DIAGNOSIS — M797 Fibromyalgia: Secondary | ICD-10-CM

## 2021-03-11 DIAGNOSIS — M792 Neuralgia and neuritis, unspecified: Secondary | ICD-10-CM

## 2021-03-25 ENCOUNTER — Encounter: Payer: Self-pay | Admitting: Family Medicine

## 2021-03-25 ENCOUNTER — Other Ambulatory Visit: Payer: Self-pay

## 2021-03-25 ENCOUNTER — Ambulatory Visit (INDEPENDENT_AMBULATORY_CARE_PROVIDER_SITE_OTHER): Payer: BC Managed Care – PPO | Admitting: Family Medicine

## 2021-03-25 VITALS — BP 122/80 | HR 92 | Temp 97.6°F | Resp 16 | Ht 64.0 in | Wt 215.0 lb

## 2021-03-25 DIAGNOSIS — E538 Deficiency of other specified B group vitamins: Secondary | ICD-10-CM | POA: Diagnosis not present

## 2021-03-25 DIAGNOSIS — E785 Hyperlipidemia, unspecified: Secondary | ICD-10-CM

## 2021-03-25 DIAGNOSIS — M79671 Pain in right foot: Secondary | ICD-10-CM

## 2021-03-25 DIAGNOSIS — Z79899 Other long term (current) drug therapy: Secondary | ICD-10-CM

## 2021-03-25 DIAGNOSIS — K582 Mixed irritable bowel syndrome: Secondary | ICD-10-CM

## 2021-03-25 DIAGNOSIS — M792 Neuralgia and neuritis, unspecified: Secondary | ICD-10-CM

## 2021-03-25 DIAGNOSIS — Z131 Encounter for screening for diabetes mellitus: Secondary | ICD-10-CM | POA: Diagnosis not present

## 2021-03-25 DIAGNOSIS — R350 Frequency of micturition: Secondary | ICD-10-CM | POA: Diagnosis not present

## 2021-03-25 DIAGNOSIS — M79672 Pain in left foot: Secondary | ICD-10-CM

## 2021-03-25 DIAGNOSIS — R635 Abnormal weight gain: Secondary | ICD-10-CM

## 2021-03-25 DIAGNOSIS — F39 Unspecified mood [affective] disorder: Secondary | ICD-10-CM | POA: Diagnosis not present

## 2021-03-25 DIAGNOSIS — R768 Other specified abnormal immunological findings in serum: Secondary | ICD-10-CM

## 2021-03-25 DIAGNOSIS — Z23 Encounter for immunization: Secondary | ICD-10-CM | POA: Diagnosis not present

## 2021-03-25 DIAGNOSIS — N3944 Nocturnal enuresis: Secondary | ICD-10-CM

## 2021-03-25 DIAGNOSIS — M797 Fibromyalgia: Secondary | ICD-10-CM

## 2021-03-25 DIAGNOSIS — E559 Vitamin D deficiency, unspecified: Secondary | ICD-10-CM

## 2021-03-25 MED ORDER — RYBELSUS 3 MG PO TABS: 3.0000 mg | ORAL_TABLET | Freq: Every day | ORAL | 0 refills | Status: DC

## 2021-03-25 MED ORDER — RYBELSUS 7 MG PO TABS: 7.0000 mg | ORAL_TABLET | Freq: Every day | ORAL | 1 refills | Status: DC

## 2021-03-25 MED ORDER — CARIPRAZINE HCL 1.5 MG PO CAPS: 1.5000 mg | ORAL_CAPSULE | Freq: Every day | ORAL | 1 refills | Status: DC

## 2021-03-25 MED ORDER — PREGABALIN 150 MG PO CAPS: 150.0000 mg | ORAL_CAPSULE | Freq: Two times a day (BID) | ORAL | 0 refills | Status: DC

## 2021-03-25 MED ORDER — MELOXICAM 15 MG PO TABS: 15.0000 mg | ORAL_TABLET | Freq: Every day | ORAL | 1 refills | Status: DC

## 2021-03-25 NOTE — Addendum Note (Signed)
Addended by: Alba Cory F on: 03/25/2021 11:15 AM   Modules accepted: Orders

## 2021-03-25 NOTE — Progress Notes (Signed)
Name: Deborah Navarro   MRN: [id number]    DOB: 02-Mar-1981   Date:03/25/2021       Progress Note  Subjective  Chief Complaint  Follow Up  HPI  Fatigue: B12 is back to normal but she continues to feel very tired. Explained likely multifactorial. She has also added biotin due to hair loss since COVID-19 ( symptoms started 05/2020). She is taking Modafinil and it really helps with her fatigue.    Feet Pain: she states pain has been going since 2021 she states started with pain on forefoot, goes from toes to ball of her feet, currently worse on left foot. She states burning like, sometimes aching, sometimes it gets red at the end of the day, she also has intermittent swelling that is worse in the morning. She has been taking Meloxicam and the fact that she changed positions at work she is not walking as much and has improved. Seen by Dr. Ether Griffins in 2020, advised orthotics but not covered by insurance    FMS: she has been taking Lyrica  200 mg TID, she has noticed weight gain. She also states having more pain lately, aching all over, gets worse with activity . She states that meloxicam also  helped. She has history of elevated ANA , sed rate and CRP and sed rate. She was seen by Dr. Al Pimple and had more labs done, she has OA hands and positive ANA and at this time to try tumeric, ginger root daily. She tried stopped Meloxicam but the pain increased significantly and she had to resume medication. She is no longer playing softball due to increase pain after intense activity    Mood Disorder: .Insurance denied [employer] initially , she has tried Abilify, Elavil  and Duloxetine but did not work well for her. We did a PA and she has been on Vraylar 1.5 mg since 2021. 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 She states that Modafinil helps with energy and motivation    IBS: long history of periods of constipation with intermittent diarrhea that is associated with bloating, and abdominal pain, no blood in stools or weight loss. She states doing better . She has bowel movements at least a couple of times a week lately, no recent episodes of diarrhea.   Urge incontinence: under the care of Dr. Sherron Monday and had a uro-dynamic study this morning. Currently taking Vesicare and it helps with nocturia ( down to one time per night), nocturnal enuresis resolved, but still has urinary frequency and stress incontinence symptoms during the day.   Morbid obesity: BMI above 35 with co-morbidities, she has OA and pain on feet, neuropathy, dyslipidemia, discussed weight loss medications. Options. Contrave, Qsymia or GLP-1 agonist . Discussed possible side effects , she would like to try oral form of GLP-1 , we will give her samples and rx of Ryblesus. Denies family history of thyroid cancer or personal history of pancreatitis.   Patient Active Problem List   Diagnosis Date Noted   Cervical disc disease 03/07/2020   Irritable bowel syndrome (IBS) 09/26/2019   Mood disorder (HCC) 08/05/2018   Papanicolaou smear of cervix with positive high risk human papilloma virus (HPV) test 05/04/2018   False positive ana 04/28/2018   Pain in both feet 04/28/2018   Facial rash 04/07/2018   Chronic fatigue 04/07/2018   Chronic neck pain  03/27/2016   Myalgia 03/27/2016   Left-sided headache 03/27/2016   Family history of early CAD 03/15/2015   History of colon polyps 06/30/2010   History of anemia 06/30/2010   Depression, major, in remission (HCC) 06/30/2010   Endometriosis 06/30/2010   CARDIAC MURMUR 06/30/2010   History of chicken pox 06/30/2010    Past Surgical History:  Procedure Laterality Date   COLONOSCOPY WITH PROPOFOL N/A 05/16/2018   Procedure: COLONOSCOPY WITH PROPOFOL;  Surgeon: Midge Minium, MD;  Location: [facility];  Service: Endoscopy;  Laterality: N/A;    DILATION AND CURETTAGE OF UTERUS     LAPAROSCOPIC ENDOMETRIOSIS FULGURATION  10-1999   endometriosis   NECK SURGERY N/A 06/14/15   due to C5/6 C6-7 disc herniation    open heart surgery  12-1994   asd repair    Family History  Problem Relation Age of Onset   Cancer Mother 11       non hodgkins lymphoma(mets to brain)   Coronary artery disease Father    Hypertension Father    Hyperlipidemia Father    Diabetes Father    Diabetes Maternal Grandmother    Heart attack Maternal Grandfather    Other Maternal Grandfather        Deterioration of Jaw   Stroke Paternal Grandmother    Stroke Paternal Grandfather    Healthy Son     Social History   Tobacco Use   Smoking status: Never   Smokeless tobacco: Never  Substance Use Topics   Alcohol use: Not Currently     Current Outpatient Medications:    cariprazine (VRAYLAR) capsule, Take 1 capsule (1.5 mg total) by mouth daily., Disp: 90 capsule, Rfl: 1   Cyanocobalamin (B-12) 1000 MCG SUBL, Place 1 tablet under the tongue daily., Disp: 30 tablet, Rfl: 0   Ginger, Zingiber officinalis, (GINGER ROOT PO), Take by mouth., Disp: , Rfl:    meloxicam (MOBIC) 15 MG tablet, Take 1 tablet (15 mg total) by mouth daily., Disp: 90 tablet, Rfl: 1   modafinil (PROVIGIL) 100 MG tablet, TAKE 1 TABLET(100 MG) BY MOUTH DAILY, Disp: 90 tablet, Rfl: 0   Omega-3 Fatty Acids (FISH OIL PO), Take by mouth daily., Disp: , Rfl:    pregabalin (LYRICA) 200 MG capsule, TAKE 1 CAPSULE(200 MG) BY MOUTH THREE TIMES DAILY, Disp: 270 capsule, Rfl: 0   solifenacin (VESICARE) 5 MG tablet, Take 1 tablet (5 mg total) by mouth daily., Disp: 30 tablet, Rfl: 11   TART CHERRY PO, Take by mouth daily., Disp: , Rfl:    TURMERIC PO, Take by mouth daily., Disp: , Rfl:    Vitamin D, Ergocalciferol, (DRISDOL) 1.25 MG (50000 UNIT) CAPS capsule, TAKE 1 CAPSULE BY MOUTH EVERY 7 DAYS, Disp: 12 capsule, Rfl: 1  No Known Allergies  I personally reviewed active problem list, medication  list, allergies, family history, social history, health maintenance with the patient/caregiver today.   ROS  Constitutional: Negative for fever , positive for weight change.  Respiratory: Negative for cough and shortness of breath.   Cardiovascular: Negative for chest pain or palpitations.  Gastrointestinal: Negative for abdominal pain, no bowel changes.  Musculoskeletal: Negative for gait problem or joint swelling.  Skin: Negative for rash.  Neurological: Negative for dizziness or headache.  No other specific complaints in a complete review of systems (except as listed in HPI above).   Objective  Vitals:   03/25/21 0951  BP: 122/80  Pulse: 92  Resp: 16  Temp: 97.6 F (36.4 C)  SpO2: 99%  Weight: 215 lb (97.5 kg)  Height: 5\' 4"  (1.626 m)    Body mass index is 36.9 kg/m.  Physical Exam  Constitutional: Patient appears well-developed and well-nourished. Obese  No distress.  HEENT: head atraumatic, normocephalic, pupils equal and reactive to light, neck supple Cardiovascular: Normal rate, regular rhythm and normal heart sounds.  No murmur heard. No BLE edema. Muscular Skeletal: trigger point positive  Pulmonary/Chest: Effort normal and breath sounds normal. No respiratory distress. Abdominal: Soft.  There is no tenderness. Psychiatric: Patient has a normal mood and affect. behavior is normal. Judgment and thought content normal.    PHQ2/9: Depression screen [facility] 2/9 03/25/2021 09/25/2020 03/07/2020 12/20/2019 09/26/2019  Decreased Interest 1 1 1  0 1  Down, Depressed, Hopeless 0 0 0 0 0  PHQ - 2 Score 1 1 1  0 1  Altered sleeping 3 3 - 0 1  Tired, decreased energy 3 3 - 3 3  Change in appetite 0 3 - 0 1  Feeling bad or failure about yourself  0 0 - 0 0  Trouble concentrating 0 0 - 0 1  Moving slowly or fidgety/restless 0 0 - 0 0  Suicidal thoughts 0 0 - 0 0  PHQ-9 Score 7 10 - 3 7  Difficult doing work/chores - - - Not difficult at all Somewhat difficult  Some recent data  might be hidden    phq 9 is negative   Fall Risk: Fall Risk  03/25/2021 09/25/2020 06/25/2020 03/07/2020 12/20/2019  Falls in the past year? 0 0 0 0 0  Number falls in past yr: 0 0 0 0 0  Injury with Fall? 0 0 0 0 0  Risk for fall due to : No Fall Risks - - - -  Follow up Falls prevention discussed - - Falls evaluation completed -      Functional Status Survey: Is the patient deaf or have difficulty hearing?: No Does the patient have difficulty seeing, even when wearing glasses/contacts?: No Does the patient have difficulty concentrating, remembering, or making decisions?: No Does the patient have difficulty walking or climbing stairs?: No Does the patient have difficulty dressing or bathing?: No Does the patient have difficulty doing errands alone such as visiting a doctor's office or shopping?: No    Assessment & Plan  1. Need for immunization against influenza  - Flu Vaccine QUAD 22mo+IM (Fluarix, Fluzone & Alfiuria Quad PF)  2. Mood disorder (HCC)  - cariprazine (VRAYLAR) 1.5 MG capsule; Take 1 capsule (1.5 mg total) by mouth daily.  Dispense: 90 capsule; Refill: 1 - pregabalin (LYRICA) 150 MG capsule; Take 1 capsule (150 mg total) by mouth 2 (two) times daily.  Dispense: 180 capsule; Refill: 0  3. Fibromyalgia syndrome  - pregabalin (LYRICA) 150 MG capsule; Take 1 capsule (150 mg total) by mouth 2 (two) times daily.  Dispense: 180 capsule; Refill: 0 - meloxicam (MOBIC) 15 MG tablet; Take 1 tablet (15 mg total) by mouth daily.  Dispense: 90 tablet; Refill: 1  4. Neuropathic pain  - pregabalin (LYRICA) 150 MG capsule; Take 1 capsule (150 mg total) by mouth 2 (two) times daily.  Dispense: 180 capsule; Refill: 0  5. High risk medication use  Check labs A1C, lipid panel   6. Positive ANA (antinuclear antibody)   7. B12 deficiency  Recheck B12 it was high last visit   8. Dyslipidemia   9. Vitamin D deficiency   10. Irritable bowel syndrome with both  constipation and diarrhea  11. Nocturnal enuresis  Keep follow up with Urologist   12. Weight gain  - Semaglutide (RYBELSUS) 7 MG TABS; Take 7 mg by mouth daily.  Dispense: 30 tablet; Refill: 1  13. Pain in both feet  - meloxicam (MOBIC) 15 MG tablet; Take 1 tablet (15 mg total) by mouth daily.  Dispense: 90 tablet; Refill: 1  14. Morbid obesity (HCC)  Discussed with the patient the risk posed by an increased BMI. Discussed importance of portion control, calorie counting and at least 150 minutes of physical activity weekly. Avoid sweet beverages and drink more water. Eat at least 6 servings of fruit and vegetables daily   - Semaglutide (RYBELSUS) 7 MG TABS; Take 7 mg by mouth daily.  Dispense: 30 tablet; Refill: 1

## 2021-03-26 LAB — HEMOGLOBIN A1C
Hgb A1c MFr Bld: 5.7 % of total Hgb — ABNORMAL HIGH (ref <5.7)
Mean Plasma Glucose: 117 mg/dL
eAG (mmol/L): 6.5 mmol/L

## 2021-03-26 LAB — VITAMIN B12: Vitamin B-12: 1278 pg/mL — ABNORMAL HIGH (ref 200–1100)

## 2021-03-26 LAB — LIPID PANEL
Cholesterol: 216 mg/dL — ABNORMAL HIGH (ref <200)
HDL: 50 mg/dL (ref >=50)
LDL Cholesterol (Calc): 144 mg/dL (calc) — ABNORMAL HIGH
Non-HDL Cholesterol (Calc): 166 mg/dL (calc) — ABNORMAL HIGH (ref <130)
Total CHOL/HDL Ratio: 4.3 (calc) (ref <5.0)
Triglycerides: 108 mg/dL (ref <150)

## 2021-04-01 ENCOUNTER — Other Ambulatory Visit: Payer: Self-pay | Admitting: Urology

## 2021-04-07 ENCOUNTER — Ambulatory Visit (INDEPENDENT_AMBULATORY_CARE_PROVIDER_SITE_OTHER): Payer: BC Managed Care – PPO | Admitting: Urology

## 2021-04-07 ENCOUNTER — Other Ambulatory Visit: Payer: Self-pay

## 2021-04-07 ENCOUNTER — Encounter: Payer: Self-pay | Admitting: Urology

## 2021-04-07 VITALS — BP 138/83 | HR 81 | Ht 64.0 in | Wt 215.0 lb

## 2021-04-07 DIAGNOSIS — N3946 Mixed incontinence: Secondary | ICD-10-CM

## 2021-04-07 MED ORDER — SOLIFENACIN SUCCINATE 10 MG PO TABS: 10.0000 mg | ORAL_TABLET | Freq: Every day | ORAL | 11 refills | Status: DC

## 2021-04-07 NOTE — Progress Notes (Signed)
04/07/2021 9:16 AM   Deborah Navarro [birth year] [id number]  Referring provider: Alba Cory, MD [address] [place],  [place] [address]  Chief Complaint  Patient presents with   Urinary Incontinence    HPI: I was consulted to assess the patient's frequency and mild urinary incontinence.  She voids every 1-2 hours.  She can have urge incontinence and stress incontinence.  Does not wear a pad.  Has had rare bedwetting.  Flow was good.     Grade 2 hypermobility the bladder neck and negative cough test.  Good spring back affect.  No prolapse   Patient has mild mixed incontinence. stress incontinence worse but both are mild I will hold off for urodynamics at this stage.  Role of medical and behavioral therapy discussed.     Reassess in 6 weeks on Myrbetriq 50 mg samples and prescription.  She did not want physical therapy.  She understands that she may need urodynamics in the future although details of test was not discussed   Myrbetriq helped the nocturia but still has mild mixed incontinence.  Pathophysiology discussed.  Role of urodynamics in detail discussed.  She understands and goes to [place].    I gave her Vesicare 5 mg oxybutynin ER 10 mg prescription.  Reevaluate in 8 weeks.  If she does not reach her goal we will order urodynamics.  She agreed with plan.  new beta 3 agonist other option   Today Nighttime frequency improved on Vesicare but still has mild mixed incontinence.  Failed oxybutynin.  Clinically not infected.  She probably will stay on it for now and we will order urodynamics.  Today Frequency stable.  Incontinence stable. During urodynamics she did not void and was catheterized for 200 mL.  Maximum bladder capacity was 490 mL.  Bladder was stable.  She did not leak with a Valsalva pressure of 172 cm of water.  She could not void in the lab setting.  There was subtraction artifact I do not think the patient actually generated detrusor  contraction.  EMG activity was within normal limits.  Bladder neck descent at 1 cm.  Spinal hardware noted the details of the urodynamics are signed and dictated       PMH: Past Medical History:  Diagnosis Date   Anemia, unspecified    ASD (atrial septal defect)    repaired 1996   Blood transfusion, without reported diagnosis    Depressive disorder, not elsewhere classified    Elevated blood pressure reading without diagnosis of hypertension    Endometriosis of fallopian tube    Family history of adverse reaction to anesthesia    Father - PONV   Irritable bowel syndrome    PONV (postoperative nausea and vomiting)    Streptococcal meningitis    Undiagnosed cardiac murmurs    Unspecified personal history presenting hazards to health    Wears contact lenses     Surgical History: Past Surgical History:  Procedure Laterality Date   COLONOSCOPY WITH PROPOFOL N/A 05/16/2018   Procedure: COLONOSCOPY WITH PROPOFOL;  Surgeon: Midge Minium, MD;  [facility];  Service: Endoscopy;  Laterality: N/A;   DILATION AND CURETTAGE OF UTERUS     LAPAROSCOPIC ENDOMETRIOSIS FULGURATION  10-1999   endometriosis   NECK SURGERY N/A 06/14/15   due to C5/6 C6-7 disc herniation    open heart surgery  12-1994   asd repair    Home Medications:  Allergies as of 04/07/2021   No Known Allergies  Medication List        Accurate as of April 07, 2021  9:16 AM. If you have any questions, ask your nurse or doctor.          B-12 1000 MCG Subl Place 1 tablet under the tongue daily.   cariprazine 1.5 MG capsule Commonly known as: Vraylar Take 1 capsule (1.5 mg total) by mouth daily.   FISH OIL PO Take by mouth daily.   GINGER ROOT PO Take by mouth.   meloxicam 15 MG tablet Commonly known as: MOBIC Take 1 tablet (15 mg total) by mouth daily.   modafinil 100 MG tablet Commonly known as: PROVIGIL TAKE 1 TABLET(100 MG) BY MOUTH DAILY   pregabalin 150 MG  capsule Commonly known as: LYRICA Take 1 capsule (150 mg total) by mouth 2 (two) times daily.   Rybelsus 7 MG Tabs Generic drug: Semaglutide Take 7 mg by mouth daily.   Rybelsus 3 MG Tabs Generic drug: Semaglutide Take 3 mg by mouth daily.   solifenacin 5 MG tablet Commonly known as: VESICARE Take 1 tablet (5 mg total) by mouth daily.   TART CHERRY PO Take by mouth daily.   TURMERIC PO Take by mouth daily.   Vitamin D (Ergocalciferol) 1.25 MG (50000 UNIT) Caps capsule Commonly known as: DRISDOL TAKE 1 CAPSULE BY MOUTH EVERY 7 DAYS        Allergies: No Known Allergies  Family History: Family History  Problem Relation Age of Onset   Cancer Mother 71       non hodgkins lymphoma(mets to brain)   Coronary artery disease Father    Hypertension Father    Hyperlipidemia Father    Diabetes Father    Diabetes Maternal Grandmother    Heart attack Maternal Grandfather    Other Maternal Grandfather        Deterioration of Jaw   Stroke Paternal Grandmother    Stroke Paternal Grandfather    Healthy Son     Social History:  reports that she has never smoked. She has never used smokeless tobacco. She reports that she does not currently use alcohol. She reports that she does not use drugs.  ROS:                                        Physical Exam: There were no vitals taken for this visit.  Constitutional:  Alert and oriented, No acute distress.  Laboratory Data: Lab Results  Component Value Date   WBC 6.4 09/25/2020   HGB 12.1 09/25/2020   HCT 37.7 09/25/2020   MCV 90.4 09/25/2020   PLT 286 09/25/2020    Lab Results  Component Value Date   CREATININE 0.94 09/25/2020    No results found for: PSA  No results found for: TESTOSTERONE  Lab Results  Component Value Date   HGBA1C 5.7 (H) 03/25/2021    Urinalysis    Component Value Date/Time   COLORURINE YELLOW 09/07/2019 1055   APPEARANCEUR Cloudy (A) 10/28/2020 1309   LABSPEC  1.009 09/07/2019 1055   PHURINE 8.0 09/07/2019 1055   GLUCOSEU Negative 10/28/2020 1309   HGBUR NEGATIVE 09/07/2019 1055   BILIRUBINUR Negative 10/28/2020 1309   KETONESUR NEGATIVE 09/07/2019 1055   PROTEINUR Negative 10/28/2020 1309   PROTEINUR NEGATIVE 09/07/2019 1055   UROBILINOGEN 0.2 09/07/2019 1003   UROBILINOGEN 0.2 02/28/2014 2052   NITRITE Negative 10/28/2020 1309   NITRITE  NEGATIVE 09/07/2019 1055   LEUKOCYTESUR Negative 10/28/2020 1309   LEUKOCYTESUR NEGATIVE 09/07/2019 1055    Pertinent Imaging:   Assessment & Plan: Patient has mild mixed incontinence.  She has very mild stress incontinence and does not shown any objective evidence.  In my opinion based upon the findings a sling would not be a good option for her.  If she does have bladder a contractility she may be also at higher risk of retention.  A bulking agent would be recommended.  Patient is nocturia much better and she is pleased.  She is voiding every 1-2 hours.  If she has a hard cough or sneeze she will leak a small amount.  She agrees no surgery for this.  Main symptom is the frequency and secondarily very mild stress incontinence but she is overall much happier because she is sleeping through the night better  Reassess in 8 weeks on Vesicare 10 mg.  Dosage increase.  She understands if I have samples I can give her the new beta 3 agonist on that visit.  I did not mention bulking agent.  We agreed not to do surgery  There are no diagnoses linked to this encounter.  No follow-ups on file.  Martina Sinner, MD  [facility] [address] [place], [place] [address] [phone]

## 2021-04-15 ENCOUNTER — Other Ambulatory Visit: Payer: Self-pay | Admitting: Family Medicine

## 2021-04-15 DIAGNOSIS — Z1231 Encounter for screening mammogram for malignant neoplasm of breast: Secondary | ICD-10-CM

## 2021-05-14 ENCOUNTER — Ambulatory Visit
Admission: RE | Admit: 2021-05-14 | Discharge: 2021-05-14 | Disposition: A | Discharge status: Home / Self Care | Payer: BC Managed Care – PPO | Source: Ambulatory Visit | Attending: Family Medicine | Admitting: Family Medicine

## 2021-05-14 ENCOUNTER — Other Ambulatory Visit: Payer: Self-pay

## 2021-05-14 ENCOUNTER — Ambulatory Visit: Payer: BC Managed Care – PPO | Admitting: Obstetrics and Gynecology

## 2021-05-14 DIAGNOSIS — Z1231 Encounter for screening mammogram for malignant neoplasm of breast: Secondary | ICD-10-CM | POA: Diagnosis not present

## 2021-05-21 ENCOUNTER — Ambulatory Visit (INDEPENDENT_AMBULATORY_CARE_PROVIDER_SITE_OTHER): Payer: BC Managed Care – PPO | Admitting: Obstetrics

## 2021-05-21 ENCOUNTER — Other Ambulatory Visit (HOSPITAL_COMMUNITY)
Admission: RE | Admit: 2021-05-21 | Discharge: 2021-05-21 | Disposition: A | Discharge status: Home / Self Care | Payer: BC Managed Care – PPO | Source: Ambulatory Visit | Attending: Obstetrics | Admitting: Obstetrics

## 2021-05-21 ENCOUNTER — Other Ambulatory Visit: Payer: Self-pay

## 2021-05-21 ENCOUNTER — Encounter: Payer: Self-pay | Admitting: Obstetrics

## 2021-05-21 VITALS — BP 126/84 | Ht 63.0 in | Wt 210.0 lb

## 2021-05-21 DIAGNOSIS — Z01419 Encounter for gynecological examination (general) (routine) without abnormal findings: Secondary | ICD-10-CM | POA: Diagnosis not present

## 2021-05-21 DIAGNOSIS — Z124 Encounter for screening for malignant neoplasm of cervix: Secondary | ICD-10-CM | POA: Insufficient documentation

## 2021-05-21 NOTE — Progress Notes (Signed)
Gynecology Annual Exam  PCP: [name], MD  Chief Complaint:  Chief Complaint  Patient presents with   Annual Exam    History of Present Illness: Patient is a 40 y.o. G2P0011 presents for annual exam. The patient has no complaints today.  Aariona works at [employer]. She is married, and has one child. She reports not being sexually active.  LMP: Patient's last menstrual period was 05/01/2021 (exact date). Average Interval: regular, 28 days Duration of flow: 4 days Heavy Menses: at the beginning Clots: no Intermenstrual Bleeding: no Postcoital Bleeding: not applicable Dysmenorrhea: no   The patient is not sexually active. She currently uses none for contraception. She denies dyspareunia.  The patient does not perform self breast exams.  There is no notable family history of breast or ovarian cancer in her family.  The patient wears seatbelts: yes.   The patient has regular exercise: no.    The patient denies current symptoms of depression.    Review of Systems: Review of Systems  Constitutional:  Positive for weight loss.  HENT: Negative.    Eyes: Negative.   Respiratory: Negative.    Cardiovascular: Negative.   Gastrointestinal: Negative.   Genitourinary: Negative.   Musculoskeletal: Negative.   Skin: Negative.   Neurological: Negative.   Endo/Heme/Allergies: Negative.   Psychiatric/Behavioral: Negative.     Past Medical History:  Patient Active Problem List   Diagnosis Date Noted   Cervical disc disease 03/07/2020   Irritable bowel syndrome (IBS) 09/26/2019   Mood disorder ([place]) 08/05/2018   Papanicolaou smear of cervix with positive high risk human papilloma virus (HPV) test 05/04/2018   False positive ana 04/28/2018   Pain in both feet 04/28/2018   Facial rash 04/07/2018    SUPERFICIAL FACIAL RASH / ROSACEA/MALAR RASH?    Chronic fatigue 04/07/2018   Chronic neck pain 03/27/2016   Myalgia 03/27/2016   Left-sided headache 03/27/2016   Family  history of early CAD 03/15/2015   History of colon polyps 06/30/2010    Annotation: one was precancerous.. recommending colonoscopy every 5 years. Qualifier: Diagnosis of  By: Diona Browner MD, Amy      History of anemia 06/30/2010         Depression, major, in remission ([place]) 06/30/2010    Qualifier: History of  By: Diona Browner MD, Amy      Endometriosis 06/30/2010         CARDIAC MURMUR 06/30/2010    Qualifier: Diagnosis of  By: Diona Browner MD, Amy      History of chicken pox 06/30/2010          Past Surgical History:  Past Surgical History:  Procedure Laterality Date   COLONOSCOPY WITH PROPOFOL N/A 05/16/2018   Procedure: COLONOSCOPY WITH PROPOFOL;  Surgeon: [name], MD;  Location: [place];  Service: Endoscopy;  Laterality: N/A;   DILATION AND CURETTAGE OF UTERUS     LAPAROSCOPIC ENDOMETRIOSIS FULGURATION  10-1999   endometriosis   NECK SURGERY N/A 06/14/15   due to C5/6 C6-7 disc herniation    open heart surgery  12-1994   asd repair    Gynecologic History:  Patient's last menstrual period was 05/01/2021 (exact date). Contraception: none Last Pap: Results were: 2021 no abnormalities  Last mammogram: 2022 Results were: BI-RAD I  Obstetric History: G2P0011  Family History:  Family History  Problem Relation Age of Onset   Cancer Mother 20       non hodgkins lymphoma(mets to brain)   Coronary artery disease Father  Hypertension Father    Hyperlipidemia Father    Diabetes Father    Diabetes Maternal Grandmother    Heart attack Maternal Grandfather    Other Maternal Grandfather        Deterioration of Jaw   Stroke Paternal Grandmother    Stroke Paternal Grandfather    Healthy Son     Social History:  Social History   Socioeconomic History   Marital status: Married    Spouse name: [name]   Number of children: 1   Years of education: Not on file   Highest education level: Not on file  Occupational History   Occupation: Designer, jewellery: [place]  Tobacco Use   Smoking status: Never   Smokeless tobacco: Never  Vaping Use   Vaping Use: Never used  Substance and Sexual Activity   Alcohol use: Not Currently   Drug use: No   Sexual activity: Yes    Partners: Male    Birth control/protection: None  Other Topics Concern   Not on file  Social History Narrative   Working 3 jobs currently   Social Determinants of Radio broadcast assistant Strain: Not on file  Food Insecurity: No Food Insecurity   Worried About Charity fundraiser in the Last Year: Never true   Arboriculturist in the Last Year: Never true  Transportation Needs: No Transportation Needs   Lack of Transportation (Medical): No   Lack of Transportation (Non-Medical): No  Physical Activity: Inactive   Days of Exercise per Week: 0 days   Minutes of Exercise per Session: 0 min  Stress: Not on file  Social Connections: Socially Isolated   Frequency of Communication with Friends and Family: Once a week   Frequency of Social Gatherings with Friends and Family: Once a week   Attends Religious Services: Never   Marine scientist or Organizations: No   Attends Music therapist: Never   Marital Status: Married  Human resources officer Violence: Not At Risk   Fear of Current or Ex-Partner: No   Emotionally Abused: No   Physically Abused: No   Sexually Abused: No    Allergies:  No Known Allergies  Medications: Prior to Admission medications   Medication Sig Start Date End Date Taking? Authorizing Provider  cariprazine (VRAYLAR) 1.5 MG capsule Take 1 capsule (1.5 mg total) by mouth daily. 03/25/21  Yes Sowles, Drue Stager, MD  Cyanocobalamin (B-12) 1000 MCG SUBL Place 1 tablet under the tongue daily. 12/21/18  Yes Sowles, Drue Stager, MD  Ginger, Zingiber officinalis, (GINGER ROOT PO) Take by mouth.   Yes [provider]  meloxicam (MOBIC) 15 MG tablet Take 1 tablet (15 mg total) by mouth daily. 03/25/21  Yes Sowles,  Drue Stager, MD  modafinil (PROVIGIL) 100 MG tablet TAKE 1 TABLET(100 MG) BY MOUTH DAILY 03/11/21  Yes Sowles, Drue Stager, MD  Omega-3 Fatty Acids (FISH OIL PO) Take by mouth daily.   Yes [provider]  pregabalin (LYRICA) 150 MG capsule Take 1 capsule (150 mg total) by mouth 2 (two) times daily. 03/25/21  Yes Sowles, Drue Stager, MD  Semaglutide (RYBELSUS) 7 MG TABS Take 7 mg by mouth daily. 03/25/21  Yes Sowles, Drue Stager, MD  solifenacin (VESICARE) 10 MG tablet Take 1 tablet (10 mg total) by mouth daily. 04/07/21  Yes MacDiarmid, Nicki Reaper, MD  TART CHERRY PO Take by mouth daily.   Yes [provider]  TURMERIC PO Take by mouth daily.   Yes  [provider]  Vitamin D, Ergocalciferol, (DRISDOL) 1.25 MG (50000 UNIT) CAPS capsule TAKE 1 CAPSULE BY MOUTH EVERY 7 DAYS 11/12/20  Yes Alba Cory, MD    Physical Exam Vitals: Blood pressure 126/84, height 5\' 3"  (1.6 m), weight 210 lb (95.3 kg), last menstrual period 05/01/2021.  General: NAD HEENT: normocephalic, anicteric Thyroid: no enlargement, no palpable nodules Pulmonary: No increased work of breathing, CTAB Cardiovascular: RRR, distal pulses 2+ Breast: Breast symmetrical, no tenderness, no palpable nodules or masses, no skin or nipple retraction present, no nipple discharge.  No axillary or supraclavicular lymphadenopathy. Abdomen: NABS, soft, non-tender, non-distended.  Umbilicus without lesions.  No hepatomegaly, splenomegaly or masses palpable. No evidence of hernia  Genitourinary:  External: Normal external female genitalia.  Normal urethral meatus, normal Bartholin's and Skene's glands.    Vagina: Normal vaginal mucosa, no evidence of prolapse.    Cervix: Grossly normal in appearance, no bleeding  Uterus: Non-enlarged, mobile, normal contour.  No CMT  Adnexa: ovaries non-enlarged, no adnexal masses  Rectal: deferred  Lymphatic: no evidence of inguinal lymphadenopathy Extremities: no edema, erythema, or  tenderness Neurologic: Grossly intact Psychiatric: mood appropriate, affect full  Female chaperone present for pelvic and breast  portions of the physical exam    Assessment: 40 y.o. G2P0011 routine annual exam  Plan: Problem List Items Addressed This Visit   None Visit Diagnoses     Cervical cancer screening    -  Primary   Relevant Orders   Cytology - PAP       1) Mammogram - recommend yearly screening mammogram.  Mammogram Is up to date   2) STI screening  was notoffered and therefore not obtained  3) ASCCP guidelines and rational discussed.  Patient opts for yearly screening interval for  two more years, and if paps are NILM, can then proceed to every 3-5 years.  4) Contraception - the patient is currently using  none.  She is happy with her current form of contraception and plans to continue  5) Colonoscopy -- Screening recommended starting at age 43 for average risk individuals, age 17 for individuals deemed at increased risk (including African Americans) and recommended to continue until age 44.  For patient age 79-85 individualized approach is recommended.  Gold standard screening is via colonoscopy, Cologuard screening is an acceptable alternative for patient unwilling or unable to undergo colonoscopy.  "Colorectal cancer screening for average?risk adults: 2018 guideline update from the American Cancer Society"CA: A Cancer Journal for Clinicians: Nov 11, 2016   6) Routine healthcare maintenance including cholesterol, diabetes screening discussed managed by PCP  7) Return in about 1 year (around 05/21/2022) for annual.   14/12/2021, CNM  05/21/2021 12:02 PM   [facility] 05/21/2021, 12:02 PM

## 2021-05-27 ENCOUNTER — Other Ambulatory Visit: Payer: Self-pay | Admitting: Family Medicine

## 2021-05-27 DIAGNOSIS — R635 Abnormal weight gain: Secondary | ICD-10-CM

## 2021-05-27 LAB — CYTOLOGY - PAP
Adequacy: ABSENT
Comment: NEGATIVE
High risk HPV: NEGATIVE

## 2021-05-27 MED ORDER — RYBELSUS 14 MG PO TABS: 14.0000 mg | ORAL_TABLET | Freq: Every day | ORAL | 0 refills | Status: DC

## 2021-05-27 NOTE — Telephone Encounter (Signed)
Requested medication (s) are due for refill today: yes  Requested medication (s) are on the active medication list: yes  Last refill:  04/29/21  Future visit scheduled: 10/01/21  Notes to clinic:  This med does not have a protocol to review, please assess.  Requested Prescriptions  Pending Prescriptions Disp Refills   RYBELSUS 7 MG TABS [Pharmacy Med Name: RYBELSUS 7MG  TABLETS] 30 tablet 1    Sig: TAKE 1 TABLET BY MOUTH DAILY     Off-Protocol Failed - 05/27/2021  3:36 AM      Failed - Medication not assigned to a protocol, review manually.      Passed - Valid encounter within last 12 months    Recent Outpatient Visits           2 months ago Need for immunization against influenza   [facility], MD   8 months ago Vitamin D deficiency   [facility], MD   11 months ago Mood disorder [facility])   [facility], MD   1 year ago Neck strain, initial encounter   [facility], MD   1 year ago Mood disorder [facility])   [facility], MD       Future Appointments             In 6 days MacDiarmid, Alba Cory, MD [facility]   In 4 months [facility], MD [facility]

## 2021-06-02 ENCOUNTER — Encounter: Payer: Self-pay | Admitting: Obstetrics

## 2021-06-02 ENCOUNTER — Other Ambulatory Visit: Payer: Self-pay

## 2021-06-02 ENCOUNTER — Ambulatory Visit (INDEPENDENT_AMBULATORY_CARE_PROVIDER_SITE_OTHER): Payer: BC Managed Care – PPO | Admitting: Urology

## 2021-06-02 VITALS — BP 140/82 | HR 87

## 2021-06-02 DIAGNOSIS — N3946 Mixed incontinence: Secondary | ICD-10-CM

## 2021-06-02 MED ORDER — SOLIFENACIN SUCCINATE 10 MG PO TABS: 10.0000 mg | ORAL_TABLET | Freq: Every day | ORAL | 3 refills | Status: DC

## 2021-06-02 NOTE — Progress Notes (Signed)
06/02/2021 9:17 AM   Deborah Navarro October 12, 1980 [id number]  Referring provider: Alba Cory, MD [address] [place],  [place] [address]  Chief Complaint  Patient presents with   Urinary Incontinence    HPI: I was consulted to assess the patient's frequency and mild urinary incontinence.  She voids every 1-2 hours.  She can have urge incontinence and stress incontinence.  Does not wear a pad.  Has had rare bedwetting.  Flow was good.     Grade 2 hypermobility the bladder neck and negative cough test.  Good spring back affect.  No prolapse   Patient has mild mixed incontinence. stress incontinence worse but both are mild    Reassess in 6 weeks on Myrbetriq 50 mg samples and prescription.  She did not want physical therapy.     Myrbetriq helped the nocturia but still has mild mixed incontinence.    Nighttime frequency improved on Vesicare but still has mild mixed incontinence.  Failed oxybutynin.  Clinically not infected.  She probably will stay on it for now and we will order urodynamics.   During urodynamics she did not void and was catheterized for 200 mL.  Maximum bladder capacity was 490 mL.  Bladder was stable.  She did not leak with a Valsalva pressure of 172 cm of water.  She could not void in the lab setting.  There was subtraction artifact I do not think the patient actually generated detrusor contraction.  EMG activity was within normal limits.  Bladder neck descent at 1 cm.  Spinal hardware noted the details of the urodynamics are signed and dictated    Patient has mild mixed incontinence.  She has very mild stress incontinence and does not shown any objective evidence.  In my opinion based upon the findings a sling would not be a good option for her.  If she does have bladder a contractility she may be also at higher risk of retention.  A bulking agent would be recommended.   Patient is nocturia much better and she is pleased.  She is voiding every 1-2  hours.  If she has a hard cough or sneeze she will leak a small amount.  She agrees no surgery for this.  Main symptom is the frequency and secondarily very mild stress incontinence but she is overall much happier because she is sleeping through the night better  Reassess in 8 weeks on Vesicare 10 mg.  Dosage increase.  She understands if I have samples I can give her the new beta 3 agonist on that visit.  I did not mention bulking agent.  We agreed not to do surgery  Today Continence dramatically better.  Frequency stable.  No infections.  Doing well on Vesicare 10 mg     PMH: Past Medical History:  Diagnosis Date   Anemia, unspecified    ASD (atrial septal defect)    repaired 1996   Blood transfusion, without reported diagnosis    Depressive disorder, not elsewhere classified    Elevated blood pressure reading without diagnosis of hypertension    Endometriosis of fallopian tube    Family history of adverse reaction to anesthesia    Father - PONV   Irritable bowel syndrome    PONV (postoperative nausea and vomiting)    Streptococcal meningitis    Undiagnosed cardiac murmurs    Unspecified personal history presenting hazards to health    Wears contact lenses     Surgical History: Past Surgical History:  Procedure  Laterality Date   COLONOSCOPY WITH PROPOFOL N/A 05/16/2018   Procedure: COLONOSCOPY WITH PROPOFOL;  Surgeon: Midge Minium, MD;  Location: [facility];  Service: Endoscopy;  Laterality: N/A;   DILATION AND CURETTAGE OF UTERUS     LAPAROSCOPIC ENDOMETRIOSIS FULGURATION  10-1999   endometriosis   NECK SURGERY N/A 06/14/15   due to C5/6 C6-7 disc herniation    open heart surgery  12-1994   asd repair    Home Medications:  Allergies as of 06/02/2021   No Known Allergies      Medication List        Accurate as of June 02, 2021  9:17 AM. If you have any questions, ask your nurse or doctor.          B-12 1000 MCG Subl Place 1 tablet under the  tongue daily.   cariprazine 1.5 MG capsule Commonly known as: Vraylar Take 1 capsule (1.5 mg total) by mouth daily.   FISH OIL PO Take by mouth daily.   GINGER ROOT PO Take by mouth.   meloxicam 15 MG tablet Commonly known as: MOBIC Take 1 tablet (15 mg total) by mouth daily.   modafinil 100 MG tablet Commonly known as: PROVIGIL TAKE 1 TABLET(100 MG) BY MOUTH DAILY   pregabalin 150 MG capsule Commonly known as: LYRICA Take 1 capsule (150 mg total) by mouth 2 (two) times daily.   Rybelsus 14 MG Tabs Generic drug: Semaglutide Take 14 mg by mouth daily.   solifenacin 10 MG tablet Commonly known as: VESICARE Take 1 tablet (10 mg total) by mouth daily.   TART CHERRY PO Take by mouth daily.   TURMERIC PO Take by mouth daily.   Vitamin D (Ergocalciferol) 1.25 MG (50000 UNIT) Caps capsule Commonly known as: DRISDOL TAKE 1 CAPSULE BY MOUTH EVERY 7 DAYS        Allergies: No Known Allergies  Family History: Family History  Problem Relation Age of Onset   Cancer Mother 61       non hodgkins lymphoma(mets to brain)   Coronary artery disease Father    Hypertension Father    Hyperlipidemia Father    Diabetes Father    Diabetes Maternal Grandmother    Heart attack Maternal Grandfather    Other Maternal Grandfather        Deterioration of Jaw   Stroke Paternal Grandmother    Stroke Paternal Grandfather    Healthy Son     Social History:  reports that she has never smoked. She has never used smokeless tobacco. She reports that she does not currently use alcohol. She reports that she does not use drugs.  ROS:                                        Physical Exam: LMP 05/07/2021 (Exact Date)   Constitutional:  Alert and oriented, No acute distress.   Laboratory Data: Lab Results  Component Value Date   WBC 6.4 09/25/2020   HGB 12.1 09/25/2020   HCT 37.7 09/25/2020   MCV 90.4 09/25/2020   PLT 286 09/25/2020    Lab Results   Component Value Date   CREATININE 0.94 09/25/2020    No results found for: PSA  No results found for: TESTOSTERONE  Lab Results  Component Value Date   HGBA1C 5.7 (H) 03/25/2021    Urinalysis    Component Value Date/Time   COLORURINE  YELLOW 09/07/2019 1055   APPEARANCEUR Cloudy (A) 10/28/2020 1309   LABSPEC 1.009 09/07/2019 1055   PHURINE 8.0 09/07/2019 1055   GLUCOSEU Negative 10/28/2020 1309   HGBUR NEGATIVE 09/07/2019 1055   BILIRUBINUR Negative 10/28/2020 1309   KETONESUR NEGATIVE 09/07/2019 1055   PROTEINUR Negative 10/28/2020 1309   PROTEINUR NEGATIVE 09/07/2019 1055   UROBILINOGEN 0.2 09/07/2019 1003   UROBILINOGEN 0.2 02/28/2014 2052   NITRITE Negative 10/28/2020 1309   NITRITE NEGATIVE 09/07/2019 1055   LEUKOCYTESUR Negative 10/28/2020 1309   LEUKOCYTESUR NEGATIVE 09/07/2019 1055    Pertinent Imaging:   Assessment & Plan: 90x3 sent to pharmacy and I will see in 1 year  There are no diagnoses linked to this encounter.  No follow-ups on file.  Martina Sinner, MD  [facility] [address] [place], [place] [address] [phone]

## 2021-06-04 NOTE — Progress Notes (Signed)
LGSIL pap. Note sent to pateint recommending she repeat the pap in one year . She is High Risk HPV.

## 2021-06-05 ENCOUNTER — Ambulatory Visit: Payer: Self-pay | Admitting: *Deleted

## 2021-06-05 NOTE — Telephone Encounter (Signed)
°  Chief Complaint: Hypersensitivity under right breast area Symptoms: Pain, tender to touch under right breast, radiates to back, red "Splotchy" Frequency: Onset 1 week ago, worsening Pertinent Negatives: Patient denies blistering,fever Disposition: [] ED /[] Urgent Care (no appt availability in office) / [x] Appointment(In office/virtual)/ []  [facility]/ [] Home Care/ [] Refused Recommended Disposition  Additional Notes: Virtual appt made first available for tomorrow. Care advise given, pt verbalizes understanding.                   Reason for Disposition  [1] Chest pain(s) lasting a few seconds AND [2] persists > 3 days  Answer Assessment - Initial Assessment Questions 1. LOCATION: "Where does it hurt?"       Right rib area, under breast 2. RADIATION: "Does the pain go anywhere else?" (e.g., into neck, jaw, arms, back)     Radiates to back 3. ONSET: "When did the chest pain begin?" (Minutes, hours or days)      LAst Thursday 4. PATTERN "Does the pain come and go, or has it been constant since it started?"  "Does it get worse with exertion?"      Worse with touch, Hypersensitivity. 5. DURATION: "How long does it last" (e.g., seconds, minutes, hours)     NA 6. SEVERITY: "How bad is the pain?"  (e.g., Scale 1-10; mild, moderate, or severe)    - MILD (1-3): doesn't interfere with normal activities     - MODERATE (4-7): interferes with normal activities or awakens from sleep    - SEVERE (8-10): excruciating pain, unable to do any normal activities       8/10 7. CARDIAC RISK FACTORS: "Do you have any history of heart problems or risk factors for heart disease?" (e.g., angina, prior heart attack; diabetes, high blood pressure, high cholesterol, smoker, or strong family history of heart disease)      8. PULMONARY RISK FACTORS: "Do you have any history of lung disease?"  (e.g., blood clots in lung, asthma, emphysema, birth control pills)      9. CAUSE: "What do you  think is causing the chest pain?"     Unsure 10. OTHER SYMPTOMS: "Do you have any other symptoms?" (e.g., dizziness, nausea, vomiting, sweating, fever, difficulty breathing, cough)      Mild nausea but new med.  Protocols used: Chest Pain-A-AH

## 2021-06-05 NOTE — Telephone Encounter (Signed)
Pt has an appt on 06/06/21 with Dr Caralee Ates

## 2021-06-06 ENCOUNTER — Telehealth (INDEPENDENT_AMBULATORY_CARE_PROVIDER_SITE_OTHER): Payer: BC Managed Care – PPO | Admitting: Internal Medicine

## 2021-06-06 DIAGNOSIS — B029 Zoster without complications: Secondary | ICD-10-CM | POA: Diagnosis not present

## 2021-06-06 MED ORDER — VALACYCLOVIR HCL 1 G PO TABS: 1000.0000 mg | ORAL_TABLET | Freq: Three times a day (TID) | ORAL | 0 refills | Status: AC

## 2021-06-06 NOTE — Progress Notes (Signed)
Virtual Visit via PHONE Note  I connected with Deborah Navarro on 06/06/21 at  8:00 AM EST OVER THE PHONE and verified that I am speaking with the correct person using two identifiers.  Location: Patient: Home  Provider: [facility]   I discussed the limitations of evaluation and management by telemedicine and the availability of in person appointments. The patient expressed understanding and agreed to proceed.  History of Present Illness:  Deborah Navarro is a 40 year old female presenting over telemedicine for pain under her breast. This started suddenly when she woke up yesterday morning under her right breast where her bra sits and radiates around the side of her right flank. She has never had anything like this before. She denies any rashes or skin changes. She does have red "blotchy" skin normally but she notes no changes. She describes the pain as a deep achy/burning type pain. It hurts even to lightly run a finger over the area and wearing a bra is very uncomfortable. She denies any other pain, no fevers or other systemic symptoms. She did have a little bit of diarrhea a few days ago but no other changes. She does have fibromyalgia and takes Lyrica and Mobic, both of which she states helps the pain a little bit.   Breast Pain: -Duration : 1 days -Location: under right breast under bra -Onset: sudden -Quality: dull, aching, and burning -Frequency: constant -Redness: no -Swelling: no -Trauma: no trauma -Breastfeeding: no -Associated with menstral cycle: no -Nipple discharge: no -Breast lump: no -Status: fluctuating -Treatments attempted: ibuprofen, Lyrica -Previous mammogram: yes, just had mammogram in November 2022, [facility]  Observations/Objective:  General: no acute distress Neuro: answers all questions appropriately   Assessment and Plan:  1. Herpes zoster without complication: As this visit was conducted over the phone, I was not able to do a physical exam but it does  sound like a herpes zoster infection before the rash breaks out. We will treat with an antiviral medication and we discussed etiology and pathology of shingles and keep the area covered and to be careful around immunocompromised people. She will keep me updated if a rash develops and follow up if symptoms worsen or fail to improve.   - valACYclovir (VALTREX) 1000 MG tablet; Take 1 tablet (1,000 mg total) by mouth 3 (three) times daily for 7 days.  Dispense: 21 tablet; Refill: 0  Follow Up Instructions: PRN    I discussed the assessment and treatment plan with the patient. The patient was provided an opportunity to ask questions and all were answered. The patient agreed with the plan and demonstrated an understanding of the instructions.   The patient was advised to call back or seek an in-person evaluation if the symptoms worsen or if the condition fails to improve as anticipated.  I provided 30 minutes of non-face-to-face time during this encounter.   Margarita Mail, DO

## 2021-06-06 NOTE — Patient Instructions (Signed)
It was great seeing you today!  Plan discussed at today's visit: -Treatment with antiviral medications for possible early shingles -Keep me updated if you develop a rash  Follow up in: if symptoms fail to improve or worsen  Take care and let us know if you have any questions or concerns prior to your next visit.  Dr. Caralee Ates   Shingles Shingles, which is also known as herpes zoster, is an infection that causes a painful skin rash and fluid-filled blisters. It is caused by a virus. Shingles only develops in people who: Have had chickenpox. Have been vaccinated against chickenpox. Shingles is rare in this group. What are the causes? Shingles is caused by varicella-zoster virus. This is the same virus that causes chickenpox. After a person is exposed to the virus, it stays in the body in an inactive (dormant) state. Shingles develops if the virus is reactivated. This can happen many years after the first (initial) exposure to the virus. It is not known what causes this virus to be reactivated. What increases the risk? People who have had chickenpox or received the chickenpox vaccine are at risk for shingles. Shingles infection is more common in people who: Are older than 42 years of age. Have a weakened disease-fighting system (immune system), such as people with: HIV (human immunodeficiency virus). AIDS (acquired immunodeficiency syndrome). Cancer. Are taking medicines that weaken the immune system, such as organ transplant medicines. Are experiencing a lot of stress. What are the signs or symptoms? Early symptoms of this condition include itching, tingling, and pain in an area on your skin. Pain may be described as burning, stabbing, or throbbing. A few days or weeks after early symptoms start, a painful red rash appears. The rash is usually on one side of the body and has a band-like or belt-like pattern. The rash eventually turns into fluid-filled blisters that break open, change into  scabs, and dry up in about 2-3 weeks. At any time during the infection, you may also develop: A fever. Chills. A headache. Nausea. How is this diagnosed? This condition is diagnosed with a skin exam. Skin or fluid samples (a culture) may be taken from the blisters before a diagnosis is made. How is this treated? The rash may last for several weeks. There is not a specific cure for this condition. Your health care provider may prescribe medicines to help you manage pain, recover more quickly, and avoid long-term problems. Medicines may include: Antiviral medicines. Anti-inflammatory medicines. Pain medicines. Anti-itching medicines (antihistamines). If the area involved is on your face, you may be referred to a specialist, such as an eye doctor (ophthalmologist) or an ear, nose, and throat (ENT) doctor (otorhinolaryngologist) to help you avoid eye problems, chronic pain, or disability. Follow these instructions at home: Medicines Take over-the-counter and prescription medicines only as told by your health care provider. Apply an anti-itch cream or numbing cream to the affected area as told by your health care provider. Relieving itching and discomfort  Apply cold, wet cloths (cold compresses) to the area of the rash or blisters as told by your health care provider. Cool baths can be soothing. Try adding baking soda or dry oatmeal to the water to reduce itching. Do not bathe in hot water. Use calamine lotion as recommended by your health care provider. This is an over-the-counter lotion that helps to relieve itchiness. Blister and rash care Keep your rash covered with a loose bandage (dressing). Wear loose-fitting clothing to help ease the pain of material rubbing  against the rash. Wash your hands with soap and water for at least 20 seconds before and after you change your dressing. If soap and water are not available, use hand sanitizer. Change your dressing as told by your health care  provider. Keep your rash and blisters clean by washing the area with mild soap and cool water as told by your health care provider. Check your rash every day for signs of infection. Check for: More redness, swelling, or pain. Fluid or blood. Warmth. Pus or a bad smell. Do not scratch your rash or pick at your blisters. To help avoid scratching: Keep your fingernails clean and cut short. Wear gloves or mittens while you sleep, if scratching is a problem. General instructions Rest as told by your health care provider. Wash your hands often with soap and water for at least 20 seconds. If soap and water are not available, use hand sanitizer. Doing this lowers your chance of getting a bacterial skin infection. Before your blisters change into scabs, your shingles infection can cause chickenpox in people who have never had it or have never been vaccinated against it. To prevent this from happening, avoid contact with other people, especially: Babies. Pregnant women. Children who have eczema. Older people who have transplants. People who have chronic illnesses, such as cancer or AIDS. Keep all follow-up visits. This is important. How is this prevented? Getting vaccinated is the best way to prevent shingles and protect against shingles complications. If you have not been vaccinated, talk with your health care provider about getting the vaccine. Where to find more information Centers for Disease Control and Prevention: [employer]br Contact a health care provider if: Your pain is not relieved with prescribed medicines. Your pain does not get better after the rash heals. You have any of these signs of infection: More redness, swelling, or pain around the rash. Fluid or blood coming from the rash. Warmth coming from your rash. Pus or a bad smell coming from the rash. A fever. Get help right away if: The rash is on your face or nose. You have facial pain, pain around your eye area, or loss of  feeling on one side of your face. You have difficulty seeing. You have ear pain or have ringing in your ear. You have a loss of taste. Your condition gets worse. Summary Shingles, also known as herpes zoster, is an infection that causes a painful skin rash and fluid-filled blisters. This condition is diagnosed with a skin exam. Skin or fluid samples (a culture) may be taken from the blisters. Keep your rash covered with a loose bandage (dressing). Wear loose-fitting clothing to help ease the pain of material rubbing against the rash. Before your blisters change into scabs, your shingles infection can cause chickenpox in people who have never had it or have never been vaccinated against it. This information is not intended to replace advice given to you by your health care provider. Make sure you discuss any questions you have with your health care provider. Document Revised: 05/27/2020 Document Reviewed: 05/27/2020 Elsevier Patient Education  2022 [employer].

## 2021-06-12 ENCOUNTER — Encounter: Payer: Self-pay | Admitting: Family Medicine

## 2021-06-12 ENCOUNTER — Other Ambulatory Visit: Payer: Self-pay

## 2021-06-12 ENCOUNTER — Ambulatory Visit (INDEPENDENT_AMBULATORY_CARE_PROVIDER_SITE_OTHER): Payer: BC Managed Care – PPO | Admitting: Internal Medicine

## 2021-06-12 ENCOUNTER — Encounter: Payer: Self-pay | Admitting: Internal Medicine

## 2021-06-12 VITALS — BP 124/76 | HR 84 | Temp 98.2°F | Resp 16 | Ht 64.0 in | Wt 205.8 lb

## 2021-06-12 DIAGNOSIS — M797 Fibromyalgia: Secondary | ICD-10-CM | POA: Diagnosis not present

## 2021-06-12 DIAGNOSIS — N644 Mastodynia: Secondary | ICD-10-CM | POA: Diagnosis not present

## 2021-06-12 NOTE — Patient Instructions (Addendum)
It was great seeing you today!  Plan discussed at today's visit: -Recommend trying Voltaren gel which is an anti-inflammatory gel in the localized area -Try heating pad and stretches, try to rest as best you can -Continue Mobic and Lyrica  Follow up in: as needed  Take care and let us know if you have any questions or concerns prior to your next visit.  Dr. Caralee Ates

## 2021-06-12 NOTE — Progress Notes (Signed)
Acute Office Visit  Subjective:    Patient ID: Deborah Navarro, female    DOB: 08/10/1980, 40 y.o.   MRN: [id number]  Chief Complaint  Patient presents with   Breast Pain    Right side on side and under breast    HPI Patient is in today for right breast pain. Very active at work with lifting and stretching, rest and not wearing a bra helps, swinging arm and rubbing the area hurts.   Breast Pain: -Duration : 2 weeks -Location: right under breast and at the top outer quadrant -Onset: sudden -Severity: moderate, worse with movement  -Quality: burning -Frequency: intermittent, not at rest  -Redness: no -Swelling: no -Trauma: no trauma -Breastfeeding: no -Associated with menstral cycle: no -Nipple discharge: no -Breast lump: no -Status: worse -Treatments attempted: antiviral medication, taking Mobic and Lyrica, hasn't helped -Previous mammogram: yes 11/30, [id number]   Past Medical History:  Diagnosis Date   Anemia, unspecified    ASD (atrial septal defect)    repaired 1996   Blood transfusion, without reported diagnosis    Depressive disorder, not elsewhere classified    Elevated blood pressure reading without diagnosis of hypertension    Endometriosis of fallopian tube    Family history of adverse reaction to anesthesia    Father - PONV   Irritable bowel syndrome    PONV (postoperative nausea and vomiting)    Streptococcal meningitis    Undiagnosed cardiac murmurs    Unspecified personal history presenting hazards to health    Wears contact lenses     Past Surgical History:  Procedure Laterality Date   COLONOSCOPY WITH PROPOFOL N/A 05/16/2018   Procedure: COLONOSCOPY WITH PROPOFOL;  Surgeon: Midge Minium, MD;  Location: [facility];  Service: Endoscopy;  Laterality: N/A;   DILATION AND CURETTAGE OF UTERUS     LAPAROSCOPIC ENDOMETRIOSIS FULGURATION  10-1999   endometriosis   NECK SURGERY N/A 06/14/15   due to C5/6 C6-7 disc herniation    open heart  surgery  12-1994   asd repair    Family History  Problem Relation Age of Onset   Cancer Mother 8       non hodgkins lymphoma(mets to brain)   Coronary artery disease Father    Hypertension Father    Hyperlipidemia Father    Diabetes Father    Diabetes Maternal Grandmother    Heart attack Maternal Grandfather    Other Maternal Grandfather        Deterioration of Jaw   Stroke Paternal Grandmother    Stroke Paternal Grandfather    Healthy Son     Social History   Socioeconomic History   Marital status: Married    Spouse name: Asher Muir   Number of children: 1   Years of education: Not on file   Highest education level: Not on file  Occupational History   Occupation: Aeronautical engineer    Employer: [employer]  Tobacco Use   Smoking status: Never   Smokeless tobacco: Never  Vaping Use   Vaping Use: Never used  Substance and Sexual Activity   Alcohol use: Not Currently   Drug use: No   Sexual activity: Yes    Partners: Male    Birth control/protection: None  Other Topics Concern   Not on file  Social History Narrative   Working 3 jobs currently   Social Determinants of Corporate investment banker Strain: Not on file  Food Insecurity: No Food Insecurity   Worried About  Running Out of Food in the Last Year: Never true   Ran Out of Food in the Last Year: Never true  Transportation Needs: No Transportation Needs   Lack of Transportation (Medical): No   Lack of Transportation (Non-Medical): No  Physical Activity: Inactive   Days of Exercise per Week: 0 days   Minutes of Exercise per Session: 0 min  Stress: Not on file  Social Connections: Socially Isolated   Frequency of Communication with Friends and Family: Once a week   Frequency of Social Gatherings with Friends and Family: Once a week   Attends Religious Services: Never   Database administrator or Organizations: No   Attends Engineer, structural: Never   Marital Status: Married  Careers information officer Violence: Not At Risk   Fear of Current or Ex-Partner: No   Emotionally Abused: No   Physically Abused: No   Sexually Abused: No    Outpatient Medications Prior to Visit  Medication Sig Dispense Refill   cariprazine (VRAYLAR) 1.5 MG capsule Take 1 capsule (1.5 mg total) by mouth daily. 90 capsule 1   Cyanocobalamin (B-12) 1000 MCG SUBL Place 1 tablet under the tongue daily. 30 tablet 0   Ginger, Zingiber officinalis, (GINGER ROOT PO) Take by mouth.     meloxicam (MOBIC) 15 MG tablet Take 1 tablet (15 mg total) by mouth daily. 90 tablet 1   modafinil (PROVIGIL) 100 MG tablet TAKE 1 TABLET(100 MG) BY MOUTH DAILY 90 tablet 0   Omega-3 Fatty Acids (FISH OIL PO) Take by mouth daily.     pregabalin (LYRICA) 150 MG capsule Take 1 capsule (150 mg total) by mouth 2 (two) times daily. 180 capsule 0   Semaglutide (RYBELSUS) 14 MG TABS Take 14 mg by mouth daily. 90 tablet 0   solifenacin (VESICARE) 10 MG tablet Take 1 tablet (10 mg total) by mouth daily. 90 tablet 3   TART CHERRY PO Take by mouth daily.     TURMERIC PO Take by mouth daily.     valACYclovir (VALTREX) 1000 MG tablet Take 1 tablet (1,000 mg total) by mouth 3 (three) times daily for 7 days. 21 tablet 0   Vitamin D, Ergocalciferol, (DRISDOL) 1.25 MG (50000 UNIT) CAPS capsule TAKE 1 CAPSULE BY MOUTH EVERY 7 DAYS 12 capsule 1   No facility-administered medications prior to visit.    No Known Allergies  Review of Systems  Constitutional:  Negative for chills, fever and unexpected weight change.  Gastrointestinal:  Negative for abdominal pain and blood in stool.  Genitourinary:  Negative for hematuria.  Musculoskeletal:  Negative for back pain, joint swelling and neck pain.  Skin: Negative.   Hematological:  Negative for adenopathy.      Objective:    Physical Exam Constitutional:      Appearance: Normal appearance.  HENT:     Head: Normocephalic and atraumatic.  Eyes:     Conjunctiva/sclera: Conjunctivae normal.   Cardiovascular:     Rate and Rhythm: Normal rate and regular rhythm.  Pulmonary:     Effort: Pulmonary effort is normal.     Breath sounds: Normal breath sounds.  Chest:  Breasts:    Right: Normal. No swelling, bleeding, inverted nipple, mass, nipple discharge, skin change or tenderness.     Left: Normal. No swelling, bleeding, inverted nipple, mass, nipple discharge, skin change or tenderness.  Lymphadenopathy:     Upper Body:     Right upper body: No supraclavicular, axillary or pectoral adenopathy.  Left upper body: No supraclavicular, axillary or pectoral adenopathy.  Skin:    General: Skin is warm and dry.  Neurological:     General: No focal deficit present.     Mental Status: She is alert. Mental status is at baseline.  Psychiatric:        Mood and Affect: Mood normal.        Behavior: Behavior normal.    BP 124/76    Pulse 84    Temp 98.2 F (36.8 C)    Resp 16    Ht 5\' 4"  (1.626 m)    Wt 205 lb 12.8 oz (93.4 kg)    SpO2 99%    BMI 35.33 kg/m  Wt Readings from Last 3 Encounters:  06/12/21 205 lb 12.8 oz (93.4 kg)  05/21/21 210 lb (95.3 kg)  04/07/21 215 lb (97.5 kg)    Health Maintenance Due  Topic Date Due   COVID-19 Vaccine (1) Never done   Pneumococcal Vaccine 92-25 Years old (1 - PCV) Never done    There are no preventive care reminders to display for this patient.   Lab Results  Component Value Date   TSH 1.35 08/15/2019   Lab Results  Component Value Date   WBC 6.4 09/25/2020   HGB 12.1 09/25/2020   HCT 37.7 09/25/2020   MCV 90.4 09/25/2020   PLT 286 09/25/2020   Lab Results  Component Value Date   NA 139 09/25/2020   K 4.0 09/25/2020   CO2 29 09/25/2020   GLUCOSE 87 09/25/2020   BUN 18 09/25/2020   CREATININE 0.94 09/25/2020   BILITOT 0.3 09/25/2020   ALKPHOS 59 03/23/2019   AST 18 09/25/2020   ALT 22 09/25/2020   PROT 7.2 09/25/2020   ALBUMIN 4.1 03/23/2019   CALCIUM 9.4 09/25/2020   ANIONGAP 7 03/23/2019   GFR 77.08 12/23/2017    Lab Results  Component Value Date   CHOL 216 (H) 03/25/2021   Lab Results  Component Value Date   HDL 50 03/25/2021   Lab Results  Component Value Date   LDLCALC 144 (H) 03/25/2021   Lab Results  Component Value Date   TRIG 108 03/25/2021   Lab Results  Component Value Date   CHOLHDL 4.3 03/25/2021   Lab Results  Component Value Date   HGBA1C 5.7 (H) 03/25/2021       Assessment & Plan:   1. Breast pain, right/Fibromyalgia syndrome: Breast exam normal, no skin changes or masses/lumps, no lymphadenopathy. Reviewed mammogram from November, normal. At this point pain appears to be inflammatory or a flare of fibromyalgia. Recommend trying an anti-inflammatory gel like Voltaren as the pain is localized. We tried anti-virals thinking it could be a herpes zoster prior to rash but that didn't change the pain and no rash ever started. Reviewed stretching and exercise that can help with fibromyalgia pain. Reviewed risks of taking daily Mobic, continue Lyrica.    Teodora Medici, DO

## 2021-06-30 ENCOUNTER — Other Ambulatory Visit: Payer: Self-pay | Admitting: Family Medicine

## 2021-07-21 ENCOUNTER — Other Ambulatory Visit: Payer: Self-pay | Admitting: Family Medicine

## 2021-07-21 DIAGNOSIS — F39 Unspecified mood [affective] disorder: Secondary | ICD-10-CM

## 2021-07-21 DIAGNOSIS — M792 Neuralgia and neuritis, unspecified: Secondary | ICD-10-CM

## 2021-07-21 DIAGNOSIS — M797 Fibromyalgia: Secondary | ICD-10-CM

## 2021-08-11 ENCOUNTER — Encounter: Payer: Self-pay | Admitting: Family Medicine

## 2021-08-11 NOTE — Progress Notes (Signed)
Name: Deborah Navarro   MRN: [id number]    DOB: Oct 19, 1980   Date:08/12/2021       Progress Note  Subjective  Chief Complaint  Swelling- Under L Breast  HPI  Upper abdominal pain: she was seen by Dr. Caralee Ates Dec 2023, she states pain originally on right side and radiating to axillary area, initially dull and burning , the pain on right side improved , mostly a discomfort to touch, however over the past week pain is on left upper quadrant/over lower ribs, some swelling in the area, pain is sharp and more intense. Not associated with meals, she has nausea but stable since started on Rybelsus. No rashes, only aggravated by touch or movement . No change in bowel movements.  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 No change in activity. She does picks up up to 50 lbs boxes max, from the floor, from palates. Reaching and lifting from different direction. No trauma She states she rotates her bras, she has lost weight . She has FMS and is doing her stretches and taking Lyrica.    Patient Active Problem List   Diagnosis Date Noted   Cervical disc disease 03/07/2020   Irritable bowel syndrome (IBS) 09/26/2019   Mood disorder (HCC) 08/05/2018   Papanicolaou smear of cervix with positive high risk human papilloma virus (HPV) test 05/04/2018   False positive ana 04/28/2018   Pain in both feet 04/28/2018   Facial rash 04/07/2018   Chronic fatigue 04/07/2018   Chronic neck pain 03/27/2016   Myalgia 03/27/2016   Left-sided headache 03/27/2016   Family history of early CAD 03/15/2015   History of colon polyps 06/30/2010   History of anemia 06/30/2010   Depression, major, in remission (HCC) 06/30/2010   Endometriosis 06/30/2010   CARDIAC MURMUR 06/30/2010   History of chicken pox 06/30/2010    Past Surgical History:  Procedure Laterality Date   COLONOSCOPY WITH PROPOFOL N/A 05/16/2018   Procedure: COLONOSCOPY WITH PROPOFOL;  Surgeon: Midge Minium, MD;   Location: [facility];  Service: Endoscopy;  Laterality: N/A;   DILATION AND CURETTAGE OF UTERUS     LAPAROSCOPIC ENDOMETRIOSIS FULGURATION  10-1999   endometriosis   NECK SURGERY N/A 06/14/15   due to C5/6 C6-7 disc herniation    open heart surgery  12-1994   asd repair    Family History  Problem Relation Age of Onset   Cancer Mother 40       non hodgkins lymphoma(mets to brain)   Coronary artery disease Father    Hypertension Father    Hyperlipidemia Father    Diabetes Father    Diabetes Maternal Grandmother    Heart attack Maternal Grandfather    Other Maternal Grandfather        Deterioration of Jaw   Stroke Paternal Grandmother    Stroke Paternal Grandfather    Healthy Son     Social History   Tobacco Use   Smoking status: Never   Smokeless tobacco: Never  Substance Use Topics   Alcohol use: Not Currently     Current Outpatient Medications:    cariprazine (VRAYLAR) 1.5 MG capsule, Take 1 capsule (1.5 mg total) by mouth daily., Disp: 90 capsule, Rfl: 1   Cyanocobalamin (B-12) 1000 MCG SUBL, Place 1 tablet under the tongue daily., Disp: 30 tablet, Rfl: 0   Ginger, Zingiber officinalis, (GINGER ROOT PO), Take by mouth., Disp: , Rfl:    meloxicam (MOBIC) 15 MG tablet,  Take 1 tablet (15 mg total) by mouth daily., Disp: 90 tablet, Rfl: 1   modafinil (PROVIGIL) 100 MG tablet, TAKE 1 TABLET(100 MG) BY MOUTH DAILY, Disp: 90 tablet, Rfl: 0   Omega-3 Fatty Acids (FISH OIL PO), Take by mouth daily., Disp: , Rfl:    pregabalin (LYRICA) 150 MG capsule, Take 1 capsule (150 mg total) by mouth 2 (two) times daily., Disp: 180 capsule, Rfl: 0   Semaglutide (RYBELSUS) 14 MG TABS, Take 14 mg by mouth daily., Disp: 90 tablet, Rfl: 0   solifenacin (VESICARE) 10 MG tablet, Take 1 tablet (10 mg total) by mouth daily., Disp: 90 tablet, Rfl: 3   TART CHERRY PO, Take by mouth daily., Disp: , Rfl:    TURMERIC PO, Take by mouth daily., Disp: , Rfl:    Vitamin D, Ergocalciferol,  (DRISDOL) 1.25 MG (50000 UNIT) CAPS capsule, TAKE 1 CAPSULE BY MOUTH EVERY 7 DAYS, Disp: 12 capsule, Rfl: 1  No Known Allergies  I personally reviewed active problem list, medication list, allergies, family history, social history, health maintenance with the patient/caregiver today.   ROS  Ten systems reviewed and is negative except as mentioned in HPI - weight loss , mild nausea intermittently due to Rybelsus   Objective  Vitals:   08/12/21 0945  BP: 128/80  Pulse: 88  Resp: 16  Temp: 97.7 F (36.5 C)  TempSrc: Oral  SpO2: 99%  Weight: 196 lb 1.6 oz (89 kg)  Height: 5\' 4"  (1.626 m)    Body mass index is 33.66 kg/m.  Physical Exam  Constitutional: Patient appears well-developed and well-nourished. Obese  No distress.  HEENT: head atraumatic, normocephalic, pupils equal and reactive to light,, neck supple, throat within normal limits Cardiovascular: Normal rate, regular rhythm and normal heart sounds.  No murmur heard. No BLE edema. Pulmonary/Chest: Effort normal and breath sounds normal. No respiratory distress. Abdominal: Soft.  There is tenderness when palpating the lower ribs on left side, with some subcutaneous nodules.  Psychiatric: Patient has a normal mood and affect. behavior is normal. Judgment and thought content normal.   Recent Results (from the past 2160 hour(s))  Cytology - PAP     Status: Abnormal   Collection Time: 05/21/21  9:08 AM  Result Value Ref Range   High risk HPV Negative    Adequacy      Satisfactory for evaluation; transformation zone component ABSENT.   Diagnosis - Low grade squamous intraepithelial lesion (LSIL) (A)    Microorganisms Shift in flora suggestive of bacterial vaginosis    Comment Normal Reference Range HPV - Negative     PHQ2/9: Depression screen [facility] 2/9 08/12/2021 06/12/2021 06/06/2021 03/25/2021 09/25/2020  Decreased Interest 0 0 0 1 1  Down, Depressed, Hopeless 0 0 0 0 0  PHQ - 2 Score 0 0 0 1 1  Altered sleeping 0 0 0  3 3  Tired, decreased energy 0 0 0 3 3  Change in appetite 0 0 0 0 3  Feeling bad or failure about yourself  0 0 0 0 0  Trouble concentrating 0 0 0 0 0  Moving slowly or fidgety/restless 0 0 0 0 0  Suicidal thoughts 0 0 0 0 0  PHQ-9 Score 0 0 0 7 10  Difficult doing work/chores Not difficult at all Not difficult at all Not difficult at all - -  Some recent data might be hidden    phq 9 is negative   Fall Risk: Fall Risk  08/12/2021 06/12/2021 06/06/2021  03/25/2021 09/25/2020  Falls in the past year? 0 0 0 0 0  Number falls in past yr: 0 0 0 0 0  Injury with Fall? 0 0 0 0 0  Risk for fall due to : No Fall Risks - No Fall Risks No Fall Risks -  Follow up Falls prevention discussed - Falls prevention discussed Falls prevention discussed -      Functional Status Survey: Is the patient deaf or have difficulty hearing?: No Does the patient have difficulty seeing, even when wearing glasses/contacts?: No Does the patient have difficulty concentrating, remembering, or making decisions?: No Does the patient have difficulty walking or climbing stairs?: No Does the patient have difficulty dressing or bathing?: No Does the patient have difficulty doing errands alone such as visiting a doctor's office or shopping?: No    Assessment & Plan  1. Subcutaneous nodule of chest wall  - [place] CHEST SOFT TISSUE; Future

## 2021-08-12 ENCOUNTER — Other Ambulatory Visit: Payer: Self-pay

## 2021-08-12 ENCOUNTER — Ambulatory Visit (INDEPENDENT_AMBULATORY_CARE_PROVIDER_SITE_OTHER): Payer: BC Managed Care – PPO | Admitting: Family Medicine

## 2021-08-12 ENCOUNTER — Encounter: Payer: Self-pay | Admitting: Family Medicine

## 2021-08-12 VITALS — BP 128/80 | HR 88 | Temp 97.7°F | Resp 16 | Ht 64.0 in | Wt 196.1 lb

## 2021-08-12 DIAGNOSIS — R222 Localized swelling, mass and lump, trunk: Secondary | ICD-10-CM

## 2021-08-20 ENCOUNTER — Other Ambulatory Visit: Payer: Self-pay

## 2021-08-20 ENCOUNTER — Ambulatory Visit
Admission: RE | Admit: 2021-08-20 | Discharge: 2021-08-20 | Disposition: A | Discharge status: Home / Self Care | Payer: BC Managed Care – PPO | Source: Ambulatory Visit | Attending: Family Medicine | Admitting: Family Medicine

## 2021-08-20 DIAGNOSIS — R222 Localized swelling, mass and lump, trunk: Secondary | ICD-10-CM | POA: Insufficient documentation

## 2021-08-20 DIAGNOSIS — R918 Other nonspecific abnormal finding of lung field: Secondary | ICD-10-CM | POA: Diagnosis not present

## 2021-08-20 DIAGNOSIS — R079 Chest pain, unspecified: Secondary | ICD-10-CM | POA: Diagnosis not present

## 2021-08-30 ENCOUNTER — Other Ambulatory Visit: Payer: Self-pay | Admitting: Family Medicine

## 2021-09-30 NOTE — Progress Notes (Signed)
Name: Deborah Navarro   MRN: [id number]    DOB: 1981-02-03   Date:10/01/2021 ? ?     Progress Note ? ?Subjective ? ?Chief Complaint ? ?Follow Up ? ?HPI ? ?Fatigue: B12 is back to normal but she continues to feel very tired. Explained likely multifactorial. She has also added biotin due to hair loss since COVID-19 ( symptoms started 05/2020). She is taking Modafinil helps with fatigue but still has to nap during the day occasionally ? ?Feet Pain: she states pain has been going since 2021 she states started with pain on forefoot, goes from toes to ball of her feet, currently worse on left foot. She states burning like, sometimes aching, sometimes it gets red at the end of the day, she also has intermittent swelling that is worse in the morning. She has been taking Meloxicam and the fact that she changed positions at work she is not walking as much and has improved. She states trying to walk at least 7500 steps per day.  ? ?Impingement syndrome right shoulder: started about one month ago , she states pain with touch, sleeping on right side or with abduction of right shoulder with elbow flexed . She works lifting heavy boxes at work. She is trying to take classes at [employer] to be able to have a less physical job ?  ?FMS: she was taking Lyrica  200 mg TID, she was gaining a lot of weight and is down to 150 mg BID, she also takes Meloxicam daily , pain level is on average 6/10  She has history of elevated ANA , sed rate and CRP and sed rate. She was seen by Dr. Al Pimple and had more labs done, she has OA hands and positive ANA and at this time to try tumeric, ginger root daily. She continues to have discomfort under her breast but now also on inner thighs , periumbilical and also neck area - she has been seen by neurologist in 2017 and had MRI c-spine and brain that did not show MS, explained it may be secondary to decrease in Lyrica dose since symptoms started when the dose was dropped  ?  ?Mood Disorder: .Insurance  denied Wellsite geologist initially , she has tried Abilify, Elavil  and Duloxetine but did not work well for her. We did a PA and she has been on Vraylar 1.5 mg since 2021. 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 She states that Modafinil helps with energy and motivation  ? ?History of c-spine surgery: Dec 2016, she has dull aches on neck but denies radiculitis.  ? ?IBS: long history of periods of constipation with intermittent diarrhea that is associated with bloating, and abdominal pain, no blood in stools She has bowel movements at least a couple of times a week lately, mostly constipation lately , she states Linzess worked but caused diarrhea, we will try Trulance, voucher given to the patient today  ? ?Urge incontinence: under the care of Dr. Sherron Monday and had a uro-dynamic study this morning. Currently taking Vesicare and it helps with nocturia ( down to one time per night), nocturnal enuresis resolved, but still has urinary frequency and stress incontinence symptoms during the day. Unchanged   ? ?Morbid obesity: BMI above 35  with co-morbidities, she has OA and pain on feet, neuropathy, dyslipidemia, we started her on Rybelsus Fall 2022 and she has lost 25 lbs on medication. She would like to go down on Rybelsus from 14 mg to 7 mg since higher dose is causing a lot of nausea  ? ?Pre-diabetes: she has insulin resistance, A1C of 5.7 % back in 10/22, she is taking Rybelsus and has early satiety and on higher dose she is feeling more nauseated, we will go down to 7 mg daily  ? ?Patient Active Problem List  ? Diagnosis Date Noted  ? Cervical disc disease 03/07/2020  ? Irritable bowel syndrome (IBS) 09/26/2019  ? Mood disorder (HCC) 08/05/2018  ? Papanicolaou smear of cervix  with positive high risk human papilloma virus (HPV) test 05/04/2018  ? False positive ana 04/28/2018  ? Pain in both feet 04/28/2018  ? Facial rash 04/07/2018  ? Chronic fatigue 04/07/2018  ? Chronic neck pain 03/27/2016  ? Myalgia 03/27/2016  ? Left-sided headache 03/27/2016  ? Family history of early CAD 03/15/2015  ? History of colon polyps 06/30/2010  ? History of anemia 06/30/2010  ? Depression, major, in remission (HCC) 06/30/2010  ? Endometriosis 06/30/2010  ? CARDIAC MURMUR 06/30/2010  ? History of chicken pox 06/30/2010  ? ? ?Past Surgical History:  ?Procedure Laterality Date  ? COLONOSCOPY WITH PROPOFOL N/A 05/16/2018  ? Procedure: COLONOSCOPY WITH PROPOFOL;  Surgeon: Midge Minium, MD;  Location: [facility];  Service: Endoscopy;  Laterality: N/A;  ? DILATION AND CURETTAGE OF UTERUS    ? LAPAROSCOPIC ENDOMETRIOSIS FULGURATION  10-1999  ? endometriosis  ? NECK SURGERY N/A 06/14/15  ? due to C5/6 C6-7 disc herniation   ? open heart surgery  12-1994  ? asd repair  ? ? ?Family History  ?Problem Relation Age of Onset  ? Cancer Mother 14  ?     non hodgkins lymphoma(mets to brain)  ? Coronary artery disease Father   ? Hypertension Father   ? Hyperlipidemia Father   ? Diabetes Father   ? Diabetes Maternal Grandmother   ? Heart attack Maternal Grandfather   ? Other Maternal Grandfather   ?     Deterioration of Jaw  ? Stroke Paternal Grandmother   ? Stroke Paternal Grandfather   ? Healthy Son   ? ? ?Social History  ? ?Tobacco Use  ? Smoking status: Never  ? Smokeless tobacco: Never  ?Substance Use Topics  ? Alcohol use: Not Currently  ? ? ? ?Current Outpatient Medications:  ?  cariprazine (VRAYLAR) 1.5 MG capsule, Take 1 capsule (1.5 mg total) by mouth daily., Disp: 90 capsule, Rfl: 1 ?  Cyanocobalamin (B-12) 1000 MCG SUBL, Place 1 tablet under the tongue daily., Disp: 30 tablet, Rfl: 0 ?  Ginger, Zingiber officinalis, (GINGER ROOT PO), Take by mouth., Disp: , Rfl:  ?  meloxicam (MOBIC) 15 MG tablet, Take 1  tablet (15 mg total) by mouth daily., Disp: 90 tablet, Rfl: 1 ?  modafinil (PROVIGIL) 100 MG tablet, TAKE 1 TABLET(100 MG) BY MOUTH DAILY, Disp: 90 tablet, Rfl: 0 ?  Omega-3 Fatty Acids (FISH OIL PO), Take by mouth daily., Disp: , Rfl:  ?  pregabalin (LYRICA) 150 MG capsule, Take 1 capsule (150 mg total) by mouth 2 (two) times daily., Disp: 180 capsule, Rfl: 0 ?  RYBELSUS 14 MG TABS, TAKE 1 TABLET BY MOUTH DAILY, Disp: 90 tablet, Rfl: 0 ?  solifenacin (VESICARE) 10 MG tablet, Take 1 tablet (10 mg total) by  mouth daily., Disp: 90 tablet, Rfl: 3 ?  TART CHERRY PO, Take by mouth daily., Disp: , Rfl:  ?  TURMERIC PO, Take by mouth daily., Disp: , Rfl:  ?  Vitamin D, Ergocalciferol, (DRISDOL) 1.25 MG (50000 UNIT) CAPS capsule, TAKE 1 CAPSULE BY MOUTH EVERY 7 DAYS, Disp: 12 capsule, Rfl: 1 ? ?No Known Allergies ? ?I personally reviewed active problem list, medication list, allergies, family history, social history, health maintenance with the patient/caregiver today. ? ? ?ROS ? ?Constitutional: Negative for fever, positive for  weight change.  ?Respiratory: Negative for cough and shortness of breath.   ?Cardiovascular: Negative for chest pain or palpitations.  ?Gastrointestinal: Negative for abdominal pain, no bowel changes.  ?Musculoskeletal: Negative for gait problem or joint swelling.  ?Skin: Negative for rash.  ?Neurological: Negative for dizziness or headache.  ?No other specific complaints in a complete review of systems (except as listed in HPI above).  ? ?Objective ? ?Vitals:  ? 10/01/21 0803  ?BP: 126/74  ?Pulse: 93  ?Resp: 16  ?SpO2: 99%  ?Weight: 190 lb (86.2 kg)  ?Height: 5\' 3"  (1.6 m)  ? ? ?Body mass index is 33.66 kg/m?. ? ?Physical Exam ? ?Constitutional: Patient appears well-developed and well-nourished. Obese  No distress.  ?HEENT: head atraumatic, normocephalic, pupils equal and reactive to light, neck supple, throat within normal limits ?Cardiovascular: Normal rate, regular rhythm and normal heart  sounds.  No murmur heard. No BLE edema. ?Pulmonary/Chest: Effort normal and breath sounds normal. No respiratory distress. ?Abdominal: Soft.  There is no tenderness. ?Muscular Skeletal: trigger point positive  ?Ps

## 2021-10-01 ENCOUNTER — Ambulatory Visit (INDEPENDENT_AMBULATORY_CARE_PROVIDER_SITE_OTHER): Payer: BC Managed Care – PPO | Admitting: Family Medicine

## 2021-10-01 ENCOUNTER — Encounter: Payer: Self-pay | Admitting: Family Medicine

## 2021-10-01 VITALS — BP 126/74 | HR 93 | Resp 16 | Ht 63.0 in | Wt 190.0 lb

## 2021-10-01 DIAGNOSIS — E538 Deficiency of other specified B group vitamins: Secondary | ICD-10-CM

## 2021-10-01 DIAGNOSIS — E785 Hyperlipidemia, unspecified: Secondary | ICD-10-CM

## 2021-10-01 DIAGNOSIS — M797 Fibromyalgia: Secondary | ICD-10-CM

## 2021-10-01 DIAGNOSIS — F39 Unspecified mood [affective] disorder: Secondary | ICD-10-CM

## 2021-10-01 DIAGNOSIS — M79671 Pain in right foot: Secondary | ICD-10-CM

## 2021-10-01 DIAGNOSIS — E559 Vitamin D deficiency, unspecified: Secondary | ICD-10-CM | POA: Diagnosis not present

## 2021-10-01 DIAGNOSIS — M79672 Pain in left foot: Secondary | ICD-10-CM

## 2021-10-01 DIAGNOSIS — M792 Neuralgia and neuritis, unspecified: Secondary | ICD-10-CM

## 2021-10-01 DIAGNOSIS — K582 Mixed irritable bowel syndrome: Secondary | ICD-10-CM

## 2021-10-01 MED ORDER — RYBELSUS 7 MG PO TABS: 7.0000 mg | ORAL_TABLET | Freq: Every day | ORAL | 0 refills | Status: DC

## 2021-10-01 MED ORDER — VITAMIN D (ERGOCALCIFEROL) 1.25 MG (50000 UNIT) PO CAPS: 50000.0000 [IU] | ORAL_CAPSULE | ORAL | 1 refills | Status: DC

## 2021-10-01 MED ORDER — MODAFINIL 100 MG PO TABS: ORAL_TABLET | ORAL | 0 refills | Status: DC

## 2021-10-01 MED ORDER — MELOXICAM 15 MG PO TABS: 15.0000 mg | ORAL_TABLET | Freq: Every day | ORAL | 1 refills | Status: DC

## 2021-10-01 MED ORDER — CARIPRAZINE HCL 1.5 MG PO CAPS: 1.5000 mg | ORAL_CAPSULE | Freq: Every day | ORAL | 1 refills | Status: DC

## 2021-10-01 MED ORDER — TRULANCE 3 MG PO TABS: 1.0000 | ORAL_TABLET | Freq: Every day | ORAL | 1 refills | Status: DC

## 2021-10-21 ENCOUNTER — Other Ambulatory Visit: Payer: Self-pay | Admitting: Family Medicine

## 2021-10-21 DIAGNOSIS — M797 Fibromyalgia: Secondary | ICD-10-CM

## 2021-10-21 DIAGNOSIS — F39 Unspecified mood [affective] disorder: Secondary | ICD-10-CM

## 2021-10-21 DIAGNOSIS — M792 Neuralgia and neuritis, unspecified: Secondary | ICD-10-CM

## 2021-10-27 ENCOUNTER — Encounter: Payer: Self-pay | Admitting: Family Medicine

## 2021-11-04 DIAGNOSIS — M7591 Shoulder lesion, unspecified, right shoulder: Secondary | ICD-10-CM | POA: Diagnosis not present

## 2021-12-04 DIAGNOSIS — M67813 Other specified disorders of tendon, right shoulder: Secondary | ICD-10-CM | POA: Diagnosis not present

## 2021-12-30 NOTE — Progress Notes (Unsigned)
Name: Deborah Navarro   MRN: [id number]    DOB: Nov 16, 1980   Date:12/31/2021       Progress Note  Subjective  Chief Complaint  Follow Up  HPI  Feet Pain: she states pain has been going since 2021 she states started with pain on forefoot, goes from toes to ball of her feet, currently worse on left foot. She states burning like, sometimes aching, sometimes it gets red at the end of the day, she also has intermittent swelling that is worse in the morning. She has been taking Meloxicam and the fact that she changed positions at work she is not walking as much and has improved. Stable    FMS: she was taking Lyrica  200 mg TID, she was gaining a lot of weight and is down to 150 mg BID, she also takes Meloxicam daily , pain level is on average 6/10  She has history of elevated ANA , sed rate and CRP and sed rate. She was seen by Dr. Al Pimple and had more labs done, she has OA hands and positive ANA and at this time to try tumeric, ginger root daily. She continues to have discomfort under her breast but now also on inner thighs , periumbilical and also neck area - she has been seen by neurologist in 2017 and had MRI c-spine and brain that did not show M. Unchanged    Mood Disorder: .Insurance denied [employer] initially , she has tried Abilify, Elavil  and Duloxetine but did not work well for her. We did a PA and she has been on Vraylar 1.5 mg since 2021 and also modafinil for daytime fatigue and seems to be working for mood, but still very tired and not sleeping well. She is snoring and ESS is elevated and we will check a sleep study   History of c-spine surgery: Dec 2016, she has dull aches on neck but denies radiculitis. Unchanged   IBS:she tried trulance but caused diarrhea and stopped medication, bowel movements are better since stopped Vesicare   Urge incontinence: under the care of Dr. Sherron Monday and had a uro-dynamic study this morning. She was on Vesicar and initially felt like it was helping  with nocturia but since it stopped working and it was causing constipation , she states constipation resolved after she stopped Vesicare   Obesity BMI above 35 with co-morbidities, she has OA and pain on feet, neuropathy, dyslipidemia, we started her on Rybelsus Fall 2022 and she had lost 31 lbs since started medication October 2022 , only able to tolerate 7 mg dose , still losing weight.   Pre-diabetes: she has insulin resistance, A1C of 5.7 % back in 10/22, she is taking Rybelsus and has early satiety and on higher dose she is feeling more nauseated, she is back on 7 mg and doing well    Patient Active Problem List   Diagnosis Date Noted   Morbid obesity (HCC) 10/01/2021   Cervical disc disease 03/07/2020   Irritable bowel syndrome (IBS) 09/26/2019   Mood disorder (HCC) 08/05/2018   Papanicolaou smear of cervix with positive high risk human papilloma virus (HPV) test 05/04/2018   False positive ana 04/28/2018   Pain in both feet 04/28/2018   Facial rash 04/07/2018   Chronic fatigue 04/07/2018   Chronic neck pain 03/27/2016   Myalgia 03/27/2016   Left-sided headache 03/27/2016   Family history of early CAD 03/15/2015   History of colon polyps 06/30/2010   History of anemia 06/30/2010   Depression,  major, in remission (HCC) 06/30/2010   Endometriosis 06/30/2010   CARDIAC MURMUR 06/30/2010   History of chicken pox 06/30/2010    Past Surgical History:  Procedure Laterality Date   COLONOSCOPY WITH PROPOFOL N/A 05/16/2018   Procedure: COLONOSCOPY WITH PROPOFOL;  Surgeon: Midge Minium, MD;  [facility];  Service: Endoscopy;  Laterality: N/A;   DILATION AND CURETTAGE OF UTERUS     LAPAROSCOPIC ENDOMETRIOSIS FULGURATION  10-1999   endometriosis   NECK SURGERY N/A 06/14/15   due to C5/6 C6-7 disc herniation    open heart surgery  12-1994   asd repair    Family History  Problem Relation Age of Onset   Cancer Mother 63       non hodgkins lymphoma(mets to brain)    Coronary artery disease Father    Hypertension Father    Hyperlipidemia Father    Diabetes Father    Diabetes Maternal Grandmother    Heart attack Maternal Grandfather    Other Maternal Grandfather        Deterioration of Jaw   Stroke Paternal Grandmother    Stroke Paternal Grandfather    Healthy Son     Social History   Tobacco Use   Smoking status: Never   Smokeless tobacco: Never  Substance Use Topics   Alcohol use: Not Currently     Current Outpatient Medications:    cariprazine (VRAYLAR) 1.5 MG capsule, Take 1 capsule (1.5 mg total) by mouth daily., Disp: 90 capsule, Rfl: 1   Cyanocobalamin (B-12) 1000 MCG SUBL, Place 1 tablet under the tongue daily., Disp: 30 tablet, Rfl: 0   Ginger, Zingiber officinalis, (GINGER ROOT PO), Take by mouth., Disp: , Rfl:    meloxicam (MOBIC) 15 MG tablet, Take 1 tablet (15 mg total) by mouth daily., Disp: 90 tablet, Rfl: 1   modafinil (PROVIGIL) 100 MG tablet, TAKE 1 TABLET(100 MG) BY MOUTH DAILY, Disp: 90 tablet, Rfl: 0   Omega-3 Fatty Acids (FISH OIL PO), Take by mouth daily., Disp: , Rfl:    pregabalin (LYRICA) 150 MG capsule, TAKE 1 CAPSULE(150 MG) BY MOUTH TWICE DAILY, Disp: 180 capsule, Rfl: 0   Semaglutide (RYBELSUS) 7 MG TABS, Take 7 mg by mouth daily., Disp: 90 tablet, Rfl: 0   solifenacin (VESICARE) 10 MG tablet, Take 1 tablet (10 mg total) by mouth daily., Disp: 90 tablet, Rfl: 3   TART CHERRY PO, Take by mouth daily., Disp: , Rfl:    TURMERIC PO, Take by mouth daily., Disp: , Rfl:    Vitamin D, Ergocalciferol, (DRISDOL) 1.25 MG (50000 UNIT) CAPS capsule, Take 1 capsule (50,000 Units total) by mouth every 7 (seven) days., Disp: 12 capsule, Rfl: 1   Plecanatide (TRULANCE) 3 MG TABS, Take 1 tablet by mouth daily at 12 noon. (Patient not taking: Reported on 12/31/2021), Disp: 90 tablet, Rfl: 1  No Known Allergies  I personally reviewed active problem list, medication list, allergies, family history, social history, health maintenance  with the patient/caregiver today.   ROS  Constitutional: Negative for fever or weight change.  Respiratory: Negative for cough and shortness of breath.   Cardiovascular: Negative for chest pain or palpitations.  Gastrointestinal: Negative for abdominal pain, no bowel changes.  Musculoskeletal: Negative for gait problem or joint swelling.  Skin: Negative for rash.  Neurological: Negative for dizziness or headache.  No other specific complaints in a complete review of systems (except as listed in HPI above).   Objective  Vitals:   12/31/21 0932  BP:  128/80  Pulse: 85  Resp: 16  SpO2: 98%  Weight: 184 lb (83.5 kg)  Height: 5\' 3"  (1.6 m)    Body mass index is 32.59 kg/m.  Physical Exam  Constitutional: Patient appears well-developed and well-nourished. Obese  No distress.  HEENT: head atraumatic, normocephalic, pupils equal and reactive to light, neck supple Cardiovascular: Normal rate, regular rhythm and normal heart sounds.  No murmur heard. No BLE edema. Pulmonary/Chest: Effort normal and breath sounds normal. No respiratory distress. Abdominal: Soft.  There is no tenderness. Psychiatric: Patient has a normal mood and affect. behavior is normal. Judgment and thought content normal.    PHQ2/9:    12/31/2021    9:31 AM 10/01/2021    8:02 AM 08/12/2021    9:45 AM 06/12/2021    2:59 PM 06/06/2021    7:59 AM  Depression screen PHQ 2/9  Decreased Interest 0 0 0 0 0  Down, Depressed, Hopeless 0 0 0 0 0  PHQ - 2 Score 0 0 0 0 0  Altered sleeping 3 0 0 0 0  Tired, decreased energy 3 3 0 0 0  Change in appetite 3 3 0 0 0  Feeling bad or failure about yourself  0 0 0 0 0  Trouble concentrating 3 0 0 0 0  Moving slowly or fidgety/restless 0 0 0 0 0  Suicidal thoughts 0 0 0 0 0  PHQ-9 Score 12 6 0 0 0  Difficult doing work/chores   Not difficult at all Not difficult at all Not difficult at all    phq 9 is positive   Fall Risk:    12/31/2021    9:31 AM 10/01/2021     8:02 AM 08/12/2021    9:45 AM 06/12/2021    2:59 PM 06/06/2021    7:59 AM  Fall Risk   Falls in the past year? 0 0 0 0 0  Number falls in past yr: 0 0 0 0 0  Injury with Fall? 0 0 0 0 0  Risk for fall due to : No Fall Risks No Fall Risks No Fall Risks  No Fall Risks  Follow up Falls prevention discussed Falls prevention discussed Falls prevention discussed  Falls prevention discussed     Functional Status Survey: Is the patient deaf or have difficulty hearing?: No Does the patient have difficulty seeing, even when wearing glasses/contacts?: No Does the patient have difficulty concentrating, remembering, or making decisions?: No Does the patient have difficulty walking or climbing stairs?: No Does the patient have difficulty dressing or bathing?: No Does the patient have difficulty doing errands alone such as visiting a doctor's office or shopping?: No    Assessment & Plan  1. Mood disorder (HCC)  - modafinil (PROVIGIL) 100 MG tablet; TAKE 1 TABLET(100 MG) BY MOUTH DAILY  Dispense: 90 tablet; Refill: 0 - pregabalin (LYRICA) 150 MG capsule; Take 1 capsule (150 mg total) by mouth 2 (two) times daily.  Dispense: 180 capsule; Refill: 1  2. Dyslipidemia   3. Snoring  - Ambulatory referral to Sleep Studies  4. Vitamin D deficiency   5. B12 deficiency   6. Fibromyalgia syndrome  - pregabalin (LYRICA) 150 MG capsule; Take 1 capsule (150 mg total) by mouth 2 (two) times daily.  Dispense: 180 capsule; Refill: 1  7. Neuropathic pain  - pregabalin (LYRICA) 150 MG capsule; Take 1 capsule (150 mg total) by mouth 2 (two) times daily.  Dispense: 180 capsule; Refill: 1  8. Insomnia due to  other mental disorder   9. Pre-diabetes  - Semaglutide (RYBELSUS) 7 MG TABS; Take 7 mg by mouth daily.  Dispense: 90 tablet; Refill: 1

## 2021-12-31 ENCOUNTER — Ambulatory Visit (INDEPENDENT_AMBULATORY_CARE_PROVIDER_SITE_OTHER): Payer: BC Managed Care – PPO | Admitting: Family Medicine

## 2021-12-31 ENCOUNTER — Encounter: Payer: Self-pay | Admitting: Family Medicine

## 2021-12-31 VITALS — BP 128/80 | HR 85 | Resp 16 | Ht 63.0 in | Wt 184.0 lb

## 2021-12-31 DIAGNOSIS — F39 Unspecified mood [affective] disorder: Secondary | ICD-10-CM | POA: Diagnosis not present

## 2021-12-31 DIAGNOSIS — E785 Hyperlipidemia, unspecified: Secondary | ICD-10-CM

## 2021-12-31 DIAGNOSIS — F99 Mental disorder, not otherwise specified: Secondary | ICD-10-CM

## 2021-12-31 DIAGNOSIS — E538 Deficiency of other specified B group vitamins: Secondary | ICD-10-CM

## 2021-12-31 DIAGNOSIS — F5105 Insomnia due to other mental disorder: Secondary | ICD-10-CM

## 2021-12-31 DIAGNOSIS — R7303 Prediabetes: Secondary | ICD-10-CM

## 2021-12-31 DIAGNOSIS — M792 Neuralgia and neuritis, unspecified: Secondary | ICD-10-CM

## 2021-12-31 DIAGNOSIS — R0683 Snoring: Secondary | ICD-10-CM

## 2021-12-31 DIAGNOSIS — M797 Fibromyalgia: Secondary | ICD-10-CM

## 2021-12-31 DIAGNOSIS — E559 Vitamin D deficiency, unspecified: Secondary | ICD-10-CM

## 2021-12-31 MED ORDER — RYBELSUS 7 MG PO TABS: 7.0000 mg | ORAL_TABLET | Freq: Every day | ORAL | 1 refills | Status: DC

## 2021-12-31 MED ORDER — MODAFINIL 100 MG PO TABS: ORAL_TABLET | ORAL | 0 refills | Status: DC

## 2021-12-31 MED ORDER — PREGABALIN 150 MG PO CAPS: 150.0000 mg | ORAL_CAPSULE | Freq: Two times a day (BID) | ORAL | 1 refills | Status: DC

## 2022-01-05 ENCOUNTER — Telehealth: Payer: Self-pay | Admitting: Family Medicine

## 2022-01-05 NOTE — Telephone Encounter (Signed)
Tried calling to clarify type of study. Left vm stating a baseline and gave callback number.

## 2022-01-05 NOTE — Telephone Encounter (Signed)
Copied from CRM [id number]. Topic: Referral - Status >> Jan 05, 2022  1:54 PM Lyman Speller wrote: Reason for CRM: [facility] medical sleep lab needs to know what type of sleep study the pt needs/ they received referral / please advise/ fax# [phone]

## 2022-01-23 ENCOUNTER — Telehealth: Payer: Self-pay | Admitting: Family Medicine

## 2022-01-23 NOTE — Telephone Encounter (Signed)
Faxed

## 2022-01-23 NOTE — Telephone Encounter (Signed)
Copied from CRM [id number]. Topic: Referral - Question >> Jan 23, 2022 11:17 AM Everette C wrote: Reason for CRM: Misty Stanley with the sleep lab at [facility] has called to request additional information related to the patient's recent referral   Misty Stanley would like to know the study type needed for the patient   Please contact further when possible

## 2022-03-03 ENCOUNTER — Ambulatory Visit: Payer: Self-pay

## 2022-03-03 NOTE — Patient Outreach (Signed)
  Care Coordination   Initial Visit Note   03/03/2022 Name: Deborah Navarro MRN: [id number] DOB: May 03, 1981  Deborah Navarro is a 41 y.o. year old female who sees [name], MD for primary care. I spoke with  [name] by phone today.  What matters to the patients health and wellness today?  Referral to sleep apnea department at [facility] and the patient has not heard back from them.    Goals Addressed             This Visit's Progress    RNCM: OSA       Care Coordination Interventions: Evaluation of current treatment plan related to OSA and patient's adherence to plan as established by provider Advised patient to try to call the number to the sleep center and follow up with sleep apnea referral Provided education to patient re: OSA and the [facility] will follow up on referral to [facility]. Will attach information in AVS on OSA Collaborated with pcp and CMA regarding referral to [facility] sleep study and ask for recommendations on the best way to check on referral Provided patient with sleep apnea  educational materials related to OSA Reviewed scheduled/upcoming provider appointments including 07-01-2021 at 1020 am Discussed plans with patient for ongoing care management follow up and provided patient with direct contact information for care management team Advised patient to discuss changes in OSA and other chronic conditions with provider Screening for signs and symptoms of depression related to chronic disease state  Assessed social determinant of health barriers The patient agrees to work with the [facility] in the care coordination program.The goals of the program reviewed with the patient and the patient is receptive to [name]. The patient has the [facility] number to call for needs between [place].             SDOH assessments and interventions completed:  Yes  SDOH Interventions Today    Flowsheet Row Most Recent Value  SDOH Interventions   Food Insecurity  Interventions Intervention Not Indicated  Housing Interventions Intervention Not Indicated  Transportation Interventions Intervention Not Indicated  Utilities Interventions Intervention Not Indicated  Alcohol Usage Interventions Intervention Not Indicated (Score <7)  Financial Strain Interventions Intervention Not Indicated  Stress Interventions Intervention Not Indicated        Care Coordination Interventions Activated:  Yes  Care Coordination Interventions:  Yes, provided   Follow up plan: Follow up call scheduled for 04-22-2022 at 3 pm    Encounter Outcome:  Pt. Visit Completed   Noreene Larsson RN, MSN, [place]  Mobile: [phone]

## 2022-03-03 NOTE — Patient Instructions (Signed)
Visit Information  Thank you for taking time to visit with me today. Please don't hesitate to contact me if I can be of assistance to you.   Following are the goals we discussed today:   Goals Addressed             This Visit's Progress    RNCM: OSA       Care Coordination Interventions: Evaluation of current treatment plan related to OSA and patient's adherence to plan as established by provider Advised patient to try to call the number to the sleep center and follow up with sleep apnea referral Provided education to patient re: OSA and the [facility] will follow up on referral to [facility]. Will attach information in AVS on OSA Collaborated with pcp and CMA regarding referral to [facility] sleep study and ask for recommendations on the best way to check on referral Provided patient with sleep apnea  educational materials related to OSA Reviewed scheduled/upcoming provider appointments including 07-01-2021 at 1020 am Discussed plans with patient for ongoing care management follow up and provided patient with direct contact information for care management team Advised patient to discuss changes in OSA and other chronic conditions with provider Screening for signs and symptoms of depression related to chronic disease state  Assessed social determinant of health barriers The patient agrees to work with the [facility] in the care coordination program.The goals of the program reviewed with the patient and the patient is receptive to [employer]. The patient has the [facility] number to call for needs between outreaches.             Our next appointment is by telephone on 04-22-2022 at 3 pm  Please call the care guide team at [phone] if you need to cancel or reschedule your appointment.   If you are experiencing a Mental Health or Behavioral Health Crisis or need someone to talk to, please call the Suicide and Crisis Lifeline: 988 call the [place] National Suicide Prevention Lifeline: 530-324-2536 or TTY:  870-656-7811 TTY 930-044-2028) to talk to a trained counselor call 1-800-273-TALK (toll free, 24 hour hotline)  Patient verbalizes understanding of instructions and care plan provided today and agrees to view in MyChart. Active MyChart status and patient understanding of how to access instructions and care plan via MyChart confirmed with patient.     Telephone follow up appointment with care management team member scheduled for: 04-22-2022 at 3 pm  Alto [place] RN, MSN, CCM [facility] Coordinator [place]  Mobile: [phone]     Sleep Apnea Sleep apnea affects breathing during sleep. It causes breathing to stop for 10 seconds or more, or to become shallow. People with sleep apnea usually snore loudly. It can also increase the risk of: Heart attack. Stroke. Being very overweight (obese). Diabetes. Heart failure. Irregular heartbeat. High blood pressure. The goal of treatment is to help you breathe normally again. What are the causes?  The most common cause of this condition is a collapsed or blocked airway. There are three kinds of sleep apnea: Obstructive sleep apnea. This is caused by a blocked or collapsed airway. Central sleep apnea. This happens when the brain does not send the right signals to the muscles that control breathing. Mixed sleep apnea. This is a combination of obstructive and central sleep apnea. What increases the risk? Being overweight. Smoking. Having a small airway. Being older. Being female. Drinking alcohol. Taking medicines to calm yourself (sedatives or tranquilizers). Having family members with the condition. Having a tongue or tonsils  that are larger than normal. What are the signs or symptoms? Trouble staying asleep. Loud snoring. Headaches in the morning. Waking up gasping. Dry mouth or sore throat in the morning. Being sleepy or tired during the day. If you are sleepy or tired during the day, you may also: Not be able to focus your  mind (concentrate). Forget things. Get angry a lot and have mood swings. Feel sad (depressed). Have changes in your personality. Have less interest in sex, if you are female. Be unable to have an erection, if you are female. How is this treated?  Sleeping on your side. Using a medicine to get rid of mucus in your nose (decongestant). Avoiding the use of alcohol, medicines to help you relax, or certain pain medicines (narcotics). Losing weight, if needed. Changing your diet. Quitting smoking. Using a machine to open your airway while you sleep, such as: An oral appliance. This is a mouthpiece that shifts your lower jaw forward. A CPAP device. This device blows air through a mask when you breathe out (exhale). An EPAP device. This has valves that you put in each nostril. A BIPAP device. This device blows air through a mask when you breathe in (inhale) and breathe out. Having surgery if other treatments do not work. Follow these instructions at home: Lifestyle Make changes that your doctor recommends. Eat a healthy diet. Lose weight if needed. Avoid alcohol, medicines to help you relax, and some pain medicines. Do not smoke or use any products that contain nicotine or tobacco. If you need help quitting, ask your doctor. General instructions Take over-the-counter and prescription medicines only as told by your doctor. If you were given a machine to use while you sleep, use it only as told by your doctor. If you are having surgery, make sure to tell your doctor you have sleep apnea. You may need to bring your device with you. Keep all follow-up visits. Contact a doctor if: The machine that you were given to use during sleep bothers you or does not seem to be working. You do not get better. You get worse. Get help right away if: Your chest hurts. You have trouble breathing in enough air. You have an uncomfortable feeling in your back, arms, or stomach. You have trouble talking. One  side of your body feels weak. A part of your face is hanging down. These symptoms may be an emergency. Get help right away. Call your local emergency services (911 in the [place] Do not wait to see if the symptoms will go away. Do not drive yourself to the hospital. Summary This condition affects breathing during sleep. The most common cause is a collapsed or blocked airway. The goal of treatment is to help you breathe normally while you sleep. This information is not intended to replace advice given to you by your health care provider. Make sure you discuss any questions you have with your health care provider. Document Revised: 01/08/2021 Document Reviewed: 05/10/2020 Elsevier Patient Education  [place].

## 2022-03-04 ENCOUNTER — Other Ambulatory Visit: Payer: Self-pay | Admitting: Family Medicine

## 2022-03-04 DIAGNOSIS — R0683 Snoring: Secondary | ICD-10-CM

## 2022-03-04 DIAGNOSIS — R400 Somnolence: Secondary | ICD-10-CM

## 2022-03-30 ENCOUNTER — Other Ambulatory Visit: Payer: Self-pay | Admitting: Family Medicine

## 2022-03-30 DIAGNOSIS — F39 Unspecified mood [affective] disorder: Secondary | ICD-10-CM

## 2022-03-31 ENCOUNTER — Other Ambulatory Visit: Payer: Self-pay | Admitting: Family Medicine

## 2022-03-31 DIAGNOSIS — Z1231 Encounter for screening mammogram for malignant neoplasm of breast: Secondary | ICD-10-CM

## 2022-04-05 ENCOUNTER — Other Ambulatory Visit: Payer: Self-pay | Admitting: Family Medicine

## 2022-04-05 DIAGNOSIS — F39 Unspecified mood [affective] disorder: Secondary | ICD-10-CM

## 2022-04-16 ENCOUNTER — Other Ambulatory Visit: Payer: Self-pay | Admitting: Family Medicine

## 2022-04-21 ENCOUNTER — Encounter: Payer: Self-pay | Admitting: Adult Health

## 2022-04-21 ENCOUNTER — Ambulatory Visit (INDEPENDENT_AMBULATORY_CARE_PROVIDER_SITE_OTHER): Payer: BC Managed Care – PPO | Admitting: Adult Health

## 2022-04-21 VITALS — BP 124/74 | HR 82 | Temp 97.9°F | Ht 63.0 in | Wt 192.0 lb

## 2022-04-21 DIAGNOSIS — R0683 Snoring: Secondary | ICD-10-CM | POA: Insufficient documentation

## 2022-04-21 DIAGNOSIS — G4719 Other hypersomnia: Secondary | ICD-10-CM | POA: Diagnosis not present

## 2022-04-21 NOTE — Progress Notes (Signed)
@Patient  ID: Deborah Navarro, female    DOB: Oct 23, 1980, 41 y.o.   MRN: [id number]  Chief Complaint  Patient presents with   sleep consult    Referring provider: [name], MD  HPI: 41 year old female seen for sleep consult April 21, 2022 for snoring, restless sleep and daytime sleepiness  TEST/EVENTS :   04/21/2022 Sleep consult  Patient presents for sleep consult.  Kindly referred by primary care provider Dr. Ancil Boozer.  Patient complains of snoring, restless sleep and daytime sleepiness.  Typically goes to bed about 8 to 9 PM.  Takes about 30 minutes to go to sleep.  Goes to sleep okay but wakes up frequently . Is up up to 6 times each night.  Gets up at 6 AM.  Weight is up 50 pounds over the last 2 years current weight is at 192 pounds with a BMI at 34. Wakes up very tired no matter how much she sleeps. Lack of energy .  No previous sleep study.  Minimal caffeine intake- 1 cup of coffee.  No history of congestive heart failure or stroke.  No removable dental work.  No symptoms suspicious for cataplexy or sleep paralysis.  Patient has underlying mood disorder and fibromyalgia.  Is on Lyrica 150 mg twice daily along with Provigil for daytime fatigue. Provigil helps but still tired. Will take nap on day off, usually can take 2-3 hrs.  Epworth score is 11 out of 24.  Typically gets sleepy if she sits down to read or watch TV.  And in the afternoon hours.  Also after lunch. No sleep aides.   Past Surgical History:  Procedure Laterality Date   COLONOSCOPY WITH PROPOFOL N/A 05/16/2018   Procedure: COLONOSCOPY WITH PROPOFOL;  Surgeon: [name], MD;  Location: [facility];  Service: Endoscopy;  Laterality: N/A;   DILATION AND CURETTAGE OF UTERUS     LAPAROSCOPIC ENDOMETRIOSIS FULGURATION  10-1999   endometriosis   NECK SURGERY N/A 06/14/15   due to C5/6 C6-7 disc herniation    open heart surgery  12-1994   asd repair   Social history patient is married.  Has adult child  . Never smoker No  alcohol.  No drug use.  Works in [employer].   Family history positive for heart disease and cancer  No Known Allergies  Immunization History  Administered Date(s) Administered   Influenza Split 02/26/2011   Influenza,inj,Quad PF,6+ Mos 03/15/2015, 03/23/2018, 03/25/2021   Influenza-Unspecified 03/23/2019, 05/07/2020   Tdap 03/15/2015    Past Medical History:  Diagnosis Date   Anemia, unspecified    ASD (atrial septal defect)    repaired 1996   Blood transfusion, without reported diagnosis    Depressive disorder, not elsewhere classified    Elevated blood pressure reading without diagnosis of hypertension    Endometriosis of fallopian tube    Family history of adverse reaction to anesthesia    Father - PONV   Irritable bowel syndrome    PONV (postoperative nausea and vomiting)    Streptococcal meningitis    Undiagnosed cardiac murmurs    Unspecified personal history presenting hazards to health    Wears contact lenses     Tobacco History: Social History   Tobacco Use  Smoking Status Never  Smokeless Tobacco Never   Counseling given: Not Answered   Outpatient Medications Prior to Visit  Medication Sig Dispense Refill   Cyanocobalamin (B-12) 1000 MCG SUBL Place 1 tablet under the tongue daily. 30 tablet 0   Ginger,  Zingiber officinalis, (GINGER ROOT PO) Take by mouth.     meloxicam (MOBIC) 15 MG tablet Take 1 tablet (15 mg total) by mouth daily. 90 tablet 1   modafinil (PROVIGIL) 100 MG tablet TAKE 1 TABLET(100 MG) BY MOUTH DAILY 90 tablet 0   Omega-3 Fatty Acids (FISH OIL PO) Take by mouth daily.     pregabalin (LYRICA) 150 MG capsule Take 1 capsule (150 mg total) by mouth 2 (two) times daily. 180 capsule 1   Semaglutide (RYBELSUS) 7 MG TABS Take 7 mg by mouth daily. 90 tablet 1   TART CHERRY PO Take by mouth daily.     TURMERIC PO Take by mouth daily.     Vitamin D, Ergocalciferol, (DRISDOL) 1.25 MG (50000 UNIT) CAPS capsule Take 1 capsule  (50,000 Units total) by mouth every 7 (seven) days. 12 capsule 1   VRAYLAR 1.5 MG capsule TAKE ONE CAPSULE BY MOUTH DAILY 90 capsule 0   Plecanatide (TRULANCE) 3 MG TABS TAKE 1 TABLET BY MOUTH EVERY DAY AT NOON (Patient not taking: Reported on 04/21/2022) 90 tablet 0   No facility-administered medications prior to visit.     Review of Systems:   Constitutional:   No  weight loss, night sweats,  Fevers, chills,  +fatigue, or  lassitude.  HEENT:   No headaches,  Difficulty swallowing,  Tooth/dental problems, or  Sore throat,                No sneezing, itching, ear ache, nasal congestion, post nasal drip,   CV:  No chest pain,  Orthopnea, PND, swelling in lower extremities, anasarca, dizziness, palpitations, syncope.   GI  No heartburn, indigestion, abdominal pain, nausea, vomiting, diarrhea, change in bowel habits, loss of appetite, bloody stools.   Resp: No shortness of breath with exertion or at rest.  No excess mucus, no productive cough,  No non-productive cough,  No coughing up of blood.  No change in color of mucus.  No wheezing.  No chest wall deformity  Skin: no rash or lesions.  GU: no dysuria, change in color of urine, no urgency or frequency.  No flank pain, no hematuria   MS:  No joint pain or swelling.  No decreased range of motion.  No back pain.    Physical Exam  BP 124/74 (BP Location: Left Arm, Cuff Size: Normal)   Pulse 82   Temp 97.9 F (36.6 C) (Temporal)   Ht 5\' 3"  (1.6 m)   Wt 192 lb (87.1 kg)   SpO2 99%   BMI 34.01 kg/m   GEN: A/Ox3; pleasant , NAD, well nourished    HEENT:  [place]/AT,   NOSE-clear, THROAT-clear, no lesions, no postnasal drip or exudate noted. Class III MP airway   NECK:  Supple w/ fair ROM; no JVD; normal carotid impulses w/o bruits; no thyromegaly or nodules palpated; no lymphadenopathy.    RESP  Clear  P & A; w/o, wheezes/ rales/ or rhonchi. no accessory muscle use, no dullness to percussion  CARD:  RRR, no m/r/g, no peripheral  edema, pulses intact, no cyanosis or clubbing.  GI:   Soft & nt; nml bowel sounds; no organomegaly or masses detected.   Musco: Warm bil, no deformities or joint swelling noted.   Neuro: alert, no focal deficits noted.    Skin: Warm, no lesions or rashes    Lab Results:  CBC    BNP No results found for: "BNP"  ProBNP No results found for: "PROBNP"  Imaging: No  results found.        No data to display          No results found for: "NITRICOXIDE"      Assessment & Plan:   Snoring Snoring , daytime sleepiness, and restless sleep all concerning for underlying sleep apnea.  We will set patient up for home sleep study.  Patient education was given - discussed how weight can impact sleep and risk for sleep disordered breathing - discussed options to assist with weight loss: combination of diet modification, cardiovascular and strength training exercises   - had an extensive discussion regarding the adverse health consequences related to untreated sleep disordered breathing - specifically discussed the risks for hypertension, coronary artery disease, cardiac dysrhythmias, cerebrovascular disease, and diabetes - lifestyle modification discussed   - discussed how sleep disruption can increase risk of accidents, particularly when driving - safe driving practices were discussed  Plan  Patient Instructions  Set up for home sleep study  Healthy weight loss  Healthy sleep regimen  Do not drive if sleepy  Follow up in 6-8 weeks to discuss results and treatment plan         Morbid obesity (HCC) Healthy weight loss      Rubye Oaks, NP 04/21/2022

## 2022-04-21 NOTE — Assessment & Plan Note (Signed)
Snoring , daytime sleepiness, and restless sleep all concerning for underlying sleep apnea.  We will set patient up for home sleep study.  Patient education was given - discussed how weight can impact sleep and risk for sleep disordered breathing - discussed options to assist with weight loss: combination of diet modification, cardiovascular and strength training exercises   - had an extensive discussion regarding the adverse health consequences related to untreated sleep disordered breathing - specifically discussed the risks for hypertension, coronary artery disease, cardiac dysrhythmias, cerebrovascular disease, and diabetes - lifestyle modification discussed   - discussed how sleep disruption can increase risk of accidents, particularly when driving - safe driving practices were discussed  Plan  Patient Instructions  Set up for home sleep study  Healthy weight loss  Healthy sleep regimen  Do not drive if sleepy  Follow up in 6-8 weeks to discuss results and treatment plan

## 2022-04-21 NOTE — Assessment & Plan Note (Signed)
Healthy weight loss 

## 2022-04-21 NOTE — Patient Instructions (Signed)
Set up for home sleep study  Healthy weight loss  Healthy sleep regimen  Do not drive if sleepy  Follow up in 6-8 weeks to discuss results and treatment plan

## 2022-04-22 ENCOUNTER — Ambulatory Visit: Payer: Self-pay | Admitting: *Deleted

## 2022-04-22 NOTE — Progress Notes (Signed)
Reviewed and agree with assessment/plan.   Larose Batres, MD [facility] 04/22/2022, 8:14 AM Pager:  [phone]  

## 2022-04-22 NOTE — Patient Outreach (Signed)
  Care Coordination   Follow Up Visit Note   04/22/2022 Name: Deborah Navarro MRN: [id number] DOB: [birth year]  Deborah Navarro is a 41 y.o. year old female who sees Deborah Cory, MD for primary care. I spoke with  Deborah Navarro by phone today.  What matters to the patients health and wellness today?  Sleep apnea, currently being addressed.  Denies any urgent concerns, encouraged to contact this care manager with questions.      Goals Addressed             This Visit's Progress    RNCM: OSA   On track    Care Coordination Interventions: Evaluation of current treatment plan related to OSA and patient's adherence to plan as established by provider Advised patient to Continue current therapy and follow up with sleep center in the next 4 weeks if no call back to pick up machine for home study Provided education to patient re: OSA and the RNCM will follow up on referral to [facility]. Will attach information in AVS on OSA Provided patient with sleep apnea  educational materials related to OSA Reviewed scheduled/upcoming provider appointments including 12/4 for mammogram, 12/11 with GYN, 12/18 with urology, and 07-01-2022 with PCP. Discussed plans with patient for ongoing care management follow up and provided patient with direct contact information for care management team Advised patient to discuss changes in OSA and other chronic conditions with provider The patient agrees to work with the [facility] in the care coordination program.The goals of the program reviewed with the patient and the patient is receptive to [employer]. The patient has the [facility] number to call for needs between outreaches.   Completed sleep consult yesterday.  Will have home sleep study done within the next 4-6 weeks.           SDOH assessments and interventions completed:  No     Care Coordination Interventions Activated:  Yes  Care Coordination Interventions:  Yes, provided   Follow up plan: Follow  up call scheduled for 06/16/2022    Encounter Outcome:  Pt. Visit Completed   Kemper Durie, RN, MSN, [facility] Care Management Care Management Coordinator [phone]

## 2022-04-22 NOTE — Patient Instructions (Signed)
Visit Information  Thank you for taking time to visit with me today. Please don't hesitate to contact me if I can be of assistance to you before our next scheduled telephone appointment.  Following are the goals we discussed today:  If no call to pick up sleep study equipment within the next 4-6 weeks, call office to follow up.  Our next appointment is by telephone on 06/16/2022  Please call the care guide team at [phone] if you need to cancel or reschedule your appointment.   Please call the Suicide and Crisis Lifeline: 988 call the [place] National Suicide Prevention Lifeline: 2164553489 or TTY: 8546501276 TTY [phone]) to talk to a trained counselor call 1-800-273-TALK (toll free, 24 hour hotline) call 911 if you are experiencing a Mental Health or Behavioral Health Crisis or need someone to talk to.  Patient verbalizes understanding of instructions and care plan provided today and agrees to view in MyChart. Active MyChart status and patient understanding of how to access instructions and care plan via MyChart confirmed with patient.     The patient has been provided with contact information for the care management team and has been advised to call with any health related questions or concerns.   Kemper Durie, RN, MSN, [facility] Care Management Care Management Coordinator [phone]

## 2022-04-23 ENCOUNTER — Encounter: Payer: Self-pay | Admitting: Nurse Practitioner

## 2022-04-23 ENCOUNTER — Ambulatory Visit (INDEPENDENT_AMBULATORY_CARE_PROVIDER_SITE_OTHER): Payer: BC Managed Care – PPO | Admitting: Nurse Practitioner

## 2022-04-23 ENCOUNTER — Ambulatory Visit
Admission: RE | Admit: 2022-04-23 | Discharge: 2022-04-23 | Disposition: A | Discharge status: Home / Self Care | Payer: BC Managed Care – PPO | Source: Ambulatory Visit | Attending: Nurse Practitioner | Admitting: Nurse Practitioner

## 2022-04-23 ENCOUNTER — Encounter: Payer: Self-pay | Admitting: Family Medicine

## 2022-04-23 ENCOUNTER — Other Ambulatory Visit: Payer: Self-pay

## 2022-04-23 ENCOUNTER — Other Ambulatory Visit
Admission: RE | Admit: 2022-04-23 | Discharge: 2022-04-23 | Disposition: A | Discharge status: Home / Self Care | Payer: BC Managed Care – PPO | Source: Home / Self Care | Attending: Nurse Practitioner | Admitting: Nurse Practitioner

## 2022-04-23 VITALS — BP 118/72 | HR 92 | Temp 98.4°F | Resp 18 | Ht 63.0 in | Wt 192.0 lb

## 2022-04-23 DIAGNOSIS — R109 Unspecified abdominal pain: Secondary | ICD-10-CM | POA: Insufficient documentation

## 2022-04-23 LAB — COMPREHENSIVE METABOLIC PANEL
ALT: 14 U/L (ref 0–44)
AST: 18 U/L (ref 15–41)
Albumin: 3.9 g/dL (ref 3.5–5.0)
Alkaline Phosphatase: 63 U/L (ref 38–126)
Anion gap: 8 (ref 5–15)
BUN: 13 mg/dL (ref 6–20)
CO2: 25 mmol/L (ref 22–32)
Calcium: 8.7 mg/dL — ABNORMAL LOW (ref 8.9–10.3)
Chloride: 105 mmol/L (ref 98–111)
Creatinine, Ser: 0.94 mg/dL (ref 0.44–1.00)
GFR, Estimated: >60 mL/min (ref >60)
Glucose, Bld: 91 mg/dL (ref 70–99)
Potassium: 4.1 mmol/L (ref 3.5–5.1)
Sodium: 138 mmol/L (ref 135–145)
Total Bilirubin: 0.6 mg/dL (ref 0.3–1.2)
Total Protein: 7.5 g/dL (ref 6.5–8.1)

## 2022-04-23 LAB — POCT URINALYSIS DIPSTICK
Bilirubin, UA: NEGATIVE
Glucose, UA: NEGATIVE
Ketones, UA: NEGATIVE
Leukocytes, UA: NEGATIVE
Nitrite, UA: NEGATIVE
Protein, UA: POSITIVE — AB
Spec Grav, UA: 1.02 (ref 1.010–1.025)
Urobilinogen, UA: 0.2 E.U./dL
pH, UA: 5 (ref 5.0–8.0)

## 2022-04-23 LAB — CBC WITH DIFFERENTIAL/PLATELET
Abs Immature Granulocytes: 0.01 10*3/uL (ref 0.00–0.07)
Basophils Absolute: 0.1 10*3/uL (ref 0.0–0.1)
Basophils Relative: 1 %
Eosinophils Absolute: 0.1 10*3/uL (ref 0.0–0.5)
Eosinophils Relative: 1 %
HCT: 34.3 % — ABNORMAL LOW (ref 36.0–46.0)
Hemoglobin: 11.6 g/dL — ABNORMAL LOW (ref 12.0–15.0)
Immature Granulocytes: 0 %
Lymphocytes Relative: 46 %
Lymphs Abs: 2.8 10*3/uL (ref 0.7–4.0)
MCH: 29.4 pg (ref 26.0–34.0)
MCHC: 33.8 g/dL (ref 30.0–36.0)
MCV: 87.1 fL (ref 80.0–100.0)
Monocytes Absolute: 0.6 10*3/uL (ref 0.1–1.0)
Monocytes Relative: 9 %
Neutro Abs: 2.7 10*3/uL (ref 1.7–7.7)
Neutrophils Relative %: 43 %
Platelets: 273 10*3/uL (ref 150–400)
RBC: 3.94 MIL/uL (ref 3.87–5.11)
RDW: 12.6 % (ref 11.5–15.5)
WBC: 6.2 10*3/uL (ref 4.0–10.5)
nRBC: 0 % (ref 0.0–0.2)

## 2022-04-23 LAB — POCT URINE PREGNANCY: Preg Test, Ur: NEGATIVE

## 2022-04-23 MED ORDER — IOHEXOL 300 MG/ML  SOLN
100.0000 mL | Freq: Once | INTRAMUSCULAR | Status: AC | PRN
  Administered 2022-04-23: 100 mL via INTRAVENOUS

## 2022-04-23 NOTE — Progress Notes (Signed)
BP 118/72   Pulse 92   Temp 98.4 F (36.9 C) (Oral)   Resp 18   Ht 5\' 3"  (1.6 m)   Wt 192 lb (87.1 kg)   SpO2 100%   BMI 34.01 kg/m    Subjective:    Patient ID: , female    DOB: Sep 05, 1980, 41 y.o.   MRN: [id number]  HPI: Deborah Navarro is a 41 y.o. female  Chief Complaint  Patient presents with   Pain    Around navel area for 3 days   Abdominal pain: Patient states that her abdominal pain started on Tuesday.  She says that the pain is worse when she leans forward or when she goes to get out of her vehicle. She says standing up the pain goes away.  She denies any fever.  She denies any diarrhea or constipation.  She does says that she has had some nausea.  She describes the pain as sharp.  She says that the pain does not radiate anywhere.  Although she does have a history of constipation.  She denies any urinary frequency and urgency.  She denies having this pain before.  Patient reports her last menstrual cycle was 03/27/2022.  Urine dip shows positive for protein, and small blood otherwise negative. She does lift heavy objects at work.  She denies any acid reflux or indigestion.   Relevant past medical, surgical, family and social history reviewed and updated as indicated. Interim medical history since our last visit reviewed. Allergies and medications reviewed and updated.  Review of Systems  Constitutional: Negative for fever or weight change.  Respiratory: Negative for cough and shortness of breath.   Cardiovascular: Negative for chest pain or palpitations.  Gastrointestinal: positive for abdominal pain, no bowel changes.  Musculoskeletal: Negative for gait problem or joint swelling.  Skin: Negative for rash.  Neurological: Negative for dizziness or headache.  No other specific complaints in a complete review of systems (except as listed in HPI above).      Objective:    BP 118/72   Pulse 92   Temp 98.4 F (36.9 C) (Oral)   Resp 18   Ht  5\' 3"  (1.6 m)   Wt 192 lb (87.1 kg)   SpO2 100%   BMI 34.01 kg/m   Wt Readings from Last 3 Encounters:  04/23/22 192 lb (87.1 kg)  04/21/22 192 lb (87.1 kg)  12/31/21 184 lb (83.5 kg)    Physical Exam  Constitutional: Patient appears well-developed and well-nourished. Obese  No distress.  HEENT: head atraumatic, normocephalic, pupils equal and reactive to light, neck supple Cardiovascular: Normal rate, regular rhythm and normal heart sounds.  No murmur heard. No BLE edema. Pulmonary/Chest: Effort normal and breath sounds normal. No respiratory distress. Abdominal: Soft.  Tenderness noted in the umbilical region Psychiatric: Patient has a normal mood and affect. behavior is normal. Judgment and thought content normal.     Assessment & Plan:   Problem List Items Addressed This Visit   None Visit Diagnoses     Abdominal pain, unspecified abdominal location    -  Primary   getting stat labs, ct abdomen pelvis, urine and urpreg done   Relevant Orders   POCT urinalysis dipstick (Completed)   CT Abdomen Pelvis W Contrast   CBC with Differential/Platelet   Comprehensive metabolic panel   POCT urine pregnancy (Completed)       Discussed with patient that if pain increases she needs to go directly to the  emergency department.   Follow up plan: Return if symptoms worsen or fail to improve.

## 2022-04-28 DIAGNOSIS — G4733 Obstructive sleep apnea (adult) (pediatric): Secondary | ICD-10-CM | POA: Diagnosis not present

## 2022-04-28 DIAGNOSIS — R0602 Shortness of breath: Secondary | ICD-10-CM | POA: Diagnosis not present

## 2022-04-29 DIAGNOSIS — R0602 Shortness of breath: Secondary | ICD-10-CM | POA: Diagnosis not present

## 2022-04-29 DIAGNOSIS — G4733 Obstructive sleep apnea (adult) (pediatric): Secondary | ICD-10-CM | POA: Diagnosis not present

## 2022-05-05 ENCOUNTER — Telehealth: Payer: Self-pay | Admitting: Adult Health

## 2022-05-05 NOTE — Telephone Encounter (Signed)
Called and spoke with pt who states she had a sleep study performed about a week ago. Stated to her that we would call her once the results were available. Stated to her to disregard contact from [place] that she received as no order has been placed for cpap start due to not having results available at this time and she verbalized understanding. Nothing further needed.

## 2022-05-12 NOTE — Telephone Encounter (Unsigned)
Copied from CRM [id number]. Topic: General - Other >> May 11, 2022  4:03 PM Everette C wrote: Reason for CRM: The patient has requested to speak with a member of clinical staff about their sleep study results when possible   Please contact further when possible

## 2022-05-18 ENCOUNTER — Ambulatory Visit
Admission: RE | Admit: 2022-05-18 | Discharge: 2022-05-18 | Disposition: A | Discharge status: Home / Self Care | Payer: BC Managed Care – PPO | Source: Ambulatory Visit | Attending: Family Medicine | Admitting: Family Medicine

## 2022-05-18 DIAGNOSIS — Z1231 Encounter for screening mammogram for malignant neoplasm of breast: Secondary | ICD-10-CM | POA: Diagnosis not present

## 2022-05-19 ENCOUNTER — Encounter: Payer: Self-pay | Admitting: Family Medicine

## 2022-05-21 ENCOUNTER — Telehealth: Payer: Self-pay | Admitting: Adult Health

## 2022-05-21 NOTE — Telephone Encounter (Signed)
Home sleep study done on April 28, 2022 showed very severe sleep apnea with AHI of 40.5/hour Please set up office visit or virtual visit to discuss sleep study results and treatment plan

## 2022-05-21 NOTE — Telephone Encounter (Signed)
Called and spoke with patient, advised of results/recommendations per Rubye Oaks NP.  Scheduled mychart visit for 11 am on 05/22/22.  Advised to sign in at 10:45 am.  She verbalized understanding.  Nothing further needed.

## 2022-05-22 ENCOUNTER — Telehealth (INDEPENDENT_AMBULATORY_CARE_PROVIDER_SITE_OTHER): Payer: BC Managed Care – PPO | Admitting: Adult Health

## 2022-05-22 DIAGNOSIS — G4733 Obstructive sleep apnea (adult) (pediatric): Secondary | ICD-10-CM | POA: Diagnosis not present

## 2022-05-22 DIAGNOSIS — G4719 Other hypersomnia: Secondary | ICD-10-CM

## 2022-05-22 NOTE — Addendum Note (Signed)
Addended by: Delrae Rend on: 05/22/2022 11:21 AM   Modules accepted: Orders

## 2022-05-22 NOTE — Progress Notes (Signed)
Virtual Visit via Video Note  I connected with Deborah Navarro on 05/22/22 at 11:00 AM EST by a video enabled telemedicine application and verified that I am speaking with the correct person using two identifiers.  Location: Patient: Home  Provider: Office    I discussed the limitations of evaluation and management by telemedicine and the availability of in person appointments. The patient expressed understanding and agreed to proceed.  History of Present Illness: 41 year old female seen for sleep consult April 21, 2022 for snoring, restless sleep and daytime sleepiness.  She has significant daytime fatigue.  Today's video visit is a 1 month follow-up to discuss sleep study results.  Patient was seen last visit for sleep consult.  She has significant daytime sleepiness restless sleep fatigue and snoring.  She was set up for home sleep study that was completed on April 28, 2022 that showed very severe sleep apnea with a AHI at 40.5/hour.  We discussed her sleep study results in detail.  Went over treatment options.  Patient has been on Provigil for her daytime fatigue and sleepiness. She wants to proceed with CPAP therapy due to her severity of sleep apnea.  Past Medical History:  Diagnosis Date   Anemia, unspecified    ASD (atrial septal defect)    repaired 1996   Blood transfusion, without reported diagnosis    Depressive disorder, not elsewhere classified    Elevated blood pressure reading without diagnosis of hypertension    Endometriosis of fallopian tube    Family history of adverse reaction to anesthesia    Father - PONV   Irritable bowel syndrome    PONV (postoperative nausea and vomiting)    Streptococcal meningitis    Undiagnosed cardiac murmurs    Unspecified personal history presenting hazards to health    Wears contact lenses     Current Outpatient Medications on File Prior to Visit  Medication Sig Dispense Refill   Cyanocobalamin (B-12) 1000 MCG SUBL Place 1  tablet under the tongue daily. 30 tablet 0   meloxicam (MOBIC) 15 MG tablet Take 1 tablet (15 mg total) by mouth daily. 90 tablet 1   modafinil (PROVIGIL) 100 MG tablet TAKE 1 TABLET(100 MG) BY MOUTH DAILY 90 tablet 0   pregabalin (LYRICA) 150 MG capsule Take 1 capsule (150 mg total) by mouth 2 (two) times daily. 180 capsule 1   Semaglutide (RYBELSUS) 7 MG TABS Take 7 mg by mouth daily. 90 tablet 1   Vitamin D, Ergocalciferol, (DRISDOL) 1.25 MG (50000 UNIT) CAPS capsule Take 1 capsule (50,000 Units total) by mouth every 7 (seven) days. 12 capsule 1   VRAYLAR 1.5 MG capsule TAKE ONE CAPSULE BY MOUTH DAILY 90 capsule 0   Ginger, Zingiber officinalis, (GINGER ROOT PO) Take by mouth. (Patient not taking: Reported on 05/22/2022)     Omega-3 Fatty Acids (FISH OIL PO) Take by mouth daily. (Patient not taking: Reported on 05/22/2022)     TART CHERRY PO Take by mouth daily. (Patient not taking: Reported on 05/22/2022)     TURMERIC PO Take by mouth daily. (Patient not taking: Reported on 05/22/2022)     No current facility-administered medications on file prior to visit.      Observations/Objective: Home sleep study done on April 28, 2022 showed very severe sleep apnea with AHI of 40.5/hour   Assessment and Plan:  Severe obstructive sleep apnea.  Patient education was given on sleep apnea.  Patient will begin CPAP AutoSet 5 to 20 cm H2O.  Sleep  apnea with hypersomnia-continue with Provigil per previous directions.  As her sleep apnea gets controlled could consider trying off of Provigil to see if CPAP controls her daytime sleepiness.  Plan  Patient Instructions  Begin CPAP at bedtime.  Goal is to wear all night long for at least 6 or more hours Healthy weight loss  Healthy sleep regimen  Do not drive if sleepy  Follow up in 2 months and As needed     Follow Up Instructions: Follow up in 2 months and As needed     I discussed the assessment and treatment plan with the patient. The patient was  provided an opportunity to ask questions and all were answered. The patient agreed with the plan and demonstrated an understanding of the instructions.   The patient was advised to call back or seek an in-person evaluation if the symptoms worsen or if the condition fails to improve as anticipated.  I provided 22  minutes of non-face-to-face time during this encounter.   Rubye Oaks, NP

## 2022-05-22 NOTE — Patient Instructions (Addendum)
Begin CPAP at bedtime.  Goal is to wear all night long for at least 6 or more hours Healthy weight loss  Healthy sleep regimen  Do not drive if sleepy  Follow up in 2 months and As needed

## 2022-05-25 ENCOUNTER — Encounter: Payer: Self-pay | Admitting: Advanced Practice Midwife

## 2022-05-25 ENCOUNTER — Other Ambulatory Visit (HOSPITAL_COMMUNITY)
Admission: RE | Admit: 2022-05-25 | Discharge: 2022-05-25 | Disposition: A | Discharge status: Home / Self Care | Payer: BC Managed Care – PPO | Source: Ambulatory Visit | Attending: Advanced Practice Midwife | Admitting: Advanced Practice Midwife

## 2022-05-25 ENCOUNTER — Ambulatory Visit (INDEPENDENT_AMBULATORY_CARE_PROVIDER_SITE_OTHER): Payer: BC Managed Care – PPO | Admitting: Advanced Practice Midwife

## 2022-05-25 VITALS — BP 136/81 | HR 86 | Ht 64.0 in | Wt 197.0 lb

## 2022-05-25 DIAGNOSIS — Z23 Encounter for immunization: Secondary | ICD-10-CM

## 2022-05-25 DIAGNOSIS — Z124 Encounter for screening for malignant neoplasm of cervix: Secondary | ICD-10-CM | POA: Diagnosis not present

## 2022-05-25 DIAGNOSIS — Z01419 Encounter for gynecological examination (general) (routine) without abnormal findings: Secondary | ICD-10-CM | POA: Insufficient documentation

## 2022-05-25 NOTE — Progress Notes (Signed)
Reviewed and agree with assessment/plan.   Coralyn Helling, MD [facility] 05/25/2022, 7:09 AM Pager:  [phone]

## 2022-05-25 NOTE — Patient Instructions (Signed)
Mediterranean Diet ?A Mediterranean diet refers to food and lifestyle choices that are based on the traditions of countries located on the [place] Sea. It focuses on eating more fruits, vegetables, whole grains, beans, nuts, seeds, and heart-healthy fats, and eating less dairy, meat, eggs, and processed foods with added sugar, salt, and fat. This way of eating has been shown to help prevent certain conditions and improve outcomes for people who have chronic diseases, like kidney disease and heart disease. ?What are tips for following this plan? ?Reading food labels ?Check the serving size of packaged foods. For foods such as rice and pasta, the serving size refers to the amount of cooked product, not dry. ?Check the total fat in packaged foods. Avoid foods that have saturated fat or trans fats. ?Check the ingredient list for added sugars, such as corn syrup. ?Shopping ? ?Buy a variety of foods that offer a balanced diet, including: ?Fresh fruits and vegetables (produce). ?Grains, beans, nuts, and seeds. Some of these may be available in unpackaged forms or large amounts (in bulk). ?Fresh seafood. ?Poultry and eggs. ?Low-fat dairy products. ?Buy whole ingredients instead of prepackaged foods. ?Buy fresh fruits and vegetables in-season from local farmers markets. ?Buy plain frozen fruits and vegetables. ?If you do not have access to quality fresh seafood, buy precooked frozen shrimp or canned fish, such as tuna, salmon, or sardines. ?Stock your pantry so you always have certain foods on hand, such as olive oil, canned tuna, canned tomatoes, rice, pasta, and beans. ?Cooking ?Cook foods with extra-virgin olive oil instead of using butter or other vegetable oils. ?Have meat as a side dish, and have vegetables or grains as your main dish. This means having meat in small portions or adding small amounts of meat to foods like pasta or stew. ?Use beans or vegetables instead of meat in common dishes like chili or  lasagna. ?Experiment with different cooking methods. Try roasting, broiling, steaming, and saut?ing vegetables. ?Add frozen vegetables to soups, stews, pasta, or rice. ?Add nuts or seeds for added healthy fats and plant protein at each meal. You can add these to yogurt, salads, or vegetable dishes. ?Marinate fish or vegetables using olive oil, lemon juice, garlic, and fresh herbs. ?Meal planning ?Plan to eat one vegetarian meal one day each week. Try to work up to two vegetarian meals, if possible. ?Eat seafood two or more times a week. ?Have healthy snacks readily available, such as: ?Vegetable sticks with hummus. ?Greek yogurt. ?Fruit and nut trail mix. ?Eat balanced meals throughout the week. This includes: ?Fruit: 2-3 servings a day. ?Vegetables: 4-5 servings a day. ?Low-fat dairy: 2 servings a day. ?Fish, poultry, or lean meat: 1 serving a day. ?Beans and legumes: 2 or more servings a week. ?Nuts and seeds: 1-2 servings a day. ?Whole grains: 6-8 servings a day. ?Extra-virgin olive oil: 3-4 servings a day. ?Limit red meat and sweets to only a few servings a month. ?Lifestyle ? ?Cook and eat meals together with your family, when possible. ?Drink enough fluid to keep your urine pale yellow. ?Be physically active every day. This includes: ?Aerobic exercise like running or swimming. ?Leisure activities like gardening, walking, or housework. ?Get 7-8 hours of sleep each night. ?If recommended by your health care provider, drink red wine in moderation. This means 1 glass a day for nonpregnant women and 2 glasses a day for men. A glass of wine equals 5 oz (150 mL). ?What foods should I eat? ?Fruits ?Apples. Apricots. Avocado. Berries. Bananas. Cherries. Dates.   Figs. Grapes. Lemons. Melon. Oranges. Peaches. Plums. Pomegranate. ?Vegetables ?Artichokes. Beets. Broccoli. Cabbage. Carrots. Eggplant. Green beans. Chard. Kale. Spinach. Onions. Leeks. Peas. Squash. Tomatoes. Peppers. Radishes. ?Grains ?Whole-grain pasta. Brown  rice. Bulgur wheat. Polenta. Couscous. Whole-wheat bread. Oatmeal. Quinoa. ?Meats and other proteins ?Beans. Almonds. Sunflower seeds. Pine nuts. Peanuts. Cod. Salmon. Scallops. Shrimp. Tuna. Tilapia. Clams. Oysters. Eggs. Poultry without skin. ?Dairy ?Low-fat milk. Cheese. Greek yogurt. ?Fats and oils ?Extra-virgin olive oil. Avocado oil. Grapeseed oil. ?Beverages ?Water. Red wine. Herbal tea. ?Sweets and desserts ?Greek yogurt with honey. Baked apples. Poached pears. Trail mix. ?Seasonings and condiments ?Basil. Cilantro. Coriander. Cumin. Mint. Parsley. Sage. Rosemary. Tarragon. Garlic. Oregano. Thyme. Pepper. Balsamic vinegar. Tahini. Hummus. Tomato sauce. Olives. Mushrooms. ?The items listed above may not be a complete list of foods and beverages you can eat. Contact a dietitian for more information. ?What foods should I limit? ?This is a list of foods that should be eaten rarely or only on special occasions. ?Fruits ?Fruit canned in syrup. ?Vegetables ?Deep-fried potatoes (french fries). ?Grains ?Prepackaged pasta or rice dishes. Prepackaged cereal with added sugar. Prepackaged snacks with added sugar. ?Meats and other proteins ?Beef. Pork. Lamb. Poultry with skin. Hot dogs. Bacon. ?Dairy ?Ice cream. Sour cream. Whole milk. ?Fats and oils ?Butter. Canola oil. Vegetable oil. Beef fat (tallow). Lard. ?Beverages ?Juice. Sugar-sweetened soft drinks. Beer. Liquor and spirits. ?Sweets and desserts ?Cookies. Cakes. Pies. Candy. ?Seasonings and condiments ?Mayonnaise. Pre-made sauces and marinades. ?The items listed above may not be a complete list of foods and beverages you should limit. Contact a dietitian for more information. ?Summary ?The Mediterranean diet includes both food and lifestyle choices. ?Eat a variety of fresh fruits and vegetables, beans, nuts, seeds, and whole grains. ?Limit the amount of red meat and sweets that you eat. ?If recommended by your health care provider, drink red wine in moderation.  This means 1 glass a day for nonpregnant women and 2 glasses a day for men. A glass of wine equals 5 oz (150 mL). ?This information is not intended to replace advice given to you by your health care provider. Make sure you discuss any questions you have with your health care provider. ?Document Revised: 07/07/2019 Document Reviewed: 05/04/2019 ?Elsevier Patient Education ? 2023 Elsevier Inc. ? ?

## 2022-05-25 NOTE — Progress Notes (Signed)
Gynecology Annual Exam  PCP: [name], MD  Chief Complaint:  Chief Complaint  Patient presents with   Annual Exam    History of Present Illness: Patient is a 41 y.o. G2P0011 presents for annual exam. The patient has no complaints today. She has new diagnoses of sleep apnea and insulin resistance. She has started taking Rybelsus to treat blood sugar and plans to get a CPAP.  LMP: Patient's last menstrual period was 05/21/2022. Average Interval: irregular,  every 1 to 2 months   Duration of flow:  3-5  days Heavy Menses: starts heavy Clots: no Intermenstrual Bleeding: no Postcoital Bleeding: no Dysmenorrhea: no   The patient is rarely sexually active. She currently uses none for contraception. She admits to vaginal dryness.  The patient does perform self breast exams.  There is no notable family history of breast or ovarian cancer in her family.  The patient wears seatbelts: yes.   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 She tries to eat healthy.    The patient denies current symptoms of depression. She reports current dose of vraylar is working.  Review of Systems: Review of Systems  Constitutional:  Negative for chills and fever.  HENT:  Negative for congestion, ear discharge, ear pain, hearing loss, sinus pain and sore throat.   Eyes:  Negative for blurred vision and double vision.  Respiratory:  Negative for cough, shortness of breath and wheezing.        Positive for sleep apnea  Cardiovascular:  Negative for chest pain, palpitations and leg swelling.  Gastrointestinal:  Negative for abdominal pain, blood in stool, constipation, diarrhea, heartburn, melena, nausea and vomiting.  Genitourinary:  Negative for dysuria, flank pain, frequency, hematuria and urgency.  Musculoskeletal:  Positive for myalgias. Negative for back pain and joint pain.  Skin:  Negative for itching and rash.   Neurological:  Negative for dizziness, tingling, tremors, sensory change, speech change, focal weakness, seizures, loss of consciousness, weakness and headaches.  Endo/Heme/Allergies:  Negative for environmental allergies. Does not bruise/bleed easily.  Psychiatric/Behavioral:  Negative for depression, hallucinations, memory loss, substance abuse and suicidal ideas. The patient is not nervous/anxious and does not have insomnia.     Past Medical History:  Patient Active Problem List   Diagnosis Date Noted   Snoring 04/21/2022   Morbid obesity ([place]) 10/01/2021   Cervical disc disease 03/07/2020   Irritable bowel syndrome (IBS) 09/26/2019   Mood disorder ([place]) 08/05/2018   Papanicolaou smear of cervix with positive high risk human papilloma virus (HPV) test 05/04/2018   False positive ana 04/28/2018   Pain in both feet 04/28/2018   Facial rash 04/07/2018    SUPERFICIAL FACIAL RASH / ROSACEA/MALAR RASH?    Chronic fatigue 04/07/2018   Chronic neck pain 03/27/2016   Myalgia 03/27/2016   Left-sided headache 03/27/2016   Family history of early CAD 03/15/2015   History of colon polyps 06/30/2010    Annotation: one was precancerous.. recommending colonoscopy every 5 years. Qualifier: Diagnosis of  By: Diona Browner MD, Amy      History of anemia 06/30/2010         Depression, major, in remission ([place]) 06/30/2010    Qualifier: History of  By: Diona Browner MD, Amy      Endometriosis 06/30/2010         CARDIAC MURMUR 06/30/2010    Qualifier: Diagnosis of  By: Diona Browner MD, Amy  History of chicken pox 06/30/2010          Past Surgical History:  Past Surgical History:  Procedure Laterality Date   COLONOSCOPY WITH PROPOFOL N/A 05/16/2018   Procedure: COLONOSCOPY WITH PROPOFOL;  Surgeon: [name], MD;  Location: [facility];  Service: Endoscopy;  Laterality: N/A;   DILATION AND CURETTAGE OF UTERUS     LAPAROSCOPIC ENDOMETRIOSIS FULGURATION  10-1999   endometriosis    NECK SURGERY N/A 06/14/15   due to C5/6 C6-7 disc herniation    open heart surgery  12-1994   asd repair    Gynecologic History:  Patient's last menstrual period was 05/21/2022. Contraception: none Last Pap: Results were: 05/21/21  LGSIL/negative HR HPV   Last mammogram: 05/18/22 Results were: BI-RAD I  Obstetric History: G2P0011  Family History:  Family History  Problem Relation Age of Onset   Cancer Mother 73       non hodgkins lymphoma(mets to brain)   Coronary artery disease Father    Hypertension Father    Hyperlipidemia Father    Diabetes Father    Diabetes Maternal Grandmother    Heart attack Maternal Grandfather    Other Maternal Grandfather        Deterioration of Jaw   Stroke Paternal Grandmother    Stroke Paternal Grandfather    Healthy Son     Social History:  Social History   Socioeconomic History   Marital status: Married    Spouse name: [name]   Number of children: 1   Years of education: Not on file   Highest education level: Not on file  Occupational History   Occupation: Heritage manager    Employer: [employer]  Tobacco Use   Smoking status: Never   Smokeless tobacco: Never  Vaping Use   Vaping Use: Never used  Substance and Sexual Activity   Alcohol use: Not Currently   Drug use: No   Sexual activity: Yes    Partners: Male    Birth control/protection: None  Other Topics Concern   Not on file  Social History Narrative   Working 3 jobs currently   Social Determinants of Radio broadcast assistant Strain: [place]  (03/03/2022)   Overall Financial Resource Strain (CARDIA)    Difficulty of Paying Living Expenses: Not very hard  Food Insecurity: No Food Insecurity (03/03/2022)   Hunger Vital Sign    Worried About Running Out of Food in the Last Year: Never true    [place] in the Last Year: Never true  Transportation Needs: No Transportation Needs (03/03/2022)   PRAPARE - Hydrologist  (Medical): No    Lack of Transportation (Non-Medical): No  Physical Activity: Inactive (06/25/2020)   Exercise Vital Sign    Days of Exercise per Week: 0 days    Minutes of Exercise per Session: 0 min  Stress: No Stress Concern Present (03/03/2022)   [place]    Feeling of Stress : Not at all  Social Connections: Socially Isolated (06/25/2020)   Social Connection and Isolation Panel [NHANES]    Frequency of Communication with Friends and Family: Once a week    Frequency of Social Gatherings with Friends and Family: Once a week    Attends Religious Services: Never    Marine scientist or Organizations: No    Attends Archivist Meetings: Never    Marital Status: Married  Intimate  Partner Violence: Not At Risk (03/03/2022)   Humiliation, Afraid, Rape, and Kick questionnaire    Fear of Current or Ex-Partner: No    Emotionally Abused: No    Physically Abused: No    Sexually Abused: No    Allergies:  No Known Allergies  Medications: Prior to Admission medications   Medication Sig Start Date End Date Taking? Authorizing Provider  Cyanocobalamin (B-12) 1000 MCG SUBL Place 1 tablet under the tongue daily. 12/21/18   Alba Cory, MD  meloxicam (MOBIC) 15 MG tablet Take 1 tablet (15 mg total) by mouth daily. 10/01/21   Alba Cory, MD  modafinil (PROVIGIL) 100 MG tablet TAKE 1 TABLET(100 MG) BY MOUTH DAILY 04/06/22   Alba Cory, MD  pregabalin (LYRICA) 150 MG capsule Take 1 capsule (150 mg total) by mouth 2 (two) times daily. 12/31/21   Sowles, Danna Hefty, MD  Semaglutide (RYBELSUS) 7 MG TABS Take 7 mg by mouth daily. 12/31/21   Alba Cory, MD  TURMERIC PO Take by mouth daily. Patient not taking: Reported on 05/22/2022    [provider]  Vitamin D, Ergocalciferol, (DRISDOL) 1.25 MG (50000 UNIT) CAPS capsule Take 1 capsule (50,000 Units total) by mouth every 7 (seven) days. 10/01/21   Alba Cory, MD  VRAYLAR 1.5 MG capsule TAKE ONE CAPSULE BY MOUTH DAILY 03/31/22   Alba Cory, MD    Physical Exam Vitals: Blood pressure 136/81, pulse 86, height 5\' 4"  (1.626 m), weight 197 lb (89.4 kg), last menstrual period 05/21/2022.  General: NAD HEENT: normocephalic, anicteric Thyroid: no enlargement, no palpable nodules Pulmonary: No increased work of breathing, CTAB Cardiovascular: RRR, distal pulses 2+ Breast: Breast symmetrical, no tenderness, no palpable nodules or masses, no skin or nipple retraction present, no nipple discharge.  No axillary or supraclavicular lymphadenopathy. Abdomen: NABS, soft, non-tender, non-distended.  Umbilicus without lesions.  No hepatomegaly, splenomegaly or masses palpable. No evidence of hernia  Genitourinary:  External: Normal external female genitalia.  Normal urethral meatus, normal Bartholin's and Skene's glands.    Vagina: Normal vaginal mucosa, no evidence of prolapse.    Cervix: Grossly normal in appearance, no bleeding Extremities: no edema, erythema, or tenderness Neurologic: Grossly intact Psychiatric: mood appropriate, affect full   Assessment: 41 y.o. G2P0011 routine annual exam  Plan: Problem List Items Addressed This Visit   None Visit Diagnoses     Well woman exam with routine gynecological exam    -  Primary   Relevant Orders   Cytology - PAP   Need for immunization against influenza       Relevant Orders   Flu Vaccine QUAD 15mo+IM (Fluarix, Fluzone & Alfiuria Quad PF) (Completed)   Screening for cervical cancer       Relevant Orders   Cytology - PAP       1) Mammogram - recommend yearly screening mammogram.  Mammogram Is up to date   2) STI screening  was offered and declined  3) ASCCP guidelines and rationale discussed.  Patient opts for every 3 years screening interval  4) Contraception - the patient is currently using  none.  She is happy with her current form of contraception and plans to continue  5)  Colonoscopy -- Screening recommended starting at age 84 for average risk individuals, age 50 for individuals deemed at increased risk (including African Americans) and recommended to continue until age 74.  For patient age 84-85 individualized approach is recommended.  Gold standard screening is via colonoscopy, Cologuard screening is an acceptable alternative for  patient unwilling or unable to undergo colonoscopy.  "Colorectal cancer screening for average?risk adults: 2018 guideline update from the [employer]: A Cancer Journal for Clinicians: Nov 11, 2016   6) Routine healthcare maintenance including cholesterol, diabetes screening discussed Declines  7) Return in about 1 year (around 05/26/2023) for annual established gyn.   Rod Can, [facility] 05/25/2022, 2:31 PM

## 2022-05-26 DIAGNOSIS — G4733 Obstructive sleep apnea (adult) (pediatric): Secondary | ICD-10-CM | POA: Diagnosis not present

## 2022-06-01 ENCOUNTER — Encounter: Payer: Self-pay | Admitting: Advanced Practice Midwife

## 2022-06-01 ENCOUNTER — Ambulatory Visit (INDEPENDENT_AMBULATORY_CARE_PROVIDER_SITE_OTHER): Payer: BC Managed Care – PPO | Admitting: Urology

## 2022-06-01 VITALS — BP 138/81 | HR 89 | Wt 197.0 lb

## 2022-06-01 DIAGNOSIS — N3946 Mixed incontinence: Secondary | ICD-10-CM

## 2022-06-01 LAB — URINALYSIS, COMPLETE
Bilirubin, UA: NEGATIVE
Glucose, UA: NEGATIVE
Ketones, UA: NEGATIVE
Leukocytes,UA: NEGATIVE
Nitrite, UA: NEGATIVE
Protein,UA: NEGATIVE
RBC, UA: NEGATIVE
Specific Gravity, UA: 1.02 (ref 1.005–1.030)
Urobilinogen, Ur: 0.2 mg/dL (ref 0.2–1.0)
pH, UA: 7 (ref 5.0–7.5)

## 2022-06-01 LAB — MICROSCOPIC EXAMINATION

## 2022-06-01 MED ORDER — GEMTESA 75 MG PO TABS: 1.0000 | ORAL_TABLET | Freq: Every day | ORAL | 11 refills | Status: DC

## 2022-06-01 NOTE — Progress Notes (Signed)
06/01/2022 9:21 AM   Deborah Navarro March 15, 1981 [id number]  Referring provider: Alba Cory, MD [address] [place],  [place] [address]  No chief complaint on file.   HPI: I was consulted to assess the patient's frequency and mild urinary incontinence.  She voids every 1-2 hours.  She can have urge incontinence and stress incontinence.  Does not wear a pad.  Has had rare bedwetting.  Flow was good.     Grade 2 hypermobility the bladder neck and negative cough test.  Good spring back affect.  No prolapse   Patient has mild mixed incontinence. stress incontinence worse but both are mild    Reassess in 6 weeks on Myrbetriq 50 mg samples and prescription.  She did not want physical therapy.     Myrbetriq helped the nocturia but still has mild mixed incontinence.    Nighttime frequency improved on Vesicare but still has mild mixed incontinence.  Failed oxybutynin.  Clinically not infected.  She probably will stay on it for now and we will order urodynamics.   During urodynamics she did not void and was catheterized for 200 mL.  Maximum bladder capacity was 490 mL.  Bladder was stable.  She did not leak with a Valsalva pressure of 172 cm of water.  She could not void in the lab setting.  There was subtraction artifact I do not think the patient actually generated detrusor contraction.  EMG activity was within normal limits.  Bladder neck descent at 1 cm.  Spinal hardware noted the details of the urodynamics are signed and dictated     Patient has mild mixed incontinence.  She has very mild stress incontinence and does not shown any objective evidence.  In my opinion based upon the findings a sling would not be a good option for her.  If she does have bladder a contractility she may be also at higher risk of retention.  A bulking agent would be recommended.   Patient is nocturia much better and she is pleased.  She is voiding every 1-2 hours.  If she has a hard cough or  sneeze she will leak a small amount.  She agrees no surgery for this.  Main symptom is the frequency and secondarily very mild stress incontinence but she is overall much happier because she is sleeping through the night better  Reassess in 8 weeks on Vesicare 10 mg.  Dosage increase.  She understands if I have samples I can give her the new beta 3 agonist on that visit.  I did not mention bulking agent.  We agreed not to do surgery   Today Continence dramatically better.  Frequency stable.  No infections.  Doing well on Vesicare 10 mg  Today Patient stopped Vesicare due to constant patient.  Voids every during the day.  Gets up once or twice a night.  Rare stress and urge incontinence.  Does not wear a pad      PMH: Past Medical History:  Diagnosis Date   Anemia, unspecified    ASD (atrial septal defect)    repaired 1996   Blood transfusion, without reported diagnosis    Depressive disorder, not elsewhere classified    Elevated blood pressure reading without diagnosis of hypertension    Endometriosis of fallopian tube    Family history of adverse reaction to anesthesia    Father - PONV   Irritable bowel syndrome    PONV (postoperative nausea and vomiting)    Streptococcal meningitis  Undiagnosed cardiac murmurs    Unspecified personal history presenting hazards to health    Wears contact lenses     Surgical History: Past Surgical History:  Procedure Laterality Date   COLONOSCOPY WITH PROPOFOL N/A 05/16/2018   Procedure: COLONOSCOPY WITH PROPOFOL;  Surgeon: Midge Minium, MD;  Location: [facility];  Service: Endoscopy;  Laterality: N/A;   DILATION AND CURETTAGE OF UTERUS     LAPAROSCOPIC ENDOMETRIOSIS FULGURATION  10-1999   endometriosis   NECK SURGERY N/A 06/14/15   due to C5/6 C6-7 disc herniation    open heart surgery  12-1994   asd repair    Home Medications:  Allergies as of 06/01/2022   No Known Allergies      Medication List        Accurate as of  June 01, 2022  9:21 AM. If you have any questions, ask your nurse or doctor.          B-12 1000 MCG Subl Place 1 tablet under the tongue daily.   meloxicam 15 MG tablet Commonly known as: MOBIC Take 1 tablet (15 mg total) by mouth daily.   modafinil 100 MG tablet Commonly known as: PROVIGIL TAKE 1 TABLET(100 MG) BY MOUTH DAILY   pregabalin 150 MG capsule Commonly known as: LYRICA Take 1 capsule (150 mg total) by mouth 2 (two) times daily.   Rybelsus 7 MG Tabs Generic drug: Semaglutide Take 7 mg by mouth daily.   TURMERIC PO Take by mouth daily.   Vitamin D (Ergocalciferol) 1.25 MG (50000 UNIT) Caps capsule Commonly known as: DRISDOL Take 1 capsule (50,000 Units total) by mouth every 7 (seven) days.   Vraylar 1.5 MG capsule Generic drug: cariprazine TAKE ONE CAPSULE BY MOUTH DAILY        Allergies: No Known Allergies  Family History: Family History  Problem Relation Age of Onset   Cancer Mother 21       non hodgkins lymphoma(mets to brain)   Coronary artery disease Father    Hypertension Father    Hyperlipidemia Father    Diabetes Father    Diabetes Maternal Grandmother    Heart attack Maternal Grandfather    Other Maternal Grandfather        Deterioration of Jaw   Stroke Paternal Grandmother    Stroke Paternal Grandfather    Healthy Son     Social History:  reports that she has never smoked. She has never used smokeless tobacco. She reports that she does not currently use alcohol. She reports that she does not use drugs.  ROS:                                        Physical Exam: LMP 05/21/2022   Constitutional:  Alert and oriented, No acute distress. HEENT: [place] AT, moist mucus membranes.  Trachea midline, no masses.  Laboratory Data: Lab Results  Component Value Date   WBC 6.2 04/23/2022   HGB 11.6 (L) 04/23/2022   HCT 34.3 (L) 04/23/2022   MCV 87.1 04/23/2022   PLT 273 04/23/2022    Lab Results  Component  Value Date   CREATININE 0.94 04/23/2022    No results found for: "PSA"  No results found for: "TESTOSTERONE"  Lab Results  Component Value Date   HGBA1C 5.7 (H) 03/25/2021    Urinalysis    Component Value Date/Time   COLORURINE YELLOW 09/07/2019 1055  APPEARANCEUR Cloudy (A) 10/28/2020 1309   LABSPEC 1.009 09/07/2019 1055   PHURINE 8.0 09/07/2019 1055   GLUCOSEU Negative 10/28/2020 1309   HGBUR NEGATIVE 09/07/2019 1055   BILIRUBINUR negative 04/23/2022 1305   BILIRUBINUR Negative 10/28/2020 1309   KETONESUR NEGATIVE 09/07/2019 1055   PROTEINUR Positive (A) 04/23/2022 1305   PROTEINUR Negative 10/28/2020 1309   PROTEINUR NEGATIVE 09/07/2019 1055   UROBILINOGEN 0.2 04/23/2022 1305   UROBILINOGEN 0.2 02/28/2014 2052   NITRITE negtaive 04/23/2022 1305   NITRITE Negative 10/28/2020 1309   NITRITE NEGATIVE 09/07/2019 1055   LEUKOCYTESUR Negative 04/23/2022 1305   LEUKOCYTESUR Negative 10/28/2020 1309   LEUKOCYTESUR NEGATIVE 09/07/2019 1055    Pertinent Imaging:   Assessment & Plan: Patient has failed 3 medications.  She has mild symptoms.  Role of physical therapy and Leslye Peer discussed Physical therapy difficult because of travel and work.  Gave her Gemtesa samples and prescription.  If it does not work she will choose watchful waiting and call if she wants physical therapy.  If it works I will see her in 1 year on Gemtesa  1. Mixed incontinence    No follow-ups on file.  Martina Sinner, MD  [facility], [facility] [address], [address] [place], [place] [address] [phone]

## 2022-06-03 LAB — CYTOLOGY - PAP
Adequacy: ABSENT
Comment: NEGATIVE
Diagnosis: NEGATIVE
High risk HPV: NEGATIVE

## 2022-06-16 ENCOUNTER — Ambulatory Visit: Payer: Self-pay | Admitting: *Deleted

## 2022-06-16 NOTE — Patient Outreach (Signed)
  Care Coordination   Follow Up Visit Note   06/16/2022 Name: [name] MRN: [id number] DOB: 1981-03-01  Deborah Navarro is a 42 y.o. year old female who sees [name], MD for primary care. I spoke with  [name] by phone today.  What matters to the patients health and wellness today?  Obtained CPAP and now taking Myrbetriq for incontinence.     Goals Addressed             This Visit's Progress    RNCM: OSA   On track    Care Coordination Interventions: Evaluation of current treatment plan related to OSA and patient's adherence to plan as established by provider Advised patient to Call pulmonology and urology office to schedule follow up appointments Reviewed scheduled/upcoming provider appointments including 07-01-2022 with PCP.  Due for follow up with pulmonology and urology both in Feb.. Discussed plans with patient for ongoing care management follow up and provided patient with direct contact information for care management team Advised patient to discuss changes in OSA and other chronic conditions with provider The patient agrees to work with the [facility] in the care coordination program.The goals of the program reviewed with the patient and the patient is receptive to [name]. The patient has the [facility] number to call for needs between [place].   Completed sleep study, using CPAP for the last 2 weeks.  State she feels more rested immediately upon waking but is still sleepy about 5-6 hours after being awake.  She will continue to use.           SDOH assessments and interventions completed:  No     Care Coordination Interventions:  Yes, provided   Follow up plan: Follow up call scheduled for 2/29    Encounter Outcome:  Pt. Visit Completed   Valente David, RN, MSN, [place] Care Management Care Management Coordinator [phone]

## 2022-06-26 DIAGNOSIS — G4733 Obstructive sleep apnea (adult) (pediatric): Secondary | ICD-10-CM | POA: Diagnosis not present

## 2022-06-30 NOTE — Progress Notes (Signed)
Name: Deborah Navarro   MRN: [id number]    DOB: April 01, 1981   Date:07/01/2022       Progress Note  Subjective  Chief Complaint  Follow Up  HPI  Feet Pain: she states pain has been going since 2021 she states started with pain on forefoot, goes from toes to ball of her feet, currently worse on left foot. She states burning like, sometimes aching, sometimes it gets red at the end of the day, she also has intermittent swelling that is worse in the morning. She is only taking Meloxicam a few times a week now  OSA: she started on CPAP about 4 weeks ago auto pap 4-20 cmH2O, she showed me her app and compliance is 100 % , event per hour down to 0.9. She states she still needs to take Modafinil to stay awake but in general feeling better when she first gets up in the morning, also having less morning headaches.    FMS: she was taking Lyrica  200 mg TID, she was gaining a lot of weight and is down to 150 mg BID, she also takes Meloxicam prn , pain level on medication is 1-2/10 however was off Lyrica for a couple of days and it went up to 5-6/10  She has history of elevated ANA , sed rate and CRP and sed rate. She was seen by Dr. Nonie Hoyer and had more labs done, she has OA hands and positive ANA and at this time to try tumeric, ginger root daily. She continues to have discomfort under her breast but now also on inner thighs , periumbilical and also neck area - she has been seen by neurologist in 2017 and had MRI c-spine and brain that did not show MS. She states those symptoms have not been present since Summer 2023    Mood Disorder: .Insurance denied Dietitian initially , she has tried Abilify, Elavil  and Duloxetine but did not work well for her. We did a PA and she has been on Vraylar 1.5 mg since 2021 and also modafinil for daytime fatigue and seems to be working for mood. 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 She is excited, states has  intermittent down days but feels well   History of c-spine surgery: Dec 2016, she has dull aches on neck but denies radiculitis.  Unchanged   IBS:she tried trulance but caused diarrhea and stopped medication, bowel movements are almost daily now and is feeling better   Urge incontinence: under the care of Dr. Matilde Sprang and had a uro-dynamic study this morning. She was on Vesicar and initially felt like it was helping with nocturia but since it stopped working and it was causing constipation , she states constipation resolved after she stopped Vesicare She just started on Gemtesa and seems to be helping with symptoms   Obesity BMI above 35 with co-morbidities, she has OA and pain on feet, neuropathy, dyslipidemia, we started her on Rybelsus Fall 2022 and she had lost 31 lbs since started medication October 2022 , only able to tolerate 7 mg dose, she has skipped some of the doses and weight is trending up .   Pre-diabetes: she has insulin resistance, A1C of 5.7 % back in 10/22, she is taking Rybelsus and has early satiety and on higher dose she is feeling more nauseated, she is back on 7 mg , we will recheck labs   Patient Active Problem List   Diagnosis  Date Noted   Snoring 04/21/2022   Morbid obesity (HCC) 10/01/2021   Cervical disc disease 03/07/2020   Irritable bowel syndrome (IBS) 09/26/2019   Mood disorder (HCC) 08/05/2018   Papanicolaou smear of cervix with positive high risk human papilloma virus (HPV) test 05/04/2018   False positive ana 04/28/2018   Pain in both feet 04/28/2018   Facial rash 04/07/2018   Chronic fatigue 04/07/2018   Chronic neck pain 03/27/2016   Myalgia 03/27/2016   Left-sided headache 03/27/2016   Family history of early CAD 03/15/2015   History of colon polyps 06/30/2010   History of anemia 06/30/2010   Depression, major, in remission (HCC) 06/30/2010   Endometriosis 06/30/2010   CARDIAC MURMUR 06/30/2010   History of chicken pox 06/30/2010    Past  Surgical History:  Procedure Laterality Date   COLONOSCOPY WITH PROPOFOL N/A 05/16/2018   Procedure: COLONOSCOPY WITH PROPOFOL;  Surgeon: Midge Minium, MD;  [facility];  Service: Endoscopy;  Laterality: N/A;   DILATION AND CURETTAGE OF UTERUS     LAPAROSCOPIC ENDOMETRIOSIS FULGURATION  10-1999   endometriosis   NECK SURGERY N/A 06/14/15   due to C5/6 C6-7 disc herniation    open heart surgery  12-1994   asd repair    Family History  Problem Relation Age of Onset   Cancer Mother 50       non hodgkins lymphoma(mets to brain)   Coronary artery disease Father    Hypertension Father    Hyperlipidemia Father    Diabetes Father    Diabetes Maternal Grandmother    Heart attack Maternal Grandfather    Other Maternal Grandfather        Deterioration of Jaw   Stroke Paternal Grandmother    Stroke Paternal Grandfather    Healthy Son     Social History   Tobacco Use   Smoking status: Never   Smokeless tobacco: Never  Substance Use Topics   Alcohol use: Not Currently     Current Outpatient Medications:    Cyanocobalamin (B-12) 1000 MCG SUBL, Place 1 tablet under the tongue daily., Disp: 30 tablet, Rfl: 0   meloxicam (MOBIC) 15 MG tablet, Take 1 tablet (15 mg total) by mouth daily., Disp: 90 tablet, Rfl: 1   modafinil (PROVIGIL) 100 MG tablet, TAKE 1 TABLET(100 MG) BY MOUTH DAILY, Disp: 90 tablet, Rfl: 0   pregabalin (LYRICA) 150 MG capsule, Take 1 capsule (150 mg total) by mouth 2 (two) times daily., Disp: 180 capsule, Rfl: 1   Semaglutide (RYBELSUS) 7 MG TABS, Take 7 mg by mouth daily., Disp: 90 tablet, Rfl: 1   TURMERIC PO, Take by mouth daily., Disp: , Rfl:    Vibegron (GEMTESA) 75 MG TABS, Take 1 tablet by mouth daily., Disp: 30 tablet, Rfl: 11   Vitamin D, Ergocalciferol, (DRISDOL) 1.25 MG (50000 UNIT) CAPS capsule, Take 1 capsule (50,000 Units total) by mouth every 7 (seven) days., Disp: 12 capsule, Rfl: 1   VRAYLAR 1.5 MG capsule, TAKE ONE CAPSULE BY MOUTH  DAILY, Disp: 90 capsule, Rfl: 0  No Known Allergies  I personally reviewed active problem list, medication list, allergies, family history, social history, health maintenance with the patient/caregiver today.   ROS  Constitutional: Negative for fever, positive for weight change.  Respiratory: Negative for cough and shortness of breath.   Cardiovascular: Negative for chest pain or palpitations.  Gastrointestinal: Negative for abdominal pain, no bowel changes.  Musculoskeletal: Negative for gait problem or joint swelling.  Skin: Negative for  rash.  Neurological: Negative for dizziness or headache.  No other specific complaints in a complete review of systems (except as listed in HPI above).   Objective  Vitals:   07/01/22 1002  BP: 126/84  Pulse: 90  Resp: 16  SpO2: 99%  Weight: 203 lb (92.1 kg)  Height: 5\' 4"  (1.626 m)    Body mass index is 34.84 kg/m.  Physical Exam  Constitutional: Patient appears well-developed and well-nourished. Obese No distress.  HEENT: head atraumatic, normocephalic, pupils equal and reactive to light, neck supple Cardiovascular: Normal rate, regular rhythm and normal heart sounds.  No murmur heard. No BLE edema. Pulmonary/Chest: Effort normal and breath sounds normal. No respiratory distress. Abdominal: Soft.  There is no tenderness. Psychiatric: Patient has a normal mood and affect. behavior is normal. Judgment and thought content normal.   Recent Results (from the past 2160 hour(s))  POCT urinalysis dipstick     Status: Abnormal   Collection Time: 04/23/22  1:05 PM  Result Value Ref Range   Color, UA gold    Clarity, UA clear    Glucose, UA Negative Negative   Bilirubin, UA negative    Ketones, UA negative    Spec Grav, UA 1.020 1.010 - 1.025   Blood, UA small    pH, UA 5.0 5.0 - 8.0   Protein, UA Positive (A) Negative   Urobilinogen, UA 0.2 0.2 or 1.0 E.U./dL   Nitrite, UA negtaive    Leukocytes, UA Negative Negative   Appearance  clear    Odor none   POCT urine pregnancy     Status: None   Collection Time: 04/23/22  1:31 PM  Result Value Ref Range   Preg Test, Ur Negative Negative  Comprehensive metabolic panel     Status: Abnormal   Collection Time: 04/23/22  2:01 PM  Result Value Ref Range   Sodium 138 135 - 145 mmol/L   Potassium 4.1 3.5 - 5.1 mmol/L   Chloride 105 98 - 111 mmol/L   CO2 25 22 - 32 mmol/L   Glucose, Bld 91 70 - 99 mg/dL    Comment: Glucose reference range applies only to samples taken after fasting for at least 8 hours.   BUN 13 6 - 20 mg/dL   Creatinine, Ser 0.94 0.44 - 1.00 mg/dL   Calcium 8.7 (L) 8.9 - 10.3 mg/dL   Total Protein 7.5 6.5 - 8.1 g/dL   Albumin 3.9 3.5 - 5.0 g/dL   AST 18 15 - 41 U/L   ALT 14 0 - 44 U/L   Alkaline Phosphatase 63 38 - 126 U/L   Total Bilirubin 0.6 0.3 - 1.2 mg/dL   GFR, Estimated >60 >60 mL/min    Comment: (NOTE) Calculated using the CKD-EPI Creatinine Equation (2021)    Anion gap 8 5 - 15    Comment: Performed at [facility], [address] [place],  [id number]  CBC with Differential/Platelet     Status: Abnormal   Collection Time: 04/23/22  2:01 PM  Result Value Ref Range   WBC 6.2 4.0 - 10.5 K/uL   RBC 3.94 3.87 - 5.11 MIL/uL   Hemoglobin 11.6 (L) 12.0 - 15.0 g/dL   HCT 34.3 (L) 36.0 - 46.0 %   MCV 87.1 80.0 - 100.0 fL   MCH 29.4 26.0 - 34.0 pg   MCHC 33.8 30.0 - 36.0 g/dL   RDW 12.6 11.5 - 15.5 %   Platelets 273 150 - 400 K/uL   nRBC  0.0 0.0 - 0.2 %   Neutrophils Relative % 43 %   Neutro Abs 2.7 1.7 - 7.7 K/uL   Lymphocytes Relative 46 %   Lymphs Abs 2.8 0.7 - 4.0 K/uL   Monocytes Relative 9 %   Monocytes Absolute 0.6 0.1 - 1.0 K/uL   Eosinophils Relative 1 %   Eosinophils Absolute 0.1 0.0 - 0.5 K/uL   Basophils Relative 1 %   Basophils Absolute 0.1 0.0 - 0.1 K/uL   Immature Granulocytes 0 %   Abs Immature Granulocytes 0.01 0.00 - 0.07 K/uL    Comment: Performed at [facility], [address]  [place], [place] [address]  Cytology - PAP     Status: None   Collection Time: 05/25/22  2:30 PM  Result Value Ref Range   High risk HPV Negative    Adequacy      Satisfactory for evaluation; transformation zone component ABSENT.   Diagnosis      - Negative for intraepithelial lesion or malignancy (NILM)   Microorganisms Shift in flora suggestive of bacterial vaginosis    Comment Normal Reference Range HPV - Negative   Urinalysis, Complete     Status: None   Collection Time: 06/01/22  9:24 AM  Result Value Ref Range   Specific Gravity, UA 1.020 1.005 - 1.030   pH, UA 7.0 5.0 - 7.5   Color, UA Yellow Yellow   Appearance Ur Clear Clear   Leukocytes,UA Negative Negative   Protein,UA Negative Negative/Trace   Glucose, UA Negative Negative   Ketones, UA Negative Negative   RBC, UA Negative Negative   Bilirubin, UA Negative Negative   Urobilinogen, Ur 0.2 0.2 - 1.0 mg/dL   Nitrite, UA Negative Negative   Microscopic Examination See below:   Microscopic Examination     Status: None   Collection Time: 06/01/22  9:24 AM   Urine  Result Value Ref Range   WBC, UA 0-5 0 - 5 /hpf   RBC, Urine 0-2 0 - 2 /hpf   Epithelial Cells (non renal) 0-10 0 - 10 /hpf   Bacteria, UA Few None seen/Few    PHQ2/9:    07/01/2022   10:01 AM 04/23/2022   12:59 PM 12/31/2021    9:31 AM 10/01/2021    8:02 AM 08/12/2021    9:45 AM  Depression screen PHQ 2/9  Decreased Interest 1 0 0 0 0  Down, Depressed, Hopeless 1 0 0 0 0  PHQ - 2 Score 2 0 0 0 0  Altered sleeping 0  3 0 0  Tired, decreased energy 3  3 3  0  Change in appetite 1  3 3  0  Feeling bad or failure about yourself  0  0 0 0  Trouble concentrating 0  3 0 0  Moving slowly or fidgety/restless 0  0 0 0  Suicidal thoughts 0  0 0 0  PHQ-9 Score 6  12 6  0  Difficult doing work/chores     Not difficult at all    phq 9 is positive   Fall Risk:    07/01/2022   10:01 AM 04/23/2022   12:58 PM 12/31/2021    9:31 AM 10/01/2021    8:02 AM 08/12/2021     9:45 AM  Fall Risk   Falls in the past year? 0 0 0 0 0  Number falls in past yr: 0 0 0 0 0  Injury with Fall? 0 0 0 0 0  Risk for fall due to :  No Fall Risks  No Fall Risks No Fall Risks No Fall Risks  Follow up Falls prevention discussed Falls evaluation completed Falls prevention discussed Falls prevention discussed Falls prevention discussed      Functional Status Survey: Is the patient deaf or have difficulty hearing?: No Does the patient have difficulty seeing, even when wearing glasses/contacts?: No Does the patient have difficulty concentrating, remembering, or making decisions?: No Does the patient have difficulty walking or climbing stairs?: No Does the patient have difficulty dressing or bathing?: No Does the patient have difficulty doing errands alone such as visiting a doctor's office or shopping?: No    Assessment & Plan  1. Mood disorder (HCC)  - modafinil (PROVIGIL) 100 MG tablet; Take 1 tablet (100 mg total) by mouth daily.  Dispense: 90 tablet; Refill: 1 - pregabalin (LYRICA) 150 MG capsule; Take 1 capsule (150 mg total) by mouth 2 (two) times daily.  Dispense: 180 capsule; Refill: 1 - cariprazine (VRAYLAR) 1.5 MG capsule; Take 1 capsule (1.5 mg total) by mouth daily.  Dispense: 90 capsule; Refill: 1  2. Fibromyalgia syndrome  - meloxicam (MOBIC) 15 MG tablet; Take 1 tablet (15 mg total) by mouth daily.  Dispense: 90 tablet; Refill: 1 - pregabalin (LYRICA) 150 MG capsule; Take 1 capsule (150 mg total) by mouth 2 (two) times daily.  Dispense: 180 capsule; Refill: 1  3. Neuropathic pain  - pregabalin (LYRICA) 150 MG capsule; Take 1 capsule (150 mg total) by mouth 2 (two) times daily.  Dispense: 180 capsule; Refill: 1  4. Vitamin D deficiency  - VITAMIN D 25 Hydroxy (Vit-D Deficiency, Fractures) - Vitamin D, Ergocalciferol, (DRISDOL) 1.25 MG (50000 UNIT) CAPS capsule; Take 1 capsule (50,000 Units total) by mouth every 7 (seven) days.  Dispense: 12 capsule;  Refill: 1  5. Dyslipidemia  - Lipid panel  6. B12 deficiency  - CBC with Differential/Platelet - Vitamin B12  7. Pre-diabetes  - Hemoglobin A1c - Semaglutide (RYBELSUS) 7 MG TABS; Take 7 mg by mouth daily.  Dispense: 90 tablet; Refill: 1  8. Pain in both feet  - meloxicam (MOBIC) 15 MG tablet; Take 1 tablet (15 mg total) by mouth daily.  Dispense: 90 tablet; Refill: 1  9. Irritable bowel syndrome with both constipation and diarrhea   10. Positive ANA (antinuclear antibody)  - ANA,IFA RA Diag Pnl w/rflx Tit/Patn - C-reactive protein - Sedimentation rate  11. Long-term use of high-risk medication  - CBC with Differential/Platelet - COMPLETE METABOLIC PANEL WITH GFR

## 2022-07-01 ENCOUNTER — Ambulatory Visit (INDEPENDENT_AMBULATORY_CARE_PROVIDER_SITE_OTHER): Payer: BC Managed Care – PPO | Admitting: Family Medicine

## 2022-07-01 ENCOUNTER — Encounter: Payer: Self-pay | Admitting: Family Medicine

## 2022-07-01 VITALS — BP 126/84 | HR 90 | Resp 16 | Ht 64.0 in | Wt 203.0 lb

## 2022-07-01 DIAGNOSIS — Z79899 Other long term (current) drug therapy: Secondary | ICD-10-CM | POA: Diagnosis not present

## 2022-07-01 DIAGNOSIS — M79671 Pain in right foot: Secondary | ICD-10-CM

## 2022-07-01 DIAGNOSIS — M797 Fibromyalgia: Secondary | ICD-10-CM

## 2022-07-01 DIAGNOSIS — E1169 Type 2 diabetes mellitus with other specified complication: Secondary | ICD-10-CM | POA: Insufficient documentation

## 2022-07-01 DIAGNOSIS — F39 Unspecified mood [affective] disorder: Secondary | ICD-10-CM | POA: Diagnosis not present

## 2022-07-01 DIAGNOSIS — R7303 Prediabetes: Secondary | ICD-10-CM

## 2022-07-01 DIAGNOSIS — M792 Neuralgia and neuritis, unspecified: Secondary | ICD-10-CM

## 2022-07-01 DIAGNOSIS — E538 Deficiency of other specified B group vitamins: Secondary | ICD-10-CM | POA: Diagnosis not present

## 2022-07-01 DIAGNOSIS — E559 Vitamin D deficiency, unspecified: Secondary | ICD-10-CM | POA: Diagnosis not present

## 2022-07-01 DIAGNOSIS — R768 Other specified abnormal immunological findings in serum: Secondary | ICD-10-CM

## 2022-07-01 DIAGNOSIS — K582 Mixed irritable bowel syndrome: Secondary | ICD-10-CM

## 2022-07-01 DIAGNOSIS — M79672 Pain in left foot: Secondary | ICD-10-CM

## 2022-07-01 DIAGNOSIS — R7689 Other specified abnormal immunological findings in serum: Secondary | ICD-10-CM

## 2022-07-01 DIAGNOSIS — E785 Hyperlipidemia, unspecified: Secondary | ICD-10-CM

## 2022-07-01 MED ORDER — VITAMIN D (ERGOCALCIFEROL) 1.25 MG (50000 UNIT) PO CAPS: 50000.0000 [IU] | ORAL_CAPSULE | ORAL | 1 refills | Status: DC

## 2022-07-01 MED ORDER — MODAFINIL 100 MG PO TABS: 100.0000 mg | ORAL_TABLET | Freq: Every day | ORAL | 1 refills | Status: DC

## 2022-07-01 MED ORDER — MELOXICAM 15 MG PO TABS: 15.0000 mg | ORAL_TABLET | Freq: Every day | ORAL | 1 refills | Status: DC

## 2022-07-01 MED ORDER — RYBELSUS 7 MG PO TABS: 7.0000 mg | ORAL_TABLET | Freq: Every day | ORAL | 1 refills | Status: DC

## 2022-07-01 MED ORDER — CARIPRAZINE HCL 1.5 MG PO CAPS: 1.5000 mg | ORAL_CAPSULE | Freq: Every day | ORAL | 1 refills | Status: DC

## 2022-07-01 MED ORDER — PREGABALIN 150 MG PO CAPS: 150.0000 mg | ORAL_CAPSULE | Freq: Two times a day (BID) | ORAL | 1 refills | Status: DC

## 2022-07-03 LAB — HEMOGLOBIN A1C
Hgb A1c MFr Bld: 5.8 % of total Hgb — ABNORMAL HIGH (ref <5.7)
Mean Plasma Glucose: 120 mg/dL
eAG (mmol/L): 6.6 mmol/L

## 2022-07-03 LAB — CBC WITH DIFFERENTIAL/PLATELET
Absolute Monocytes: 402 cells/uL (ref 200–950)
Basophils Absolute: 49 cells/uL (ref 0–200)
Basophils Relative: 1 %
Eosinophils Absolute: 98 cells/uL (ref 15–500)
Eosinophils Relative: 2 %
HCT: 36.4 % (ref 35.0–45.0)
Hemoglobin: 12.3 g/dL (ref 11.7–15.5)
Lymphs Abs: 2166 cells/uL (ref 850–3900)
MCH: 30.2 pg (ref 27.0–33.0)
MCHC: 33.8 g/dL (ref 32.0–36.0)
MCV: 89.4 fL (ref 80.0–100.0)
MPV: 10.4 fL (ref 7.5–12.5)
Monocytes Relative: 8.2 %
Neutro Abs: 2185 cells/uL (ref 1500–7800)
Neutrophils Relative %: 44.6 %
Platelets: 296 10*3/uL (ref 140–400)
RBC: 4.07 10*6/uL (ref 3.80–5.10)
RDW: 12.5 % (ref 11.0–15.0)
Total Lymphocyte: 44.2 %
WBC: 4.9 10*3/uL (ref 3.8–10.8)

## 2022-07-03 LAB — COMPLETE METABOLIC PANEL WITH GFR
AG Ratio: 1.4 (calc) (ref 1.0–2.5)
ALT: 19 U/L (ref 6–29)
AST: 15 U/L (ref 10–30)
Albumin: 4.2 g/dL (ref 3.6–5.1)
Alkaline phosphatase (APISO): 74 U/L (ref 31–125)
BUN: 13 mg/dL (ref 7–25)
CO2: 29 mmol/L (ref 20–32)
Calcium: 9.2 mg/dL (ref 8.6–10.2)
Chloride: 103 mmol/L (ref 98–110)
Creat: 0.85 mg/dL (ref 0.50–0.99)
Globulin: 2.9 g/dL (calc) (ref 1.9–3.7)
Glucose, Bld: 77 mg/dL (ref 65–99)
Potassium: 4.4 mmol/L (ref 3.5–5.3)
Sodium: 139 mmol/L (ref 135–146)
Total Bilirubin: 0.4 mg/dL (ref 0.2–1.2)
Total Protein: 7.1 g/dL (ref 6.1–8.1)
eGFR: 87 mL/min/{1.73_m2} (ref >=60)

## 2022-07-03 LAB — LIPID PANEL
Cholesterol: 203 mg/dL — ABNORMAL HIGH (ref <200)
HDL: 49 mg/dL — ABNORMAL LOW (ref >=50)
LDL Cholesterol (Calc): 135 mg/dL (calc) — ABNORMAL HIGH
Non-HDL Cholesterol (Calc): 154 mg/dL (calc) — ABNORMAL HIGH (ref <130)
Total CHOL/HDL Ratio: 4.1 (calc) (ref <5.0)
Triglycerides: 93 mg/dL (ref <150)

## 2022-07-03 LAB — VITAMIN B12: Vitamin B-12: 666 pg/mL (ref 200–1100)

## 2022-07-03 LAB — C-REACTIVE PROTEIN: CRP: 0.8 mg/L (ref <8.0)

## 2022-07-03 LAB — ANTI-NUCLEAR AB-TITER (ANA TITER): ANA Titer 1: 1:80 {titer} — ABNORMAL HIGH

## 2022-07-03 LAB — ANA,IFA RA DIAG PNL W/RFLX TIT/PATN
Anti Nuclear Antibody (ANA): POSITIVE — AB
Cyclic Citrullin Peptide Ab: <16 UNITS
Rheumatoid fact SerPl-aCnc: <14 IU/mL (ref <14)

## 2022-07-03 LAB — VITAMIN D 25 HYDROXY (VIT D DEFICIENCY, FRACTURES): Vit D, 25-Hydroxy: 26 ng/mL — ABNORMAL LOW (ref 30–100)

## 2022-07-03 LAB — SEDIMENTATION RATE: Sed Rate: 14 mm/h (ref 0–20)

## 2022-07-17 ENCOUNTER — Encounter: Payer: Self-pay | Admitting: Family Medicine

## 2022-07-17 ENCOUNTER — Other Ambulatory Visit: Payer: Self-pay | Admitting: Family Medicine

## 2022-07-17 DIAGNOSIS — F39 Unspecified mood [affective] disorder: Secondary | ICD-10-CM

## 2022-07-27 DIAGNOSIS — G4733 Obstructive sleep apnea (adult) (pediatric): Secondary | ICD-10-CM | POA: Diagnosis not present

## 2022-08-07 ENCOUNTER — Telehealth (INDEPENDENT_AMBULATORY_CARE_PROVIDER_SITE_OTHER): Payer: BC Managed Care – PPO | Admitting: Adult Health

## 2022-08-07 DIAGNOSIS — G4733 Obstructive sleep apnea (adult) (pediatric): Secondary | ICD-10-CM | POA: Diagnosis not present

## 2022-08-07 DIAGNOSIS — G4719 Other hypersomnia: Secondary | ICD-10-CM | POA: Diagnosis not present

## 2022-08-07 NOTE — Patient Instructions (Signed)
Continue on CPAP at bedtime.  Goal is to wear all night long for at least 6 or more hours Keep up great work .  Healthy weight loss  Healthy sleep regimen  Do not drive if sleepy  Follow up in 6  months and As needed

## 2022-08-07 NOTE — Progress Notes (Signed)
Virtual Visit via Video Note  I connected with [place] on 08/07/22 at  3:00 PM EST by a video enabled telemedicine application and verified that I am speaking with the correct person using two identifiers.  Location: Patient: Home  Provider: Office    I discussed the limitations of evaluation and management by telemedicine and the availability of in person appointments. The patient expressed understanding and agreed to proceed.  History of Present Illness: 42 year old female seen for sleep consult April 21, 2022 for snoring, restless sleep and daytime sleepiness found to have severe sleep apnea Today's video visit is a 29-monthfollow-up.  Patient was seen last visit after recent home sleep study that was completed in November that showed very severe sleep apnea.  Patient was started on CPAP.  Patient says she is doing well on CPAP.  Wears it every single night.  Can definitely see a difference in her daytime sleepiness.  Feels that she benefits from CPAP with decreased sleepiness.  Download shows excellent compliance with daily average usage at 8 hours.  Patient is on auto CPAP 5 to 20 cm H2O.  AHI 0.7/hour. Feels more rested when she wakes up . She says she does get tired mid day, remains on Modafinil daily and says this really helps her. She is feeling better since starting on CPAP . Using a nasal mask.    Past Medical History:  Diagnosis Date   Anemia, unspecified    ASD (atrial septal defect)    repaired 1996   Blood transfusion, without reported diagnosis    Depressive disorder, not elsewhere classified    Elevated blood pressure reading without diagnosis of hypertension    Endometriosis of fallopian tube    Family history of adverse reaction to anesthesia    Father - PONV   Irritable bowel syndrome    PONV (postoperative nausea and vomiting)    Streptococcal meningitis    Undiagnosed cardiac murmurs    Unspecified personal history presenting hazards to health     Wears contact lenses    Current Outpatient Medications on File Prior to Visit  Medication Sig Dispense Refill   cariprazine (VRAYLAR) 1.5 MG capsule Take 1 capsule (1.5 mg total) by mouth daily. 90 capsule 1   Cyanocobalamin (B-12) 1000 MCG SUBL Place 1 tablet under the tongue daily. 30 tablet 0   meloxicam (MOBIC) 15 MG tablet Take 1 tablet (15 mg total) by mouth daily. 90 tablet 1   modafinil (PROVIGIL) 100 MG tablet Take 1 tablet (100 mg total) by mouth daily. 90 tablet 1   pregabalin (LYRICA) 150 MG capsule Take 1 capsule (150 mg total) by mouth 2 (two) times daily. 180 capsule 1   Semaglutide (RYBELSUS) 7 MG TABS Take 7 mg by mouth daily. 90 tablet 1   TURMERIC PO Take by mouth daily.     Vibegron (GEMTESA) 75 MG TABS Take 1 tablet by mouth daily. 30 tablet 11   Vitamin D, Ergocalciferol, (DRISDOL) 1.25 MG (50000 UNIT) CAPS capsule Take 1 capsule (50,000 Units total) by mouth every 7 (seven) days. 12 capsule 1   No current facility-administered medications on file prior to visit.        Observations/Objective: home sleep study that was completed on April 28, 2022 that showed very severe sleep apnea with a AHI at 40.5/hour.   Assessment and Plan: Severe OSA -excellent control and compliance on CPAP  - no changes.  Hypersomina with OSA despite CPAP - doing well on Modafinil (rx  by PCP ) - no changes   Plan  Patient Instructions  Continue on CPAP at bedtime.  Goal is to wear all night long for at least 6 or more hours Keep up great work .  Healthy weight loss  Healthy sleep regimen  Do not drive if sleepy  Follow up in 6  months and As needed      Follow Up Instructions:    I discussed the assessment and treatment plan with the patient. The patient was provided an opportunity to ask questions and all were answered. The patient agreed with the plan and demonstrated an understanding of the instructions.   The patient was advised to call back or seek an in-person evaluation  if the symptoms worsen or if the condition fails to improve as anticipated.  I provided 22  minutes of non-face-to-face time during this encounter.   Rexene Edison, NP

## 2022-08-13 ENCOUNTER — Ambulatory Visit: Payer: Self-pay | Admitting: *Deleted

## 2022-08-13 NOTE — Patient Outreach (Signed)
  Care Coordination   08/13/2022 Name: Deborah Navarro MRN: [id number] DOB: 08/27/1980   Care Coordination Outreach Attempts:  An unsuccessful telephone outreach was attempted for a scheduled appointment today.  Follow Up Plan:  Additional outreach attempts will be made to offer the patient care coordination information and services.   Encounter Outcome:  No Answer   Care Coordination Interventions:  No, not indicated    Valente David, RN, MSN, [facility] Care Management Care Management Coordinator [phone]

## 2022-08-18 ENCOUNTER — Telehealth: Payer: Self-pay | Admitting: *Deleted

## 2022-08-18 NOTE — Progress Notes (Signed)
  Care Coordination Note  08/18/2022 Name: Deborah Navarro MRN: [id number] DOB: 08/08/1980  Deborah Navarro is a 42 y.o. year old female who is a primary care patient of [name], MD and is actively engaged with the care management team. I reached out to [name] by phone today to assist with re-scheduling a follow up visit with the RN Case Manager  Follow up plan: Telephone appointment with care management team member scheduled for: 08/31/2022  Julian Hy, [place] Direct Dial: [phone]

## 2022-08-18 NOTE — Progress Notes (Signed)
  Care Coordination Note  08/18/2022 Name: Deborah Navarro MRN: [id number] DOB: Aug 13, 1980  Deborah Navarro is a 42 y.o. year old female who is a primary care patient of [name], MD and is actively engaged with the care management team. I reached out to [name] by phone today to assist with re-scheduling a follow up visit with the RN Case Manager  Follow up plan: Unsuccessful telephone outreach attempt made. A HIPAA compliant phone message was left for the patient providing contact information and requesting a return call.   Julian Hy, [place] Direct Dial: [phone]

## 2022-08-31 ENCOUNTER — Ambulatory Visit: Payer: Self-pay | Admitting: *Deleted

## 2022-08-31 NOTE — Patient Outreach (Signed)
  Care Coordination   Follow Up Visit Note   08/31/2022 Name: Deborah Navarro MRN: [id number] DOB: 1980-10-24  Deborah Navarro is a 42 y.o. year old female who sees Deborah Sizer, MD for primary care. I spoke with  Deborah Navarro by phone today.  What matters to the patients health and wellness today?  Report she continues to do well with CPAP.  Denies any urgent concerns, encouraged to contact this care manager with questions.     Goals Addressed             This Visit's Progress    COMPLETED: RNCM: OSA   On track    Care Coordination Interventions: Evaluation of current treatment plan related to OSA and patient's adherence to plan as established by provider Advised patient to Call pulmonology and urology office to schedule follow up appointments Reviewed scheduled/upcoming provider appointments including 07-01-2022 with PCP.  Due for follow up with pulmonology and urology both in Feb.. Discussed plans with patient for ongoing care management follow up and provided patient with direct contact information for care management team Advised patient to discuss changes in OSA and other chronic conditions with provider The patient agrees to work with the [facility] in the care coordination program.The goals of the program reviewed with the patient and the patient is receptive to [name]. The patient has the [facility] number to call for needs between [name].   Completed sleep study, using CPAP for the last 2 weeks.  State she feels more rested immediately upon waking but is still sleepy about 5-6 hours after being awake.  She will continue to use.           SDOH assessments and interventions completed:  No     Care Coordination Interventions:  Yes, provided   Follow up plan: No further intervention required.   Encounter Outcome:  Pt. Visit Completed   Valente David, RN, MSN, [name] Care Management Care Management Coordinator [phone]

## 2022-09-01 ENCOUNTER — Encounter: Payer: Self-pay | Admitting: Family Medicine

## 2022-09-01 DIAGNOSIS — G4733 Obstructive sleep apnea (adult) (pediatric): Secondary | ICD-10-CM | POA: Diagnosis not present

## 2022-09-02 ENCOUNTER — Ambulatory Visit (INDEPENDENT_AMBULATORY_CARE_PROVIDER_SITE_OTHER): Payer: BC Managed Care – PPO | Admitting: Physician Assistant

## 2022-09-02 ENCOUNTER — Encounter: Payer: Self-pay | Admitting: Physician Assistant

## 2022-09-02 VITALS — BP 134/82 | HR 94 | Temp 98.3°F | Resp 16 | Ht 64.0 in | Wt 208.5 lb

## 2022-09-02 DIAGNOSIS — J029 Acute pharyngitis, unspecified: Secondary | ICD-10-CM | POA: Diagnosis not present

## 2022-09-02 DIAGNOSIS — R0789 Other chest pain: Secondary | ICD-10-CM | POA: Diagnosis not present

## 2022-09-02 DIAGNOSIS — R5383 Other fatigue: Secondary | ICD-10-CM

## 2022-09-02 LAB — POCT RAPID STREP A (OFFICE): Rapid Strep A Screen: NEGATIVE

## 2022-09-02 MED ORDER — PREDNISONE 20 MG PO TABS: ORAL_TABLET | ORAL | 0 refills | Status: DC

## 2022-09-02 NOTE — Progress Notes (Signed)
Acute Office Visit   Patient: Deborah Navarro   DOB: Oct 26, 1980   42 y.o. Female  MRN: [id number] Visit Date: 09/02/2022  Today's healthcare provider: Dani Gobble Rasheem Figiel, PA-C  Introduced myself to the patient as a Journalist, newspaper and provided education on APPs in clinical practice.    Chief Complaint  Patient presents with   Sore Throat    Since Mon, pt feels also feeling some chest tightness   Fatigue    Onset for 3 weeks, unsure due to CPAP   Subjective    HPI HPI     Sore Throat    Additional comments: Since Mon, pt feels also feeling some chest tightness        Fatigue    Additional comments: Onset for 3 weeks, unsure due to CPAP      Last edited by Salomon Fick, CMA on 09/02/2022  8:25 AM.       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   Cyanocobalamin (B-12) 1000 MCG SUBL Place 1 tablet under the tongue daily.   meloxicam (MOBIC) 15 MG tablet Take 1 tablet (15 mg total) by mouth daily.   modafinil (PROVIGIL) 100 MG tablet Take 1 tablet (100 mg total) by mouth daily.   pregabalin (LYRICA) 150 MG capsule Take 1 capsule (150 mg total) by mouth 2 (two) times daily.   Semaglutide (RYBELSUS) 7 MG TABS Take 7 mg by mouth daily.    TURMERIC PO Take by mouth daily.   Vibegron (GEMTESA) 75 MG TABS Take 1 tablet by mouth daily.   Vitamin D, Ergocalciferol, (DRISDOL) 1.25 MG (50000 UNIT) CAPS capsule Take 1 capsule (50,000 Units total) by mouth every 7 (seven) days.   No facility-administered medications prior to visit.    Review of Systems  Constitutional:  Positive for fatigue and fever. Negative for chills.  HENT:  Positive for sore throat. Negative for congestion, ear pain, postnasal drip, rhinorrhea, sinus pressure and sinus pain.   Eyes:  Negative for discharge and itching.  Respiratory:  Positive for chest tightness and shortness of breath. Negative for wheezing.   Gastrointestinal:  Positive for nausea. Negative for diarrhea and vomiting.  Neurological:  Negative for dizziness, light-headedness and headaches.       Objective    BP 134/82   Pulse 94   Temp 98.3 F (36.8 C) (Oral)   Resp 16   Ht 5\' 4"  (1.626 m)   Wt 208 lb 8 oz (94.6 kg)   SpO2 100%   BMI 35.79 kg/m    Physical Exam Vitals reviewed.  Constitutional:      General: She is awake.  Appearance: Normal appearance. She is well-developed and well-groomed.  HENT:     Head: Normocephalic and atraumatic.     Right Ear: Hearing, tympanic membrane and ear canal normal.     Left Ear: Hearing, tympanic membrane and ear canal normal.     Mouth/Throat:     Lips: Pink.     Mouth: Mucous membranes are moist.     Pharynx: Oropharynx is clear. Uvula midline. No oropharyngeal exudate or posterior oropharyngeal erythema.  Eyes:     General: Lids are normal. Gaze aligned appropriately.     Extraocular Movements: Extraocular movements intact.     Conjunctiva/sclera: Conjunctivae normal.     Pupils: Pupils are equal, round, and reactive to light.  Cardiovascular:     Rate and Rhythm: Normal rate and regular rhythm.  Pulmonary:     Effort: Pulmonary effort is normal.     Breath sounds: Normal breath sounds. No decreased air movement. No decreased  breath sounds, wheezing, rhonchi or rales.  Musculoskeletal:     Cervical back: Normal range of motion and neck supple.  Neurological:     General: No focal deficit present.     Mental Status: She is alert and oriented to person, place, and time. Mental status is at baseline.  Psychiatric:        Mood and Affect: Mood normal.        Behavior: Behavior normal. Behavior is cooperative.        Thought Content: Thought content normal.        Judgment: Judgment normal.       Results for orders placed or performed in visit on 09/02/22  POCT rapid strep A  Result Value Ref Range   Rapid Strep A Screen Negative Negative    Assessment & Plan      No follow-ups on file.     Problem List Items Addressed This Visit   None Visit Diagnoses     Sore throat    -  Primary Acute, new concern Patient reports mild scratchy throat, low-grade fever, fatigue for the past 3 days Centor criteria score of 0, rapid strep is negative today.  Results of testing reviewed with patient during appointment. Will also order COVID testing for rule out. Results of COVID and Monospot testing to dictate further management. For now recommend using Tylenol and ibuprofen as tolerated for pain management and making sure that she is staying adequately hydrated. Follow-up as needed for persisting or progressing symptoms.   Relevant Orders   POCT rapid strep A (Completed)   Monospot   Fatigue, unspecified type     Acute, ongoing concern Patient reports persistent fatigue for the last 3 weeks that is not improving Patient reports that her CPAP machine is showing adequate rest, appropriate seal of mask, average of 1 apneic event per hour Unsure if current fatigue plus sore throat plus chest pain are related or isolated issues. Recommend if fatigue symptoms persist that she reaches out to her sleep medicine provider to make sure that her CPAP is functioning correctly. Follow-up with PCP as needed for persistent or  progressive symptoms.   Relevant Orders   Monospot   Novel Coronavirus, NAA ([facility])   Chest tightness     Acute, new problem Patient reports mild chest tightness for the last 3 days Rapid strep is negative.  Will test for COVID and mono as patient reports she has a previous history of having mono. Will provide prednisone taper to assist with chest tightness Suspect that  mild sore throat and chest tightness as well as fatigue could potentially be related to allergies given time of year and lack of other infectious symptoms.  Recommend starting an antihistamine daily to assist with this. Follow-up as needed for persistent or progressing symptoms.   Relevant Medications   predniSONE (DELTASONE) 20 MG tablet   Other Relevant Orders   Novel Coronavirus, NAA ([facility])        No follow-ups on file.   I, Grier Vu E Revecca Nachtigal, PA-C, have reviewed all documentation for this visit. The documentation on 09/02/22 for the exam, diagnosis, procedures, and orders are all accurate and complete.   Talitha Givens, MHS, PA-C [facility]

## 2022-09-03 LAB — MONONUCLEOSIS SCREEN: Heterophile, Mono Screen: NEGATIVE

## 2022-09-04 ENCOUNTER — Ambulatory Visit: Payer: BC Managed Care – PPO | Admitting: Family Medicine

## 2022-09-04 LAB — NOVEL CORONAVIRUS, NAA: SARS-CoV-2, NAA: NOT DETECTED

## 2022-09-04 LAB — SPECIMEN STATUS REPORT

## 2022-10-08 ENCOUNTER — Other Ambulatory Visit: Payer: Self-pay | Admitting: Family Medicine

## 2022-10-08 DIAGNOSIS — M797 Fibromyalgia: Secondary | ICD-10-CM

## 2022-10-08 DIAGNOSIS — E559 Vitamin D deficiency, unspecified: Secondary | ICD-10-CM

## 2022-10-08 DIAGNOSIS — M79671 Pain in right foot: Secondary | ICD-10-CM

## 2022-11-09 ENCOUNTER — Encounter: Payer: Self-pay | Admitting: Family Medicine

## 2022-11-11 ENCOUNTER — Ambulatory Visit (INDEPENDENT_AMBULATORY_CARE_PROVIDER_SITE_OTHER): Payer: BC Managed Care – PPO | Admitting: Family Medicine

## 2022-11-11 ENCOUNTER — Encounter: Payer: Self-pay | Admitting: Family Medicine

## 2022-11-11 ENCOUNTER — Ambulatory Visit
Admission: RE | Admit: 2022-11-11 | Discharge: 2022-11-11 | Disposition: A | Discharge status: Home / Self Care | Payer: BC Managed Care – PPO | Source: Ambulatory Visit | Attending: Family Medicine | Admitting: Family Medicine

## 2022-11-11 ENCOUNTER — Ambulatory Visit
Admission: RE | Admit: 2022-11-11 | Discharge: 2022-11-11 | Disposition: A | Discharge status: Home / Self Care | Payer: BC Managed Care – PPO | Attending: Family Medicine | Admitting: Family Medicine

## 2022-11-11 VITALS — BP 134/76 | HR 81 | Temp 98.0°F | Resp 16 | Ht 64.0 in | Wt 222.2 lb

## 2022-11-11 DIAGNOSIS — M79675 Pain in left toe(s): Secondary | ICD-10-CM

## 2022-11-11 DIAGNOSIS — M79672 Pain in left foot: Secondary | ICD-10-CM | POA: Diagnosis not present

## 2022-11-11 DIAGNOSIS — M7732 Calcaneal spur, left foot: Secondary | ICD-10-CM | POA: Diagnosis not present

## 2022-11-11 NOTE — Patient Instructions (Addendum)
Take your mobic daily for 2 weeks and you can take tylenol [phone] mg up to 4 x a day Try to elevate and ice the area and make sure you have supportive foot ware. Let me know if you want a podiatry referral   Foot Pain Many things can cause foot pain. Common causes include injuries to the foot. The injuries include sprains or broken bones, or injuries that affect the nerves in the feet. Other causes of foot pain include arthritis, blisters, and bunions. To know what causes your foot pain, your health care provider will take a detailed history of your symptoms. They will also do a physical exam as well as imaging tests, such as X-ray or MRI. Follow these instructions at home: Managing pain, stiffness, and swelling  If told, put ice on the painful area. Put ice in a plastic bag. Place a towel between your skin and the bag. Leave the ice on for 20 minutes, 2-3 times a day. If your skin turns bright red, remove the ice right away to prevent skin damage. The risk of damage is higher if you cannot feel pain, heat, or cold. Activity Do not stand or walk for long periods. Do stretches to relieve foot pain and stiffness as told by your provider. Do not lift anything that is heavier than 10 lb (4.5 kg), or the limit that you are told, until your provider says that it is safe. Lifting a lot of weight can put added pressure on your feet. Return to your normal activities as told by your provider. Ask your provider what activities are safe for you. Lifestyle Wear comfortable, supportive shoes that fit you well. Do not wear high heels. Keep your feet clean and dry. General instructions Take over-the-counter and prescription medicines only as told by your provider. Rub your foot gently. Pay attention to any changes in your symptoms. Let your provider know if symptoms become worse. Keep all follow-up visits. Your provider will want to monitor your progress. Contact a health care provider if: Your pain  does not get better after a few days of treatment at home. Your pain gets worse. You cannot stand on your foot. Your foot or toes are swollen. Your foot is numb or tingling. Get help right away if: Your foot or toes turn white or blue. You have warmth and redness along your foot. This information is not intended to replace advice given to you by your health care provider. Make sure you discuss any questions you have with your health care provider. Document Revised: 06/25/2022 Document Reviewed: 03/03/2022 Elsevier Patient Education  2024 [employer].

## 2022-11-11 NOTE — Progress Notes (Signed)
Patient ID: Deborah Navarro, female    DOB: 1980/11/16, 42 y.o.   MRN: [id number]  PCP: Alba Cory, MD  Chief Complaint  Patient presents with   Pain    Left foot on outer side of the pinky. Pt states its a throbbing pain, hurts while being active onset for 3 weeks    Subjective:   Deborah Navarro is a 42 y.o. female, presents to clinic with CC of the following:  HPI  She complains of left outer foot pain located to her left fifth metatarsal area feels pain on the outside of her foot and radiating into her pinky toe Pain is worse with squeezing the foot or being on her feet for long periods of time No injury redness or swelling No recent strenuous activity or long walking out of ordinary for her  Patient Active Problem List   Diagnosis Date Noted   Vitamin D deficiency 07/01/2022   Neuropathic pain 07/01/2022   Fibromyalgia syndrome 07/01/2022   Pre-diabetes 07/01/2022   Dyslipidemia 07/01/2022   Snoring 04/21/2022   Morbid obesity (HCC) 10/01/2021   Cervical disc disease 03/07/2020   Irritable bowel syndrome (IBS) 09/26/2019   Mood disorder (HCC) 08/05/2018   Papanicolaou smear of cervix with positive high risk human papilloma virus (HPV) test 05/04/2018   False positive ana 04/28/2018   Pain in both feet 04/28/2018   Facial rash 04/07/2018   Chronic fatigue 04/07/2018   Chronic neck pain 03/27/2016   Myalgia 03/27/2016   Left-sided headache 03/27/2016   Family history of early CAD 03/15/2015   History of colon polyps 06/30/2010   B12 deficiency 06/30/2010   History of anemia 06/30/2010   Depression, major, in remission (HCC) 06/30/2010   Endometriosis 06/30/2010   CARDIAC MURMUR 06/30/2010   History of chicken pox 06/30/2010      Current Outpatient Medications:    cariprazine (VRAYLAR) 1.5 MG capsule, Take 1 capsule (1.5 mg total) by mouth daily., Disp: 90 capsule, Rfl: 1   Cyanocobalamin (B-12) 1000 MCG SUBL, Place 1 tablet under the tongue  daily., Disp: 30 tablet, Rfl: 0   meloxicam (MOBIC) 15 MG tablet, TAKE 1 TABLET(15 MG) BY MOUTH DAILY, Disp: 90 tablet, Rfl: 1   modafinil (PROVIGIL) 100 MG tablet, Take 1 tablet (100 mg total) by mouth daily., Disp: 90 tablet, Rfl: 1   predniSONE (DELTASONE) 20 MG tablet, Take 60mg  PO daily x 2 days, then40mg  PO daily x 2 days, then 20mg  PO daily x 3 days, Disp: 13 tablet, Rfl: 0   Semaglutide (RYBELSUS) 7 MG TABS, Take 7 mg by mouth daily., Disp: 90 tablet, Rfl: 1   TURMERIC PO, Take by mouth daily., Disp: , Rfl:    Vitamin D, Ergocalciferol, (DRISDOL) 1.25 MG (50000 UNIT) CAPS capsule, TAKE 1 CAPSULE BY MOUTH EVERY 7 DAYS, Disp: 12 capsule, Rfl: 1   pregabalin (LYRICA) 150 MG capsule, Take 1 capsule (150 mg total) by mouth 2 (two) times daily. (Patient not taking: Reported on 11/11/2022), Disp: 180 capsule, Rfl: 1   Vibegron (GEMTESA) 75 MG TABS, Take 1 tablet by mouth daily. (Patient not taking: Reported on 11/11/2022), Disp: 30 tablet, Rfl: 11   No Known Allergies   Social History   Tobacco Use   Smoking status: Never   Smokeless tobacco: Never  Vaping Use   Vaping Use: Never used  Substance Use Topics   Alcohol use: Not Currently   Drug use: No      Chart Review Today: I  personally reviewed active problem list, medication list, allergies, family history, social history, health maintenance, notes from last encounter, lab results, imaging with the patient/caregiver today.   Review of Systems  Constitutional: Negative.   HENT: Negative.    Eyes: Negative.   Respiratory: Negative.    Cardiovascular: Negative.   Gastrointestinal: Negative.   Endocrine: Negative.   Genitourinary: Negative.   Musculoskeletal: Negative.   Skin: Negative.   Allergic/Immunologic: Negative.   Neurological: Negative.   Hematological: Negative.   Psychiatric/Behavioral: Negative.    All other systems reviewed and are negative.      Objective:   Vitals:   11/11/22 1414  BP: 134/76  Pulse:  81  Resp: 16  Temp: 98 F (36.7 C)  TempSrc: Oral  SpO2: 99%  Weight: 222 lb 3.2 oz (100.8 kg)  Height: 5\' 4"  (1.626 m)    Body mass index is 38.14 kg/m.  Physical Exam Vitals and nursing note reviewed.  Constitutional:      General: She is not in acute distress.    Appearance: Normal appearance. She is well-developed. She is obese. She is not ill-appearing, toxic-appearing or diaphoretic.  HENT:     Head: Normocephalic and atraumatic.     Nose: Nose normal.  Eyes:     General:        Right eye: No discharge.        Left eye: No discharge.     Conjunctiva/sclera: Conjunctivae normal.  Neck:     Trachea: No tracheal deviation.  Cardiovascular:     Rate and Rhythm: Normal rate and regular rhythm.  Pulmonary:     Effort: Pulmonary effort is normal. No respiratory distress.     Breath sounds: No stridor.  Musculoskeletal:        General: Normal range of motion.     Left ankle: Normal.     Left foot: Normal range of motion and normal capillary refill. No swelling, deformity, laceration, tenderness, bony tenderness or crepitus. Normal pulse.     Comments: Left foot exam only illicited tenderness when I squeezed the whole foot across MTP joint area, palpation of 5th mtp joint and distal mt w/o ttp  Skin:    General: Skin is warm and dry.     Findings: No rash.  Neurological:     Mental Status: She is alert.     Motor: No abnormal muscle tone.     Coordination: Coordination normal.  Psychiatric:        Behavior: Behavior normal.      Results for orders placed or performed in visit on 09/02/22  Novel Coronavirus, NAA ([facility])   Specimen: Nasopharyngeal(NP) swabs in vial transport medium   Nasopharynge  Resident  Result Value Ref Range   SARS-CoV-2, NAA Not Detected Not Detected  Monospot  Result Value Ref Range   Heterophile, Mono Screen NEGATIVE NEGATIVE  Specimen status report  Result Value Ref Range   specimen status report Comment   POCT rapid strep A  Result  Value Ref Range   Rapid Strep A Screen Negative Negative       Assessment & Plan:     ICD-10-CM   1. Left foot pain  M79.672 DG Foot Complete Left    2. 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  We discussed over-the-counter medications Tylenol and NSAIDs as well as conservative management and if x-ray is negative and her symptoms do not improve I do recommend consulting with podiatry     Danelle Berry, PA-C 11/11/22 2:59 PM

## 2022-11-13 ENCOUNTER — Encounter: Payer: Self-pay | Admitting: Family Medicine

## 2022-11-16 ENCOUNTER — Other Ambulatory Visit: Payer: Self-pay

## 2022-11-16 DIAGNOSIS — M79675 Pain in left toe(s): Secondary | ICD-10-CM

## 2022-11-16 DIAGNOSIS — M79672 Pain in left foot: Secondary | ICD-10-CM

## 2022-11-17 ENCOUNTER — Encounter: Payer: Self-pay | Admitting: Advanced Practice Midwife

## 2022-11-25 ENCOUNTER — Encounter: Payer: Self-pay | Admitting: Podiatry

## 2022-11-25 ENCOUNTER — Ambulatory Visit (INDEPENDENT_AMBULATORY_CARE_PROVIDER_SITE_OTHER): Payer: BC Managed Care – PPO | Admitting: Podiatry

## 2022-11-25 DIAGNOSIS — M7752 Other enthesopathy of left foot: Secondary | ICD-10-CM | POA: Diagnosis not present

## 2022-11-25 MED ORDER — TRIAMCINOLONE ACETONIDE 40 MG/ML IJ SUSP
20.0000 mg | Freq: Once | INTRAMUSCULAR | Status: AC
  Administered 2022-11-25: 20 mg

## 2022-11-25 NOTE — Progress Notes (Signed)
Subjective:  Patient ID: Deborah Navarro, female    DOB: 10/04/1980,  MRN: [id number] HPI Chief Complaint  Patient presents with   Foot Pain    Dorsal forefoot left - aching, burning radiating into 3rd and 4th toes x 1 month, recently more towards the 5th toe, worse with standing in one spot, takes meloxicam for fibromyalgia   Nail Problem    3rd toenail left - thick   New Patient (Initial Visit)    42 y.o. female presents with the above complaint.   ROS: Denies fever chills nausea vomit muscle aches pains calf pain back pain chest pain shortness of breath  Past Medical History:  Diagnosis Date   Anemia, unspecified    ASD (atrial septal defect)    repaired 1996   Blood transfusion, without reported diagnosis    Depressive disorder, not elsewhere classified    Elevated blood pressure reading without diagnosis of hypertension    Endometriosis of fallopian tube    Family history of adverse reaction to anesthesia    Father - PONV   Irritable bowel syndrome    PONV (postoperative nausea and vomiting)    Streptococcal meningitis    Undiagnosed cardiac murmurs    Unspecified personal history presenting hazards to health    Wears contact lenses    Past Surgical History:  Procedure Laterality Date   COLONOSCOPY WITH PROPOFOL N/A 05/16/2018   Procedure: COLONOSCOPY WITH PROPOFOL;  Surgeon: Deborah Minium, MD;  Location: [facility];  Service: Endoscopy;  Laterality: N/A;   DILATION AND CURETTAGE OF UTERUS     LAPAROSCOPIC ENDOMETRIOSIS FULGURATION  10-1999   endometriosis   NECK SURGERY N/A 06/14/15   due to C5/6 C6-7 disc herniation    open heart surgery  12-1994   asd repair    Current Outpatient Medications:    cariprazine (VRAYLAR) 1.5 MG capsule, Take 1 capsule (1.5 mg total) by mouth daily., Disp: 90 capsule, Rfl: 1   Cyanocobalamin (B-12) 1000 MCG SUBL, Place 1 tablet under the tongue daily., Disp: 30 tablet, Rfl: 0   meloxicam (MOBIC) 15 MG tablet, TAKE 1  TABLET(15 MG) BY MOUTH DAILY, Disp: 90 tablet, Rfl: 1   modafinil (PROVIGIL) 100 MG tablet, Take 1 tablet (100 mg total) by mouth daily., Disp: 90 tablet, Rfl: 1   pregabalin (LYRICA) 150 MG capsule, Take 1 capsule (150 mg total) by mouth 2 (two) times daily., Disp: 180 capsule, Rfl: 1   Semaglutide (RYBELSUS) 7 MG TABS, Take 7 mg by mouth daily., Disp: 90 tablet, Rfl: 1   TURMERIC PO, Take by mouth daily., Disp: , Rfl:    Vitamin D, Ergocalciferol, (DRISDOL) 1.25 MG (50000 UNIT) CAPS capsule, TAKE 1 CAPSULE BY MOUTH EVERY 7 DAYS, Disp: 12 capsule, Rfl: 1  No Known Allergies Review of Systems Objective:  There were no vitals filed for this visit.  General: Well developed, nourished, in no acute distress, alert and oriented x3   Dermatological: Skin is warm, dry and supple bilateral. Nails x 10 are well maintained; remaining integument appears unremarkable at this time. There are no open sores, no preulcerative lesions, no rash or signs of infection present.  Vascular: Dorsalis Pedis artery and Posterior Tibial artery pedal pulses are 2/4 bilateral with immedate capillary fill time. Pedal hair growth present. No varicosities and no lower extremity edema present bilateral.   Neruologic: Grossly intact via light touch bilateral. Vibratory intact via tuning fork bilateral. Protective threshold with Semmes Wienstein monofilament intact to all pedal sites bilateral.  Patellar and Achilles deep tendon reflexes 2+ bilateral. No Babinski or clonus noted bilateral.   Musculoskeletal: No gross boney pedal deformities bilateral. No pain, crepitus, or limitation noted with foot and ankle range of motion bilateral. Muscular strength 5/5 in all groups tested bilateral.  Pain of patient plantar aspect of the head of the first metatarsal extending around to the dorsal aspect.  Gait: Unassisted, Nonantalgic.    Radiographs:  Radiographs taken today do not demonstrate any type of osseous abnormalities good  mineralization of the bone.  Assessment & Plan:   Assessment: Capsulitis bursitis osteoarthritis fifth metatarsal head of the left foot  Plan: Discussed etiology pathology and surgical therapies injected the area today     Deborah Frane T. [place] Navarro, [place]

## 2022-12-02 ENCOUNTER — Ambulatory Visit: Payer: BC Managed Care – PPO | Admitting: Certified Nurse Midwife

## 2022-12-04 DIAGNOSIS — G4733 Obstructive sleep apnea (adult) (pediatric): Secondary | ICD-10-CM | POA: Diagnosis not present

## 2022-12-16 NOTE — Progress Notes (Signed)
Name: Deborah Navarro   MRN: [id number]    DOB: 1981-04-15   Date:12/21/2022       Progress Note  Subjective  Chief Complaint  Follow Up  HPI  OSA: she started on CPAP about 4 weeks ago auto pap 4-20 cmH2O, she showed me her app and compliance is 100 % , event per hour down to 0.9. She states she still needs to take Modafinil to stay awake but in general feeling better when she first gets up in the morning, also having less morning headaches. She needs a refill of medication    FMS: she was taking Lyrica  200 mg TID, she was gaining a lot of weight and is down to 150 mg BID, she also takes Meloxicam prn , pain level on medication is 1-2/10  most of the time.  She has history of elevated ANA , sed rate and CRP and sed rate. She was seen by Dr. Al Pimple and had more labs done, she has OA hands and positive ANA and at this time to try tumeric, ginger root daily. She continues to have discomfort under her breast but now also on inner thighs , periumbilical and also neck area - she has been seen by neurologist in 2017 and had MRI c-spine and brain that did not show MS Those episodes are not as often. She states pain also mostly present at night and would like to go down on dose of Lyrica to see if helps her stop gaining weight    Mood Disorder: .Insurance denied [employer] initially , she has tried Abilify, Elavil  and Duloxetine but did not work well for her. We did a PA and she has been on Vraylar 1.5 mg since 2021 and also modafinil for daytime fatigue and seems to be working for mood. 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 She is stable emotionally.   History of c-spine surgery: Dec 2016, she has dull aches on neck and very seldom has radiculitis on right side   IBS:she tried trulance but caused diarrhea and stopped medication, bowel movements are almost daily now between bristol of 3 and 4   Urge incontinence: under the care of  Dr. Sherron Monday and had a uro-dynamic study this morning. She was on Vesicar and initially felt like it was helping with nocturia but since it stopped working and it was causing constipation , she states constipation resolved after she stopped Vesicare She was given  [place] and it helped but caused diarrhea. She is now off medications and symptoms are stable   Morbid obesity:  BMI above 35 with co-morbidities, she has OA and pain on feet, neuropathy, dyslipidemia, we started her on Rybelsus Fall 2022 and she had lost 31 lbs since started medication October 2022 , only able to tolerate 7 mg dose, however she gained 19 lbs back in the past 6 months. Today's weight is 221.9 lbs. She is active at work - walking over 10000 steps 5 days a week. She wants to go down on lyrica dose to see if able to lose more weight   Pre-diabetes: she has insulin resistance, A1C of 5.7 % back in 10/22, she is taking Rybelsus and has early satiety and on higher dose she is feeling more nauseated, she is back on 7 mg , last A1C was 5.8 %   Bursitis of left foot: seen by Dr. Al Corpus and had steroid injection and has a follow up  soon  Intermenstrual bleeding: going to see gyn today , we will not check labs since she will see gyn today   Patient Active Problem List   Diagnosis Date Noted   Vitamin D deficiency 07/01/2022   Neuropathic pain 07/01/2022   Fibromyalgia syndrome 07/01/2022   Pre-diabetes 07/01/2022   Dyslipidemia 07/01/2022   Snoring 04/21/2022   Morbid obesity (HCC) 10/01/2021   Cervical disc disease 03/07/2020   Irritable bowel syndrome (IBS) 09/26/2019   Mood disorder (HCC) 08/05/2018   Papanicolaou smear of cervix with positive high risk human papilloma virus (HPV) test 05/04/2018   False positive ana 04/28/2018   Pain in both feet 04/28/2018   Facial rash 04/07/2018   Chronic fatigue 04/07/2018   Chronic neck pain 03/27/2016   Myalgia 03/27/2016   Left-sided headache 03/27/2016   Family history of  early CAD 03/15/2015   History of colon polyps 06/30/2010   B12 deficiency 06/30/2010   History of anemia 06/30/2010   Depression, major, in remission (HCC) 06/30/2010   Endometriosis 06/30/2010   CARDIAC MURMUR 06/30/2010   History of chicken pox 06/30/2010    Past Surgical History:  Procedure Laterality Date   COLONOSCOPY WITH PROPOFOL N/A 05/16/2018   Procedure: COLONOSCOPY WITH PROPOFOL;  Surgeon: Midge Minium, MD;  Location: [facility];  Service: Endoscopy;  Laterality: N/A;   DILATION AND CURETTAGE OF UTERUS     LAPAROSCOPIC ENDOMETRIOSIS FULGURATION  10-1999   endometriosis   NECK SURGERY N/A 06/14/15   due to C5/6 C6-7 disc herniation    open heart surgery  12-1994   asd repair    Family History  Problem Relation Age of Onset   Cancer Mother 62       non hodgkins lymphoma(mets to brain)   Coronary artery disease Father    Hypertension Father    Hyperlipidemia Father    Diabetes Father    Diabetes Maternal Grandmother    Heart attack Maternal Grandfather    Other Maternal Grandfather        Deterioration of Jaw   Stroke Paternal Grandmother    Stroke Paternal Grandfather    Healthy Son     Social History   Tobacco Use   Smoking status: Never   Smokeless tobacco: Never  Substance Use Topics   Alcohol use: Not Currently     Current Outpatient Medications:    cariprazine (VRAYLAR) 1.5 MG capsule, Take 1 capsule (1.5 mg total) by mouth daily., Disp: 90 capsule, Rfl: 1   Cyanocobalamin (B-12) 1000 MCG SUBL, Place 1 tablet under the tongue daily., Disp: 30 tablet, Rfl: 0   meloxicam (MOBIC) 15 MG tablet, TAKE 1 TABLET(15 MG) BY MOUTH DAILY, Disp: 90 tablet, Rfl: 1   modafinil (PROVIGIL) 100 MG tablet, Take 1 tablet (100 mg total) by mouth daily., Disp: 90 tablet, Rfl: 1   pregabalin (LYRICA) 150 MG capsule, Take 1 capsule (150 mg total) by mouth 2 (two) times daily., Disp: 180 capsule, Rfl: 1   Semaglutide (RYBELSUS) 7 MG TABS, Take 7 mg by mouth daily.,  Disp: 90 tablet, Rfl: 1   TURMERIC PO, Take by mouth daily., Disp: , Rfl:    Vitamin D, Ergocalciferol, (DRISDOL) 1.25 MG (50000 UNIT) CAPS capsule, TAKE 1 CAPSULE BY MOUTH EVERY 7 DAYS, Disp: 12 capsule, Rfl: 1  No Known Allergies  I personally reviewed active problem list, medication list, allergies, family history, social history, health maintenance with the patient/caregiver today.   ROS  Ten systems reviewed and is negative except  as mentioned in HPI   Objective  Vitals:   12/21/22 0810  BP: 122/78  Pulse: 82  Resp: 16  Temp: 98.2 F (36.8 C)  TempSrc: Oral  SpO2: 99%  Weight: 221 lb 14.4 oz (100.7 kg)  Height: 5' 3.5" (1.613 m)    Body mass index is 38.69 kg/m.  Physical Exam  Constitutional: Patient appears well-developed and well-nourished. Obese  No distress.  HEENT: head atraumatic, normocephalic, pupils equal and reactive to light, neck supple Cardiovascular: Normal rate, regular rhythm and normal heart sounds.  No murmur heard. No BLE edema. Pulmonary/Chest: Effort normal and breath sounds normal. No respiratory distress. Abdominal: Soft.  There is no tenderness. Muscular skeletal : trigger point positive  Psychiatric: Patient has a normal mood and affect. behavior is normal. Judgment and thought content normal.   PHQ2/9:    12/21/2022    8:17 AM 11/11/2022    2:16 PM 09/02/2022    8:17 AM 07/01/2022   10:01 AM 04/23/2022   12:59 PM  Depression screen PHQ 2/9  Decreased Interest 0 0 0 1 0  Down, Depressed, Hopeless 0 0 0 1 0  PHQ - 2 Score 0 0 0 2 0  Altered sleeping 1 0 0 0   Tired, decreased energy 3 0 0 3   Change in appetite 3 0 0 1   Feeling bad or failure about yourself  0 0 0 0   Trouble concentrating 0 0 0 0   Moving slowly or fidgety/restless 0 0 0 0   Suicidal thoughts 0 0 0 0   PHQ-9 Score 7 0 0 6   Difficult doing work/chores Very difficult Not difficult at all Not difficult at all      phq 9 is negative   Fall Risk:    12/21/2022     8:17 AM 11/11/2022    2:16 PM 09/02/2022    8:17 AM 07/01/2022   10:01 AM 04/23/2022   12:58 PM  Fall Risk   Falls in the past year? 0 0 0 0 0  Number falls in past yr:  0 0 0 0  Injury with Fall?  0 0 0 0  Risk for fall due to : No Fall Risks No Fall Risks No Fall Risks No Fall Risks   Follow up Falls prevention discussed Falls prevention discussed;Education provided;Falls evaluation completed Falls prevention discussed;Education provided;Falls evaluation completed Falls prevention discussed Falls evaluation completed      Functional Status Survey: Is the patient deaf or have difficulty hearing?: No Does the patient have difficulty seeing, even when wearing glasses/contacts?: No Does the patient have difficulty concentrating, remembering, or making decisions?: No Does the patient have difficulty walking or climbing stairs?: No Does the patient have difficulty dressing or bathing?: No Does the patient have difficulty doing errands alone such as visiting a doctor's office or shopping?: No    Assessment & Plan  1. Morbid obesity (HCC)  Discussed with the patient the risk posed by an increased BMI. Discussed importance of portion control, calorie counting and at least 150 minutes of physical activity weekly. Avoid sweet beverages and drink more water. Eat at least 6 servings of fruit and vegetables daily    2. Mood disorder (HCC)  - cariprazine (VRAYLAR) 1.5 MG capsule; Take 1 capsule (1.5 mg total) by mouth daily.  Dispense: 90 capsule; Refill: 1 - pregabalin (LYRICA) 150 MG capsule; Take 1 capsule (150 mg total) by mouth at bedtime.  Dispense: 90 capsule; Refill: 1 -  modafinil (PROVIGIL) 100 MG tablet; Take 1 tablet (100 mg total) by mouth daily.  Dispense: 90 tablet; Refill: 1 - DULoxetine (CYMBALTA) 30 MG capsule; Take 1 capsule (30 mg total) by mouth in the morning.  Dispense: 30 capsule; Refill: 0 - DULoxetine (CYMBALTA) 60 MG capsule; Take 1 capsule (60 mg total) by mouth daily.  After the 30 mg dose  Dispense: 90 capsule; Refill: 1  3. Fibromyalgia syndrome  - pregabalin (LYRICA) 150 MG capsule; Take 1 capsule (150 mg total) by mouth at bedtime.  Dispense: 90 capsule; Refill: 1 - DULoxetine (CYMBALTA) 30 MG capsule; Take 1 capsule (30 mg total) by mouth in the morning.  Dispense: 30 capsule; Refill: 0 - DULoxetine (CYMBALTA) 60 MG capsule; Take 1 capsule (60 mg total) by mouth daily. After the 30 mg dose  Dispense: 90 capsule; Refill: 1  4. Pre-diabetes  - Semaglutide (RYBELSUS) 7 MG TABS; Take 1 tablet (7 mg total) by mouth daily.  Dispense: 90 tablet; Refill: 1  5. Neuropathic pain  - pregabalin (LYRICA) 150 MG capsule; Take 1 capsule (150 mg total) by mouth at bedtime.  Dispense: 90 capsule; Refill: 1  6. Bursitis of left foot

## 2022-12-21 ENCOUNTER — Encounter: Payer: Self-pay | Admitting: Family Medicine

## 2022-12-21 ENCOUNTER — Ambulatory Visit (INDEPENDENT_AMBULATORY_CARE_PROVIDER_SITE_OTHER): Payer: BC Managed Care – PPO | Admitting: Family Medicine

## 2022-12-21 ENCOUNTER — Ambulatory Visit: Payer: BC Managed Care – PPO | Admitting: Licensed Practical Nurse

## 2022-12-21 DIAGNOSIS — R7303 Prediabetes: Secondary | ICD-10-CM | POA: Diagnosis not present

## 2022-12-21 DIAGNOSIS — M797 Fibromyalgia: Secondary | ICD-10-CM

## 2022-12-21 DIAGNOSIS — M7752 Other enthesopathy of left foot: Secondary | ICD-10-CM

## 2022-12-21 DIAGNOSIS — F39 Unspecified mood [affective] disorder: Secondary | ICD-10-CM | POA: Diagnosis not present

## 2022-12-21 DIAGNOSIS — M792 Neuralgia and neuritis, unspecified: Secondary | ICD-10-CM

## 2022-12-21 MED ORDER — CARIPRAZINE HCL 1.5 MG PO CAPS: 1.5000 mg | ORAL_CAPSULE | Freq: Every day | ORAL | 1 refills | Status: DC

## 2022-12-21 MED ORDER — RYBELSUS 7 MG PO TABS: 7.0000 mg | ORAL_TABLET | Freq: Every day | ORAL | 1 refills | Status: DC

## 2022-12-21 MED ORDER — PREGABALIN 150 MG PO CAPS: 150.0000 mg | ORAL_CAPSULE | Freq: Every evening | ORAL | 1 refills | Status: DC

## 2022-12-21 MED ORDER — DULOXETINE HCL 30 MG PO CPEP: 30.0000 mg | ORAL_CAPSULE | Freq: Every morning | ORAL | 0 refills | Status: DC

## 2022-12-21 MED ORDER — DULOXETINE HCL 60 MG PO CPEP: 60.0000 mg | ORAL_CAPSULE | Freq: Every day | ORAL | 1 refills | Status: DC

## 2022-12-21 MED ORDER — MODAFINIL 100 MG PO TABS: 100.0000 mg | ORAL_TABLET | Freq: Every day | ORAL | 1 refills | Status: DC

## 2023-01-04 ENCOUNTER — Ambulatory Visit: Payer: BC Managed Care – PPO | Admitting: Licensed Practical Nurse

## 2023-01-06 ENCOUNTER — Ambulatory Visit (INDEPENDENT_AMBULATORY_CARE_PROVIDER_SITE_OTHER): Payer: BC Managed Care – PPO | Admitting: Podiatry

## 2023-01-06 DIAGNOSIS — L603 Nail dystrophy: Secondary | ICD-10-CM | POA: Diagnosis not present

## 2023-01-06 DIAGNOSIS — B351 Tinea unguium: Secondary | ICD-10-CM | POA: Diagnosis not present

## 2023-01-06 NOTE — Progress Notes (Signed)
She presents today for follow-up of her forefoot subfifth metatarsal pain as well as lead letting her third toenail on her left foot grow out so that we could take a sample of it.  She states that she is 100% improved as far as the subfifth metatarsal area goes.  Objective: Vital signs stable alert oriented x 3 no reproducible pain on palpation subfifth met head left also she does have elongated thickened dystrophic with subungual debris nail third digit left foot.  Assessment: Nail dystrophy possible onychomycosis resolution bursitis subfifth met left.  Plan: Debridement

## 2023-01-15 ENCOUNTER — Ambulatory Visit: Payer: BC Managed Care – PPO | Admitting: Adult Health

## 2023-01-17 ENCOUNTER — Other Ambulatory Visit: Payer: Self-pay | Admitting: Family Medicine

## 2023-01-17 DIAGNOSIS — M797 Fibromyalgia: Secondary | ICD-10-CM

## 2023-01-17 DIAGNOSIS — F39 Unspecified mood [affective] disorder: Secondary | ICD-10-CM

## 2023-01-18 ENCOUNTER — Other Ambulatory Visit: Payer: Self-pay | Admitting: Family Medicine

## 2023-01-19 ENCOUNTER — Ambulatory Visit (INDEPENDENT_AMBULATORY_CARE_PROVIDER_SITE_OTHER): Payer: BC Managed Care – PPO

## 2023-01-19 VITALS — BP 121/78 | HR 80 | Ht 63.5 in | Wt 216.0 lb

## 2023-01-19 DIAGNOSIS — N926 Irregular menstruation, unspecified: Secondary | ICD-10-CM | POA: Diagnosis not present

## 2023-01-19 NOTE — Progress Notes (Signed)
GYN ENCOUNTER  Encounter for Irregular bleeding  Subjective  HPI: Deborah Navarro is a 42 y.o. G2P0011 who presents today for evaluation of irregular bleeding.   She reports having a history of regular monthly periods until the last 6 months. About 6 months ago, she noted a regular period with 3-4 days of heavy bleeding followed by a couple of days of light bleeding. She then had several days of spotting about two weeks after her normal period finished. This cycle has repeated every month for the last 6 months.   In addition to the PMH noted below, she states she has a history of endometriosis. She is not currently using any form of contraception although she has used COCs in the past to manage heavy periods.   Past Medical History:  Diagnosis Date   Anemia, unspecified    ASD (atrial septal defect)    repaired 1996   Blood transfusion, without reported diagnosis    Depressive disorder, not elsewhere classified    Elevated blood pressure reading without diagnosis of hypertension    Endometriosis of fallopian tube    Family history of adverse reaction to anesthesia    Father - PONV   Irritable bowel syndrome    PONV (postoperative nausea and vomiting)    Streptococcal meningitis    Undiagnosed cardiac murmurs    Unspecified personal history presenting hazards to health    Wears contact lenses    Past Surgical History:  Procedure Laterality Date   COLONOSCOPY WITH PROPOFOL N/A 05/16/2018   Procedure: COLONOSCOPY WITH PROPOFOL;  Surgeon: Midge Minium, MD;  Location: [facility];  Service: Endoscopy;  Laterality: N/A;   DILATION AND CURETTAGE OF UTERUS     LAPAROSCOPIC ENDOMETRIOSIS FULGURATION  10-1999   endometriosis   NECK SURGERY N/A 06/14/15   due to C5/6 C6-7 disc herniation    open heart surgery  12-1994   asd repair   OB History     Gravida  2   Para      Term      Preterm      AB  1   Living  1      SAB  1   IAB      Ectopic       Multiple      Live Births  1        Obstetric Comments  One child born 2002 had a miscarriage in 2009        No Known Allergies  Review of Systems  12 point ROS negative except for pertinent positives noted in HPO above.   Objective  BP 121/78   Pulse 80   Ht 5' 3.5" (1.613 m)   Wt 216 lb (98 kg)   LMP 01/04/2023   BMI 37.66 kg/m   Physical examination GENERAL APPEARANCE: alert, well appearing LUNGS: normal work of breathing HEART: normal heart rate PELVIC: Pelvic exam: normal external genitalia, vulva, vagina, cervix, uterus and adnexa,  VAGINA: normal appearing vagina with normal color and discharge, no lesions,  CERVIX: normal appearing cervix without discharge or lesions, UTERUS: uterus is normal size, shape, consistency and nontender, ADNEXA: normal adnexa in size, nontender and no masses.   Assessment/Plan - Reviewed possible etiologies of intermenstrual bleeding including normal hormone changes of aging, structural abnormalities such as polyps or fibroids, or other more rare disease processes.  - FSH, Prolactin, TSH, CBC collected today. Transvaginal ultrasound ordered today.  - If all testing returns normal reviewed options for  management including use of hormonal contraception for management of symptoms. Discussed use of COCs, patch, ring, and Mirena IUD. Patient is unsure if she wants to pursue hormonal management at this time. Will consider based on results of testing from today.     Lindalou Hose Caylea Foronda, CNM  01/19/23 3:14 PM

## 2023-02-03 ENCOUNTER — Other Ambulatory Visit: Payer: BC Managed Care – PPO

## 2023-02-08 ENCOUNTER — Ambulatory Visit: Payer: BC Managed Care – PPO | Admitting: Podiatry

## 2023-02-17 ENCOUNTER — Telehealth: Payer: Self-pay

## 2023-02-17 ENCOUNTER — Telehealth: Payer: Self-pay | Admitting: Gastroenterology

## 2023-02-17 ENCOUNTER — Other Ambulatory Visit: Payer: Self-pay

## 2023-02-17 DIAGNOSIS — Z86010 Personal history of colonic polyps: Secondary | ICD-10-CM

## 2023-02-17 MED ORDER — NA SULFATE-K SULFATE-MG SULF 17.5-3.13-1.6 GM/177ML PO SOLN
1.0000 | Freq: Once | ORAL | 0 refills | Status: AC
Start: 2023-02-17 — End: 2023-02-17

## 2023-02-17 NOTE — Telephone Encounter (Signed)
Patient called in wanting to schedule her colonoscopy.

## 2023-02-17 NOTE — Telephone Encounter (Signed)
Gastroenterology Pre-Procedure Review  Request Date: 05/20/23 Requesting Physician: Dr. Servando Snare  PATIENT REVIEW QUESTIONS: The patient responded to the following health history questions as indicated:    1. Are you having any GI issues? no 2. Do you have a personal history of Polyps? Colonoscopy 05/16/2018 recommended repeat in 5 years 3. Do you have a family history of Colon Cancer or Polyps? no 4. Diabetes Mellitus? no 5. Joint replacements in the past 12 months?no 6. Major health problems in the past 3 months?no 7. Any artificial heart valves, MVP, or defibrillator?no    MEDICATIONS & ALLERGIES:    Patient reports the following regarding taking any anticoagulation/antiplatelet therapy:   Plavix, Coumadin, Eliquis, Xarelto, Lovenox, Pradaxa, Brilinta, or Effient? no Aspirin? no Weight mgmt meds? Semaglutide advised to stop 7 days prior to colonoscopy.  Patient confirms/reports the following medications:  Current Outpatient Medications  Medication Sig Dispense Refill   cariprazine (VRAYLAR) 1.5 MG capsule Take 1 capsule (1.5 mg total) by mouth daily. 90 capsule 1   Cyanocobalamin (B-12) 1000 MCG SUBL Place 1 tablet under the tongue daily. 30 tablet 0   DULoxetine (CYMBALTA) 30 MG capsule Take 1 capsule (30 mg total) by mouth in the morning. 30 capsule 0   DULoxetine (CYMBALTA) 60 MG capsule Take 1 capsule (60 mg total) by mouth daily. After the 30 mg dose 90 capsule 1   meloxicam (MOBIC) 15 MG tablet TAKE 1 TABLET(15 MG) BY MOUTH DAILY 90 tablet 1   modafinil (PROVIGIL) 100 MG tablet Take 1 tablet (100 mg total) by mouth daily. 90 tablet 1   pregabalin (LYRICA) 150 MG capsule Take 1 capsule (150 mg total) by mouth at bedtime. 90 capsule 1   Semaglutide (RYBELSUS) 7 MG TABS Take 1 tablet (7 mg total) by mouth daily. (Patient not taking: Reported on 01/19/2023) 90 tablet 1   Vitamin D, Ergocalciferol, (DRISDOL) 1.25 MG (50000 UNIT) CAPS capsule TAKE 1 CAPSULE BY MOUTH EVERY 7 DAYS 12  capsule 1   No current facility-administered medications for this visit.    Patient confirms/reports the following allergies:  No Known Allergies  No orders of the defined types were placed in this encounter.   AUTHORIZATION INFORMATION Primary Insurance: 1D#: Group #:  Secondary Insurance: 1D#: Group #:  SCHEDULE INFORMATION: Date: 05/20/23 Time: Location: [facility]

## 2023-02-24 ENCOUNTER — Encounter: Payer: Self-pay | Admitting: Podiatry

## 2023-02-24 ENCOUNTER — Ambulatory Visit (INDEPENDENT_AMBULATORY_CARE_PROVIDER_SITE_OTHER): Payer: BC Managed Care – PPO | Admitting: Podiatry

## 2023-02-24 DIAGNOSIS — B351 Tinea unguium: Secondary | ICD-10-CM | POA: Diagnosis not present

## 2023-02-24 DIAGNOSIS — S90852A Superficial foreign body, left foot, initial encounter: Secondary | ICD-10-CM | POA: Diagnosis not present

## 2023-02-24 MED ORDER — TERBINAFINE HCL 250 MG PO TABS: 250.0000 mg | ORAL_TABLET | Freq: Every day | ORAL | 0 refills | Status: DC

## 2023-02-24 NOTE — Progress Notes (Signed)
She presents today chief complaint of a painful area beneath her fourth metatarsal head on her left foot.  States that is just been bothering me now for the past few weeks she says it feels like a little knot.  She states that she noticed that happened while she was at work.  She is also here for her pathology report.  Objective: Also stable alert oriented x 3.  Pathology report does demonstrate saprophytic fungus as well as nail dystrophy.  Plantar aspect of the foot does demonstrate a small pustule with a central darkened core on the plantar aspect just beneath the fourth metatarsal head.  I painted this with Betadine and was able to remove what appeared to be a thorn that had some purulence around it.  Assessment: Foreign body plantar aspect left foot and onychomycosis with saprophytic fungus and nail dystrophy.  Plan: Discussed etiology pathology and surgical therapies debrided the foreign body for her today placed a Band-Aid with antibiotic ointment recommend she continue to do so until healed.  Also started her on Lamisil 250 mg tablets 1 p.o. daily for the first 30 days we will follow-up with her in 1 month for blood work.

## 2023-03-01 ENCOUNTER — Other Ambulatory Visit: Payer: Self-pay | Admitting: Family Medicine

## 2023-03-01 DIAGNOSIS — F39 Unspecified mood [affective] disorder: Secondary | ICD-10-CM

## 2023-03-02 ENCOUNTER — Ambulatory Visit (INDEPENDENT_AMBULATORY_CARE_PROVIDER_SITE_OTHER): Payer: BC Managed Care – PPO

## 2023-03-02 ENCOUNTER — Other Ambulatory Visit: Payer: Self-pay

## 2023-03-02 DIAGNOSIS — N939 Abnormal uterine and vaginal bleeding, unspecified: Secondary | ICD-10-CM

## 2023-03-02 DIAGNOSIS — N926 Irregular menstruation, unspecified: Secondary | ICD-10-CM

## 2023-03-05 ENCOUNTER — Telehealth: Payer: Self-pay

## 2023-03-05 NOTE — Telephone Encounter (Signed)
Patient called triage saying she had U/S 03/02/23 and she hasn't heard back regarding U/S. She is aware Maralyn Sago returns to clinic on Monday and is ok to wait for her.

## 2023-03-08 ENCOUNTER — Encounter: Payer: Self-pay | Admitting: Podiatry

## 2023-03-08 DIAGNOSIS — G4733 Obstructive sleep apnea (adult) (pediatric): Secondary | ICD-10-CM | POA: Diagnosis not present

## 2023-03-11 ENCOUNTER — Telehealth: Payer: Self-pay

## 2023-03-11 MED ORDER — NORGESTIM-ETH ESTRAD TRIPHASIC 0.18/0.215/0.25 MG-25 MCG PO TABS
1.0000 | ORAL_TABLET | Freq: Every day | ORAL | 11 refills | Status: DC
Start: 2023-03-11 — End: 2023-05-31

## 2023-03-11 NOTE — Telephone Encounter (Signed)
Verified patient name and DOB. Reviewed benign findings on TVUS and reviewed hormonal options for managing irregular bleeding. She previously used [employer] Engineer, drilling which worked well for managing her cycles when she was younger and would like to try this form of COC again. Rx provided.

## 2023-03-24 ENCOUNTER — Encounter: Payer: Self-pay | Admitting: Adult Health

## 2023-03-24 ENCOUNTER — Encounter: Payer: Self-pay | Admitting: Podiatry

## 2023-03-24 ENCOUNTER — Ambulatory Visit: Payer: BC Managed Care – PPO | Admitting: Adult Health

## 2023-03-24 ENCOUNTER — Ambulatory Visit (INDEPENDENT_AMBULATORY_CARE_PROVIDER_SITE_OTHER): Payer: BC Managed Care – PPO | Admitting: Podiatry

## 2023-03-24 VITALS — BP 130/86 | HR 96 | Ht 63.5 in | Wt 224.8 lb

## 2023-03-24 DIAGNOSIS — G4733 Obstructive sleep apnea (adult) (pediatric): Secondary | ICD-10-CM

## 2023-03-24 DIAGNOSIS — Z23 Encounter for immunization: Secondary | ICD-10-CM

## 2023-03-24 DIAGNOSIS — B351 Tinea unguium: Secondary | ICD-10-CM | POA: Diagnosis not present

## 2023-03-24 DIAGNOSIS — Z79899 Other long term (current) drug therapy: Secondary | ICD-10-CM

## 2023-03-24 MED ORDER — TERBINAFINE HCL 250 MG PO TABS: 250.0000 mg | ORAL_TABLET | Freq: Every day | ORAL | 0 refills | Status: DC

## 2023-03-24 NOTE — Progress Notes (Signed)
She presents today for follow-up of her nail fungus.  She completed her first 30 days of Lamisil and has had no side effects.  Objective: Vital signs are stable oriented x 3 no change in physical exam.  Assessment: Long-term therapy secondary to onychomycosis.  Plan: Requested comprehensive metabolic panel which she will have done immediately and we will send over another 90 tablets of Lamisil.  Follow-up with her in 4 months.

## 2023-03-24 NOTE — Progress Notes (Unsigned)
@Patient  ID: Deborah Navarro, female    DOB: 1981/01/30, 42 y.o.   MRN: [id number]  Chief Complaint  Patient presents with   Follow-up    Referring provider: Alba Cory, MD  HPI: 42 year old female seen for sleep consult April 21, 2022 for snoring, daytime sleepiness found to have severe obstructive sleep apnea.  Patient has hypersomnia and is Provigil-managed by PCP   TEST/EVENTS :  home sleep study that was completed on April 28, 2022 that showed very severe sleep apnea with a AHI at 40.5/hour.   03/24/2023 Follow up : OSA  Patient returns for a 42-month follow-up.  Patient has severe obstructive sleep apnea is on nocturnal CPAP.  She says she is doing well on CPAP.  She is using it every single night and has perceived benefit.  She does have decreased daytime sleepiness and feels that her sleep is more restful.  She is currently using a nasal mask which seems to be working well for her.  She does have chronic hypersomnia and is on Provigil.  Feels that this does help her as well.  CPAP download shows excellent compliance with 100% usage.  Daily average usage at 9 hours.  Patient is on auto CPAP 5 to 20 cm H2O.  AHI 1.3/hour. Patient does have fibromyalgia and chronic joint pain.  She is on Cymbalta, Lyrica.   No Known Allergies  Immunization History  Administered Date(s) Administered   Influenza Split 02/26/2011   Influenza,inj,Quad PF,6+ Mos 03/15/2015, 03/23/2018, 03/25/2021, 05/25/2022   Influenza-Unspecified 03/23/2019, 05/07/2020   Tdap 03/15/2015    Past Medical History:  Diagnosis Date   Anemia, unspecified    ASD (atrial septal defect)    repaired 1996   Blood transfusion, without reported diagnosis    Depressive disorder, not elsewhere classified    Elevated blood pressure reading without diagnosis of hypertension    Endometriosis of fallopian tube    Family history of adverse reaction to anesthesia    Father - PONV   Irritable bowel syndrome     PONV (postoperative nausea and vomiting)    Streptococcal meningitis    Undiagnosed cardiac murmurs    Unspecified personal history presenting hazards to health    Wears contact lenses     Tobacco History: Social History   Tobacco Use  Smoking Status Never  Smokeless Tobacco Never   Counseling given: Not Answered   Outpatient Medications Prior to Visit  Medication Sig Dispense Refill   cariprazine (VRAYLAR) 1.5 MG capsule TAKE 1 CAPSULE BY MOUTH DAILY 90 capsule 0   Cyanocobalamin (B-12) 1000 MCG SUBL Place 1 tablet under the tongue daily. 30 tablet 0   DULoxetine (CYMBALTA) 60 MG capsule Take 1 capsule (60 mg total) by mouth daily. After the 30 mg dose 90 capsule 1   meloxicam (MOBIC) 15 MG tablet TAKE 1 TABLET(15 MG) BY MOUTH DAILY 90 tablet 1   modafinil (PROVIGIL) 100 MG tablet Take 1 tablet (100 mg total) by mouth daily. 90 tablet 1   Norgestimate-Ethinyl Estradiol Triphasic (ORTHO TRI-CYCLEN LO) 0.18/0.215/0.25 MG-25 MCG tab Take 1 tablet by mouth at bedtime for 28 days. 1 tablet 11   pregabalin (LYRICA) 150 MG capsule Take 1 capsule (150 mg total) by mouth at bedtime. 90 capsule 1   terbinafine (LAMISIL) 250 MG tablet Take 1 tablet (250 mg total) by mouth daily. 30 tablet 0   Vitamin D, Ergocalciferol, (DRISDOL) 1.25 MG (50000 UNIT) CAPS capsule TAKE 1 CAPSULE BY MOUTH EVERY 7 DAYS 12 capsule  1   DULoxetine (CYMBALTA) 30 MG capsule Take 1 capsule (30 mg total) by mouth in the morning. 30 capsule 0   Semaglutide (RYBELSUS) 7 MG TABS Take 1 tablet (7 mg total) by mouth daily. (Patient not taking: Reported on 01/19/2023) 90 tablet 1   No facility-administered medications prior to visit.     Review of Systems:   Constitutional:   No  weight loss, night sweats,  Fevers, chills, fatigue, or  lassitude.  HEENT:   No headaches,  Difficulty swallowing,  Tooth/dental problems, or  Sore throat,                No sneezing, itching, ear ache, nasal congestion, post nasal drip,   CV:   No chest pain,  Orthopnea, PND, swelling in lower extremities, anasarca, dizziness, palpitations, syncope.   GI  No heartburn, indigestion, abdominal pain, nausea, vomiting, diarrhea, change in bowel habits, loss of appetite, bloody stools.   Resp: No shortness of breath with exertion or at rest.  No excess mucus, no productive cough,  No non-productive cough,  No coughing up of blood.  No change in color of mucus.  No wheezing.  No chest wall deformity  Skin: no rash or lesions.  GU: no dysuria, change in color of urine, no urgency or frequency.  No flank pain, no hematuria   MS:  +muscle/joint pain    Physical Exam  BP 130/86 (BP Location: Left Arm, Patient Position: Sitting, Cuff Size: Large)   Pulse 96   Ht 5' 3.5" (1.613 m)   Wt 224 lb 12.8 oz (102 kg)   SpO2 100%   BMI 39.20 kg/m   GEN: A/Ox3; pleasant , NAD, well nourished    HEENT:  [place]/AT,  NOSE-clear, THROAT-clear, no lesions, no postnasal drip or exudate noted.   NECK:  Supple w/ fair ROM; no JVD; normal carotid impulses w/o bruits; no thyromegaly or nodules palpated; no lymphadenopathy.    RESP  Clear  P & A; w/o, wheezes/ rales/ or rhonchi. no accessory muscle use, no dullness to percussion  CARD:  RRR, no m/r/g, no peripheral edema, pulses intact, no cyanosis or clubbing.  GI:   Soft & nt; nml bowel sounds; no organomegaly or masses detected.   Musco: Warm bil, no deformities or joint swelling noted.   Neuro: alert, no focal deficits noted.    Skin: Warm, no lesions or rashes    Lab Results:  CBC   BNP No results found for: "BNP"  ProBNP No results found for: "PROBNP"  Imaging:   Administration History     None           No data to display          No results found for: "NITRICOXIDE"      Assessment & Plan:   No problem-specific Assessment & Plan notes found for this encounter.     Rubye Oaks, NP 03/24/2023

## 2023-03-24 NOTE — Patient Instructions (Addendum)
Continue on CPAP at bedtime.  Goal is to wear all night long for at least 6 or more hours Keep up great work .  Healthy weight loss  Healthy sleep regimen  Do not drive if sleepy  Flu shot today  Follow up in 1 year in [place] office and As needed

## 2023-03-25 ENCOUNTER — Encounter: Payer: Self-pay | Admitting: Family Medicine

## 2023-03-25 DIAGNOSIS — G4733 Obstructive sleep apnea (adult) (pediatric): Secondary | ICD-10-CM | POA: Insufficient documentation

## 2023-03-25 LAB — COMPREHENSIVE METABOLIC PANEL
ALT: 20 [IU]/L (ref 0–32)
AST: 14 [IU]/L (ref 0–40)
Albumin: 4.4 g/dL (ref 3.9–4.9)
Alkaline Phosphatase: 77 [IU]/L (ref 44–121)
BUN/Creatinine Ratio: 11 (ref 9–23)
BUN: 9 mg/dL (ref 6–24)
Bilirubin Total: <0.2 mg/dL (ref 0.0–1.2)
CO2: 24 mmol/L (ref 20–29)
Calcium: 9.2 mg/dL (ref 8.7–10.2)
Chloride: 99 mmol/L (ref 96–106)
Creatinine, Ser: 0.84 mg/dL (ref 0.57–1.00)
Globulin, Total: 2.7 g/dL (ref 1.5–4.5)
Glucose: 96 mg/dL (ref 70–99)
Potassium: 4.8 mmol/L (ref 3.5–5.2)
Sodium: 136 mmol/L (ref 134–144)
Total Protein: 7.1 g/dL (ref 6.0–8.5)
eGFR: 88 mL/min/{1.73_m2} (ref >59)

## 2023-03-25 NOTE — Assessment & Plan Note (Signed)
Continue with healthy weight loss 

## 2023-03-25 NOTE — Assessment & Plan Note (Signed)
Severe obstructive sleep apnea with excellent control on CPAP.  No changes in settings  Plan  Patient Instructions  Continue on CPAP at bedtime.  Goal is to wear all night long for at least 6 or more hours Keep up great work .  Healthy weight loss  Healthy sleep regimen  Do not drive if sleepy  Flu shot today  Follow up in 1 year in [place] office and As needed

## 2023-03-29 ENCOUNTER — Encounter: Payer: Self-pay | Admitting: Physician Assistant

## 2023-03-29 ENCOUNTER — Ambulatory Visit (INDEPENDENT_AMBULATORY_CARE_PROVIDER_SITE_OTHER): Payer: BC Managed Care – PPO | Admitting: Physician Assistant

## 2023-03-29 VITALS — BP 126/80 | HR 97 | Temp 98.0°F | Resp 16 | Ht 63.5 in | Wt 223.1 lb

## 2023-03-29 DIAGNOSIS — R7303 Prediabetes: Secondary | ICD-10-CM | POA: Diagnosis not present

## 2023-03-29 DIAGNOSIS — R5383 Other fatigue: Secondary | ICD-10-CM

## 2023-03-29 DIAGNOSIS — E559 Vitamin D deficiency, unspecified: Secondary | ICD-10-CM | POA: Diagnosis not present

## 2023-03-29 DIAGNOSIS — E538 Deficiency of other specified B group vitamins: Secondary | ICD-10-CM | POA: Diagnosis not present

## 2023-03-29 NOTE — Progress Notes (Signed)
Acute Office Visit   Patient: Deborah Navarro   DOB: [birth year]   42 y.o. Female  MRN: [id number] Visit Date: 03/29/2023  Today's healthcare provider: Oswaldo Conroy Micheala Morissette, PA-C  Introduced myself to the patient as a Secondary school teacher and provided education on APPs in clinical practice.    Chief Complaint  Patient presents with   Fatigue    On set for months, extreme sleep   Subjective    HPI HPI     Fatigue    Additional comments: On set for months, extreme sleep      Last edited by Forde Radon, CMA on 03/29/2023  8:16 AM.      She reports persistent fatigue that has been ongoing since before March She reports her CPAP scores indicate great sleep and good oxygenation  She reports having sweats and feeling very hot every day  She states she has had a headache the last two days along the left temple/top of the head that feels better when laying down  She states she is having trouble working from 7-3. She starts to feel sleepy and drowsy around 2 pm  She has stopped going to functions after 5 pm as she is scared to drive while drowsy    Medications: Outpatient Medications Prior to Visit  Medication Sig   cariprazine (VRAYLAR) 1.5 MG capsule TAKE 1 CAPSULE BY MOUTH DAILY   Cyanocobalamin (B-12) 1000 MCG SUBL Place 1 tablet under the tongue daily.   DULoxetine (CYMBALTA) 60 MG capsule Take 1 capsule (60 mg total) by mouth daily. After the 30 mg dose   meloxicam (MOBIC) 15 MG tablet TAKE 1 TABLET(15 MG) BY MOUTH DAILY   modafinil (PROVIGIL) 100 MG tablet Take 1 tablet (100 mg total) by mouth daily.   Norgestimate-Ethinyl Estradiol Triphasic (ORTHO TRI-CYCLEN LO) 0.18/0.215/0.25 MG-25 MCG tab Take 1 tablet by mouth at bedtime for 28 days.   pregabalin (LYRICA) 150 MG capsule Take 1 capsule (150 mg total) by mouth at bedtime.   terbinafine (LAMISIL) 250 MG tablet Take 1 tablet (250 mg total) by mouth daily.   Vitamin D, Ergocalciferol, (DRISDOL) 1.25 MG (50000 UNIT)  CAPS capsule TAKE 1 CAPSULE BY MOUTH EVERY 7 DAYS   No facility-administered medications prior to visit.    Review of Systems  Constitutional:  Positive for diaphoresis and fatigue.  Eyes:  Negative for visual disturbance.  Respiratory:  Negative for chest tightness, shortness of breath and wheezing.   Cardiovascular:  Negative for palpitations.  Gastrointestinal:  Positive for constipation. Negative for diarrhea, nausea and vomiting.  Skin:  Negative for rash.  Neurological:  Positive for headaches.  Psychiatric/Behavioral:  Negative for dysphoric mood. The patient is not nervous/anxious.         Objective    BP 126/80   Pulse 97   Temp 98 F (36.7 C) (Oral)   Resp 16   Ht 5' 3.5" (1.613 m)   Wt 223 lb 1.6 oz (101.2 kg)   LMP 02/28/2023 (Approximate)   SpO2 99%   BMI 38.90 kg/m     Physical Exam Vitals reviewed.  Constitutional:      General: She is awake.     Appearance: Normal appearance. She is well-developed and well-groomed.  HENT:     Head: Normocephalic and atraumatic.  Pulmonary:     Effort: Pulmonary effort is normal.  Musculoskeletal:     Cervical back: Normal range of motion.  Neurological:     General:  No focal deficit present.     Mental Status: She is alert and oriented to person, place, and time.     GCS: GCS eye subscore is 4. GCS verbal subscore is 5. GCS motor subscore is 6.     Cranial Nerves: No cranial nerve deficit, dysarthria or facial asymmetry.     Gait: Gait is intact.  Psychiatric:        Attention and Perception: Attention and perception normal.        Mood and Affect: Mood and affect normal.        Speech: Speech normal.        Behavior: Behavior normal. Behavior is cooperative.       No results found for any visits on 03/29/23.  Assessment & Plan      No follow-ups on file.       Problem List Items Addressed This Visit   None Visit Diagnoses     Fatigue, unspecified type    -  Primary Chronic, ongoing Patient  reports ongoing fatigue for the past several months along with feeling hot, sweaty. She denies fevers, chills, recent illness or travel Will check CMP, CBC, B12, thiamine, vitamin D, ANA panel, TSH, T4, magnesium, A1c for potential rule out She has a history of positive ANA with low titers. She reports that her CPAP machine reports good control and appropriate oxygenation during sleep Results of labs to dictate further management Follow-up as needed but recommend follow-up in about 4 weeks to discuss further management strategies   Relevant Orders   COMPLETE METABOLIC PANEL WITH GFR   CBC w/Diff/Platelet   B12   Vitamin B1   Vitamin D (25 hydroxy)   ANA,IFA RA Diag Pnl w/rflx Tit/Patn   TSH   T4   Magnesium   HgB A1c        No follow-ups on file.   I, Lleyton Byers E Aunesty Tyson, PA-C, have reviewed all documentation for this visit. The documentation on 03/29/23 for the exam, diagnosis, procedures, and orders are all accurate and complete.   Jacquelin Hawking, MHS, PA-C [facility]

## 2023-04-02 LAB — COMPLETE METABOLIC PANEL WITH GFR
AG Ratio: 1.4 (calc) (ref 1.0–2.5)
ALT: 15 U/L (ref 6–29)
AST: 12 U/L (ref 10–30)
Albumin: 4.2 g/dL (ref 3.6–5.1)
Alkaline phosphatase (APISO): 57 U/L (ref 31–125)
BUN: 14 mg/dL (ref 7–25)
CO2: 28 mmol/L (ref 20–32)
Calcium: 9 mg/dL (ref 8.6–10.2)
Chloride: 101 mmol/L (ref 98–110)
Creat: 0.87 mg/dL (ref 0.50–0.99)
Globulin: 2.9 g/dL (ref 1.9–3.7)
Glucose, Bld: 105 mg/dL — ABNORMAL HIGH (ref 65–99)
Potassium: 4.6 mmol/L (ref 3.5–5.3)
Sodium: 136 mmol/L (ref 135–146)
Total Bilirubin: 0.3 mg/dL (ref 0.2–1.2)
Total Protein: 7.1 g/dL (ref 6.1–8.1)
eGFR: 84 mL/min/{1.73_m2} (ref >=60)

## 2023-04-02 LAB — CBC WITH DIFFERENTIAL/PLATELET
Absolute Lymphocytes: 1833 {cells}/uL (ref 850–3900)
Absolute Monocytes: 289 {cells}/uL (ref 200–950)
Basophils Absolute: 39 {cells}/uL (ref 0–200)
Basophils Relative: 0.8 %
Eosinophils Absolute: 69 {cells}/uL (ref 15–500)
Eosinophils Relative: 1.4 %
HCT: 35.3 % (ref 35.0–45.0)
Hemoglobin: 11.6 g/dL — ABNORMAL LOW (ref 11.7–15.5)
MCH: 29.7 pg (ref 27.0–33.0)
MCHC: 32.9 g/dL (ref 32.0–36.0)
MCV: 90.5 fL (ref 80.0–100.0)
MPV: 10.2 fL (ref 7.5–12.5)
Monocytes Relative: 5.9 %
Neutro Abs: 2671 {cells}/uL (ref 1500–7800)
Neutrophils Relative %: 54.5 %
Platelets: 283 10*3/uL (ref 140–400)
RBC: 3.9 10*6/uL (ref 3.80–5.10)
RDW: 12.5 % (ref 11.0–15.0)
Total Lymphocyte: 37.4 %
WBC: 4.9 10*3/uL (ref 3.8–10.8)

## 2023-04-02 LAB — MAGNESIUM: Magnesium: 2.1 mg/dL (ref 1.5–2.5)

## 2023-04-02 LAB — VITAMIN B12: Vitamin B-12: 648 pg/mL (ref 200–1100)

## 2023-04-02 LAB — ANTI-NUCLEAR AB-TITER (ANA TITER): ANA Titer 1: 1:40 {titer} — ABNORMAL HIGH

## 2023-04-02 LAB — ANA,IFA RA DIAG PNL W/RFLX TIT/PATN
Anti Nuclear Antibody (ANA): POSITIVE — AB
Cyclic Citrullin Peptide Ab: <16 U
Rheumatoid fact SerPl-aCnc: <10 [IU]/mL (ref <14)

## 2023-04-02 LAB — HEMOGLOBIN A1C
Hgb A1c MFr Bld: 6.4 %{Hb} — ABNORMAL HIGH (ref <5.7)
Mean Plasma Glucose: 137 mg/dL
eAG (mmol/L): 7.6 mmol/L

## 2023-04-02 LAB — VITAMIN B1: Vitamin B1 (Thiamine): 9 nmol/L (ref 8–30)

## 2023-04-02 LAB — T4: T4, Total: 10.3 ug/dL (ref 5.1–11.9)

## 2023-04-02 LAB — VITAMIN D 25 HYDROXY (VIT D DEFICIENCY, FRACTURES): Vit D, 25-Hydroxy: 57 ng/mL (ref 30–100)

## 2023-04-02 LAB — TSH: TSH: 2.03 m[IU]/L

## 2023-04-02 NOTE — Progress Notes (Signed)
Your labs are back Your electrolytes, liver and kidney function were overall normal at this time Your CBC was overall normal with the exception of your hemoglobin.  This was very minimally low.  This may correlate with some of your fatigue.  If desired we can refer you to hematology services for further evaluation. Your autoimmune testing shows very low positives for ANA.  The other results appear consistent with your previous testing. Your A1c was 6.4.  This is at the upper threshold of prediabetes.  Medications are not indicated at this time but I strongly suggest dietary changes as well as exercise to help mitigate further progression Your B12 was in normal range Your vitamin D was in normal range Your thyroid testing was normal.  Your magnesium was in normal range.  Your thiamine was in normal range but at the lower level of normal.  I recommend increasing your dietary intake of thiamine to help manage this and improve your levels.  This could also correlate with some fatigue so if supplementing might help with some of your symptoms. Please let us know if you have further questions or concerns

## 2023-04-03 ENCOUNTER — Encounter: Payer: Self-pay | Admitting: Physician Assistant

## 2023-04-03 DIAGNOSIS — E538 Deficiency of other specified B group vitamins: Secondary | ICD-10-CM

## 2023-04-03 DIAGNOSIS — R5382 Chronic fatigue, unspecified: Secondary | ICD-10-CM

## 2023-04-03 DIAGNOSIS — D649 Anemia, unspecified: Secondary | ICD-10-CM

## 2023-04-06 ENCOUNTER — Inpatient Hospital Stay: Payer: BC Managed Care – PPO

## 2023-04-06 ENCOUNTER — Encounter: Payer: Self-pay | Admitting: Internal Medicine

## 2023-04-06 ENCOUNTER — Inpatient Hospital Stay: Payer: BC Managed Care – PPO | Attending: Internal Medicine | Admitting: Internal Medicine

## 2023-04-06 VITALS — BP 124/95 | HR 94 | Temp 100.0°F | Wt 220.0 lb

## 2023-04-06 DIAGNOSIS — R5382 Chronic fatigue, unspecified: Secondary | ICD-10-CM | POA: Insufficient documentation

## 2023-04-06 DIAGNOSIS — E538 Deficiency of other specified B group vitamins: Secondary | ICD-10-CM | POA: Diagnosis not present

## 2023-04-06 DIAGNOSIS — D649 Anemia, unspecified: Secondary | ICD-10-CM | POA: Diagnosis not present

## 2023-04-06 DIAGNOSIS — Z807 Family history of other malignant neoplasms of lymphoid, hematopoietic and related tissues: Secondary | ICD-10-CM | POA: Insufficient documentation

## 2023-04-06 DIAGNOSIS — Z808 Family history of malignant neoplasm of other organs or systems: Secondary | ICD-10-CM | POA: Insufficient documentation

## 2023-04-06 DIAGNOSIS — G4733 Obstructive sleep apnea (adult) (pediatric): Secondary | ICD-10-CM | POA: Insufficient documentation

## 2023-04-06 DIAGNOSIS — Z79899 Other long term (current) drug therapy: Secondary | ICD-10-CM | POA: Diagnosis not present

## 2023-04-06 DIAGNOSIS — Z862 Personal history of diseases of the blood and blood-forming organs and certain disorders involving the immune mechanism: Secondary | ICD-10-CM

## 2023-04-06 DIAGNOSIS — K589 Irritable bowel syndrome without diarrhea: Secondary | ICD-10-CM | POA: Insufficient documentation

## 2023-04-06 LAB — CBC WITH DIFFERENTIAL/PLATELET
Abs Immature Granulocytes: 0.02 10*3/uL (ref 0.00–0.07)
Basophils Absolute: 0.1 10*3/uL (ref 0.0–0.1)
Basophils Relative: 1 %
Eosinophils Absolute: 0 10*3/uL (ref 0.0–0.5)
Eosinophils Relative: 1 %
HCT: 36.2 % (ref 36.0–46.0)
Hemoglobin: 11.9 g/dL — ABNORMAL LOW (ref 12.0–15.0)
Immature Granulocytes: 0 %
Lymphocytes Relative: 38 %
Lymphs Abs: 2.1 10*3/uL (ref 0.7–4.0)
MCH: 29.4 pg (ref 26.0–34.0)
MCHC: 32.9 g/dL (ref 30.0–36.0)
MCV: 89.4 fL (ref 80.0–100.0)
Monocytes Absolute: 0.3 10*3/uL (ref 0.1–1.0)
Monocytes Relative: 6 %
Neutro Abs: 2.9 10*3/uL (ref 1.7–7.7)
Neutrophils Relative %: 54 %
Platelets: 320 10*3/uL (ref 150–400)
RBC: 4.05 MIL/uL (ref 3.87–5.11)
RDW: 12.9 % (ref 11.5–15.5)
WBC: 5.4 10*3/uL (ref 4.0–10.5)
nRBC: 0 % (ref 0.0–0.2)

## 2023-04-06 LAB — IRON AND TIBC
Iron: 77 ug/dL (ref 28–170)
Saturation Ratios: 26 % (ref 10.4–31.8)
TIBC: 291 ug/dL (ref 250–450)
UIBC: 214 ug/dL

## 2023-04-06 LAB — FOLATE: Folate: 19.9 ng/mL (ref >5.9)

## 2023-04-06 LAB — FERRITIN: Ferritin: 82 ng/mL (ref 11–307)

## 2023-04-06 NOTE — Progress Notes (Signed)
[facility]  [phone] [phone]  ID: Deborah Navarro OB: August 29, 1980  MR#: [id number]  AOZ#:[id number]  Patient Care Team: Alba Cory, MD as PCP - General (Family Medicine) Kemper Durie, RN as [facility] Care Management  REFERRING PROVIDER: Jacquelin Hawking, PA-C  REASON FOR REFERRAL: Anemia and vitamin B12 deficiency  HPI: Deborah Navarro is a 42 y.o. female with past medical history of depression,B12 deficiency, fibromyalgia, OSA on CPAP, IBS referred to hematology for workup of anemia.  Patient has history of vitamin B12 deficiency.  She takes oral B12 1000 mcg once daily.  B12 level from 03/29/2027 was 648.  Recent CBC showed hemoglobin of 11.6, normal WBC and platelets.  CMP was unremarkable.  She reports heavy menstrual bleeding on first 2 days of the cycle changes pad 3-4 times a day.  Also had intermittent bleeding.  Had transvaginal ultrasound with multiple nabothian cyst in the cervix.  Has been managed by her primary.  Was started on oral contraception.  No intermittent bleeding since.  Her recent colonoscopy was in 2019 which was normal.  Repeat was recommended in 5 years.  She is scheduled for repeat colonoscopy in December 2024.  Has history of sleep apnea on CPAP. Reports chronic fatigue close to a year. Reports family history of skin cancer in father and CNS lymphoma in 2005 in mother.  Appetite is fair.  But denies any weight loss.   REVIEW OF SYSTEMS:   ROS  As per HPI. Otherwise, a complete review of systems is negative.  PAST MEDICAL HISTORY: Past Medical History:  Diagnosis Date   Anemia, unspecified    ASD (atrial septal defect)    repaired 1996   Blood transfusion, without reported diagnosis    Depressive disorder, not elsewhere classified    Elevated blood pressure reading without diagnosis of hypertension    Endometriosis of fallopian tube    Family history of adverse reaction to  anesthesia    Father - PONV   Irritable bowel syndrome    PONV (postoperative nausea and vomiting)    Streptococcal meningitis    Undiagnosed cardiac murmurs    Unspecified personal history presenting hazards to health    Wears contact lenses     PAST SURGICAL HISTORY: Past Surgical History:  Procedure Laterality Date   COLONOSCOPY WITH PROPOFOL N/A 05/16/2018   Procedure: COLONOSCOPY WITH PROPOFOL;  Surgeon: Midge Minium, MD;  Location: [facility];  Service: Endoscopy;  Laterality: N/A;   DILATION AND CURETTAGE OF UTERUS     LAPAROSCOPIC ENDOMETRIOSIS FULGURATION  10-1999   endometriosis   NECK SURGERY N/A 06/14/15   due to C5/6 C6-7 disc herniation    open heart surgery  12-1994   asd repair    FAMILY HISTORY: Family History  Problem Relation Age of Onset   Cancer Mother 67       non hodgkins lymphoma(mets to brain)   Coronary artery disease Father    Hypertension Father    Hyperlipidemia Father    Diabetes Father    Skin cancer Father    Diabetes Maternal Grandmother    Heart attack Maternal Grandfather    Other Maternal Grandfather        Deterioration of Jaw   Stroke Paternal Grandmother    Stroke Paternal Grandfather    Healthy Son     HEALTH MAINTENANCE: Social History   Tobacco Use   Smoking status: Never   Smokeless tobacco: Never  Vaping Use   Vaping status:  Never Used  Substance Use Topics   Alcohol use: Not Currently   Drug use: No     No Known Allergies  Current Outpatient Medications  Medication Sig Dispense Refill   cariprazine (VRAYLAR) 1.5 MG capsule TAKE 1 CAPSULE BY MOUTH DAILY 90 capsule 0   Cyanocobalamin (B-12) 1000 MCG SUBL Place 1 tablet under the tongue daily. 30 tablet 0   DULoxetine (CYMBALTA) 60 MG capsule Take 1 capsule (60 mg total) by mouth daily. After the 30 mg dose 90 capsule 1   meloxicam (MOBIC) 15 MG tablet TAKE 1 TABLET(15 MG) BY MOUTH DAILY 90 tablet 1   modafinil (PROVIGIL) 100 MG tablet Take 1 tablet (100  mg total) by mouth daily. 90 tablet 1   Norgestimate-Ethinyl Estradiol Triphasic (ORTHO TRI-CYCLEN LO) 0.18/0.215/0.25 MG-25 MCG tab Take 1 tablet by mouth at bedtime for 28 days. 1 tablet 11   pregabalin (LYRICA) 150 MG capsule Take 1 capsule (150 mg total) by mouth at bedtime. 90 capsule 1   terbinafine (LAMISIL) 250 MG tablet Take 1 tablet (250 mg total) by mouth daily. 90 tablet 0   Vitamin D, Ergocalciferol, (DRISDOL) 1.25 MG (50000 UNIT) CAPS capsule TAKE 1 CAPSULE BY MOUTH EVERY 7 DAYS 12 capsule 1   No current facility-administered medications for this visit.    OBJECTIVE: Vitals:   04/06/23 1108  BP: (!) 124/95  Pulse: 94  Temp: 100 F (37.8 C)  SpO2: 97%     Body mass index is 38.36 kg/m.      General: Well-developed, well-nourished, no acute distress. Eyes: Pink conjunctiva, anicteric sclera. HEENT: Normocephalic, moist mucous membranes, clear oropharnyx. Lungs: Clear to auscultation bilaterally. Heart: Regular rate and rhythm. No rubs, murmurs, or gallops. Abdomen: Soft, nontender, nondistended. No organomegaly noted, normoactive bowel sounds. Musculoskeletal: No edema, cyanosis, or clubbing. Neuro: Alert, answering all questions appropriately. Cranial nerves grossly intact. Skin: No rashes or petechiae noted. Psych: Normal affect. Lymphatics: No cervical, calvicular, axillary or inguinal LAD.   LAB RESULTS:  Lab Results  Component Value Date   NA 136 03/29/2023   K 4.6 03/29/2023   CL 101 03/29/2023   CO2 28 03/29/2023   GLUCOSE 105 (H) 03/29/2023   BUN 14 03/29/2023   CREATININE 0.87 03/29/2023   CALCIUM 9.0 03/29/2023   PROT 7.1 03/29/2023   ALBUMIN 4.4 03/24/2023   AST 12 03/29/2023   ALT 15 03/29/2023   ALKPHOS 77 03/24/2023   BILITOT 0.3 03/29/2023   GFRNONAA >60 04/23/2022   GFRAA 87 09/25/2020    Lab Results  Component Value Date   WBC 4.9 03/29/2023   NEUTROABS 2,671 03/29/2023   HGB 11.6 (L) 03/29/2023   HCT 35.3 03/29/2023   MCV 90.5  03/29/2023   PLT 283 03/29/2023    No results found for: "TIBC", "FERRITIN", "IRONPCTSAT"   STUDIES: No results found.  ASSESSMENT AND PLAN:   Deborah Navarro is a 42 y.o. female with pmh of depression,B12 deficiency, fibromyalgia, OSA on CPAP, IBS referred to hematology for workup of anemia.  # History of vitamin B12 deficiency -Continue with oral vitamin B12 1000 mcg daily. - B12 level from 03/29/2027 was 648.  No further workup indicated at this time.  She is responding well to oral supplements.  # Mild anemia -In the past 3 years, her hemoglobin has been between 11.6-12.3.   -I will check iron panel and folate.  Suspicion for hemolysis is low with normal bilirubin.  No concern for plasma dyscrasia so I do not  see an indication to check for myeloma panel. -Patient is up-to-date with screening colonoscopy.  Last colonoscopy was in 2019 which was normal.  She is scheduled for repeat in December 2024.  # OSA on CPAP # Chronic fatigue-ongoing for a year.  Unlikely to be coming from anemia because it is very mild.  TSH normal.  Vitamin D normal.  Has a strenuous job.  Orders Placed This Encounter  Procedures   Iron and TIBC   Ferritin   Folate   CBC with Differential/Platelet   RTC in 2 weeks via video visit to discuss labs.  Patient expressed understanding and was in agreement with this plan. She also understands that She can call clinic at any time with any questions, concerns, or complaints.   I spent a total of 45 minutes reviewing chart data, face-to-face evaluation with the patient, counseling and coordination of care as detailed above.  Michaelyn Barter, MD   04/06/2023 11:16 AM

## 2023-04-11 ENCOUNTER — Other Ambulatory Visit: Payer: Self-pay | Admitting: Family Medicine

## 2023-04-11 DIAGNOSIS — M79671 Pain in right foot: Secondary | ICD-10-CM

## 2023-04-11 DIAGNOSIS — M797 Fibromyalgia: Secondary | ICD-10-CM

## 2023-04-15 ENCOUNTER — Inpatient Hospital Stay: Payer: BC Managed Care – PPO | Admitting: Internal Medicine

## 2023-04-15 DIAGNOSIS — Z862 Personal history of diseases of the blood and blood-forming organs and certain disorders involving the immune mechanism: Secondary | ICD-10-CM | POA: Diagnosis not present

## 2023-04-15 DIAGNOSIS — E538 Deficiency of other specified B group vitamins: Secondary | ICD-10-CM | POA: Diagnosis not present

## 2023-04-15 NOTE — Progress Notes (Signed)
[facility]  Telephone:([phone] [phone]  I connected with Deborah Navarro on 04/15/23 at  3:15 PM EDT by my chart video and verified that I am speaking with the correct person using two identifiers.   I discussed the limitations, risks, security and privacy concerns of performing an evaluation and management service by telemedicine and the availability of in-person appointments. I also discussed with the patient that there may be a patient responsible charge related to this service. The patient expressed understanding and agreed to proceed.   Other persons participating in the visit and their role in the encounter: none   Patient's location: home  Provider's location: clinic   Chief Complaint: discuss labs  ID: [name] OB: February 18, 1981  MR#: [id number]  JSE#:[id number]  Patient Care Team: Alba Cory, MD as PCP - General (Family Medicine) Kemper Durie, RN as [facility] Care Management Michaelyn Barter, MD as Consulting Physician (Oncology)  REFERRING PROVIDER: Jacquelin Hawking, PA-C  REASON FOR REFERRAL: Anemia and vitamin B12 deficiency  HPI: Deborah Navarro is a 42 y.o. female with past medical history of depression,B12 deficiency, fibromyalgia, OSA on CPAP, IBS referred to hematology for workup of anemia.  Patient has history of vitamin B12 deficiency.  She takes oral B12 1000 mcg once daily.  B12 level from 03/29/2027 was 648.  Recent CBC showed hemoglobin of 11.6, normal WBC and platelets.  CMP was unremarkable.  She reports heavy menstrual bleeding on first 2 days of the cycle changes pad 3-4 times a day.  Also had intermittent bleeding.  Had transvaginal ultrasound with multiple nabothian cyst in the cervix.  Has been managed by her primary.  Was started on oral contraception.  No intermittent bleeding since.  Her recent colonoscopy was in 2019 which was normal.  Repeat was recommended in 5 years.  She is  scheduled for repeat colonoscopy in December 2024.  Has history of sleep apnea on CPAP. Reports chronic fatigue close to a year. Reports family history of skin cancer in father and CNS lymphoma in 2005 in mother.  Appetite is fair.  But denies any weight loss.  Interval history Connected with the patient via MyChart video visit to discuss labs. Denies any concerns.  REVIEW OF SYSTEMS:   ROS  As per HPI. Otherwise, a complete review of systems is negative.  PAST MEDICAL HISTORY: Past Medical History:  Diagnosis Date   Anemia, unspecified    ASD (atrial septal defect)    repaired 1996   Blood transfusion, without reported diagnosis    Depressive disorder, not elsewhere classified    Elevated blood pressure reading without diagnosis of hypertension    Endometriosis of fallopian tube    Family history of adverse reaction to anesthesia    Father - PONV   Irritable bowel syndrome    PONV (postoperative nausea and vomiting)    Streptococcal meningitis    Undiagnosed cardiac murmurs    Unspecified personal history presenting hazards to health    Wears contact lenses     PAST SURGICAL HISTORY: Past Surgical History:  Procedure Laterality Date   COLONOSCOPY WITH PROPOFOL N/A 05/16/2018   Procedure: COLONOSCOPY WITH PROPOFOL;  Surgeon: Midge Minium, MD;  Location: [facility];  Service: Endoscopy;  Laterality: N/A;   DILATION AND CURETTAGE OF UTERUS     LAPAROSCOPIC ENDOMETRIOSIS FULGURATION  10-1999   endometriosis   NECK SURGERY N/A 06/14/15   due to C5/6 C6-7 disc herniation    open heart surgery  [id number]   asd repair    FAMILY HISTORY: Family History  Problem Relation Age of Onset   Cancer Mother 65       non hodgkins lymphoma(mets to brain)   Coronary artery disease Father    Hypertension Father    Hyperlipidemia Father    Diabetes Father    Skin cancer Father    Diabetes Maternal Grandmother    Heart attack Maternal Grandfather    Other Maternal  Grandfather        Deterioration of Jaw   Stroke Paternal Grandmother    Stroke Paternal Grandfather    Healthy Son     HEALTH MAINTENANCE: Social History   Tobacco Use   Smoking status: Never   Smokeless tobacco: Never  Vaping Use   Vaping status: Never Used  Substance Use Topics   Alcohol use: Not Currently   Drug use: No     No Known Allergies  Current Outpatient Medications  Medication Sig Dispense Refill   cariprazine (VRAYLAR) 1.5 MG capsule TAKE 1 CAPSULE BY MOUTH DAILY 90 capsule 0   Cyanocobalamin (B-12) 1000 MCG SUBL Place 1 tablet under the tongue daily. 30 tablet 0   DULoxetine (CYMBALTA) 60 MG capsule Take 1 capsule (60 mg total) by mouth daily. After the 30 mg dose 90 capsule 1   meloxicam (MOBIC) 15 MG tablet TAKE 1 TABLET(15 MG) BY MOUTH DAILY 90 tablet 0   modafinil (PROVIGIL) 100 MG tablet Take 1 tablet (100 mg total) by mouth daily. 90 tablet 1   Norgestimate-Ethinyl Estradiol Triphasic (ORTHO TRI-CYCLEN LO) 0.18/0.215/0.25 MG-25 MCG tab Take 1 tablet by mouth at bedtime for 28 days. 1 tablet 11   pregabalin (LYRICA) 150 MG capsule Take 1 capsule (150 mg total) by mouth at bedtime. 90 capsule 1   terbinafine (LAMISIL) 250 MG tablet Take 1 tablet (250 mg total) by mouth daily. 90 tablet 0   Vitamin D, Ergocalciferol, (DRISDOL) 1.25 MG (50000 UNIT) CAPS capsule TAKE 1 CAPSULE BY MOUTH EVERY 7 DAYS 12 capsule 1   No current facility-administered medications for this visit.    OBJECTIVE: There were no vitals filed for this visit.    There is no height or weight on file to calculate BMI.      Physical exam not performed.  Virtual visit.   LAB RESULTS:  Lab Results  Component Value Date   NA 136 03/29/2023   K 4.6 03/29/2023   CL 101 03/29/2023   CO2 28 03/29/2023   GLUCOSE 105 (H) 03/29/2023   BUN 14 03/29/2023   CREATININE 0.87 03/29/2023   CALCIUM 9.0 03/29/2023   PROT 7.1 03/29/2023   ALBUMIN 4.4 03/24/2023   AST 12 03/29/2023   ALT 15  03/29/2023   ALKPHOS 77 03/24/2023   BILITOT 0.3 03/29/2023   GFRNONAA >60 04/23/2022   GFRAA 87 09/25/2020    Lab Results  Component Value Date   WBC 5.4 04/06/2023   NEUTROABS 2.9 04/06/2023   HGB 11.9 (L) 04/06/2023   HCT 36.2 04/06/2023   MCV 89.4 04/06/2023   PLT 320 04/06/2023    Lab Results  Component Value Date   TIBC 291 04/06/2023   FERRITIN 82 04/06/2023   IRONPCTSAT 26 04/06/2023     STUDIES: No results found.  ASSESSMENT AND PLAN:   LILLYIN QUINE is a 42 y.o. female with pmh of depression,B12 deficiency, fibromyalgia, OSA on CPAP, IBS referred to hematology for workup of anemia.  # History of vitamin B12 deficiency -Continue  with oral vitamin B12 1000 mcg daily. - B12 level from 03/29/2027 was 648.  No further workup indicated at this time.  She is responding well to oral supplements.  # Mild anemia -In the past 3 years, her hemoglobin has been between 11.6-12.3.  Repeat CBC showed hemoglobin of 11.9.  Iron panel is normal.  Folate is normal. Suspicion for hemolysis is low with normal bilirubin.  No further workup needed at this time. -Patient is up-to-date with screening colonoscopy.  Last colonoscopy was in 2019 which was normal.  She is scheduled for repeat in December 2024. -Patient will continue to follow-up with her primary.  She can be referred back to hematology if anything changes with lab work.  # OSA on CPAP # Chronic fatigue-ongoing for a year.  Unlikely to be coming from anemia because it is very mild.  TSH normal.  Vitamin D normal.  Has a strenuous job.  No orders of the defined types were placed in this encounter.  RTC as needed  Patient expressed understanding and was in agreement with this plan. She also understands that She can call clinic at any time with any questions, concerns, or complaints.   I spent a total of 25 minutes reviewing chart data, face-to-face evaluation with the patient, counseling and coordination of care as  detailed above.  Michaelyn Barter, MD   04/15/2023 3:41 PM

## 2023-04-16 ENCOUNTER — Telehealth: Payer: BC Managed Care – PPO | Admitting: Internal Medicine

## 2023-04-28 ENCOUNTER — Other Ambulatory Visit: Payer: Self-pay | Admitting: Family Medicine

## 2023-04-28 DIAGNOSIS — Z1231 Encounter for screening mammogram for malignant neoplasm of breast: Secondary | ICD-10-CM

## 2023-05-11 ENCOUNTER — Encounter: Payer: Self-pay | Admitting: Gastroenterology

## 2023-05-20 ENCOUNTER — Ambulatory Visit
Admission: RE | Admit: 2023-05-20 | Discharge: 2023-05-20 | Disposition: A | Discharge status: Home / Self Care | Payer: BC Managed Care – PPO | Source: Ambulatory Visit | Attending: Gastroenterology | Admitting: Gastroenterology

## 2023-05-20 ENCOUNTER — Ambulatory Visit: Payer: BC Managed Care – PPO | Admitting: Registered Nurse

## 2023-05-20 ENCOUNTER — Encounter: Payer: Self-pay | Admitting: Gastroenterology

## 2023-05-20 ENCOUNTER — Other Ambulatory Visit: Payer: Self-pay

## 2023-05-20 ENCOUNTER — Encounter
Admission: RE | Disposition: A | Discharge status: Home / Self Care | Payer: Self-pay | Source: Ambulatory Visit | Attending: Gastroenterology

## 2023-05-20 DIAGNOSIS — F32A Depression, unspecified: Secondary | ICD-10-CM | POA: Diagnosis not present

## 2023-05-20 DIAGNOSIS — Z8774 Personal history of (corrected) congenital malformations of heart and circulatory system: Secondary | ICD-10-CM | POA: Insufficient documentation

## 2023-05-20 DIAGNOSIS — Z860100 Personal history of colon polyps, unspecified: Secondary | ICD-10-CM

## 2023-05-20 DIAGNOSIS — Z1211 Encounter for screening for malignant neoplasm of colon: Secondary | ICD-10-CM | POA: Diagnosis not present

## 2023-05-20 DIAGNOSIS — K640 First degree hemorrhoids: Secondary | ICD-10-CM | POA: Insufficient documentation

## 2023-05-20 DIAGNOSIS — Z860101 Personal history of adenomatous and serrated colon polyps: Secondary | ICD-10-CM | POA: Diagnosis not present

## 2023-05-20 HISTORY — PX: COLONOSCOPY WITH PROPOFOL: SHX5780

## 2023-05-20 LAB — POCT PREGNANCY, URINE: Preg Test, Ur: NEGATIVE

## 2023-05-20 SURGERY — COLONOSCOPY WITH PROPOFOL
Anesthesia: General

## 2023-05-20 MED ORDER — PROPOFOL 500 MG/50ML IV EMUL
INTRAVENOUS | Status: DC | PRN
  Administered 2023-05-20: 140 ug/kg/min via INTRAVENOUS

## 2023-05-20 MED ORDER — SODIUM CHLORIDE 0.9 % IV SOLN: INTRAVENOUS | Status: DC

## 2023-05-20 MED ORDER — PROPOFOL 10 MG/ML IV BOLUS
INTRAVENOUS | Status: DC | PRN
  Administered 2023-05-20: 80 mg via INTRAVENOUS

## 2023-05-20 MED ORDER — LIDOCAINE HCL (CARDIAC) PF 100 MG/5ML IV SOSY
PREFILLED_SYRINGE | INTRAVENOUS | Status: DC | PRN
  Administered 2023-05-20: 60 mg via INTRAVENOUS

## 2023-05-20 NOTE — Anesthesia Postprocedure Evaluation (Signed)
Anesthesia Post Note  Patient: Deborah Navarro  Procedure(s) Performed: COLONOSCOPY WITH PROPOFOL  Patient location during evaluation: PACU Anesthesia Type: General Level of consciousness: awake and alert Pain management: pain level controlled Vital Signs Assessment: post-procedure vital signs reviewed and stable Respiratory status: spontaneous breathing, nonlabored ventilation, respiratory function stable and patient connected to nasal cannula oxygen Cardiovascular status: blood pressure returned to baseline and stable Postop Assessment: no apparent nausea or vomiting Anesthetic complications: no  No notable events documented.   Last Vitals:  Vitals:   05/20/23 0811 05/20/23 0821  BP: 122/73 115/72  Pulse: 80 79  Resp: (!) 21 16  Temp: (!) 36.2 C   SpO2: 100% 100%    Last Pain:  Vitals:   05/20/23 0821  TempSrc:   PainSc: 0-No pain                 Stephanie Coup

## 2023-05-20 NOTE — Op Note (Signed)
[facility] Gastroenterology Patient Name: Deborah Navarro Procedure Date: 05/20/2023 7:50 AM MRN: [id number] Account #: [id number] Date of Birth: March 28, 1981 Admit Type: Outpatient Age: 42 Room: [facility] ENDO ROOM 4 Gender: Female Note Status: Finalized Instrument Name: Prentice Docker [id number] Procedure:             Colonoscopy Indications:           High risk colon cancer surveillance: Personal history                         of colonic polyps Providers:             Midge Minium MD, MD Referring MD:          Onnie Boer. Sowles, MD (Referring MD) Medicines:             Propofol per Anesthesia Complications:         No immediate complications. Procedure:             Pre-Anesthesia Assessment:                        - Prior to the procedure, a History and Physical was                         performed, and patient medications and allergies were                         reviewed. The patient's tolerance of previous                         anesthesia was also reviewed. The risks and benefits                         of the procedure and the sedation options and risks                         were discussed with the patient. All questions were                         answered, and informed consent was obtained. Prior                         Anticoagulants: The patient has taken no anticoagulant                         or antiplatelet agents. ASA Grade Assessment: II - A                         patient with mild systemic disease. After reviewing                         the risks and benefits, the patient was deemed in                         satisfactory condition to undergo the procedure.                        After obtaining informed consent, the colonoscope was  passed under direct vision. Throughout the procedure,                         the patient's blood pressure, pulse, and oxygen                         saturations were monitored continuously. The                          Colonoscope was introduced through the anus and                         advanced to the the cecum, identified by appendiceal                         orifice and ileocecal valve. The colonoscopy was                         performed without difficulty. The patient tolerated                         the procedure well. The quality of the bowel                         preparation was excellent. Findings:      The perianal and digital rectal examinations were normal.      Non-bleeding internal hemorrhoids were found during retroflexion. The       hemorrhoids were Grade I (internal hemorrhoids that do not prolapse). Impression:            - Non-bleeding internal hemorrhoids.                        - No specimens collected. Recommendation:        - Discharge patient to home.                        - Resume previous diet.                        - Continue present medications.                        - Repeat colonoscopy in 7 years for surveillance. Procedure Code(s):     --- Professional ---                        [phone], Colonoscopy, flexible; diagnostic, including                         collection of specimen(s) by brushing or washing, when                         performed (separate procedure) Diagnosis Code(s):     --- Professional ---                        Z86.010, Personal history of colonic polyps CPT copyright 2022 [employer]. All rights reserved. The codes documented in this report are preliminary and upon coder review may  be revised to meet current compliance requirements. Gahel Safley  Kohan Azizi MD, MD 05/20/2023 8:09:25 AM This report has been signed electronically. Number of Addenda: 0 Note Initiated On: 05/20/2023 7:50 AM Scope Withdrawal Time: 0 hours 8 minutes 6 seconds  Total Procedure Duration: 0 hours 11 minutes 50 seconds  Estimated Blood Loss:  Estimated blood loss: none.      [facility]

## 2023-05-20 NOTE — Anesthesia Preprocedure Evaluation (Signed)
Anesthesia Evaluation  Patient identified by MRN, date of birth, ID band Patient awake    Reviewed: Allergy & Precautions, NPO status , Patient's Chart, lab work & pertinent test results  History of Anesthesia Complications (+) PONV and history of anesthetic complications  Airway Mallampati: I  TM Distance: >3 FB Neck ROM: Full    Dental no notable dental hx.    Pulmonary neg pulmonary ROS   Pulmonary exam normal breath sounds clear to auscultation       Cardiovascular Exercise Tolerance: Good Normal cardiovascular exam+ Valvular Problems/Murmurs (ASD s/p repair 1996)  Rhythm:Regular Rate:Normal     Neuro/Psych  PSYCHIATRIC DISORDERS Anxiety Depression    negative neurological ROS     GI/Hepatic IBS   Endo/Other  negative endocrine ROS    Renal/GU negative Renal ROS     Musculoskeletal   Abdominal   Peds  Hematology  (+) Blood dyscrasia, anemia   Anesthesia Other Findings   Reproductive/Obstetrics                              Anesthesia Physical Anesthesia Plan  ASA: II  Anesthesia Plan: General   Post-op Pain Management:    Induction: Intravenous  PONV Risk Score and Plan: 3 and Propofol infusion and TIVA  Airway Management Planned: Natural Airway  Additional Equipment:   Intra-op Plan:   Post-operative Plan:   Informed Consent: I have reviewed the patients History and Physical, chart, labs and discussed the procedure including the risks, benefits and alternatives for the proposed anesthesia with the patient or authorized representative who has indicated his/her understanding and acceptance.       Plan Discussed with: CRNA  Anesthesia Plan Comments:          Anesthesia Quick Evaluation

## 2023-05-20 NOTE — Transfer of Care (Signed)
Immediate Anesthesia Transfer of Care Note  Patient: Gwen Pounds Severino  Procedure(s) Performed: COLONOSCOPY WITH PROPOFOL  Patient Location: PACU  Anesthesia Type:General  Level of Consciousness: awake, alert , and oriented  Airway & Oxygen Therapy: Patient Spontanous Breathing  Post-op Assessment: Report given to RN and Post -op Vital signs reviewed and stable  Post vital signs: Reviewed and stable  Last Vitals:  Vitals Value Taken Time  BP 122/73 05/20/23 0811  Temp 36.2 C 05/20/23 0811  Pulse 80 05/20/23 0811  Resp 21 05/20/23 0811  SpO2 100 % 05/20/23 0811  Vitals shown include unfiled device data.  Last Pain:  Vitals:   05/20/23 0811  TempSrc: Temporal  PainSc:          Complications: No notable events documented.
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
## 2023-05-20 NOTE — H&P (Signed)
Deborah Minium, MD [facility] [address] [place], [place] [address] [phone] Fax : [phone]  Primary Care Physician:  Alba Cory, MD Primary Gastroenterologist:  Dr. Servando Snare  Pre-Procedure History & Physical: HPI:  AILYNN RENNINGER is a 42 y.o. female is here for an colonoscopy.   Past Medical History:  Diagnosis Date   Anemia, unspecified    ASD (atrial septal defect)    repaired 1996   Blood transfusion, without reported diagnosis    Depressive disorder, not elsewhere classified    Elevated blood pressure reading without diagnosis of hypertension    Endometriosis of fallopian tube    Family history of adverse reaction to anesthesia    Father - PONV   Irritable bowel syndrome    PONV (postoperative nausea and vomiting)    Streptococcal meningitis    Undiagnosed cardiac murmurs    Unspecified personal history presenting hazards to health    Wears contact lenses     Past Surgical History:  Procedure Laterality Date   COLONOSCOPY WITH PROPOFOL N/A 05/16/2018   Procedure: COLONOSCOPY WITH PROPOFOL;  Surgeon: Deborah Minium, MD;  Location: [facility];  Service: Endoscopy;  Laterality: N/A;   DILATION AND CURETTAGE OF UTERUS     LAPAROSCOPIC ENDOMETRIOSIS FULGURATION  10-1999   endometriosis   NECK SURGERY N/A 06/14/15   due to C5/6 C6-7 disc herniation    open heart surgery  12-1994   asd repair    Prior to Admission medications   Medication Sig Start Date End Date Taking? Authorizing Provider  cariprazine (VRAYLAR) 1.5 MG capsule TAKE 1 CAPSULE BY MOUTH DAILY 03/01/23  Yes Sowles, Danna Hefty, MD  Cyanocobalamin (B-12) 1000 MCG SUBL Place 1 tablet under the tongue daily. 12/21/18  Yes Sowles, Danna Hefty, MD  DULoxetine (CYMBALTA) 60 MG capsule Take 1 capsule (60 mg total) by mouth daily. After the 30 mg dose 12/21/22  Yes Sowles, Danna Hefty, MD  meloxicam (MOBIC) 15 MG tablet TAKE 1 TABLET(15 MG) BY MOUTH DAILY 04/13/23  Yes Sowles, Danna Hefty, MD  modafinil  (PROVIGIL) 100 MG tablet Take 1 tablet (100 mg total) by mouth daily. 12/21/22  Yes Sowles, Danna Hefty, MD  pregabalin (LYRICA) 150 MG capsule Take 1 capsule (150 mg total) by mouth at bedtime. 12/21/22  Yes Sowles, Danna Hefty, MD  terbinafine (LAMISIL) 250 MG tablet Take 1 tablet (250 mg total) by mouth daily. 03/24/23  Yes Hyatt, Max T, DPM  Vitamin D, Ergocalciferol, (DRISDOL) 1.25 MG (50000 UNIT) CAPS capsule TAKE 1 CAPSULE BY MOUTH EVERY 7 DAYS 10/11/22  Yes Sowles, Danna Hefty, MD  Norgestimate-Ethinyl Estradiol Triphasic (ORTHO TRI-CYCLEN LO) 0.18/0.215/0.25 MG-25 MCG tab Take 1 tablet by mouth at bedtime for 28 days. 03/11/23 04/08/23  Free, Lindalou Hose, CNM    Allergies as of 02/17/2023   (No Known Allergies)    Family History  Problem Relation Age of Onset   Cancer Mother 17       non hodgkins lymphoma(mets to brain)   Coronary artery disease Father    Hypertension Father    Hyperlipidemia Father    Diabetes Father    Skin cancer Father    Diabetes Maternal Grandmother    Heart attack Maternal Grandfather    Other Maternal Grandfather        Deterioration of Jaw   Stroke Paternal Grandmother    Stroke Paternal Grandfather    Healthy Son     Social History   Socioeconomic History   Marital status: Married    Spouse name: Asher Muir  Number of children: 1   Years of education: Not on file   Highest education level: Some college, no degree  Occupational History   Occupation: Aeronautical engineer    Employer: [employer]  Tobacco Use   Smoking status: Never   Smokeless tobacco: Never  Vaping Use   Vaping status: Never Used  Substance and Sexual Activity   Alcohol use: Not Currently   Drug use: No   Sexual activity: Yes    Partners: Male    Birth control/protection: None  Other Topics Concern   Not on file  Social History Narrative   Working 3 jobs currently   Social Determinants of Corporate investment banker Strain: Medium Risk (03/28/2023)   Overall Financial Resource  Strain (CARDIA)    Difficulty of Paying Living Expenses: Somewhat hard  Food Insecurity: Food Insecurity Present (03/28/2023)   Hunger Vital Sign    Worried About Running Out of Food in the Last Year: Sometimes true    Ran Out of Food in the Last Year: Never true  Transportation Needs: No Transportation Needs (03/28/2023)   PRAPARE - Administrator, Civil Service (Medical): No    Lack of Transportation (Non-Medical): No  Physical Activity: Unknown (03/28/2023)   Exercise Vital Sign    Days of Exercise per Week: 0 days    Minutes of Exercise per Session: Not on file  Stress: No Stress Concern Present (03/28/2023)   [employer] of Occupational Health - Occupational Stress Questionnaire    Feeling of Stress : Only a little  Social Connections: Moderately Integrated (03/28/2023)   Social Connection and Isolation Panel [NHANES]    Frequency of Communication with Friends and Family: More than three times a week    Frequency of Social Gatherings with Friends and Family: Once a week    Attends Religious Services: More than 4 times per year    Active Member of [employer] or Organizations: No    Attends Banker Meetings: Not on file    Marital Status: Married  Catering manager Violence: Not At Risk (03/03/2022)   Humiliation, Afraid, Rape, and Kick questionnaire    Fear of Current or Ex-Partner: No    Emotionally Abused: No    Physically Abused: No    Sexually Abused: No    Review of Systems: See HPI, otherwise negative ROS  Physical Exam: BP (!) 150/85   Pulse 84   Temp (!) 96.5 F (35.8 C) (Temporal)   Resp 14   Ht 5' 3.5" (1.613 m)   Wt 99.2 kg   LMP 05/06/2023   SpO2 100%   BMI 38.12 kg/m  General:   Alert,  pleasant and cooperative in NAD Head:  Normocephalic and atraumatic. Neck:  Supple; no masses or thyromegaly. Lungs:  Clear throughout to auscultation.    Heart:  Regular rate and rhythm. Abdomen:  Soft, nontender and nondistended. Normal  bowel sounds, without guarding, and without rebound.   Neurologic:  Alert and  oriented x4;  grossly normal neurologically.  Impression/Plan: ROSELYNE GUNN is here for an colonoscopy to be performed for a history of adenomatous polyps on 2019   Risks, benefits, limitations, and alternatives regarding  colonoscopy have been reviewed with the patient.  Questions have been answered.  All parties agreeable.   Deborah Minium, MD  05/20/2023, 7:45 AM

## 2023-05-21 ENCOUNTER — Encounter: Payer: Self-pay | Admitting: Gastroenterology

## 2023-05-31 ENCOUNTER — Ambulatory Visit (INDEPENDENT_AMBULATORY_CARE_PROVIDER_SITE_OTHER): Payer: BC Managed Care – PPO

## 2023-05-31 ENCOUNTER — Other Ambulatory Visit (HOSPITAL_COMMUNITY)
Admission: RE | Admit: 2023-05-31 | Discharge: 2023-05-31 | Disposition: A | Discharge status: Home / Self Care | Payer: BC Managed Care – PPO | Source: Ambulatory Visit

## 2023-05-31 ENCOUNTER — Ambulatory Visit
Admission: RE | Admit: 2023-05-31 | Discharge: 2023-05-31 | Disposition: A | Discharge status: Home / Self Care | Payer: BC Managed Care – PPO | Source: Ambulatory Visit | Attending: Family Medicine | Admitting: Family Medicine

## 2023-05-31 ENCOUNTER — Ambulatory Visit: Payer: BC Managed Care – PPO | Admitting: Family Medicine

## 2023-05-31 VITALS — BP 126/80 | HR 83 | Wt 221.6 lb

## 2023-05-31 DIAGNOSIS — Z01419 Encounter for gynecological examination (general) (routine) without abnormal findings: Secondary | ICD-10-CM | POA: Insufficient documentation

## 2023-05-31 DIAGNOSIS — R7303 Prediabetes: Secondary | ICD-10-CM

## 2023-05-31 DIAGNOSIS — E785 Hyperlipidemia, unspecified: Secondary | ICD-10-CM

## 2023-05-31 DIAGNOSIS — F39 Unspecified mood [affective] disorder: Secondary | ICD-10-CM | POA: Diagnosis not present

## 2023-05-31 DIAGNOSIS — G4733 Obstructive sleep apnea (adult) (pediatric): Secondary | ICD-10-CM | POA: Diagnosis not present

## 2023-05-31 DIAGNOSIS — Z1231 Encounter for screening mammogram for malignant neoplasm of breast: Secondary | ICD-10-CM | POA: Insufficient documentation

## 2023-05-31 DIAGNOSIS — N926 Irregular menstruation, unspecified: Secondary | ICD-10-CM

## 2023-05-31 DIAGNOSIS — R5382 Chronic fatigue, unspecified: Secondary | ICD-10-CM | POA: Diagnosis not present

## 2023-05-31 DIAGNOSIS — G629 Polyneuropathy, unspecified: Secondary | ICD-10-CM

## 2023-05-31 DIAGNOSIS — Z0000 Encounter for general adult medical examination without abnormal findings: Secondary | ICD-10-CM

## 2023-05-31 DIAGNOSIS — M792 Neuralgia and neuritis, unspecified: Secondary | ICD-10-CM

## 2023-05-31 DIAGNOSIS — D649 Anemia, unspecified: Secondary | ICD-10-CM

## 2023-05-31 DIAGNOSIS — M797 Fibromyalgia: Secondary | ICD-10-CM

## 2023-05-31 LAB — POCT GLYCOSYLATED HEMOGLOBIN (HGB A1C): Hemoglobin A1C: 6.5 % — AB (ref 4.0–5.6)

## 2023-05-31 MED ORDER — MODAFINIL 100 MG PO TABS: 100.0000 mg | ORAL_TABLET | Freq: Every day | ORAL | 1 refills | Status: DC

## 2023-05-31 MED ORDER — PREGABALIN 100 MG PO CAPS: 100.0000 mg | ORAL_CAPSULE | Freq: Every evening | ORAL | 0 refills | Status: DC | PRN

## 2023-05-31 MED ORDER — CARIPRAZINE HCL 1.5 MG PO CAPS: 1.5000 mg | ORAL_CAPSULE | Freq: Every day | ORAL | 1 refills | Status: DC

## 2023-05-31 MED ORDER — NORGESTIM-ETH ESTRAD TRIPHASIC 0.18/0.215/0.25 MG-25 MCG PO TABS: 1.0000 | ORAL_TABLET | Freq: Every day | ORAL | 11 refills | Status: DC

## 2023-05-31 MED ORDER — METFORMIN HCL ER 500 MG PO TB24: 500.0000 mg | ORAL_TABLET | Freq: Every day | ORAL | 0 refills | Status: DC

## 2023-05-31 MED ORDER — DULOXETINE HCL 60 MG PO CPEP: 60.0000 mg | ORAL_CAPSULE | Freq: Every day | ORAL | 1 refills | Status: DC

## 2023-05-31 NOTE — Progress Notes (Signed)
Outpatient Gynecology Note: Annual Visit  Assessment/Plan:    Deborah Navarro is a 42 y.o. female G2P0011 with normal well-woman gynecologic exam.   Well woman exam - Reviewed health maintenance topics as documented below. - Most recent pap smear in 2023, NILM, reviewed ASCCP guidelines and rational. Desires repeat pap smear today given history of abnormal pap smears. - Recommended calcium supplement for bone health.  - Mammogram UTD.  - Satisfied with current contraception. Rx renewed. - STI screening declined.  - Follows with PCP for other health maintenance screenings and labs.      No orders of the defined types were placed in this encounter.  Current Outpatient Medications  Medication Instructions   cariprazine (VRAYLAR) 1.5 mg, Oral, Daily   Cyanocobalamin (B-12) 1000 MCG SUBL 1 tablet, Sublingual, Daily   DULoxetine (CYMBALTA) 60 mg, Oral, Daily, After the 30 mg dose   meloxicam (MOBIC) 15 MG tablet TAKE 1 TABLET(15 MG) BY MOUTH DAILY   metFORMIN (GLUCOPHAGE-XR) 500 mg, Oral, Daily with breakfast   modafinil (PROVIGIL) 100 mg, Oral, Daily   Norgestim-Eth Estrad Triphasic (NORGESTIMATE-ETHINYL ESTRADIOL TRIPHASIC) 0.18/0.215/0.25 MG-25 MCG tab 1 tablet, Oral, Daily at bedtime   pregabalin (LYRICA) 100 mg, Oral, At bedtime PRN   Vitamin D (Ergocalciferol) (DRISDOL) 50,000 Units, Oral, Every 7 days    No follow-ups on file.    Subjective:    Deborah Navarro is a 42 y.o. female G2P0011 who presents for annual wellness visit.   Occupation Actor    Lives with husband    CONCERNS? none  Well Woman Visit:  GYN HISTORY:  Patient's last menstrual period was 05/06/2023.     Menstrual History: OB History     Gravida  2   Para      Term      Preterm      AB  1   Living  1      SAB  1   IAB      Ectopic      Multiple      Live Births  1        Obstetric Comments  One child born 2002 had a miscarriage in 2009          Menarche age: 46 Patient's last menstrual period was 05/06/2023. Period Pattern: Regular   Intermenstrual bleeding, spotting, or discharge? none Urinary incontinence? Yes, has been seen by urologist  Sexually active: no Last pap:was normal  History of abnormal Pap: yes, LSIL in 2022 Gardasil series:  no STI history: no STI/HIV testing or immunizations needed? No.  Contraceptive methods:  Ortho Tricyclen  Health Maintenance > Reviewed breast self-awareness, no family history of breast cancer > History of abnormal mammogram: No > Exercise:  on feet at work , moderately active > Dietary Supplements: tumeric, B12, and Vit D > Body mass index is 38.64 kg/m.  > Recent dental visit Yes.   > Seat Belt Use: Yes.   > Texting and driving? No. > Guns in the house Yes.  , locked > Concern for alcohol abuse? none   Tobacco or other drug use: denied. Tobacco Use: Low Risk  (05/31/2023)   Patient History    Smoking Tobacco Use: Never    Smokeless Tobacco Use: Never    Passive Exposure: Not on file     PHQ-2 Score: In last two weeks, how often have you felt: Little interest or pleasure in doing things: Not at all (0) Feeling down, depressed or hopeless:  Not at all (0) Score: 0  GAD-2 Over the last 2 weeks, how often have you been bothered by the following problems? Feeling nervous, anxious or on edge: Several days (+1) Not being able to stop or control worrying: Several days (+1)} Score: 2  Feels anxiety is mostly related to holidays and transition to new role at work.   If >40:  Last mammogram:  today Age at menopause: n/a Last colonoscopy: last week     _________________________________________________________  Current Outpatient Medications  Medication Sig Dispense Refill   cariprazine (VRAYLAR) 1.5 MG capsule Take 1 capsule (1.5 mg total) by mouth daily. 90 capsule 1   Cyanocobalamin (B-12) 1000 MCG SUBL Place 1 tablet under the tongue daily. 30 tablet 0    DULoxetine (CYMBALTA) 60 MG capsule Take 1 capsule (60 mg total) by mouth daily. After the 30 mg dose 90 capsule 1   meloxicam (MOBIC) 15 MG tablet TAKE 1 TABLET(15 MG) BY MOUTH DAILY 90 tablet 0   metFORMIN (GLUCOPHAGE-XR) 500 MG 24 hr tablet Take 1 tablet (500 mg total) by mouth daily with breakfast. 90 tablet 0   modafinil (PROVIGIL) 100 MG tablet Take 1 tablet (100 mg total) by mouth daily. 90 tablet 1   Norgestim-Eth Estrad Triphasic (NORGESTIMATE-ETHINYL ESTRADIOL TRIPHASIC) 0.18/0.215/0.25 MG-25 MCG tab Take 1 tablet by mouth at bedtime for 28 days. 1 tablet 11   pregabalin (LYRICA) 100 MG capsule Take 1 capsule (100 mg total) by mouth at bedtime as needed. 90 capsule 0   Vitamin D, Ergocalciferol, (DRISDOL) 1.25 MG (50000 UNIT) CAPS capsule TAKE 1 CAPSULE BY MOUTH EVERY 7 DAYS 12 capsule 1   No current facility-administered medications for this visit.   No Known Allergies  Past Medical History:  Diagnosis Date   Anemia, unspecified    ASD (atrial septal defect)    repaired 1996   Blood transfusion, without reported diagnosis    Depressive disorder, not elsewhere classified    Elevated blood pressure reading without diagnosis of hypertension    Endometriosis of fallopian tube    Family history of adverse reaction to anesthesia    Father - PONV   Irritable bowel syndrome    PONV (postoperative nausea and vomiting)    Streptococcal meningitis    Undiagnosed cardiac murmurs    Unspecified personal history presenting hazards to health    Wears contact lenses    Past Surgical History:  Procedure Laterality Date   COLONOSCOPY WITH PROPOFOL N/A 05/16/2018   Procedure: COLONOSCOPY WITH PROPOFOL;  Surgeon: Midge Minium, MD;  Location: [facility];  Service: Endoscopy;  Laterality: N/A;   COLONOSCOPY WITH PROPOFOL N/A 05/20/2023   Procedure: COLONOSCOPY WITH PROPOFOL;  Surgeon: Midge Minium, MD;  Location[facility];  Service: Endoscopy;  Laterality: N/A;   DILATION AND  CURETTAGE OF UTERUS     LAPAROSCOPIC ENDOMETRIOSIS FULGURATION  10-1999   endometriosis   NECK SURGERY N/A 06/14/15   due to C5/6 C6-7 disc herniation    open heart surgery  12-1994   asd repair   OB History     Gravida  2   Para      Term      Preterm      AB  1   Living  1      SAB  1   IAB      Ectopic      Multiple      Live Births  1        Obstetric Comments  One child born 2002 had a miscarriage in 2009        Social History   Tobacco Use   Smoking status: Never   Smokeless tobacco: Never  Substance Use Topics   Alcohol use: Not Currently   Social History   Substance and Sexual Activity  Sexual Activity Yes   Partners: Male   Birth control/protection: None    Immunization History  Administered Date(s) Administered   Influenza Split 02/26/2011   Influenza, Seasonal, Injecte, Preservative Fre 03/24/2023   Influenza,inj,Quad PF,6+ Mos 03/15/2015, 03/23/2018, 03/25/2021, 05/25/2022   Influenza-Unspecified 03/23/2019, 05/07/2020   Tdap 03/15/2015     Review Of Systems  Constitutional: Denied constitutional symptoms, night sweats, recent illness, fatigue, fever, insomnia and weight loss.  Eyes: Denied eye symptoms, eye pain, photophobia, vision change and visual disturbance.  Ears/Nose/Throat/Neck: Denied ear, nose, throat or neck symptoms, hearing loss, nasal discharge, sinus congestion and sore throat.  Cardiovascular: Denied cardiovascular symptoms, arrhythmia, chest pain/pressure, edema, exercise intolerance, orthopnea and palpitations.  Respiratory: Denied pulmonary symptoms, asthma, pleuritic pain, productive sputum, cough, dyspnea and wheezing.  Gastrointestinal: Denied, gastro-esophageal reflux, melena, nausea and vomiting.  Genitourinary: Denied genitourinary symptoms including symptomatic vaginal discharge, pelvic relaxation issues, and urinary complaints.  Musculoskeletal: Denied musculoskeletal symptoms, stiffness, swelling,  muscle weakness and myalgia.  Dermatologic: Denied dermatology symptoms, rash and scar.  Neurologic: Denied neurology symptoms, dizziness, headache, neck pain and syncope.  Psychiatric: Denied psychiatric symptoms, anxiety and depression.  Endocrine: Denied endocrine symptoms including hot flashes and night sweats.      Objective:    BP 126/80   Pulse 83   Wt 221 lb 9.6 oz (100.5 kg)   LMP 05/06/2023   BMI 38.64 kg/m   Constitutional: Well-developed, well-nourished female in no acute distress Neurological: Alert and oriented to person, place, and time Psychiatric: Mood and affect appropriate Skin: No rashes or lesions Respiratory: normal work of breathing Cardiovascular: normal heart rate Gastrointestinal: completed with PCP exam earlier today. Breast Exam: deferred Genitourinary:         External Genitalia: Normal female genitalia    Vagina: hypo-rugated, no lesions.    Cervix: No lesions, normal size and consistency; no cervical motion tenderness     Uterus: Normal size and contour; smooth, mobile, NT, anteverted. Adnexae: Non-palpable and non-tender Perineum/Anus: No lesions Rectal: deferred    Autumn Messing, CNM  05/31/23 1:59 PM

## 2023-05-31 NOTE — Assessment & Plan Note (Signed)
-   Reviewed health maintenance topics as documented below. - Most recent pap smear in 2023, NILM, reviewed ASCCP guidelines and rational. Desires repeat pap smear today given history of abnormal pap smears. - Recommended calcium supplement for bone health.  - Mammogram UTD.  - Satisfied with current contraception. Rx renewed. - STI screening declined.  - Follows with PCP for other health maintenance screenings and labs.

## 2023-06-04 LAB — CYTOLOGY - PAP
Adequacy: ABSENT
Comment: NEGATIVE
Diagnosis: NEGATIVE
High risk HPV: NEGATIVE

## 2023-06-07 DIAGNOSIS — G4733 Obstructive sleep apnea (adult) (pediatric): Secondary | ICD-10-CM | POA: Diagnosis not present

## 2023-07-26 ENCOUNTER — Encounter: Payer: Self-pay | Admitting: Nurse Practitioner

## 2023-07-26 ENCOUNTER — Ambulatory Visit: Payer: Self-pay | Admitting: *Deleted

## 2023-07-26 ENCOUNTER — Ambulatory Visit: Payer: BC Managed Care – PPO | Admitting: Nurse Practitioner

## 2023-07-26 ENCOUNTER — Other Ambulatory Visit: Payer: Self-pay | Admitting: Nurse Practitioner

## 2023-07-26 VITALS — BP 144/92 | HR 91 | Temp 98.3°F | Resp 18 | Ht 63.5 in | Wt 226.5 lb

## 2023-07-26 DIAGNOSIS — I10 Essential (primary) hypertension: Secondary | ICD-10-CM | POA: Diagnosis not present

## 2023-07-26 MED ORDER — AMLODIPINE BESYLATE 5 MG PO TABS: 5.0000 mg | ORAL_TABLET | Freq: Every day | ORAL | 0 refills | Status: DC

## 2023-07-26 NOTE — Telephone Encounter (Signed)
 Reason for Disposition  Systolic BP  >= 180 OR Diastolic >= 110  Answer Assessment - Initial Assessment Questions 1. BLOOD PRESSURE: "What is the blood pressure?" "Did you take at least two measurements 5 minutes apart?"     180/110 Sat. At work.    184/110   171/101 on Sat.    It spikes and then come down in the evenings.    I get hot and sweaty.   Get a headache. I've not been feeling well.   Thur. Night at work I got lightheaded and felt like I was going to pass out.    I'm prediabetic  too.  My glucose was fine. This morning 146/71 right arm.    My right arm shows 10-20 points higher than my left. 2. ONSET: "When did you take your blood pressure?"     146/71 this morning.     3. HOW: "How did you take your blood pressure?" (e.g., automatic home BP monitor, visiting nurse)     I have a BP monitor at home 4. HISTORY: "Do you have a history of high blood pressure?"     No 5. MEDICINES: "Are you taking any medicines for blood pressure?" "Have you missed any doses recently?"     No 6. OTHER SYMPTOMS: "Do you have any symptoms?" (e.g., blurred vision, chest pain, difficulty breathing, headache, weakness)     Headache, sweaty feeling sweaty, getting lightheaded, feeling like I could pass out. 7. PREGNANCY: "Is there any chance you are pregnant?" "When was your last menstrual period?"     Not asked  Protocols used: Blood Pressure - High-A-AH

## 2023-07-26 NOTE — Progress Notes (Signed)
 BP (!) 144/92   Pulse 91   Temp 98.3 F (36.8 C)   Resp 18   Ht 5' 3.5" (1.613 m)   Wt 226 lb 8 oz (102.7 kg)   LMP 07/12/2023   SpO2 100%   BMI 39.49 kg/m    Subjective:    Patient ID: Deborah Navarro, female    DOB: [birth year], 43 y.o.   MRN: [id number]  HPI: Deborah Navarro is a 43 y.o. female  Chief Complaint  Patient presents with   Hypertension    Lightheaded, and headaches    Discussed the use of AI scribe software for clinical note transcription with the patient, who gave verbal consent to proceed.  History of Present Illness   The patient, with a history of prediabetes, presents with a persistent headache for the past couple of weeks. The headache is described as constant, non-throbbing, primarily across the forehead and temples. She also reports an overall feeling of unwellness. The patient experienced an episode of lightheadedness and near syncope at work, which she initially attributed to her prediabetic status or blood pressure. She purchased monitors to track her blood sugar and blood pressure levels. Over the weekend, she noticed spikes in her blood pressure, with the highest reading being 184/110. She denies any previous history of hypertension, except during pregnancy. The patient also reports no changes in vision or neurological symptoms.  The patient's headache has not responded to over-the-counter sinus medication or intermittent use of meloxicam . She also reports a slight drainage down her throat. The patient works in Engineering geologist and has been monitoring her blood pressure and blood sugar levels at different times of the day, noting fluctuations in both. She has a family history of diabetes.       07/26/2023   10:55 AM 05/31/2023    8:14 AM 03/29/2023    8:16 AM  Depression screen PHQ 2/9  Decreased Interest 0 0 0  Down, Depressed, Hopeless 0 0 0  PHQ - 2 Score 0 0 0  Altered sleeping   0  Tired, decreased energy   0  Change in appetite   0   Feeling bad or failure about yourself    0  Trouble concentrating   0  Moving slowly or fidgety/restless   0  Suicidal thoughts   0  PHQ-9 Score   0  Difficult doing work/chores   Not difficult at all    Relevant past medical, surgical, family and social history reviewed and updated as indicated. Interim medical history since our last visit reviewed. Allergies and medications reviewed and updated.  Review of Systems  Ten systems reviewed and is negative except as mentioned in HPI      Objective:    BP (!) 144/92   Pulse 91   Temp 98.3 F (36.8 C)   Resp 18   Ht 5' 3.5" (1.613 m)   Wt 226 lb 8 oz (102.7 kg)   LMP 07/12/2023   SpO2 100%   BMI 39.49 kg/m    Wt Readings from Last 3 Encounters:  07/26/23 226 lb 8 oz (102.7 kg)  05/31/23 221 lb 9.6 oz (100.5 kg)  05/31/23 221 lb 4.8 oz (100.4 kg)    Physical Exam Vitals reviewed.  Constitutional:      Appearance: Normal appearance.  HENT:     Head: Normocephalic.     Right Ear: Tympanic membrane normal.     Left Ear: Tympanic membrane normal.  Eyes:     Extraocular Movements:  Extraocular movements intact.     Pupils: Pupils are equal, round, and reactive to light.  Cardiovascular:     Rate and Rhythm: Normal rate.  Pulmonary:     Effort: Pulmonary effort is normal.  Neurological:     General: No focal deficit present.     Mental Status: She is alert and oriented to person, place, and time. Mental status is at baseline.  Psychiatric:        Mood and Affect: Mood normal.        Behavior: Behavior normal.        Thought Content: Thought content normal.        Judgment: Judgment normal.          Assessment & Plan:   Problem List Items Addressed This Visit       Cardiovascular and Mediastinum   Primary hypertension - Primary   Relevant Medications   amLODipine  (NORVASC ) 5 MG tablet     Assessment and Plan    Hypertension Newly diagnosed with home blood pressure readings as high as 184/110. No  history of hypertension except during pregnancy. No associated neurological symptoms or vision changes. -Start Amlodipine  5mg  daily. -Continue home blood pressure monitoring and report readings to the office. -Follow up in 2 weeks to assess response to medication.  Headache Persistent frontal headache for the past few weeks. No alleviation with Meloxicam . -Continue monitoring symptoms. -can take tylenol  as needed for headache -drink plenty of water  Prediabetes Borderline prediabetic with family history of diabetes. Last lab work 3 months ago. -Plan to check A1c at next visit in 2 weeks.  Medication cost Patient reported high deductible and difficulty affording medications. -Advised to use GoodRx for Amlodipine  prescription..  General Health Maintenance -Advised on dietary modifications to help manage hypertension and prediabetes, including reducing sodium and caffeine intake, and staying hydrated. -Plan to check labs at next visit in 2 weeks.        Follow up plan: Return in about 2 weeks (around 08/09/2023) for follow up with labs.

## 2023-07-26 NOTE — Telephone Encounter (Signed)
  Chief Complaint: elevated BP   spikes and then comes back down Symptoms: headaches, lightheadedness, sweaty, hot.   Last Thur. Thought she might pass out while at work Frequency: Since last week Pertinent Negatives: Patient denies pass out.  Not being treated for hypertension, not on meds for same.   Did have issues with elevated BP when pregnant over 20 yrs ago. Disposition: [] ED /[] Urgent Care (no appt availability in office) / [x] Appointment(In office/virtual)/ []  [facility]/ [] Home Care/ [] Refused Recommended Disposition /[] [facility]/ []  Follow-up with PCP Additional Notes: Appt made for today with Dr. Ava Lei at 10:40.

## 2023-07-27 NOTE — Telephone Encounter (Signed)
Reordered 07/26/23 #30  Requested Prescriptions  Refused Prescriptions Disp Refills   amLODipine (NORVASC) 5 MG tablet [Pharmacy Med Name: AMLODIPINE BESYLATE 5MG  TABLETS] 90 tablet     Sig: TAKE 1 TABLET(5 MG) BY MOUTH DAILY     There is no refill protocol information for this order

## 2023-08-02 ENCOUNTER — Encounter: Payer: Self-pay | Admitting: Family Medicine

## 2023-08-09 ENCOUNTER — Encounter: Payer: Self-pay | Admitting: Podiatry

## 2023-08-09 ENCOUNTER — Ambulatory Visit (INDEPENDENT_AMBULATORY_CARE_PROVIDER_SITE_OTHER): Payer: BC Managed Care – PPO | Admitting: Podiatry

## 2023-08-09 DIAGNOSIS — B351 Tinea unguium: Secondary | ICD-10-CM | POA: Diagnosis not present

## 2023-08-09 DIAGNOSIS — G5782 Other specified mononeuropathies of left lower limb: Secondary | ICD-10-CM | POA: Diagnosis not present

## 2023-08-09 MED ORDER — TERBINAFINE HCL 250 MG PO TABS: 250.0000 mg | ORAL_TABLET | Freq: Every day | ORAL | 0 refills | Status: DC

## 2023-08-09 NOTE — Progress Notes (Signed)
 She presents today states that her Lamisil is completed.  She denies fever chills nausea vomit muscle aches pains calf pain back pain chest pain shortness of breath with the medication.  She states that is looking much better she is happy with the outcome.  She is also been having some tenderness in her left foot particularly to her fourth toe of her left foot.  Objective: Vital signs stable alert oriented x 3 nail plates are healing very nicely appear to be 85% grown out clear.  She also has a palpable Mulder's click third interdigital space left foot.  Assessment: Neuroma third interspace left foot.  Resolving Lamisil therapy with onychomycosis.  Plan: Another 30 days of Lamisil 1 tablet every other day will be prescribed offered her an injection to the third interdigital space but she declined.

## 2023-08-10 ENCOUNTER — Ambulatory Visit (INDEPENDENT_AMBULATORY_CARE_PROVIDER_SITE_OTHER): Payer: BC Managed Care – PPO | Admitting: Family Medicine

## 2023-08-10 ENCOUNTER — Encounter: Payer: Self-pay | Admitting: Family Medicine

## 2023-08-10 VITALS — BP 132/78 | HR 91 | Temp 98.3°F | Resp 16 | Ht 63.5 in | Wt 226.6 lb

## 2023-08-10 DIAGNOSIS — F39 Unspecified mood [affective] disorder: Secondary | ICD-10-CM

## 2023-08-10 DIAGNOSIS — R202 Paresthesia of skin: Secondary | ICD-10-CM

## 2023-08-10 DIAGNOSIS — R519 Headache, unspecified: Secondary | ICD-10-CM

## 2023-08-10 DIAGNOSIS — I10 Essential (primary) hypertension: Secondary | ICD-10-CM

## 2023-08-10 DIAGNOSIS — E1169 Type 2 diabetes mellitus with other specified complication: Secondary | ICD-10-CM | POA: Diagnosis not present

## 2023-08-10 DIAGNOSIS — G4733 Obstructive sleep apnea (adult) (pediatric): Secondary | ICD-10-CM | POA: Diagnosis not present

## 2023-08-10 DIAGNOSIS — Z8489 Family history of other specified conditions: Secondary | ICD-10-CM

## 2023-08-10 DIAGNOSIS — Z7985 Long-term (current) use of injectable non-insulin antidiabetic drugs: Secondary | ICD-10-CM

## 2023-08-10 DIAGNOSIS — E785 Hyperlipidemia, unspecified: Secondary | ICD-10-CM | POA: Diagnosis not present

## 2023-08-10 DIAGNOSIS — M797 Fibromyalgia: Secondary | ICD-10-CM

## 2023-08-10 MED ORDER — TIRZEPATIDE 2.5 MG/0.5ML ~~LOC~~ SOAJ: 2.5000 mg | SUBCUTANEOUS | 0 refills | Status: DC

## 2023-08-10 MED ORDER — ATENOLOL 25 MG PO TABS: 25.0000 mg | ORAL_TABLET | Freq: Every day | ORAL | 0 refills | Status: DC

## 2023-08-10 NOTE — Progress Notes (Signed)
 Name: Deborah Navarro   MRN: [id number]    DOB: [birth year]   Date:08/10/2023       Progress Note  Subjective  Chief Complaint  Chief Complaint  Patient presents with   Medical Management of Chronic Issues   HPI   OSA: she started on CPAP about 4 weeks ago auto pap 4-20 cmH2O, she wears  CPAP every night. She states she still needs to take Modafinil to stay awake but in general feeling better when she first gets up in the morning,    FMS: she is down to 100 mg at night due to weight gain, and also taking Duloxetine 60 mg daily, she has a different position at work and pain average 1-2/10 - change in work description has helped.  She has history of elevated ANA , sed rate and CRP and sed rate. She was seen by Dr. Al Pimple and had more labs done, she has OA hands and positive ANA and at this time to try tumeric, ginger root daily. She continues to have discomfort under her breast but now also on inner thighs , periumbilical and also neck area - she has been seen by neurologist in 2017 and had MRI c-spine and brain that did not show MS Those episodes are seldom now. She has noticed some tingling on tip of her nose frequent and recurrent    Mood Disorder: she is doing well on Vraylar and Duloxetine plus lyrica at night for FMS. Mood has been stable, phq 9 is zero   History of c-spine surgery: Dec 2016, she has dull aches on neck and very seldom has radiculitis on right side    IBS:she tried trulance but caused diarrhea and stopped medication, bowel movements are almost daily now between bristol of 3 and 4    Urge incontinence: under the care of Dr. Sherron Monday and had a uro-dynamic study this morning. She tried multiple medications but not taking any medications at this time and symptoms are stable. She still has urgency but very seldom has accidents   Morbid obesity:  BMI above 35 with co-morbidities, she has OA and pain on feet, neuropathy, dyslipidemia, we started her on Rybelsus Fall 2022  and she had lost 31 lbs since started medication October 2022 , only able to tolerate 7 mg dose, however she gained 19 lbs back in the past 6 months. Today's weight is 226.6 lbs. She is taking Metformin but still gaining weight. She has been doing some weight training at home  New Onset DM: A1C was 6.5 % and on Metformin for pre diabetes, denies polyphagia or polyuria but has polydipsia. We will start GLP-1 agonist, discussed yearly eye exam, foot exam.   Headaches: mother had brain tumor, patient has noticed increase in headaches, now daily, waking up with a headache, she is very worried about brain tumor also has tingling on tip of left nose intermittently for months but getting more often, no other neuro deficits  HTN: noticed increase in bp over the past month, seen by Della Goo a few weeks ago, taking norvasc 5 mg and bp has improved , however still has some tachycardia that improves with rest. No palpitation or chest pain.     Patient Active Problem List   Diagnosis Date Noted   Primary hypertension 07/26/2023   Well woman exam 05/31/2023   OSA (obstructive sleep apnea) 03/25/2023   Vitamin D deficiency 07/01/2022   Neuropathic pain 07/01/2022   Fibromyalgia syndrome 07/01/2022   Pre-diabetes 07/01/2022  Dyslipidemia 07/01/2022   Snoring 04/21/2022   Morbid obesity (HCC) 10/01/2021   Cervical disc disease 03/07/2020   Irritable bowel syndrome (IBS) 09/26/2019   Mood disorder (HCC) 08/05/2018   Papanicolaou smear of cervix with positive high risk human papilloma virus (HPV) test 05/04/2018   False positive ana 04/28/2018   Pain in both feet 04/28/2018   Facial rash 04/07/2018   Chronic fatigue 04/07/2018   Chronic neck pain 03/27/2016   Myalgia 03/27/2016   Left-sided headache 03/27/2016   Family history of early CAD 03/15/2015   History of colon polyps 06/30/2010   B12 deficiency 06/30/2010   History of anemia 06/30/2010   Depression, major, in remission (HCC) 06/30/2010    Endometriosis 06/30/2010   CARDIAC MURMUR 06/30/2010   History of chicken pox 06/30/2010    Past Surgical History:  Procedure Laterality Date   COLONOSCOPY WITH PROPOFOL N/A 05/16/2018   Procedure: COLONOSCOPY WITH PROPOFOL;  Surgeon: Midge Minium, MD;  [facility];  Service: Endoscopy;  Laterality: N/A;   COLONOSCOPY WITH PROPOFOL N/A 05/20/2023   Procedure: COLONOSCOPY WITH PROPOFOL;  Surgeon: Midge Minium, MD;  [facility];  Service: Endoscopy;  Laterality: N/A;   DILATION AND CURETTAGE OF UTERUS     LAPAROSCOPIC ENDOMETRIOSIS FULGURATION  10-1999   endometriosis   NECK SURGERY N/A 06/14/15   due to C5/6 C6-7 disc herniation    open heart surgery  12-1994   asd repair    Family History  Problem Relation Age of Onset   Cancer Mother 67       non hodgkins lymphoma(mets to brain)   Coronary artery disease Father    Hypertension Father    Hyperlipidemia Father    Diabetes Father    Skin cancer Father    Diabetes Maternal Grandmother    Heart attack Maternal Grandfather    Other Maternal Grandfather        Deterioration of Jaw   Stroke Paternal Grandmother    Stroke Paternal Grandfather    Healthy Son     Social History   Tobacco Use   Smoking status: Never   Smokeless tobacco: Never  Substance Use Topics   Alcohol use: Not Currently     Current Outpatient Medications:    amLODipine (NORVASC) 5 MG tablet, Take 1 tablet (5 mg total) by mouth daily., Disp: 30 tablet, Rfl: 0   cariprazine (VRAYLAR) 1.5 MG capsule, Take 1 capsule (1.5 mg total) by mouth daily., Disp: 90 capsule, Rfl: 1   Cyanocobalamin (B-12) 1000 MCG SUBL, Place 1 tablet under the tongue daily., Disp: 30 tablet, Rfl: 0   DULoxetine (CYMBALTA) 60 MG capsule, Take 1 capsule (60 mg total) by mouth daily. After the 30 mg dose, Disp: 90 capsule, Rfl: 1   meloxicam (MOBIC) 15 MG tablet, TAKE 1 TABLET(15 MG) BY MOUTH DAILY, Disp: 90 tablet, Rfl: 0   metFORMIN (GLUCOPHAGE-XR) 500  MG 24 hr tablet, Take 1 tablet (500 mg total) by mouth daily with breakfast., Disp: 90 tablet, Rfl: 0   modafinil (PROVIGIL) 100 MG tablet, Take 1 tablet (100 mg total) by mouth daily., Disp: 90 tablet, Rfl: 1   pregabalin (LYRICA) 100 MG capsule, Take 1 capsule (100 mg total) by mouth at bedtime as needed., Disp: 90 capsule, Rfl: 0   terbinafine (LAMISIL) 250 MG tablet, Take 1 tablet (250 mg total) by mouth daily., Disp: 30 tablet, Rfl: 0   Vitamin D, Ergocalciferol, (DRISDOL) 1.25 MG (50000 UNIT) CAPS capsule, TAKE 1 CAPSULE BY MOUTH  EVERY 7 DAYS, Disp: 12 capsule, Rfl: 1   Norgestim-Eth Estrad Triphasic (NORGESTIMATE-ETHINYL ESTRADIOL TRIPHASIC) 0.18/0.215/0.25 MG-25 MCG tab, Take 1 tablet by mouth at bedtime for 28 days., Disp: 1 tablet, Rfl: 11  No Known Allergies  I personally reviewed active problem list, medication list, allergies, family history with the patient/caregiver today.   ROS  Ten systems reviewed and is negative except as mentioned in HPI    Objective  Vitals:   08/10/23 0917 08/10/23 0920  BP: 132/78   Pulse: (!) 103 91  Resp: 16   Temp: 98.3 F (36.8 C)   TempSrc: Oral   SpO2: 97%   Weight: 226 lb 9.6 oz (102.8 kg)   Height: 5' 3.5" (1.613 m)     Body mass index is 39.51 kg/m.  Physical Exam  Constitutional: Patient appears well-developed and well-nourished. Obese  No distress.  HEENT: head atraumatic, normocephalic, pupils equal and reactive to light, neck supple Cardiovascular: Normal rate, regular rhythm and normal heart sounds.  No murmur heard. No BLE edema. Pulmonary/Chest: Effort normal and breath sounds normal. No respiratory distress. Abdominal: Soft.  There is no tenderness. Neuro: no focal deficit Psychiatric: Patient has a normal mood and affect. behavior is normal. Judgment and thought content normal.   Recent Results (from the past 2160 hours)  Pregnancy, urine POC     Status: None   Collection Time: 05/20/23  7:32 AM  Result Value Ref  Range   Preg Test, Ur NEGATIVE NEGATIVE    Comment:        THE SENSITIVITY OF THIS METHODOLOGY IS >24 mIU/mL   POCT HgB A1C     Status: Abnormal   Collection Time: 05/31/23  9:17 AM  Result Value Ref Range   Hemoglobin A1C 6.5 (A) 4.0 - 5.6 %   HbA1c POC (<> result, manual entry)     HbA1c, POC (prediabetic range)     HbA1c, POC (controlled diabetic range)    Cytology - PAP     Status: None   Collection Time: 05/31/23  2:07 PM  Result Value Ref Range   High risk HPV Negative    Adequacy      Satisfactory for evaluation; transformation zone component ABSENT.   Diagnosis      - Negative for intraepithelial lesion or malignancy (NILM)   Microorganisms Shift in flora suggestive of bacterial vaginosis    Comment Normal Reference Range HPV - Negative     Diabetic Foot Exam:  Diabetic foot exam was performed with the following findings:   No deformities, ulcerations, or other skin breakdown Normal sensation of 10g monofilament Intact posterior tibialis and dorsalis pedis pulses      PHQ2/9:    08/10/2023    9:16 AM 07/26/2023   10:55 AM 05/31/2023    8:14 AM 03/29/2023    8:16 AM 12/21/2022    8:17 AM  Depression screen PHQ 2/9  Decreased Interest 0 0 0 0 0  Down, Depressed, Hopeless 0 0 0 0 0  PHQ - 2 Score 0 0 0 0 0  Altered sleeping 0   0 1  Tired, decreased energy 0   0 3  Change in appetite 0   0 3  Feeling bad or failure about yourself  0   0 0  Trouble concentrating 0   0 0  Moving slowly or fidgety/restless 0   0 0  Suicidal thoughts 0   0 0  PHQ-9 Score 0   0 7  Difficult doing  work/chores Not difficult at all   Not difficult at all Very difficult    phq 9 is negative  Fall Risk:    07/26/2023   10:47 AM 03/29/2023    8:16 AM 12/21/2022    8:17 AM 11/11/2022    2:16 PM 09/02/2022    8:17 AM  Fall Risk   Falls in the past year? 0 0 0 0 0  Number falls in past yr: 0 0  0 0  Injury with Fall? 0 0  0 0  Risk for fall due to :  No Fall Risks No Fall Risks No  Fall Risks No Fall Risks  Follow up  Falls prevention discussed;Education provided;Falls evaluation completed Falls prevention discussed Falls prevention discussed;Education provided;Falls evaluation completed Falls prevention discussed;Education provided;Falls evaluation completed     Assessment & Plan  1. Primary hypertension  - atenolol (TENORMIN) 25 MG tablet; Take 1 tablet (25 mg total) by mouth daily.  Dispense: 90 tablet; Refill: 0  2. Morbid obesity (HCC)  Discussed with the patient the risk posed by an increased BMI. Discussed importance of portion control, calorie counting and at least 150 minutes of physical activity weekly. Avoid sweet beverages and drink more water. Eat at least 6 servings of fruit and vegetables daily    3. Dyslipidemia due to type 2 diabetes mellitus (HCC) (Primary)  - tirzepatide (MOUNJARO) 2.5 MG/0.5ML Pen; Inject 2.5 mg into the skin once a week.  Dispense: 2 mL; Refill: 0 - Urine Microalbumin w/creat. ratio - Lipid Profile - COMPLETE METABOLIC PANEL WITH GFR  4. Mood disorder (HCC)  Continue medication  5. OSA on CPAP  - CBC with Differential/Platelet  6. Fibromyalgia syndrome  stable  7. New onset headache  - CT HEAD WO CONTRAST ( ); Future  8. Paresthesia  - CT HEAD WO CONTRAST ( ); Future  9. Family history of brain tumor  - CT HEAD WO CONTRAST ( ); Future

## 2023-08-11 ENCOUNTER — Encounter: Payer: Self-pay | Admitting: Family Medicine

## 2023-08-11 LAB — LIPID PANEL
Cholesterol: 269 mg/dL — ABNORMAL HIGH (ref <200)
HDL: 54 mg/dL (ref >=50)
LDL Cholesterol (Calc): 173 mg/dL — ABNORMAL HIGH
Non-HDL Cholesterol (Calc): 215 mg/dL — ABNORMAL HIGH (ref <130)
Total CHOL/HDL Ratio: 5 (calc) — ABNORMAL HIGH (ref <5.0)
Triglycerides: 245 mg/dL — ABNORMAL HIGH (ref <150)

## 2023-08-11 LAB — CBC WITH DIFFERENTIAL/PLATELET
Absolute Lymphocytes: 2573 {cells}/uL (ref 850–3900)
Absolute Monocytes: 527 {cells}/uL (ref 200–950)
Basophils Absolute: 50 {cells}/uL (ref 0–200)
Basophils Relative: 0.8 %
Eosinophils Absolute: 81 {cells}/uL (ref 15–500)
Eosinophils Relative: 1.3 %
HCT: 37.7 % (ref 35.0–45.0)
Hemoglobin: 12.4 g/dL (ref 11.7–15.5)
MCH: 29.9 pg (ref 27.0–33.0)
MCHC: 32.9 g/dL (ref 32.0–36.0)
MCV: 90.8 fL (ref 80.0–100.0)
MPV: 10 fL (ref 7.5–12.5)
Monocytes Relative: 8.5 %
Neutro Abs: 2970 {cells}/uL (ref 1500–7800)
Neutrophils Relative %: 47.9 %
Platelets: 350 10*3/uL (ref 140–400)
RBC: 4.15 10*6/uL (ref 3.80–5.10)
RDW: 12.5 % (ref 11.0–15.0)
Total Lymphocyte: 41.5 %
WBC: 6.2 10*3/uL (ref 3.8–10.8)

## 2023-08-11 LAB — COMPLETE METABOLIC PANEL WITH GFR
AG Ratio: 1.3 (calc) (ref 1.0–2.5)
ALT: 31 U/L — ABNORMAL HIGH (ref 6–29)
AST: 21 U/L (ref 10–30)
Albumin: 4.1 g/dL (ref 3.6–5.1)
Alkaline phosphatase (APISO): 76 U/L (ref 31–125)
BUN: 15 mg/dL (ref 7–25)
CO2: 29 mmol/L (ref 20–32)
Calcium: 9.5 mg/dL (ref 8.6–10.2)
Chloride: 101 mmol/L (ref 98–110)
Creat: 0.84 mg/dL (ref 0.50–0.99)
Globulin: 3.1 g/dL (ref 1.9–3.7)
Glucose, Bld: 118 mg/dL — ABNORMAL HIGH (ref 65–99)
Potassium: 4.4 mmol/L (ref 3.5–5.3)
Sodium: 136 mmol/L (ref 135–146)
Total Bilirubin: 0.2 mg/dL (ref 0.2–1.2)
Total Protein: 7.2 g/dL (ref 6.1–8.1)
eGFR: 88 mL/min/{1.73_m2} (ref >=60)

## 2023-08-11 LAB — MICROALBUMIN / CREATININE URINE RATIO
Creatinine, Urine: 92 mg/dL (ref 20–275)
Microalb Creat Ratio: 2 mg/g{creat} (ref <30)
Microalb, Ur: 0.2 mg/dL

## 2023-08-13 ENCOUNTER — Other Ambulatory Visit: Payer: Self-pay | Admitting: Family Medicine

## 2023-08-13 DIAGNOSIS — R202 Paresthesia of skin: Secondary | ICD-10-CM

## 2023-08-13 DIAGNOSIS — Z8489 Family history of other specified conditions: Secondary | ICD-10-CM

## 2023-08-13 DIAGNOSIS — R519 Headache, unspecified: Secondary | ICD-10-CM

## 2023-08-15 ENCOUNTER — Other Ambulatory Visit: Payer: Self-pay | Admitting: Nurse Practitioner

## 2023-08-15 DIAGNOSIS — I10 Essential (primary) hypertension: Secondary | ICD-10-CM

## 2023-08-16 ENCOUNTER — Encounter: Payer: Self-pay | Admitting: Gastroenterology

## 2023-08-17 NOTE — Telephone Encounter (Signed)
 Requested Prescriptions  Pending Prescriptions Disp Refills   amLODipine (NORVASC) 5 MG tablet [Pharmacy Med Name: AMLODIPINE BESYLATE 5MG  TABLETS] 30 tablet 0    Sig: TAKE 1 TABLET(5 MG) BY MOUTH DAILY     Cardiovascular: Calcium Channel Blockers 2 Passed - 08/17/2023  9:22 AM      Passed - Last BP in normal range    BP Readings from Last 1 Encounters:  08/10/23 132/78         Passed - Last Heart Rate in normal range    Pulse Readings from Last 1 Encounters:  08/10/23 91         Passed - Valid encounter within last 6 months    Recent Outpatient Visits           2 months ago Morbid obesity [facility])   [place] [facility] Alba Cory, [place]   4 months ago Fatigue, unspecified type   [facility] Mecum, Oswaldo Conroy, PA-C   7 months ago Morbid obesity [facility])   [place] [facility] Alba Cory, MD   9 months ago Left foot pain   [place] [facility] Danelle Berry, [place]   11 months ago Sore throat   [facility] Mecum, Oswaldo Conroy, [place]       Future Appointments             In 2 months Alba Cory, MD [facility], [facility]

## 2023-08-20 ENCOUNTER — Ambulatory Visit
Admission: RE | Admit: 2023-08-20 | Discharge: 2023-08-20 | Disposition: A | Discharge status: Home / Self Care | Source: Ambulatory Visit | Attending: Family Medicine | Admitting: Family Medicine

## 2023-08-20 DIAGNOSIS — R519 Headache, unspecified: Secondary | ICD-10-CM | POA: Diagnosis not present

## 2023-08-20 DIAGNOSIS — R202 Paresthesia of skin: Secondary | ICD-10-CM | POA: Insufficient documentation

## 2023-08-20 DIAGNOSIS — Z8489 Family history of other specified conditions: Secondary | ICD-10-CM | POA: Insufficient documentation

## 2023-08-20 DIAGNOSIS — D496 Neoplasm of unspecified behavior of brain: Secondary | ICD-10-CM | POA: Diagnosis not present

## 2023-08-23 ENCOUNTER — Encounter: Payer: Self-pay | Admitting: Family Medicine

## 2023-08-26 ENCOUNTER — Other Ambulatory Visit: Payer: Self-pay | Admitting: Family Medicine

## 2023-08-26 DIAGNOSIS — R7303 Prediabetes: Secondary | ICD-10-CM

## 2023-08-27 ENCOUNTER — Other Ambulatory Visit: Payer: Self-pay | Admitting: Family Medicine

## 2023-08-27 ENCOUNTER — Other Ambulatory Visit: Payer: Self-pay

## 2023-08-27 DIAGNOSIS — G629 Polyneuropathy, unspecified: Secondary | ICD-10-CM

## 2023-08-27 DIAGNOSIS — M797 Fibromyalgia: Secondary | ICD-10-CM

## 2023-08-27 MED ORDER — PREGABALIN 100 MG PO CAPS: 100.0000 mg | ORAL_CAPSULE | Freq: Every evening | ORAL | 0 refills | Status: DC

## 2023-08-27 MED ORDER — PREGABALIN 100 MG PO CAPS: ORAL_CAPSULE | ORAL | 0 refills | Status: DC

## 2023-08-27 NOTE — Addendum Note (Signed)
 Addended by: Forde Radon on: 08/27/2023 01:27 PM   Modules accepted: Orders

## 2023-08-28 ENCOUNTER — Other Ambulatory Visit: Payer: Self-pay | Admitting: Family Medicine

## 2023-08-28 DIAGNOSIS — F39 Unspecified mood [affective] disorder: Secondary | ICD-10-CM

## 2023-08-30 ENCOUNTER — Ambulatory Visit: Payer: Self-pay | Admitting: Family Medicine

## 2023-09-05 DIAGNOSIS — G4733 Obstructive sleep apnea (adult) (pediatric): Secondary | ICD-10-CM | POA: Diagnosis not present

## 2023-09-19 ENCOUNTER — Other Ambulatory Visit: Payer: Self-pay | Admitting: Nurse Practitioner

## 2023-09-19 ENCOUNTER — Other Ambulatory Visit: Payer: Self-pay | Admitting: Family Medicine

## 2023-09-19 DIAGNOSIS — I10 Essential (primary) hypertension: Secondary | ICD-10-CM

## 2023-09-19 DIAGNOSIS — E785 Hyperlipidemia, unspecified: Secondary | ICD-10-CM

## 2023-09-20 NOTE — Telephone Encounter (Signed)
 Requested Prescriptions  Pending Prescriptions Disp Refills   amLODipine (NORVASC) 5 MG tablet [Pharmacy Med Name: AMLODIPINE BESYLATE 5MG  TABLETS] 90 tablet 0    Sig: TAKE 1 TABLET(5 MG) BY MOUTH DAILY     Cardiovascular: Calcium Channel Blockers 2 Passed - 09/20/2023  5:46 PM      Passed - Last BP in normal range    BP Readings from Last 1 Encounters:  08/10/23 132/78         Passed - Last Heart Rate in normal range    Pulse Readings from Last 1 Encounters:  08/10/23 91         Passed - Valid encounter within last 6 months    Recent Outpatient Visits           1 month ago Dyslipidemia due to type 2 diabetes mellitus [facility])   [facility] Alba Cory, MD   1 month ago Primary hypertension   [facility] Berniece Salines, FNP       Future Appointments             In 1 month Alba Cory, MD [facility], [facility]

## 2023-10-22 ENCOUNTER — Other Ambulatory Visit: Payer: Self-pay | Admitting: Family Medicine

## 2023-10-22 DIAGNOSIS — G629 Polyneuropathy, unspecified: Secondary | ICD-10-CM

## 2023-10-22 DIAGNOSIS — M797 Fibromyalgia: Secondary | ICD-10-CM

## 2023-10-22 DIAGNOSIS — E785 Hyperlipidemia, unspecified: Secondary | ICD-10-CM

## 2023-10-22 MED ORDER — TIRZEPATIDE 5 MG/0.5ML ~~LOC~~ SOAJ: 5.0000 mg | SUBCUTANEOUS | 0 refills | Status: DC

## 2023-10-25 DIAGNOSIS — E119 Type 2 diabetes mellitus without complications: Secondary | ICD-10-CM | POA: Diagnosis not present

## 2023-10-25 LAB — HM DIABETES EYE EXAM

## 2023-11-01 ENCOUNTER — Ambulatory Visit: Payer: BC Managed Care – PPO | Admitting: Podiatry

## 2023-11-07 ENCOUNTER — Other Ambulatory Visit: Payer: Self-pay | Admitting: Family Medicine

## 2023-11-07 DIAGNOSIS — I10 Essential (primary) hypertension: Secondary | ICD-10-CM

## 2023-11-09 ENCOUNTER — Other Ambulatory Visit (HOSPITAL_COMMUNITY): Payer: Self-pay

## 2023-11-09 ENCOUNTER — Telehealth: Payer: Self-pay | Admitting: Pharmacy Technician

## 2023-11-09 NOTE — Telephone Encounter (Signed)
 Pharmacy Patient Advocate Encounter  Received notification from [employer] that Prior Authorization for Modafinil  100MG  tablets has been APPROVED from 11/09/23 to 05/11/24. Ran test claim, Copay is $25.00. This test claim was processed through [facility]- copay amounts may vary at other pharmacies due to pharmacy/plan contracts, or as the patient moves through the different stages of their insurance plan.   PA #/Case ID/Reference #:  [id number]

## 2023-11-09 NOTE — Telephone Encounter (Signed)
 Pharmacy Patient Advocate Encounter   Received notification from CoverMyMeds that prior authorization for Modafinil  100MG  tablets is required/requested.   Insurance verification completed.   The patient is insured through [facility] .   Per test claim: PA required; PA submitted to above mentioned insurance via CoverMyMeds Key/confirmation #/EOC [id number] Status is pending

## 2023-11-10 ENCOUNTER — Encounter: Payer: Self-pay | Admitting: Family Medicine

## 2023-11-10 ENCOUNTER — Ambulatory Visit: Payer: BC Managed Care – PPO | Admitting: Family Medicine

## 2023-11-10 VITALS — BP 114/74 | HR 80 | Resp 16 | Ht 63.5 in | Wt 206.6 lb

## 2023-11-10 DIAGNOSIS — G629 Polyneuropathy, unspecified: Secondary | ICD-10-CM

## 2023-11-10 DIAGNOSIS — E1169 Type 2 diabetes mellitus with other specified complication: Secondary | ICD-10-CM | POA: Diagnosis not present

## 2023-11-10 DIAGNOSIS — E785 Hyperlipidemia, unspecified: Secondary | ICD-10-CM

## 2023-11-10 DIAGNOSIS — I10 Essential (primary) hypertension: Secondary | ICD-10-CM | POA: Diagnosis not present

## 2023-11-10 DIAGNOSIS — M797 Fibromyalgia: Secondary | ICD-10-CM

## 2023-11-10 DIAGNOSIS — G4733 Obstructive sleep apnea (adult) (pediatric): Secondary | ICD-10-CM

## 2023-11-10 DIAGNOSIS — F39 Unspecified mood [affective] disorder: Secondary | ICD-10-CM | POA: Diagnosis not present

## 2023-11-10 DIAGNOSIS — E538 Deficiency of other specified B group vitamins: Secondary | ICD-10-CM

## 2023-11-10 DIAGNOSIS — E559 Vitamin D deficiency, unspecified: Secondary | ICD-10-CM

## 2023-11-10 DIAGNOSIS — K582 Mixed irritable bowel syndrome: Secondary | ICD-10-CM

## 2023-11-10 LAB — POCT GLYCOSYLATED HEMOGLOBIN (HGB A1C): Hemoglobin A1C: 5.6 % (ref 4.0–5.6)

## 2023-11-10 LAB — B12 AND FOLATE PANEL
Folate: 13.1 ng/mL
Vitamin B-12: 1745 pg/mL — ABNORMAL HIGH (ref 200–1100)

## 2023-11-10 MED ORDER — ATENOLOL 25 MG PO TABS
25.0000 mg | ORAL_TABLET | Freq: Every day | ORAL | 0 refills | Status: DC
Start: 2023-11-10 — End: 2024-02-11

## 2023-11-10 MED ORDER — MODAFINIL 100 MG PO TABS: 100.0000 mg | ORAL_TABLET | Freq: Every day | ORAL | 0 refills | Status: DC

## 2023-11-10 MED ORDER — VITAMIN D (ERGOCALCIFEROL) 1.25 MG (50000 UNIT) PO CAPS: 50000.0000 [IU] | ORAL_CAPSULE | ORAL | 1 refills | Status: AC

## 2023-11-10 MED ORDER — DULOXETINE HCL 30 MG PO CPEP: 90.0000 mg | ORAL_CAPSULE | Freq: Every day | ORAL | 0 refills | Status: DC

## 2023-11-10 MED ORDER — TIRZEPATIDE 5 MG/0.5ML ~~LOC~~ SOAJ: 5.0000 mg | SUBCUTANEOUS | 0 refills | Status: DC

## 2023-11-10 MED ORDER — ROSUVASTATIN CALCIUM 5 MG PO TABS: 5.0000 mg | ORAL_TABLET | Freq: Every day | ORAL | 1 refills | Status: DC

## 2023-11-10 MED ORDER — CARIPRAZINE HCL 1.5 MG PO CAPS: 1.5000 mg | ORAL_CAPSULE | Freq: Every day | ORAL | 0 refills | Status: DC

## 2023-11-10 MED ORDER — POLYETHYLENE GLYCOL 3350 17 GM/SCOOP PO POWD: 17.0000 g | Freq: Every day | ORAL | 0 refills | Status: AC | PRN

## 2023-11-10 MED ORDER — PREGABALIN 75 MG PO CAPS: 75.0000 mg | ORAL_CAPSULE | Freq: Every day | ORAL | 0 refills | Status: DC

## 2023-11-10 MED ORDER — AMLODIPINE BESYLATE 5 MG PO TABS
5.0000 mg | ORAL_TABLET | Freq: Every day | ORAL | 0 refills | Status: DC
Start: 2023-11-10 — End: 2024-02-11

## 2023-11-10 NOTE — Progress Notes (Signed)
 Name: Deborah Navarro   MRN: [id number]    DOB: [birth year]   Date:11/10/2023       Progress Note  Subjective  Chief Complaint  Chief Complaint  Patient presents with   Medical Management of Chronic Issues   Discussed the use of AI scribe software for clinical note transcription with the patient, who gave verbal consent to proceed.  History of Present Illness Deborah Navarro is a 43 year old female with diabetes and fibromyalgia who presents with fatigue and medication management.  She has experienced significant weight loss, from 227 pounds in February to 206.6 pounds currently, attributed to the use of a GLP-1 agonist at a dose of 5 mg. This has led to a reduction in appetite and improved diabetes control, with her A1c decreasing from 6.5 in December to 5.6 currently. No increased thirst or urination, indicating stable diabetes symptoms.  She reports persistent fatigue, describing it as 'dragging myself up out of bed.' She takes modafinil  to help stay awake but continues to struggle with tiredness. She uses a CPAP machine daily for obstructive sleep apnea and is compliant with its use. Her mood is stable on Vraylar , and she denies mood-related causes for her fatigue. She is concerned about her B12 levels contributing to her fatigue, with past B12 levels showing a decrease from 1200 in October 2022 to 666 in January and 648 in October.  She has a history of fibromyalgia and experiences pain in her hip and other areas. She is currently on Cymbalta  and Lyrica  for pain management. She wants to reduce Lyrica  due to side effects like weight gain and tiredness.  She has a family history of heart disease, with her father having had multiple stents placed. Her lipid panel shows elevated LDL at 173 and triglycerides at 345. She is not currently on cholesterol medication.  She experiences constipation, which she attributes to her current medication regimen. She reports bowel movements  approximately once a week and has tried various treatments in the past, which resulted in diarrhea.  She continues to take amlodipine  5 mg for blood pressure management, which has improved, and atenolol  for anxiety and HTN.   She has stopped taking birth control as she is not sexually active.   She takes vitamin D  weekly and B12 daily.    Patient Active Problem List   Diagnosis Date Noted   Primary hypertension 07/26/2023   Well woman exam 05/31/2023   OSA on CPAP 03/25/2023   Vitamin D  deficiency 07/01/2022   Neuropathic pain 07/01/2022   Fibromyalgia syndrome 07/01/2022   Pre-diabetes 07/01/2022   Dyslipidemia due to type 2 diabetes mellitus (HCC) 07/01/2022   Snoring 04/21/2022   Morbid obesity (HCC) 10/01/2021   Cervical disc disease 03/07/2020   Irritable bowel syndrome (IBS) 09/26/2019   Mood disorder (HCC) 08/05/2018   Papanicolaou smear of cervix with positive high risk human papilloma virus (HPV) test 05/04/2018   False positive ana 04/28/2018   Pain in both feet 04/28/2018   Facial rash 04/07/2018   Chronic fatigue 04/07/2018   Chronic neck pain 03/27/2016   Myalgia 03/27/2016   Left-sided headache 03/27/2016   Family history of early CAD 03/15/2015   History of colon polyps 06/30/2010   B12 deficiency 06/30/2010   History of anemia 06/30/2010   Depression, major, in remission (HCC) 06/30/2010   Endometriosis 06/30/2010   CARDIAC MURMUR 06/30/2010   History of chicken pox 06/30/2010    Past Surgical History:  Procedure Laterality Date   COLONOSCOPY  WITH PROPOFOL  N/A 05/16/2018   Procedure: COLONOSCOPY WITH PROPOFOL ;  Surgeon: Marnee Sink, MD;  Location: [facility];  Service: Endoscopy;  Laterality: N/A;   COLONOSCOPY WITH PROPOFOL  N/A 05/20/2023   Procedure: COLONOSCOPY WITH PROPOFOL ;  Surgeon: Marnee Sink, MD;  Location: [facility];  Service: Endoscopy;  Laterality: N/A;   DILATION AND CURETTAGE OF UTERUS     LAPAROSCOPIC ENDOMETRIOSIS  FULGURATION  10-1999   endometriosis   NECK SURGERY N/A 06/14/15   due to C5/6 C6-7 disc herniation    open heart surgery  12-1994   asd repair    Family History  Problem Relation Age of Onset   Cancer Mother 28       non hodgkins lymphoma(mets to brain)   Coronary artery disease Father    Hypertension Father    Hyperlipidemia Father    Diabetes Father    Skin cancer Father    Diabetes Maternal Grandmother    Heart attack Maternal Grandfather    Other Maternal Grandfather        Deterioration of Jaw   Stroke Paternal Grandmother    Stroke Paternal Grandfather    Healthy Son     Social History   Tobacco Use   Smoking status: Never   Smokeless tobacco: Never  Substance Use Topics   Alcohol use: Not Currently     Current Outpatient Medications:    amLODipine  (NORVASC ) 5 MG tablet, TAKE 1 TABLET(5 MG) BY MOUTH DAILY, Disp: 90 tablet, Rfl: 0   atenolol  (TENORMIN ) 25 MG tablet, Take 1 tablet (25 mg total) by mouth daily., Disp: 90 tablet, Rfl: 0   cariprazine  (VRAYLAR ) 1.5 MG capsule, Take 1 capsule (1.5 mg total) by mouth daily., Disp: 90 capsule, Rfl: 1   Cyanocobalamin  (B-12) 1000 MCG SUBL, Place 1 tablet under the tongue daily., Disp: 30 tablet, Rfl: 0   DULoxetine  (CYMBALTA ) 60 MG capsule, Take 1 capsule (60 mg total) by mouth daily. After the 30 mg dose, Disp: 90 capsule, Rfl: 1   meloxicam  (MOBIC ) 15 MG tablet, TAKE 1 TABLET(15 MG) BY MOUTH DAILY, Disp: 90 tablet, Rfl: 0   metFORMIN  (GLUCOPHAGE -XR) 500 MG 24 hr tablet, TAKE 1 TABLET(500 MG) BY MOUTH DAILY WITH BREAKFAST, Disp: 90 tablet, Rfl: 0   modafinil  (PROVIGIL ) 100 MG tablet, Take 1 tablet (100 mg total) by mouth daily., Disp: 90 tablet, Rfl: 1   pregabalin  (LYRICA ) 100 MG capsule, TAKE 1 CAPSULE(100 MG) BY MOUTH AT BEDTIME AS NEEDED, Disp: 90 capsule, Rfl: 0   tirzepatide (MOUNJARO) 5 MG/0.5ML Pen, Inject 5 mg into the skin once a week., Disp: 2 mL, Rfl: 0   Vitamin D , Ergocalciferol , (DRISDOL ) 1.25 MG (50000  UNIT) CAPS capsule, TAKE 1 CAPSULE BY MOUTH EVERY 7 DAYS, Disp: 12 capsule, Rfl: 1   Norgestim-Eth Estrad Triphasic (NORGESTIMATE-ETHINYL ESTRADIOL TRIPHASIC) 0.18/0.215/0.25 MG-25 MCG tab, Take 1 tablet by mouth at bedtime for 28 days., Disp: 1 tablet, Rfl: 11   terbinafine  (LAMISIL ) 250 MG tablet, Take 1 tablet (250 mg total) by mouth daily., Disp: 30 tablet, Rfl: 0  No Known Allergies  I personally reviewed active problem list, medication list, allergies, family history with the patient/caregiver today.   ROS  Ten systems reviewed and is negative except as mentioned in HPI    Objective Physical Exam CONSTITUTIONAL: Patient appears well-developed and well-nourished.  No distress. HEENT: Head atraumatic, normocephalic, neck supple. CARDIOVASCULAR: Normal rate, regular rhythm and normal heart sounds.  No murmur heard. No BLE edema. PULMONARY: Effort normal and breath  sounds normal. No respiratory distress. ABDOMINAL: There is no tenderness or distention. MUSCULOSKELETAL: Normal gait. Without gross motor or sensory deficit. PSYCHIATRIC: Patient has a normal mood and affect. behavior is normal. Judgment and thought content normal.  Vitals:   11/10/23 1502  BP: 114/74  Pulse: 80  Resp: 16  SpO2: 98%  Weight: 206 lb 9.6 oz (93.7 kg)  Height: 5' 3.5" (1.613 m)    Body mass index is 36.02 kg/m.  Recent Results (from the past 2160 hours)  POCT glycosylated hemoglobin (Hb A1C)     Status: None   Collection Time: 11/10/23  3:07 PM  Result Value Ref Range   Hemoglobin A1C 5.6 4.0 - 5.6 %   HbA1c POC (<> result, manual entry)     HbA1c, POC (prediabetic range)     HbA1c, POC (controlled diabetic range)      Diabetic Foot Exam:     PHQ2/9:    11/10/2023    3:00 PM 08/10/2023    9:16 AM 07/26/2023   10:55 AM 05/31/2023    8:14 AM 03/29/2023    8:16 AM  Depression screen PHQ 2/9  Decreased Interest 0 0 0 0 0  Down, Depressed, Hopeless 0 0 0 0 0  PHQ - 2 Score 0 0 0 0 0   Altered sleeping 0 0   0  Tired, decreased energy 0 0   0  Change in appetite 0 0   0  Feeling bad or failure about yourself  0 0   0  Trouble concentrating 0 0   0  Moving slowly or fidgety/restless 0 0   0  Suicidal thoughts 0 0   0  PHQ-9 Score 0 0   0  Difficult doing work/chores Not difficult at all Not difficult at all   Not difficult at all    phq 9 is negative  Fall Risk:    11/10/2023    2:55 PM 07/26/2023   10:47 AM 03/29/2023    8:16 AM 12/21/2022    8:17 AM 11/11/2022    2:16 PM  Fall Risk   Falls in the past year? 0 0 0 0 0  Number falls in past yr: 0 0 0  0  Injury with Fall? 0 0 0  0  Risk for fall due to : No Fall Risks  No Fall Risks No Fall Risks No Fall Risks  Follow up Falls prevention discussed;Education provided;Falls evaluation completed  Falls prevention discussed;Education provided;Falls evaluation completed Falls prevention discussed Falls prevention discussed;Education provided;Falls evaluation completed     Assessment and Plan Assessment & Plan Type 2 diabetes mellitus Diabetes well-controlled with A1c reduced to 5.6%. Mounjaro effective in glycemic control and appetite suppression. - Continue Mounjaro (tirzepatide) 5 mg, provide a 65-month supply. - Discontinue metformin .  Hyperlipidemia LDL 173 mg/dL, triglycerides 161 mg/dL. Family history of early heart disease increases cardiovascular risk. Rosuvastatin initiation recommended. - Initiate rosuvastatin 5 mg daily.  Hypertension Hypertension well-controlled with amlodipine  and atenolol . Future reduction of amlodipine  considered if control persists. - Continue amlodipine  5 mg daily. - Continue atenolol  as prescribed. - Consider reducing amlodipine  to 2.5 mg if blood pressure remains well-controlled.  Fibromyalgia Fatigue and hip pain present. Medication adjustment needed due to fatigue and weight gain concerns with Lyrica . - Increase duloxetine  to 90 mg daily, using three 30 mg pills if  necessary. - Decrease pregabalin  to 75 mg at night.  Obstructive sleep apnea Managed with CPAP, compliance reported. Persistent fatigue noted. - Continue CPAP  therapy. - Continue modafinil  for daytime wakefulness.  Mood disorder Managed with Vraylar , no mood-related issues reported. Fatigue persists. - Continue Vraylar  (cariprazine ) as prescribed.  Irritable bowel syndrome with constipation Constipation exacerbated by medication. Previous treatments caused diarrhea. Metamucil recommended. - Recommend Metamucil for constipation management. - Consider Miralax  if Metamucil is not effective.  Vitamin D  deficiency Managed with prescription vitamin D . - Continue prescription vitamin D  weekly.

## 2023-11-11 ENCOUNTER — Ambulatory Visit: Payer: Self-pay | Admitting: Family Medicine

## 2023-12-02 ENCOUNTER — Other Ambulatory Visit: Payer: Self-pay | Admitting: Family Medicine

## 2023-12-02 DIAGNOSIS — F39 Unspecified mood [affective] disorder: Secondary | ICD-10-CM

## 2023-12-02 DIAGNOSIS — M797 Fibromyalgia: Secondary | ICD-10-CM

## 2023-12-02 DIAGNOSIS — R7303 Prediabetes: Secondary | ICD-10-CM

## 2023-12-06 DIAGNOSIS — G4733 Obstructive sleep apnea (adult) (pediatric): Secondary | ICD-10-CM | POA: Diagnosis not present

## 2023-12-20 ENCOUNTER — Ambulatory Visit (INDEPENDENT_AMBULATORY_CARE_PROVIDER_SITE_OTHER): Admitting: Family Medicine

## 2023-12-20 ENCOUNTER — Encounter: Payer: Self-pay | Admitting: Family Medicine

## 2023-12-20 ENCOUNTER — Ambulatory Visit: Payer: Self-pay

## 2023-12-20 VITALS — BP 112/74 | HR 87 | Resp 16 | Ht 63.5 in | Wt 198.7 lb

## 2023-12-20 DIAGNOSIS — M79671 Pain in right foot: Secondary | ICD-10-CM

## 2023-12-20 DIAGNOSIS — M79672 Pain in left foot: Secondary | ICD-10-CM

## 2023-12-20 DIAGNOSIS — R109 Unspecified abdominal pain: Secondary | ICD-10-CM

## 2023-12-20 DIAGNOSIS — M797 Fibromyalgia: Secondary | ICD-10-CM

## 2023-12-20 LAB — CBC WITH DIFFERENTIAL/PLATELET
Absolute Lymphocytes: 2684 {cells}/uL (ref 850–3900)
Absolute Monocytes: 464 {cells}/uL (ref 200–950)
Basophils Absolute: 43 {cells}/uL (ref 0–200)
Basophils Relative: 0.7 %
Eosinophils Absolute: 49 {cells}/uL (ref 15–500)
Eosinophils Relative: 0.8 %
HCT: 37.4 % (ref 35.0–45.0)
Hemoglobin: 12.5 g/dL (ref 11.7–15.5)
MCH: 30.1 pg (ref 27.0–33.0)
MCHC: 33.4 g/dL (ref 32.0–36.0)
MCV: 90.1 fL (ref 80.0–100.0)
MPV: 10.5 fL (ref 7.5–12.5)
Monocytes Relative: 7.6 %
Neutro Abs: 2861 {cells}/uL (ref 1500–7800)
Neutrophils Relative %: 46.9 %
Platelets: 297 Thousand/uL (ref 140–400)
RBC: 4.15 Million/uL (ref 3.80–5.10)
RDW: 12.3 % (ref 11.0–15.0)
Total Lymphocyte: 44 %
WBC: 6.1 Thousand/uL (ref 3.8–10.8)

## 2023-12-20 LAB — POCT URINALYSIS DIPSTICK
Bilirubin, UA: NEGATIVE
Glucose, UA: NEGATIVE
Ketones, UA: NEGATIVE
Nitrite, UA: NEGATIVE
Protein, UA: NEGATIVE
Spec Grav, UA: 1.015 (ref 1.010–1.025)
Urobilinogen, UA: 0.2 U/dL
pH, UA: 6 (ref 5.0–8.0)

## 2023-12-20 LAB — COMPREHENSIVE METABOLIC PANEL WITH GFR
AG Ratio: 1.7 (calc) (ref 1.0–2.5)
ALT: 20 U/L (ref 6–29)
AST: 15 U/L (ref 10–30)
Albumin: 4.4 g/dL (ref 3.6–5.1)
Alkaline phosphatase (APISO): 75 U/L (ref 31–125)
BUN: 12 mg/dL (ref 7–25)
CO2: 29 mmol/L (ref 20–32)
Calcium: 9.1 mg/dL (ref 8.6–10.2)
Chloride: 102 mmol/L (ref 98–110)
Creat: 0.96 mg/dL (ref 0.50–0.99)
Globulin: 2.6 g/dL (ref 1.9–3.7)
Glucose, Bld: 84 mg/dL (ref 65–99)
Potassium: 4.3 mmol/L (ref 3.5–5.3)
Sodium: 138 mmol/L (ref 135–146)
Total Bilirubin: 0.3 mg/dL (ref 0.2–1.2)
Total Protein: 7 g/dL (ref 6.1–8.1)
eGFR: 75 mL/min/1.73m2 (ref >=60)

## 2023-12-20 LAB — LIPASE: Lipase: 27 U/L (ref 7–60)

## 2023-12-20 MED ORDER — MELOXICAM 15 MG PO TABS: 15.0000 mg | ORAL_TABLET | Freq: Every day | ORAL | 0 refills | Status: DC | PRN

## 2023-12-20 NOTE — Progress Notes (Signed)
 Name: Deborah Navarro   MRN: [id number]    DOB: 1980-10-20   Date:12/20/2023       Progress Note  Subjective  Chief Complaint  Chief Complaint  Patient presents with   Abdominal Pain    LL quadrant  x2 weeks    Discussed the use of AI scribe software for clinical note transcription with the patient, who gave verbal consent to proceed.  History of Present Illness Deborah Navarro is a 43 year old female with irritable bowel syndrome who presents with left-sided abdominal pain.  She has been experiencing left-sided abdominal pain for approximately two weeks. The pain is both dull and sharp, intermittent, and exacerbated by movement such as bending, standing for long periods, and walking. Deep breathing can also trigger the pain. No fever, chills, back pain, or urinary symptoms.  She has a history of constipation and irritable bowel syndrome (IBS). Despite recent bowel movements, the pain has not been alleviated. There is no blood or mucus in her stools, and no changes in bowel movement frequency or form. She reports decreased appetite, which she attributes to Mounjaro .  She has not been taking her usual medication, meloxicam , due to issues with prescription renewal, and has been using Advil  occasionally for pain relief. Her menstrual cycle is currently ongoing. No family history of kidney stones, although her husband experiences them frequently. No recent changes in her medications aside from the lapse in meloxicam  use.    Patient Active Problem List   Diagnosis Date Noted   Primary hypertension 07/26/2023   Well woman exam 05/31/2023   OSA on CPAP 03/25/2023   Vitamin D  deficiency 07/01/2022   Neuropathic pain 07/01/2022   Fibromyalgia syndrome 07/01/2022   Pre-diabetes 07/01/2022   Dyslipidemia 07/01/2022   Snoring 04/21/2022   Morbid obesity (HCC) 10/01/2021   Cervical disc disease 03/07/2020   Irritable bowel syndrome (IBS) 09/26/2019   Mood disorder (HCC) 08/05/2018    Papanicolaou smear of cervix with positive high risk human papilloma virus (HPV) test 05/04/2018   False positive ana 04/28/2018   Pain in both feet 04/28/2018   Facial rash 04/07/2018   Chronic fatigue 04/07/2018   Chronic neck pain 03/27/2016   Myalgia 03/27/2016   Left-sided headache 03/27/2016   Family history of early CAD 03/15/2015   History of colon polyps 06/30/2010   B12 deficiency 06/30/2010   History of anemia 06/30/2010   Depression, major, in remission (HCC) 06/30/2010   Endometriosis 06/30/2010   CARDIAC MURMUR 06/30/2010   History of chicken pox 06/30/2010    Social History   Tobacco Use   Smoking status: Never   Smokeless tobacco: Never  Substance Use Topics   Alcohol use: Not Currently     Current Outpatient Medications:    amLODipine  (NORVASC ) 5 MG tablet, Take 1 tablet (5 mg total) by mouth daily., Disp: 90 tablet, Rfl: 0   atenolol  (TENORMIN ) 25 MG tablet, Take 1 tablet (25 mg total) by mouth daily., Disp: 90 tablet, Rfl: 0   cariprazine  (VRAYLAR ) 1.5 MG capsule, Take 1 capsule (1.5 mg total) by mouth daily., Disp: 90 capsule, Rfl: 0   Cyanocobalamin  (B-12) 1000 MCG SUBL, Place 1 tablet under the tongue daily., Disp: 30 tablet, Rfl: 0   DULoxetine  (CYMBALTA ) 30 MG capsule, Take 3 capsules (90 mg total) by mouth daily. After the 30 mg dose, Disp: 270 capsule, Rfl: 0   modafinil  (PROVIGIL ) 100 MG tablet, Take 1 tablet (100 mg total) by mouth daily., Disp: 90 tablet, Rfl:  0   polyethylene glycol powder (GLYCOLAX /MIRALAX ) 17 GM/SCOOP powder, Take 17 g by mouth daily as needed., Disp: 510 g, Rfl: 0   pregabalin  (LYRICA ) 75 MG capsule, Take 1 capsule (75 mg total) by mouth at bedtime., Disp: 90 capsule, Rfl: 0   rosuvastatin  (CRESTOR ) 5 MG tablet, Take 1 tablet (5 mg total) by mouth daily., Disp: 90 tablet, Rfl: 1   tirzepatide  (MOUNJARO ) 5 MG/0.5ML Pen, Inject 5 mg into the skin once a week., Disp: 6 mL, Rfl: 0   Vitamin D , Ergocalciferol , (DRISDOL ) 1.25 MG  (50000 UNIT) CAPS capsule, Take 1 capsule (50,000 Units total) by mouth every 7 (seven) days., Disp: 12 capsule, Rfl: 1   meloxicam  (MOBIC ) 15 MG tablet, Take 1 tablet (15 mg total) by mouth daily as needed for pain., Disp: 90 tablet, Rfl: 0  No Known Allergies  ROS  Ten systems reviewed and is negative except as mentioned in HPI    Objective  Vitals:   12/20/23 1521  BP: 112/74  Pulse: 87  Resp: 16  SpO2: 99%  Weight: 198 lb 11.2 oz (90.1 kg)  Height: 5' 3.5 (1.613 m)    Body mass index is 34.65 kg/m.  Physical Exam CONSTITUTIONAL: Patient appears well-developed and well-nourished. No distress. HEENT: Head atraumatic, normocephalic, neck supple. Oral cavity normal. CARDIOVASCULAR: Normal rate, regular rhythm and normal heart sounds. No murmur heard. No BLE edema. PULMONARY: Effort normal and breath sounds normal. No respiratory distress. ABDOMINAL: Abdomen slightly distended. Normal bowel sounds. Tenderness on palpation of Lflank and LLQ negative for rebound tenderness . PSYCHIATRIC: Patient has a normal mood and affect. Behavior is normal. Judgment and thought content normal.  Recent Results (from the past 2160 hours)  HM DIABETES EYE EXAM     Status: None   Collection Time: 10/25/23  3:34 PM  Result Value Ref Range   HM Diabetic Eye Exam No Retinopathy No Retinopathy    Comment: ABS BY HIM  POCT glycosylated hemoglobin (Hb A1C)     Status: None   Collection Time: 11/10/23  3:07 PM  Result Value Ref Range   Hemoglobin A1C 5.6 4.0 - 5.6 %   HbA1c POC (<> result, manual entry)     HbA1c, POC (prediabetic range)     HbA1c, POC (controlled diabetic range)    B12 and Folate Panel     Status: Abnormal   Collection Time: 11/10/23  3:52 PM  Result Value Ref Range   Vitamin B-12 1,745 (H) 200 - 1,100 pg/mL   Folate 13.1 ng/mL    Comment:                            Reference Range                            Low:           <3.4                            Borderline:     3.4-5.4                            Normal:        >5.4 .   POCT urinalysis dipstick     Status: Abnormal   Collection Time: 12/20/23  4:42 PM  Result Value Ref Range  Color, UA Yellow    Clarity, UA Cloudy    Glucose, UA Negative Negative   Bilirubin, UA Negative    Ketones, UA Negative    Spec Grav, UA 1.015 1.010 - 1.025   Blood, UA Large    pH, UA 6.0 5.0 - 8.0   Protein, UA Negative Negative   Urobilinogen, UA 0.2 0.2 or 1.0 E.U./dL   Nitrite, UA Negative    Leukocytes, UA Moderate (2+) (A) Negative   Appearance yellow    Odor foul      Assessment & Plan Left lower abdominal pain, possible diverticulitis Intermittent left lower abdominal pain for two weeks, possibly diverticulitis. No fever, chills, or back pain. Appetite decreased, possibly due to Mounjaro . - Order CBC and comprehensive metabolic panel. - Perform urinalysis. - Advise liquid diet, avoiding red-colored liquids. - Consider CT scan if white count elevated. - Consider Augmentin if diverticulitis confirmed. - Manage pain and consider muscle-related causes if tests negative.  Constipation and irritable bowel syndrome Chronic constipation with recent bowel movements not alleviating pain. No blood or mucus in stools. Normal bowel sounds. - Manage constipation as per usual regimen.

## 2023-12-20 NOTE — Telephone Encounter (Signed)
 FYI Only or Action Required?: FYI only for provider.  Patient was last seen in primary care on 11/10/2023 by Deborah Mire, MD. Called Nurse Triage reporting Pain. Symptoms began a week ago. Interventions attempted: OTC medications: advil . Symptoms are: gradually worsening.  Triage Disposition: See PCP Within 2 Weeks  Patient/caregiver understands and will follow disposition?: Yes     Copied from CRM [id number]. Topic: Clinical - Red Word Triage >> Dec 20, 2023  8:40 AM Marissa P wrote: Red Word that prompted transfer to Nurse Triage: About 2 weeks or so patient has been hurting a lot on left side abdomin, lots of pain and not sure what is causing it. Reason for Disposition  Abdominal pain is a chronic symptom (recurrent or ongoing AND present > 4 weeks)  Answer Assessment - Initial Assessment Questions 1. LOCATION: Where does it hurt?      Left side abd pain 2. RADIATION: Does the pain shoot anywhere else? (e.g., chest, back)     no 3. ONSET: When did the pain begin? (e.g., minutes, hours or days ago)      X 2 weeks 4. SUDDEN: Gradual or sudden onset?     gradual 5. PATTERN Does the pain come and go, or is it constant?    - If it comes and goes: How long does it last? Do you have pain now?     (Note: Comes and goes means the pain is intermittent. It goes away completely between bouts.)    - If constant: Is it getting better, staying the same, or getting worse?      (Note: Constant means the pain never goes away completely; most serious pain is constant and gets worse.)      Comes and goes 6. SEVERITY: How bad is the pain?  (e.g., Scale 1-10; mild, moderate, or severe)    - MILD (1-3): Doesn't interfere with normal activities, abdomen soft and not tender to touch.     - MODERATE (4-7): Interferes with normal activities or awakens from sleep, abdomen tender to touch.     - SEVERE (8-10): Excruciating pain, doubled over, unable to do any normal activities.        7.  RECURRENT SYMPTOM: Have you ever had this type of stomach pain before? If Yes, ask: When was the last time? and What happened that time?      Yes  8. CAUSE: What do you think is causing the stomach pain?     unknown 9. RELIEVING/AGGRAVATING FACTORS: What makes it better or worse? (e.g., antacids, bending or twisting motion, bowel movement)     ni 10. OTHER SYMPTOMS: Do you have any other symptoms? (e.g., back pain, diarrhea, fever, urination pain, vomiting)       no 11. PREGNANCY: Is there any chance you are pregnant? When was your last menstrual period?       no  Out of meloxicam   Protocols used: Abdominal Pain - [facility]

## 2023-12-21 ENCOUNTER — Other Ambulatory Visit: Payer: Self-pay | Admitting: Family Medicine

## 2023-12-21 ENCOUNTER — Ambulatory Visit: Payer: Self-pay | Admitting: Family Medicine

## 2023-12-21 DIAGNOSIS — R3129 Other microscopic hematuria: Secondary | ICD-10-CM

## 2023-12-21 DIAGNOSIS — R109 Unspecified abdominal pain: Secondary | ICD-10-CM

## 2023-12-21 LAB — URINE CULTURE
MICRO NUMBER:: 16666498
Result:: NO GROWTH
SPECIMEN QUALITY:: ADEQUATE

## 2023-12-21 MED ORDER — CIPROFLOXACIN HCL 250 MG PO TABS: 250.0000 mg | ORAL_TABLET | Freq: Two times a day (BID) | ORAL | 0 refills | Status: AC

## 2023-12-22 ENCOUNTER — Other Ambulatory Visit: Payer: Self-pay | Admitting: Family Medicine

## 2023-12-22 DIAGNOSIS — I10 Essential (primary) hypertension: Secondary | ICD-10-CM

## 2023-12-27 ENCOUNTER — Ambulatory Visit
Admission: RE | Admit: 2023-12-27 | Discharge: 2023-12-27 | Disposition: A | Discharge status: Home / Self Care | Source: Ambulatory Visit | Attending: Family Medicine | Admitting: Family Medicine

## 2023-12-27 DIAGNOSIS — R109 Unspecified abdominal pain: Secondary | ICD-10-CM | POA: Diagnosis not present

## 2023-12-27 DIAGNOSIS — N200 Calculus of kidney: Secondary | ICD-10-CM | POA: Diagnosis not present

## 2023-12-29 ENCOUNTER — Ambulatory Visit: Payer: Self-pay | Admitting: Family Medicine

## 2023-12-30 ENCOUNTER — Other Ambulatory Visit (HOSPITAL_COMMUNITY): Payer: Self-pay

## 2024-01-09 ENCOUNTER — Encounter: Payer: Self-pay | Admitting: Family Medicine

## 2024-02-07 ENCOUNTER — Other Ambulatory Visit: Payer: Self-pay | Admitting: Family Medicine

## 2024-02-07 ENCOUNTER — Ambulatory Visit: Payer: BC Managed Care – PPO

## 2024-02-07 DIAGNOSIS — M797 Fibromyalgia: Secondary | ICD-10-CM

## 2024-02-07 DIAGNOSIS — I10 Essential (primary) hypertension: Secondary | ICD-10-CM

## 2024-02-07 DIAGNOSIS — F39 Unspecified mood [affective] disorder: Secondary | ICD-10-CM

## 2024-02-11 ENCOUNTER — Encounter: Payer: Self-pay | Admitting: Family Medicine

## 2024-02-11 ENCOUNTER — Ambulatory Visit: Admitting: Family Medicine

## 2024-02-11 VITALS — BP 118/76 | HR 83 | Resp 16 | Ht 63.5 in | Wt 197.2 lb

## 2024-02-11 DIAGNOSIS — E785 Hyperlipidemia, unspecified: Secondary | ICD-10-CM

## 2024-02-11 DIAGNOSIS — M797 Fibromyalgia: Secondary | ICD-10-CM

## 2024-02-11 DIAGNOSIS — G4733 Obstructive sleep apnea (adult) (pediatric): Secondary | ICD-10-CM

## 2024-02-11 DIAGNOSIS — E1159 Type 2 diabetes mellitus with other circulatory complications: Secondary | ICD-10-CM | POA: Diagnosis not present

## 2024-02-11 DIAGNOSIS — E538 Deficiency of other specified B group vitamins: Secondary | ICD-10-CM

## 2024-02-11 DIAGNOSIS — G629 Polyneuropathy, unspecified: Secondary | ICD-10-CM

## 2024-02-11 DIAGNOSIS — E1169 Type 2 diabetes mellitus with other specified complication: Secondary | ICD-10-CM

## 2024-02-11 DIAGNOSIS — I152 Hypertension secondary to endocrine disorders: Secondary | ICD-10-CM

## 2024-02-11 DIAGNOSIS — F39 Unspecified mood [affective] disorder: Secondary | ICD-10-CM

## 2024-02-11 DIAGNOSIS — K582 Mixed irritable bowel syndrome: Secondary | ICD-10-CM

## 2024-02-11 LAB — POCT GLYCOSYLATED HEMOGLOBIN (HGB A1C): Hemoglobin A1C: 5.4 % (ref 4.0–5.6)

## 2024-02-11 MED ORDER — TIRZEPATIDE 5 MG/0.5ML ~~LOC~~ SOAJ: 5.0000 mg | SUBCUTANEOUS | 0 refills | Status: DC

## 2024-02-11 MED ORDER — PREGABALIN 50 MG PO CAPS: 50.0000 mg | ORAL_CAPSULE | Freq: Every day | ORAL | 1 refills | Status: DC

## 2024-02-11 MED ORDER — DULOXETINE HCL 30 MG PO CPEP: 90.0000 mg | ORAL_CAPSULE | Freq: Every day | ORAL | 1 refills | Status: DC

## 2024-02-11 MED ORDER — ATENOLOL 25 MG PO TABS: 25.0000 mg | ORAL_TABLET | Freq: Every day | ORAL | 1 refills | Status: DC

## 2024-02-11 MED ORDER — MODAFINIL 100 MG PO TABS: 100.0000 mg | ORAL_TABLET | Freq: Every day | ORAL | 0 refills | Status: DC

## 2024-02-11 MED ORDER — CARIPRAZINE HCL 1.5 MG PO CAPS: 1.5000 mg | ORAL_CAPSULE | Freq: Every day | ORAL | 1 refills | Status: DC

## 2024-02-11 NOTE — Progress Notes (Signed)
 Name: Deborah Navarro   MRN: [id number]    DOB: Jun 30, 1980   Date:02/11/2024       Progress Note  Subjective  Chief Complaint  Chief Complaint  Patient presents with   Medical Management of Chronic Issues   Discussed the use of AI scribe software for clinical note transcription with the patient, who gave verbal consent to proceed.  History of Present Illness Deborah Navarro is a 43 year old female who presents for a three-month follow-up.  She experienced left-sided abdominal pain lasting two weeks, which prompted a CT scan revealing tiny nonobstructive renal stones bilaterally. The pain has resolved, and no diverticulitis or other significant findings were noted.  Her A1c is 5.4. She is taking Mounjaro  5 mg and is satisfied with the dose. She has lost weight since July. She drinks a significant amount of water daily and experiences frequent thirst and sweating. No excessive hunger or frequent urination.  She takes atenolol  25 mg daily for hypertension. She has discontinued amlodipine  5 mg., she states she didn't have refills but feeling well without medication. No dizziness, lightheadedness, or palpitations.   Dyslipidemia associated with DM type 2, last LDL cholesterol level is 175 mg/dL. She is taking rosuvastatin  5 mg daily since , we will recheck labs next visit   She manages fibromyalgia with duloxetine  and Lyrica . She recently changed jobs to a less physically demanding role and is doing well, she would like to go down on lyrica  dose to 50 mg at bedtime   Her mood disorder is managed with duloxetine  30 mg and Vraylar  1.5 mg. She is doing well emotionally, especially after a recent job promotion. Her PHQ-9 score has been zero since starting her current medication regimen.  She uses a CPAP machine for sleep apnea and reports compliance with its use every night.  She experiences occasional constipation and diarrhea, which have been stable except for a recent episode of  abdominal pain.  She reports two falls in the last month, one resulting in a scrape on her knee. She attributes the falls to tripping over obstacles and not due to dizziness or imbalance.  She recently changed jobs and now works as a Therapist, music, which involves less physical labor. She works Monday through Friday and has weekends off, which she appreciates.    Patient Active Problem List   Diagnosis Date Noted   Primary hypertension 07/26/2023   Well woman exam 05/31/2023   OSA on CPAP 03/25/2023   Vitamin D  deficiency 07/01/2022   Neuropathic pain 07/01/2022   Fibromyalgia syndrome 07/01/2022   Pre-diabetes 07/01/2022   Dyslipidemia 07/01/2022   Snoring 04/21/2022   Morbid obesity (HCC) 10/01/2021   Cervical disc disease 03/07/2020   Irritable bowel syndrome (IBS) 09/26/2019   Mood disorder (HCC) 08/05/2018   Papanicolaou smear of cervix with positive high risk human papilloma virus (HPV) test 05/04/2018   False positive ana 04/28/2018   Pain in both feet 04/28/2018   Facial rash 04/07/2018   Chronic fatigue 04/07/2018   Chronic neck pain 03/27/2016   Myalgia 03/27/2016   Left-sided headache 03/27/2016   Family history of early CAD 03/15/2015   History of colon polyps 06/30/2010   B12 deficiency 06/30/2010   History of anemia 06/30/2010   Depression, major, in remission (HCC) 06/30/2010   Endometriosis 06/30/2010   CARDIAC MURMUR 06/30/2010   History of chicken pox 06/30/2010    Past Surgical History:  Procedure Laterality Date   COLONOSCOPY WITH PROPOFOL  N/A 05/16/2018  Procedure: COLONOSCOPY WITH PROPOFOL ;  Surgeon: Jinny Carmine, MD;  Location: [facility];  Service: Endoscopy;  Laterality: N/A;   COLONOSCOPY WITH PROPOFOL  N/A 05/20/2023   Procedure: COLONOSCOPY WITH PROPOFOL ;  Surgeon: Jinny Carmine, MD;  Location: [facility];  Service: Endoscopy;  Laterality: N/A;   CORONARY ARTERY BYPASS GRAFT  12/1994   Open heart surgery , asd repair   DILATION AND  CURETTAGE OF UTERUS     LAPAROSCOPIC ENDOMETRIOSIS FULGURATION  10/1999   endometriosis   NECK SURGERY N/A 06/14/2015   due to C5/6 C6-7 disc herniation    open heart surgery  12/1994   asd repair   SPINE SURGERY  05/2015   Neck surgery    Family History  Problem Relation Age of Onset   Cancer Mother 20       non hodgkins lymphoma(mets to brain)   Miscarriages / [place] Mother    Coronary artery disease Father    Hypertension Father    Hyperlipidemia Father    Diabetes Father    Skin cancer Father    Cancer Father    Kidney disease Father    Obesity Father    Diabetes Maternal Grandmother    Heart attack Maternal Grandfather    Other Maternal Grandfather        Deterioration of Jaw   Stroke Paternal Grandmother    Stroke Paternal Grandfather    Healthy Son    Obesity Son     Social History   Tobacco Use   Smoking status: Never   Smokeless tobacco: Never  Substance Use Topics   Alcohol use: Not Currently     Current Outpatient Medications:    amLODipine  (NORVASC ) 5 MG tablet, Take 1 tablet (5 mg total) by mouth daily., Disp: 90 tablet, Rfl: 0   atenolol  (TENORMIN ) 25 MG tablet, Take 1 tablet (25 mg total) by mouth daily., Disp: 90 tablet, Rfl: 0   cariprazine  (VRAYLAR ) 1.5 MG capsule, Take 1 capsule (1.5 mg total) by mouth daily., Disp: 90 capsule, Rfl: 0   Cyanocobalamin  (B-12) 1000 MCG SUBL, Place 1 tablet under the tongue daily., Disp: 30 tablet, Rfl: 0   DULoxetine  (CYMBALTA ) 30 MG capsule, Take 3 capsules (90 mg total) by mouth daily. After the 30 mg dose, Disp: 270 capsule, Rfl: 0   meloxicam  (MOBIC ) 15 MG tablet, Take 1 tablet (15 mg total) by mouth daily as needed for pain., Disp: 90 tablet, Rfl: 0   modafinil  (PROVIGIL ) 100 MG tablet, Take 1 tablet (100 mg total) by mouth daily., Disp: 90 tablet, Rfl: 0   polyethylene glycol powder (GLYCOLAX /MIRALAX ) 17 GM/SCOOP powder, Take 17 g by mouth daily as needed., Disp: 510 g, Rfl: 0   pregabalin  (LYRICA ) 75  MG capsule, Take 1 capsule (75 mg total) by mouth at bedtime., Disp: 90 capsule, Rfl: 0   rosuvastatin  (CRESTOR ) 5 MG tablet, Take 1 tablet (5 mg total) by mouth daily., Disp: 90 tablet, Rfl: 1   tirzepatide  (MOUNJARO ) 5 MG/0.5ML Pen, Inject 5 mg into the skin once a week., Disp: 6 mL, Rfl: 0   Vitamin D , Ergocalciferol , (DRISDOL ) 1.25 MG (50000 UNIT) CAPS capsule, Take 1 capsule (50,000 Units total) by mouth every 7 (seven) days., Disp: 12 capsule, Rfl: 1  No Known Allergies  I personally reviewed active problem list, medication list, allergies with the patient/caregiver today.   ROS  Ten systems reviewed and is negative except as mentioned in HPI    Objective Physical Exam  CONSTITUTIONAL: Patient appears well-developed and  well-nourished.  No distress. HEENT: Head atraumatic, normocephalic, neck supple. CARDIOVASCULAR: Normal rate, regular rhythm and normal heart sounds.  No murmur heard. No BLE edema. PULMONARY: Effort normal and breath sounds normal. No respiratory distress. MUSCULOSKELETAL: Normal gait. Without gross motor or sensory deficit. PSYCHIATRIC: Patient has a normal mood and affect. behavior is normal. Judgment and thought content normal.  Vitals:   02/11/24 1448  BP: 118/76  Pulse: 83  Resp: 16  SpO2: 99%  Weight: 197 lb 3.2 oz (89.4 kg)  Height: 5' 3.5 (1.613 m)    Body mass index is 34.38 kg/m.  Recent Results (from the past 2160 hours)  CBC with Differential/Platelet     Status: None   Collection Time: 12/20/23  4:01 PM  Result Value Ref Range   WBC 6.1 3.8 - 10.8 Thousand/uL   RBC 4.15 3.80 - 5.10 Million/uL   Hemoglobin 12.5 11.7 - 15.5 g/dL   HCT 62.5 64.9 - 54.9 %   MCV 90.1 80.0 - 100.0 fL   MCH 30.1 27.0 - 33.0 pg   MCHC 33.4 32.0 - 36.0 g/dL    Comment: For adults, a slight decrease in the calculated MCHC value (in the range of 30 to 32 g/dL) is most likely not clinically significant; however, it should be interpreted with caution in  correlation with other red cell parameters and the patient's clinical condition.    RDW 12.3 11.0 - 15.0 %   Platelets 297 140 - 400 Thousand/uL   MPV 10.5 7.5 - 12.5 fL   Neutro Abs 2,861 1,500 - 7,800 cells/uL   Absolute Lymphocytes 2,684 850 - 3,900 cells/uL   Absolute Monocytes 464 200 - 950 cells/uL   Eosinophils Absolute 49 15 - 500 cells/uL   Basophils Absolute 43 0 - 200 cells/uL   Neutrophils Relative % 46.9 %   Total Lymphocyte 44.0 %   Monocytes Relative 7.6 %   Eosinophils Relative 0.8 %   Basophils Relative 0.7 %  Comprehensive metabolic panel with GFR     Status: None   Collection Time: 12/20/23  4:01 PM  Result Value Ref Range   Glucose, Bld 84 65 - 99 mg/dL    Comment: .            Fasting reference interval .    BUN 12 7 - 25 mg/dL   Creat 9.03 9.49 - 9.00 mg/dL   eGFR 75 > OR = 60 fO/fpw/8.26f7   BUN/Creatinine Ratio SEE NOTE: 6 - 22 (calc)    Comment:    Not Reported: BUN and Creatinine are within    reference range. .    Sodium 138 135 - 146 mmol/L   Potassium 4.3 3.5 - 5.3 mmol/L   Chloride 102 98 - 110 mmol/L   CO2 29 20 - 32 mmol/L   Calcium  9.1 8.6 - 10.2 mg/dL   Total Protein 7.0 6.1 - 8.1 g/dL   Albumin 4.4 3.6 - 5.1 g/dL   Globulin 2.6 1.9 - 3.7 g/dL (calc)   AG Ratio 1.7 1.0 - 2.5 (calc)   Total Bilirubin 0.3 0.2 - 1.2 mg/dL   Alkaline phosphatase (APISO) 75 31 - 125 U/L   AST 15 10 - 30 U/L   ALT 20 6 - 29 U/L  Lipase     Status: None   Collection Time: 12/20/23  4:01 PM  Result Value Ref Range   Lipase 27 7 - 60 U/L  Urine Culture     Status: None   Collection  Time: 12/20/23  4:05 PM   Specimen: Urine  Result Value Ref Range   MICRO NUMBER: [id number]    SPECIMEN QUALITY: Adequate    Sample Source URINE    STATUS: FINAL    Result: No Growth   POCT urinalysis dipstick     Status: Abnormal   Collection Time: 12/20/23  4:42 PM  Result Value Ref Range   Color, UA Yellow    Clarity, UA Cloudy    Glucose, UA Negative Negative    Bilirubin, UA Negative    Ketones, UA Negative    Spec Grav, UA 1.015 1.010 - 1.025   Blood, UA Large    pH, UA 6.0 5.0 - 8.0   Protein, UA Negative Negative   Urobilinogen, UA 0.2 0.2 or 1.0 E.U./dL   Nitrite, UA Negative    Leukocytes, UA Moderate (2+) (A) Negative   Appearance yellow    Odor foul   POCT glycosylated hemoglobin (Hb A1C)     Status: None   Collection Time: 02/11/24  2:53 PM  Result Value Ref Range   Hemoglobin A1C 5.4 4.0 - 5.6 %   HbA1c POC (<> result, manual entry)     HbA1c, POC (prediabetic range)     HbA1c, POC (controlled diabetic range)      Diabetic Foot Exam:     PHQ2/9:    02/11/2024    2:42 PM 12/20/2023    3:21 PM 11/10/2023    3:00 PM 08/10/2023    9:16 AM 07/26/2023   10:55 AM  Depression screen PHQ 2/9  Decreased Interest 0 0 0 0 0  Down, Depressed, Hopeless 0 0 0 0 0  PHQ - 2 Score 0 0 0 0 0  Altered sleeping 0 0 0 0   Tired, decreased energy 0 0 0 0   Change in appetite 0 0 0 0   Feeling bad or failure about yourself  0 0 0 0   Trouble concentrating 0 0 0 0   Moving slowly or fidgety/restless 0 0 0 0   Suicidal thoughts 0 0 0 0   PHQ-9 Score 0 0 0 0   Difficult doing work/chores Not difficult at all Not difficult at all Not difficult at all Not difficult at all     phq 9 is negative  Fall Risk:    02/11/2024    2:42 PM 12/20/2023    3:21 PM 11/10/2023    2:55 PM 07/26/2023   10:47 AM 03/29/2023    8:16 AM  Fall Risk   Falls in the past year? 0 0 0 0 0  Number falls in past yr: 0 0 0 0 0  Injury with Fall? 0 0 0 0 0  Risk for fall due to : No Fall Risks No Fall Risks No Fall Risks  No Fall Risks  Follow up Falls evaluation completed Falls evaluation completed Falls prevention discussed;Education provided;Falls evaluation completed  Falls prevention discussed;Education provided;Falls evaluation completed     Assessment and Plan Assessment & Plan Type 2 diabetes mellitus with associated dyslipidemia and essential  hypertension Diabetes well-controlled with A1c 5.4. Dyslipidemia with elevated LDL 175 mg/dL. Hypertension well-controlled. Weight loss beneficial for diabetes management. - Continue Mounjaro  5 mg. - Continue rosuvastatin  5 mg. - Discontinue amlodipine  since bp is at goal while off medication for weeks - Continue atenolol  25 mg. - Encourage dietary modifications and portion control. - Reassess cholesterol levels next year.  Obstructive sleep apnea Compliant with CPAP therapy, using nightly. Reports  morning difficulty but managing well. - Continue CPAP therapy.  Fibromyalgia Symptoms managed with duloxetine  and Lyrica . Job change to less physically demanding role may aid symptom management. Interested in reducing Lyrica  for weight management. - Reduce Lyrica  to 50 mg. - Continue duloxetine .  Mixed irritable bowel syndrome IBS symptoms stable. Recent left-sided abdominal pain resolved without intervention.  Mood disorder Well-controlled with duloxetine  and Vraylar . Reports emotional well-being, PHQ-9 score zero. - Continue duloxetine . - Continue Vraylar  1.5 mg.  History of falls Reported two falls, one with brief loss of consciousness. Likely due to tripping over obstacles. - Advise to seek medical evaluation if future falls occur, especially with head injury or loss of consciousness.

## 2024-03-06 ENCOUNTER — Encounter: Payer: Self-pay | Admitting: Family Medicine

## 2024-03-20 ENCOUNTER — Other Ambulatory Visit: Payer: Self-pay | Admitting: Family Medicine

## 2024-03-20 DIAGNOSIS — M79671 Pain in right foot: Secondary | ICD-10-CM

## 2024-03-20 DIAGNOSIS — M797 Fibromyalgia: Secondary | ICD-10-CM

## 2024-03-31 ENCOUNTER — Ambulatory Visit (INDEPENDENT_AMBULATORY_CARE_PROVIDER_SITE_OTHER): Admitting: Family Medicine

## 2024-03-31 ENCOUNTER — Other Ambulatory Visit: Payer: Self-pay | Admitting: Family Medicine

## 2024-03-31 ENCOUNTER — Encounter: Payer: Self-pay | Admitting: Family Medicine

## 2024-03-31 VITALS — BP 130/70 | HR 75 | Temp 98.7°F | Resp 16 | Wt 194.0 lb

## 2024-03-31 DIAGNOSIS — H6501 Acute serous otitis media, right ear: Secondary | ICD-10-CM | POA: Diagnosis not present

## 2024-03-31 MED ORDER — FLUTICASONE PROPIONATE 50 MCG/ACT NA SUSP: 2.0000 | Freq: Every day | NASAL | 1 refills | Status: DC

## 2024-03-31 NOTE — Telephone Encounter (Signed)
Already approved

## 2024-03-31 NOTE — Progress Notes (Signed)
 Established patient visit   Patient: Deborah Navarro   DOB: 1981/03/28   43 y.o. Female  MRN: [id number] Visit Date: 03/31/2024  Today's healthcare provider: Nancyann Perry, MD   Chief Complaint  Patient presents with   Otalgia   Subjective    Otalgia  Pertinent negatives include no abdominal pain or vomiting.    Patient presents with about 5 days bilateral ear pain worse on right associated with sinus and nasal congestion. No fevers, chills, sweats, dyspnea, sore throat or cough.   Medications: Outpatient Medications Prior to Visit  Medication Sig   atenolol  (TENORMIN ) 25 MG tablet Take 1 tablet (25 mg total) by mouth daily.   cariprazine  (VRAYLAR ) 1.5 MG capsule Take 1 capsule (1.5 mg total) by mouth daily.   Cyanocobalamin  (B-12) 1000 MCG SUBL Place 1 tablet under the tongue daily.   DULoxetine  (CYMBALTA ) 30 MG capsule Take 3 capsules (90 mg total) by mouth daily. After the 30 mg dose   meloxicam  (MOBIC ) 15 MG tablet TAKE 1 TABLET(15 MG) BY MOUTH DAILY AS NEEDED FOR PAIN   modafinil  (PROVIGIL ) 100 MG tablet Take 1 tablet (100 mg total) by mouth daily.   polyethylene glycol powder (GLYCOLAX /MIRALAX ) 17 GM/SCOOP powder Take 17 g by mouth daily as needed.   pregabalin  (LYRICA ) 50 MG capsule Take 1 capsule (50 mg total) by mouth at bedtime.   rosuvastatin  (CRESTOR ) 5 MG tablet Take 1 tablet (5 mg total) by mouth daily.   tirzepatide  (MOUNJARO ) 5 MG/0.5ML Pen Inject 5 mg into the skin once a week.   Vitamin D , Ergocalciferol , (DRISDOL ) 1.25 MG (50000 UNIT) CAPS capsule Take 1 capsule (50,000 Units total) by mouth every 7 (seven) days.   No facility-administered medications prior to visit.    Review of Systems  Constitutional:  Negative for appetite change, chills, fatigue and fever.  HENT:  Positive for ear pain.   Respiratory:  Negative for chest tightness and shortness of breath.   Cardiovascular:  Negative for chest pain and palpitations.  Gastrointestinal:   Negative for abdominal pain, nausea and vomiting.  Neurological:  Negative for dizziness and weakness.       Objective    BP 130/70 (BP Location: Left Arm, Patient Position: Sitting, Cuff Size: Normal)   Pulse 75   Temp 98.7 F (37.1 C)   Resp 16   Wt 194 lb (88 kg)   SpO2 100%   BMI 33.83 kg/m    Physical Exam   General Appearance:    Obese female, alert, cooperative, in no acute distress  HENT:   ENT exam normal, no neck nodes or sinus tenderness, neck without nodes, sinuses nontender, post nasal drip noted, and nasal mucosa pale and congested. Normal ear canals bilaterally. Both Tms dull with fluid level, more so on right.   Eyes:    PERRL, conjunctiva/corneas clear, EOM's intact       Lungs:     Clear to auscultation bilaterally, respirations unlabored  Heart:    Normal heart rate. Normal rhythm.  2/6 systolic murmur   Neurologic:   Awake, alert, oriented x 3. No apparent focal neurological           defect.         Assessment & Plan     1. Non-recurrent acute serous otitis media of right ear (Primary)  - fluticasone (FLONASE) 50 MCG/ACT nasal spray; Place 2 sprays into both nostrils daily.  Dispense: 16 g; Refill: 1   She is concerned  because she has to leave town on Tuesday the 21st. Advised to call back if sx worsen or are not much better over the weekend, as she may need an antibiotic in that case.         Nancyann Perry, MD  [facility] [phone] (phone) [phone] (fax)  [facility]

## 2024-03-31 NOTE — Patient Instructions (Signed)
 SABRA  Please review the attached list of medications and notify my office if there are any errors.   . Please bring all of your medications to every appointment so we can make sure that our medication list is the same as yours.

## 2024-04-24 ENCOUNTER — Other Ambulatory Visit: Payer: Self-pay | Admitting: Family Medicine

## 2024-04-24 DIAGNOSIS — Z1231 Encounter for screening mammogram for malignant neoplasm of breast: Secondary | ICD-10-CM

## 2024-05-10 ENCOUNTER — Other Ambulatory Visit: Payer: Self-pay | Admitting: Family Medicine

## 2024-05-10 DIAGNOSIS — E1169 Type 2 diabetes mellitus with other specified complication: Secondary | ICD-10-CM

## 2024-05-12 NOTE — Telephone Encounter (Signed)
 Requested Prescriptions  Pending Prescriptions Disp Refills   rosuvastatin  (CRESTOR ) 5 MG tablet [Pharmacy Med Name: ROSUVASTATIN  5MG  TABLETS] 90 tablet 0    Sig: TAKE 1 TABLET(5 MG) BY MOUTH DAILY     Cardiovascular:  Antilipid - Statins 2 Failed - 05/12/2024  4:30 PM      Failed - Lipid Panel in normal range within the last 12 months    Cholesterol  Date Value Ref Range Status  08/10/2023 269 (H) <200 mg/dL Final   LDL Cholesterol (Calc)  Date Value Ref Range Status  08/10/2023 173 (H) mg/dL (calc) Final    Comment:    Reference range: <100 . Desirable range <100 mg/dL for primary prevention;   <70 mg/dL for patients with CHD or diabetic patients  with > or = 2 CHD risk factors. SABRA LDL-C is now calculated using the Martin-Hopkins  calculation, which is a validated novel method providing  better accuracy than the Friedewald equation in the  estimation of LDL-C.  Gladis APPLETHWAITE et al. SANDREA. 7986;689(80): 2061-2068  (http://education.[facility].com/faq/FAQ164)    HDL  Date Value Ref Range Status  08/10/2023 54 > OR = 50 mg/dL Final   Triglycerides  Date Value Ref Range Status  08/10/2023 245 (H) <150 mg/dL Final    Comment:    . If a non-fasting specimen was collected, consider repeat triglyceride testing on a fasting specimen if clinically indicated.  Veatrice et al. J. of Clin. Lipidol. 2015;9:129-169. SABRA          Passed - Cr in normal range and within 360 days    Creat  Date Value Ref Range Status  12/20/2023 0.96 0.50 - 0.99 mg/dL Final   Creatinine, Urine  Date Value Ref Range Status  08/10/2023 92 20 - 275 mg/dL Final         Passed - Patient is not pregnant      Passed - Valid encounter within last 12 months    Recent Outpatient Visits           1 month ago Non-recurrent acute serous otitis media of right ear   [place] [facility] [place] Nancyann BRAVO, MD   3 months ago Dyslipidemia due to type 2 diabetes mellitus [facility]   [facility] Glenard Mire, MD   4 months ago Acute left flank pain   [place] [facility] [place], Mire, MD   6 months ago Dyslipidemia due to type 2 diabetes mellitus [facility])   [facility] Glenard Mire, MD   9 months ago Dyslipidemia due to type 2 diabetes mellitus [facility])   [facility] Sowles, Krichna, MD       Future Appointments             In 5 days Sowles, Krichna, MD [facility] [place]

## 2024-05-15 ENCOUNTER — Other Ambulatory Visit: Payer: Self-pay | Admitting: Family Medicine

## 2024-05-15 DIAGNOSIS — E559 Vitamin D deficiency, unspecified: Secondary | ICD-10-CM

## 2024-05-16 ENCOUNTER — Other Ambulatory Visit: Payer: Self-pay | Admitting: Family Medicine

## 2024-05-16 DIAGNOSIS — F39 Unspecified mood [affective] disorder: Secondary | ICD-10-CM

## 2024-05-17 ENCOUNTER — Ambulatory Visit: Admitting: Family Medicine

## 2024-05-18 NOTE — Telephone Encounter (Signed)
 Requested medication (s) are due for refill today - unsure  Requested medication (s) are on the active medication list -yes  Future visit scheduled -yes  Last refill: 11/10/23 #12 1RF  Notes to clinic: high dose medication- requires provider review   Requested Prescriptions  Pending Prescriptions Disp Refills   Vitamin D , Ergocalciferol , (DRISDOL ) 1.25 MG (50000 UNIT) CAPS capsule [Pharmacy Med Name: VITAMIN D2 50,000IU (ERGO) CAP RX] 12 capsule 1    Sig: TAKE 1 CAPSULE BY MOUTH EVERY 7 DAYS     Endocrinology:  Vitamins - Vitamin D  Supplementation 2 Failed - 05/18/2024 10:35 AM      Failed - Manual Review: Route requests for 50,000 IU strength to the provider      Failed - Vitamin D  in normal range and within 360 days    VITD  Date Value Ref Range Status  12/23/2017 16.99 (L) 30.00 - 100.00 ng/mL Final   Vit D, 25-Hydroxy  Date Value Ref Range Status  03/29/2023 57 30 - 100 ng/mL Final    Comment:    Vitamin D  Status         25-OH Vitamin D : . Deficiency:                    <20 ng/mL Insufficiency:             20 - 29 ng/mL Optimal:                 > or = 30 ng/mL . For 25-OH Vitamin D  testing on patients on  D2-supplementation and patients for whom quantitation  of D2 and D3 fractions is required, the QuestAssureD(TM) 25-OH VIT D, (D2,D3), LC/MS/MS is recommended: order  code 07111 (patients >34yrs). . See Note 1 . Note 1 . For additional information, please refer to  http://education.QuestDiagnostics.com/faq/FAQ199  (This link is being provided for informational/ educational purposes only.)          Passed - Ca in normal range and within 360 days    Calcium   Date Value Ref Range Status  12/20/2023 9.1 8.6 - 10.2 mg/dL Final         Passed - Valid encounter within last 12 months    Recent Outpatient Visits           1 month ago Non-recurrent acute serous otitis media of right ear   [facility] Gas[place] Nancyann BRAVO, MD   3 months ago  Dyslipidemia due to type 2 diabetes mellitus [facility])   [place] [facility] [place], [place], MD   5 months ago Acute left flank pain   [place] [facility] [place], Dorette, MD   6 months ago Dyslipidemia due to type 2 diabetes mellitus [facility])   [place] [facility] [place], Dorette, MD   9 months ago Dyslipidemia due to type 2 diabetes mellitus [facility])   [place] [facility] [place] Dorette, MD                 Requested Prescriptions  Pending Prescriptions Disp Refills   Vitamin D , Ergocalciferol , (DRISDOL ) 1.25 MG (50000 UNIT) CAPS capsule [Pharmacy Med Name: VITAMIN D2 50,000IU (ERGO) CAP RX] 12 capsule 1    Sig: TAKE 1 CAPSULE BY MOUTH EVERY 7 DAYS     Endocrinology:  Vitamins - Vitamin D  Supplementation 2 Failed - 05/18/2024 10:35 AM      Failed - Manual Review: Route requests for 50,000 IU strength to the provider  Failed - Vitamin D  in normal range and within 360 days    VITD  Date Value Ref Range Status  12/23/2017 16.99 (L) 30.00 - 100.00 ng/mL Final   Vit D, 25-Hydroxy  Date Value Ref Range Status  03/29/2023 57 30 - 100 ng/mL Final    Comment:    Vitamin D  Status         25-OH Vitamin D : . Deficiency:                    <20 ng/mL Insufficiency:             20 - 29 ng/mL Optimal:                 > or = 30 ng/mL . For 25-OH Vitamin D  testing on patients on  D2-supplementation and patients for whom quantitation  of D2 and D3 fractions is required, the QuestAssureD(TM) 25-OH VIT D, (D2,D3), LC/MS/MS is recommended: order  code 07111 (patients >35yrs). . See Note 1 . Note 1 . For additional information, please refer to  http://education.Quest[facility].com/faq/FAQ199  (This link is being provided for informational/ educational purposes only.)          Passed - Ca in normal range and within 360 days    Calcium   Date Value Ref Range Status  12/20/2023 9.1 8.6 - 10.2 mg/dL Final          Passed - Valid encounter within last 12 months    Recent Outpatient Visits           1 month ago Non-recurrent acute serous otitis media of right ear   [place] Gas[place] Nancyann BRAVO, MD   3 months ago Dyslipidemia due to type 2 diabetes mellitus [facility])   [place] [facility] [place] Mire, MD   5 months ago Acute left flank pain   [place] [place], Mire, MD   6 months ago Dyslipidemia due to type 2 diabetes mellitus [facility])   [place] [facility] Glen[place] Mire, MD   9 months ago Dyslipidemia due to type 2 diabetes mellitus [facility])   [facility] [place], Krichna, MD

## 2024-05-19 NOTE — Telephone Encounter (Signed)
 Requested medication (s) are due for refill today: na   Requested medication (s) are on the active medication list: yes   Last refill:  02/11/24 #90 0 refills  Future visit scheduled: yes 06/13/24  Notes to clinic:  medication not assigned to a protocol. Do you want to refill Rx?     Requested Prescriptions  Pending Prescriptions Disp Refills   modafinil  (PROVIGIL ) 100 MG tablet [Pharmacy Med Name: MODAFINIL  100MG  TABLETS] 90 tablet     Sig: TAKE 1 TABLET(100 MG) BY MOUTH DAILY     Off-Protocol Failed - 05/19/2024  1:23 PM      Failed - Medication not assigned to a protocol, review manually.      Passed - Valid encounter within last 12 months    Recent Outpatient Visits           1 month ago Non-recurrent acute serous otitis media of right ear   [place] [facility] Gas[facility] Nancyann BRAVO, MD   3 months ago Dyslipidemia due to type 2 diabetes mellitus [facility])   [place] [facility] [place] Mire, MD   5 months ago Acute left flank pain   [place] [facility] Glen[facility] Mire, MD   6 months ago Dyslipidemia due to type 2 diabetes mellitus [facility])   [place] [facility] [place] Mire, MD   9 months ago Dyslipidemia due to type 2 diabetes mellitus [facility])   [facility], Krichna, MD

## 2024-05-23 ENCOUNTER — Encounter: Payer: Self-pay | Admitting: Family Medicine

## 2024-05-23 ENCOUNTER — Other Ambulatory Visit: Payer: Self-pay | Admitting: Family Medicine

## 2024-05-23 DIAGNOSIS — F39 Unspecified mood [affective] disorder: Secondary | ICD-10-CM

## 2024-05-25 ENCOUNTER — Other Ambulatory Visit: Payer: Self-pay | Admitting: Family Medicine

## 2024-05-25 DIAGNOSIS — F39 Unspecified mood [affective] disorder: Secondary | ICD-10-CM

## 2024-05-25 NOTE — Telephone Encounter (Signed)
 Requested medication (s) are due for refill today: yes  Requested medication (s) are on the active medication list: yes  Last refill:  05/19/24  Future visit scheduled: {Yes  Notes to clinic:  Medication not assigned to a protocol, review manually.      Requested Prescriptions  Pending Prescriptions Disp Refills   modafinil  (PROVIGIL ) 100 MG tablet [Pharmacy Med Name: MODAFINIL  100MG  TABLETS] 90 tablet     Sig: TAKE 1 TABLET(100 MG) BY MOUTH DAILY     Off-Protocol Failed - 05/25/2024 12:05 PM      Failed - Medication not assigned to a protocol, review manually.      Passed - Valid encounter within last 12 months    Recent Outpatient Visits           1 month ago Non-recurrent acute serous otitis media of right ear   [place] [facility] Gas[place] Nancyann BRAVO, MD   3 months ago Dyslipidemia due to type 2 diabetes mellitus [facility])   [place] [facility] Glen[place] Mire, MD   5 months ago Acute left flank pain   [place] Mire, MD   6 months ago Dyslipidemia due to type 2 diabetes mellitus [facility])   [place] [facility] Glenard Mire, MD   9 months ago Dyslipidemia due to type 2 diabetes mellitus [facility])   [facility] [place], Krichna, MD

## 2024-05-26 NOTE — Telephone Encounter (Signed)
 Too soon for refill.  Requested Prescriptions  Pending Prescriptions Disp Refills   modafinil  (PROVIGIL ) 100 MG tablet [Pharmacy Med Name: MODAFINIL  100MG  TABLETS] 90 tablet     Sig: TAKE 1 TABLET(100 MG) BY MOUTH DAILY     Off-Protocol Failed - 05/26/2024  4:14 PM      Failed - Medication not assigned to a protocol, review manually.      Passed - Valid encounter within last 12 months    Recent Outpatient Visits           1 month ago Non-recurrent acute serous otitis media of right ear   [place] [facility] Gas[facility] Nancyann BRAVO, MD   3 months ago Dyslipidemia due to type 2 diabetes mellitus [facility])   [place] [facility] Glen[facility] Mire, MD   5 months ago Acute left flank pain   [place] [facility] Mire, MD   6 months ago Dyslipidemia due to type 2 diabetes mellitus [facility])   [place] [facility] Glen[facility] Mire, MD   9 months ago Dyslipidemia due to type 2 diabetes mellitus [facility])   [facility] Sow[place], Krichna, MD

## 2024-05-31 ENCOUNTER — Other Ambulatory Visit: Payer: Self-pay | Admitting: Family Medicine

## 2024-05-31 ENCOUNTER — Ambulatory Visit: Admission: RE | Admit: 2024-05-31 | Discharge: 2024-05-31 | Attending: Family Medicine | Admitting: Family Medicine

## 2024-05-31 DIAGNOSIS — Z1231 Encounter for screening mammogram for malignant neoplasm of breast: Secondary | ICD-10-CM | POA: Diagnosis not present

## 2024-06-11 ENCOUNTER — Emergency Department

## 2024-06-11 ENCOUNTER — Other Ambulatory Visit: Payer: Self-pay

## 2024-06-11 ENCOUNTER — Emergency Department
Admission: EM | Admit: 2024-06-11 | Discharge: 2024-06-11 | Disposition: A | Discharge status: Home / Self Care | Attending: Emergency Medicine | Admitting: Emergency Medicine

## 2024-06-11 DIAGNOSIS — R1031 Right lower quadrant pain: Secondary | ICD-10-CM | POA: Diagnosis present

## 2024-06-11 DIAGNOSIS — R1011 Right upper quadrant pain: Secondary | ICD-10-CM

## 2024-06-11 LAB — URINALYSIS, ROUTINE W REFLEX MICROSCOPIC
Bacteria, UA: NONE SEEN
Bilirubin Urine: NEGATIVE
Glucose, UA: NEGATIVE mg/dL
Ketones, ur: NEGATIVE mg/dL
Nitrite: NEGATIVE
Protein, ur: NEGATIVE mg/dL
Specific Gravity, Urine: 1.003 — ABNORMAL LOW (ref 1.005–1.030)
pH: 6 (ref 5.0–8.0)

## 2024-06-11 LAB — CBC
HCT: 38.2 % (ref 36.0–46.0)
Hemoglobin: 12.4 g/dL (ref 12.0–15.0)
MCH: 29.3 pg (ref 26.0–34.0)
MCHC: 32.5 g/dL (ref 30.0–36.0)
MCV: 90.3 fL (ref 80.0–100.0)
Platelets: 328 K/uL (ref 150–400)
RBC: 4.23 MIL/uL (ref 3.87–5.11)
RDW: 13 % (ref 11.5–15.5)
WBC: 7.5 K/uL (ref 4.0–10.5)
nRBC: 0 % (ref 0.0–0.2)

## 2024-06-11 LAB — LIPASE, BLOOD: Lipase: 40 U/L (ref 11–51)

## 2024-06-11 LAB — COMPREHENSIVE METABOLIC PANEL WITH GFR
ALT: 32 U/L (ref 0–44)
AST: 26 U/L (ref 15–41)
Albumin: 4.4 g/dL (ref 3.5–5.0)
Alkaline Phosphatase: 83 U/L (ref 38–126)
Anion gap: 10 (ref 5–15)
BUN: 7 mg/dL (ref 6–20)
CO2: 28 mmol/L (ref 22–32)
Calcium: 9.2 mg/dL (ref 8.9–10.3)
Chloride: 103 mmol/L (ref 98–111)
Creatinine, Ser: 0.86 mg/dL (ref 0.44–1.00)
GFR, Estimated: >60 mL/min
Glucose, Bld: 92 mg/dL (ref 70–99)
Potassium: 4.4 mmol/L (ref 3.5–5.1)
Sodium: 140 mmol/L (ref 135–145)
Total Bilirubin: 0.3 mg/dL (ref 0.0–1.2)
Total Protein: 7.5 g/dL (ref 6.5–8.1)

## 2024-06-11 MED ORDER — ONDANSETRON 8 MG PO TBDP
8.0000 mg | ORAL_TABLET | Freq: Once | ORAL | Status: AC
  Administered 2024-06-11: 8 mg via ORAL
  Filled 2024-06-11: qty 1

## 2024-06-11 MED ORDER — PANTOPRAZOLE SODIUM 40 MG PO TBEC: 40.0000 mg | DELAYED_RELEASE_TABLET | Freq: Every day | ORAL | 1 refills | Status: AC

## 2024-06-11 MED ORDER — DICYCLOMINE HCL 10 MG PO CAPS: 10.0000 mg | ORAL_CAPSULE | Freq: Three times a day (TID) | ORAL | 0 refills | Status: AC

## 2024-06-11 MED ORDER — PANTOPRAZOLE SODIUM 40 MG PO TBEC
40.0000 mg | DELAYED_RELEASE_TABLET | Freq: Once | ORAL | Status: AC
  Administered 2024-06-11: 40 mg via ORAL
  Filled 2024-06-11: qty 1

## 2024-06-11 MED ORDER — DICYCLOMINE HCL 10 MG PO CAPS
10.0000 mg | ORAL_CAPSULE | Freq: Once | ORAL | Status: AC
  Administered 2024-06-11: 10 mg via ORAL
  Filled 2024-06-11: qty 1

## 2024-06-11 NOTE — ED Provider Notes (Signed)
 "  [facility] Provider Note   Event Date/Time   First MD Initiated Contact with Patient 06/11/24 1346     (approximate) History  Abdominal Pain  HPI Deborah Navarro is a 43 y.o. female with a stated past medical history of irritable bowel syndrome and endometriosis who presents complaining of right upper quadrant abdominal pain.  Patient states that this pain has been intermittent however is worsened with food when she ate wings today.  Patient states that this pain is similar to pain that she has had in the past and states she was worked up extensively for but was never diagnosed.  Patient states she was told she has IBS. ROS: Patient currently denies any vision changes, tinnitus, difficulty speaking, facial droop, sore throat, chest pain, shortness of breath, nausea/vomiting/diarrhea, dysuria, or weakness/numbness/paresthesias in any extremity   Physical Exam  Triage Vital Signs: ED Triage Vitals  Encounter Vitals Group     BP 06/11/24 1256 (!) 156/100     Girls Systolic BP Percentile --      Girls Diastolic BP Percentile --      Boys Systolic BP Percentile --      Boys Diastolic BP Percentile --      Pulse Rate 06/11/24 1256 82     Resp 06/11/24 1256 18     Temp 06/11/24 1256 (!) 97.4 F (36.3 C)     Temp src --      SpO2 06/11/24 1256 100 %     Weight 06/11/24 1254 200 lb (90.7 kg)     Height 06/11/24 1254 5' 3.5 (1.613 m)     Head Circumference --      Peak Flow --      Pain Score 06/11/24 1254 8     Pain Loc --      Pain Education --      Exclude from Growth Chart --    Most recent vital signs: Vitals:   06/11/24 1256 06/11/24 1701  BP: (!) 156/100 132/73  Pulse: 82 74  Resp: 18 18  Temp: (!) 97.4 F (36.3 C) 98 F (36.7 C)  SpO2: 100% 100%   General: Awake, oriented x4. CV:  Good peripheral perfusion. Resp:  Normal effort. Abd:  No distention.  Mild right upper quadrant tenderness to palpation Other:  Middle-aged obese Caucasian  female resting comfortably in no acute distress ED Results / Procedures / Treatments  Labs (all labs ordered are listed, but only abnormal results are displayed) Labs Reviewed  URINALYSIS, ROUTINE W REFLEX MICROSCOPIC - Abnormal; Notable for the following components:      Result Value   Color, Urine STRAW (*)    APPearance CLEAR (*)    Specific Gravity, Urine 1.003 (*)    Hgb urine dipstick SMALL (*)    Leukocytes,Ua SMALL (*)    All other components within normal limits  LIPASE, BLOOD  COMPREHENSIVE METABOLIC PANEL WITH GFR  CBC  POC URINE PREG, ED   RADIOLOGY ED MD interpretation: Right upper quadrant ultrasound shows no sonographic evidence of acute cholecystitis with borderline prominent CBD at 8 mm - All radiology independently interpreted and agree with radiology assessment Official radiology report(s): US  ABDOMEN LIMITED RUQ (LIVER/GB) Result Date: 06/11/2024 EXAM: Right Upper Quadrant Abdominal Ultrasound 06/11/2024 03:09:23 PM TECHNIQUE: Real-time ultrasonography of the right upper quadrant of the abdomen was performed. COMPARISON: US  Abdomen 10/04/2017; ct a/p 12/27/2023 CLINICAL HISTORY: RUQ pain. FINDINGS: LIVER: Normal echogenicity. No intrahepatic biliary ductal dilatation. No evidence of mass.  Hepatopetal flow in the portal vein. BILIARY SYSTEM: Gallbladder wall thickness measures 2.8 mm. No pericholecystic fluid. No cholelithiasis. The common bile duct measures 8.1 mm. OTHER: No right upper quadrant ascites. IMPRESSION: 1. No sonographic evidence of acute cholecystitis. 2. Borderline prominent common bile duct measuring 8 mm; consider further evaluation (e.g., MRCP) if there is concern for biliary obstruction. Electronically signed by: Morgane Naveau MD 06/11/2024 04:43 PM EST RP Workstation: [id number]   PROCEDURES: Critical Care performed: No Procedures MEDICATIONS ORDERED IN ED: Medications  ondansetron  (ZOFRAN -ODT) disintegrating tablet 8 mg (8 mg Oral Given 06/11/24  1700)   IMPRESSION / MDM / ASSESSMENT AND PLAN / ED COURSE  I reviewed the triage vital signs and the nursing notes.                             The patient is on the cardiac monitor to evaluate for evidence of arrhythmia and/or significant heart rate changes. Patient's presentation is most consistent with acute presentation with potential threat to life or bodily function. Patient is a 43 year old female with the above-stated past medical history who presents after an episode of right upper quadrant abdominal pain after eating wings. DDx: Biliary disease, reflux, IBS, appendicitis Plan: CBC, CMP, UA, lipase, right upper quadrant ultrasound  Based on physical exam, laboratory evaluation, and radiologic valuation, patient shows no signs of red flag symptomatology at this time.  Patient is p.o. tolerant and has no pain.  Patient states she did not have any pain after p.o. during her emergency department course.  Given no obvious evidence of cholelithiasis or cholecystitis, will treat empirically with Bentyl  and Protonix  as well as instructions to follow-up with her primary care physician if symptoms do not improve.  Patient agrees with plan for discharge at this time with outpatient follow-up.  Patient given strict return precautions and all questions answered prior to discharge  Dispo: Discharge home with PCP follow-up   FINAL CLINICAL IMPRESSION(S) / ED DIAGNOSES   Final diagnoses:  Right upper quadrant abdominal pain   Rx / DC Orders   ED Discharge Orders          Ordered    dicyclomine  (BENTYL ) 10 MG capsule  3 times daily before meals & bedtime        06/11/24 1718    pantoprazole  (PROTONIX ) 40 MG tablet  Daily        06/11/24 1718           Note:  This document was prepared using Dragon voice recognition software and may include unintentional dictation errors.   Arryanna Holquin K, MD 06/11/24 1721  "

## 2024-06-11 NOTE — ED Triage Notes (Signed)
 Pt comes with RUQ pain and some diarrhea for last two days. Pt states worse today while she was eating. Pt was eating wings when this started.

## 2024-06-13 ENCOUNTER — Telehealth: Payer: Self-pay | Admitting: Pharmacy Technician

## 2024-06-13 ENCOUNTER — Other Ambulatory Visit (HOSPITAL_COMMUNITY): Payer: Self-pay

## 2024-06-13 ENCOUNTER — Ambulatory Visit (INDEPENDENT_AMBULATORY_CARE_PROVIDER_SITE_OTHER): Admitting: Family Medicine

## 2024-06-13 ENCOUNTER — Encounter: Payer: Self-pay | Admitting: Family Medicine

## 2024-06-13 VITALS — BP 118/74 | HR 88 | Resp 16 | Ht 63.5 in | Wt 204.0 lb

## 2024-06-13 DIAGNOSIS — E1159 Type 2 diabetes mellitus with other circulatory complications: Secondary | ICD-10-CM

## 2024-06-13 DIAGNOSIS — E1169 Type 2 diabetes mellitus with other specified complication: Secondary | ICD-10-CM

## 2024-06-13 DIAGNOSIS — E785 Hyperlipidemia, unspecified: Secondary | ICD-10-CM | POA: Diagnosis not present

## 2024-06-13 DIAGNOSIS — F39 Unspecified mood [affective] disorder: Secondary | ICD-10-CM | POA: Diagnosis not present

## 2024-06-13 DIAGNOSIS — E559 Vitamin D deficiency, unspecified: Secondary | ICD-10-CM

## 2024-06-13 DIAGNOSIS — G629 Polyneuropathy, unspecified: Secondary | ICD-10-CM | POA: Diagnosis not present

## 2024-06-13 DIAGNOSIS — E66812 Obesity, class 2: Secondary | ICD-10-CM

## 2024-06-13 DIAGNOSIS — M797 Fibromyalgia: Secondary | ICD-10-CM | POA: Diagnosis not present

## 2024-06-13 DIAGNOSIS — I152 Hypertension secondary to endocrine disorders: Secondary | ICD-10-CM | POA: Diagnosis not present

## 2024-06-13 DIAGNOSIS — E538 Deficiency of other specified B group vitamins: Secondary | ICD-10-CM

## 2024-06-13 DIAGNOSIS — G4733 Obstructive sleep apnea (adult) (pediatric): Secondary | ICD-10-CM | POA: Diagnosis not present

## 2024-06-13 DIAGNOSIS — Z23 Encounter for immunization: Secondary | ICD-10-CM

## 2024-06-13 DIAGNOSIS — R109 Unspecified abdominal pain: Secondary | ICD-10-CM | POA: Diagnosis not present

## 2024-06-13 LAB — POCT GLYCOSYLATED HEMOGLOBIN (HGB A1C): Hemoglobin A1C: 5.5 % (ref 4.0–5.6)

## 2024-06-13 MED ORDER — ATENOLOL 25 MG PO TABS: 25.0000 mg | ORAL_TABLET | Freq: Every day | ORAL | 1 refills | Status: AC

## 2024-06-13 MED ORDER — TIRZEPATIDE 5 MG/0.5ML ~~LOC~~ SOAJ: 5.0000 mg | SUBCUTANEOUS | 0 refills | Status: AC

## 2024-06-13 MED ORDER — CARIPRAZINE HCL 1.5 MG PO CAPS: 1.5000 mg | ORAL_CAPSULE | Freq: Every day | ORAL | 1 refills | Status: AC

## 2024-06-13 MED ORDER — ROSUVASTATIN CALCIUM 5 MG PO TABS: 5.0000 mg | ORAL_TABLET | Freq: Every day | ORAL | 1 refills | Status: AC

## 2024-06-13 MED ORDER — DULOXETINE HCL 30 MG PO CPEP: 90.0000 mg | ORAL_CAPSULE | Freq: Every day | ORAL | 1 refills | Status: AC

## 2024-06-13 MED ORDER — MODAFINIL 100 MG PO TABS: 100.0000 mg | ORAL_TABLET | Freq: Every day | ORAL | 0 refills | Status: AC

## 2024-06-13 NOTE — Progress Notes (Signed)
 Name: Deborah Navarro   MRN: [id number]    DOB: 1981-03-08   Date:06/13/2024       Progress Note  Subjective  Chief Complaint  Chief Complaint  Patient presents with   Medical Management of Chronic Issues   Discussed the use of AI scribe software for clinical note transcription with the patient, who gave verbal consent to proceed.  History of Present Illness Deborah Navarro is a 43 year old female with IBS and endometriosis who presents for a regular follow-up and evaluation of recurrent abdominal pain.  She recently visited the emergency room on December 28th due to severe right upper quadrant pain that began after eating wings. The pain was intermittent, and she experienced vomiting while at the hospital. Her blood pressure was elevated upon arrival but improved without fever. A urine test showed a little blood and pus, and imaging revealed a slightly enlarged common bile duct. Currently, the pain has improved but she still experiences centralized peri-umbilical pain.  She has a history of IBS and endometriosis, with recurrent abdominal pain since 2011. Previous episodes of pain have been diffuse, but the current pain is more localized. She has had a CT scan in the past, which did not show diverticulitis. The pain on December 28th was intense, similar to labor pains, and lasted almost three hours. She has been taking Dramamine for nausea.  She is diagnosed with type 2 diabetes and has been taking Mounjaro  5 mg, which has helped with weight control. However, she has been off the medication for two weeks due to pharmacy issues and has gained ten pounds since October. She also takes rosuvastatin  for dyslipidemia and atenolol  25 mg for hypertension.  She has a history of neuropathy, primarily in her feet, and was taking pregabalin  but has stopped due to weight gain concerns. She uses a CPAP machine nightly, except when congested, and takes B12 supplements over the counter. She also has a  history of fibromyalgia, with pain currently manageable at a 3-4 out of 10 without meloxicam , which she has not taken in two weeks.  No current fever or vomiting. She reports nausea and has taken Dramamine for relief. She has a history of constipation but denies current issues.    Patient Active Problem List   Diagnosis Date Noted   Primary hypertension 07/26/2023   OSA on CPAP 03/25/2023   Vitamin D  deficiency 07/01/2022   Neuropathic pain 07/01/2022   Fibromyalgia syndrome 07/01/2022   Dyslipidemia 07/01/2022   Cervical disc disease 03/07/2020   Irritable bowel syndrome (IBS) 09/26/2019   Mood disorder 08/05/2018   Papanicolaou smear of cervix with positive high risk human papilloma virus (HPV) test 05/04/2018   False positive ana 04/28/2018   Chronic fatigue 04/07/2018   Left-sided headache 03/27/2016   Family history of early CAD 03/15/2015   History of colon polyps 06/30/2010   B12 deficiency 06/30/2010   History of anemia 06/30/2010   Depression, major, in remission 06/30/2010   Endometriosis 06/30/2010   CARDIAC MURMUR 06/30/2010   History of chicken pox 06/30/2010    Past Surgical History:  Procedure Laterality Date   COLONOSCOPY WITH PROPOFOL  N/A 05/16/2018   Procedure: COLONOSCOPY WITH PROPOFOL ;  Surgeon: Jinny Carmine, MD;  Location: [facility];  Service: Endoscopy;  Laterality: N/A;   COLONOSCOPY WITH PROPOFOL  N/A 05/20/2023   Procedure: COLONOSCOPY WITH PROPOFOL ;  Surgeon: Jinny Carmine, MD;  Location: [facility];  Service: Endoscopy;  Laterality: N/A;   CORONARY ARTERY BYPASS GRAFT  12/1994  Open heart surgery , asd repair   DILATION AND CURETTAGE OF UTERUS     LAPAROSCOPIC ENDOMETRIOSIS FULGURATION  10/1999   endometriosis   NECK SURGERY N/A 06/14/2015   due to C5/6 C6-7 disc herniation    open heart surgery  12/1994   asd repair   SPINE SURGERY  05/2015   Neck surgery    Family History  Problem Relation Age of Onset   Cancer Mother 36        non hodgkins lymphoma(mets to brain)   Miscarriages / Stillbirths Mother    Coronary artery disease Father    Hypertension Father    Hyperlipidemia Father    Diabetes Father    Skin cancer Father    Cancer Father    Kidney disease Father    Obesity Father    Diabetes Maternal Grandmother    Heart attack Maternal Grandfather    Other Maternal Grandfather        Deterioration of Jaw   Stroke Paternal Grandmother    Stroke Paternal Grandfather    Healthy Son    Obesity Son     Social History   Tobacco Use   Smoking status: Never   Smokeless tobacco: Never  Substance Use Topics   Alcohol use: Not Currently    Current Medications[1]  Allergies[2]  I personally reviewed active problem list, medication list, allergies, family history with the patient/caregiver today.   ROS  Ten systems reviewed and is negative except as mentioned in HPI    Objective Physical Exam CONSTITUTIONAL: Patient appears well-developed and well-nourished.  No distress. HEENT: Head atraumatic, normocephalic, neck supple. CARDIOVASCULAR: Normal rate, regular rhythm and normal heart sounds.  No murmur heard. No BLE edema. PULMONARY: Effort normal and breath sounds normal. No respiratory distress. ABDOMINAL: There is no tenderness or distention. Mild discomfort on peri umbilical area , no guarding or rebound tenderness  MUSCULOSKELETAL: Normal gait. Without gross motor or sensory deficit. PSYCHIATRIC: Patient has a normal mood and affect. behavior is normal. Judgment and thought content normal.  Vitals:   06/13/24 1426  BP: 118/74  Pulse: 88  Resp: 16  SpO2: 99%  Weight: 204 lb (92.5 kg)  Height: 5' 3.5 (1.613 m)    Body mass index is 35.57 kg/m.  Recent Results (from the past 2160 hours)  Lipase, blood     Status: None   Collection Time: 06/11/24 12:56 PM  Result Value Ref Range   Lipase 40 11 - 51 U/L    Comment: Performed at [facility], [address]  [place], [place] [address]  Comprehensive metabolic panel     Status: None   Collection Time: 06/11/24 12:56 PM  Result Value Ref Range   Sodium 140 135 - 145 mmol/L   Potassium 4.4 3.5 - 5.1 mmol/L   Chloride 103 98 - 111 mmol/L   CO2 28 22 - 32 mmol/L   Glucose, Bld 92 70 - 99 mg/dL    Comment: Glucose reference range applies only to samples taken after fasting for at least 8 hours.   BUN 7 6 - 20 mg/dL   Creatinine, Ser 9.13 0.44 - 1.00 mg/dL   Calcium  9.2 8.9 - 10.3 mg/dL   Total Protein 7.5 6.5 - 8.1 g/dL   Albumin 4.4 3.5 - 5.0 g/dL   AST 26 15 - 41 U/L   ALT 32 0 - 44 U/L   Alkaline Phosphatase 83 38 - 126 U/L   Total Bilirubin 0.3 0.0 - 1.2 mg/dL  GFR, Estimated >60 >60 mL/min    Comment: (NOTE) Calculated using the CKD-EPI Creatinine Equation (2021)    Anion gap 10 5 - 15    Comment: Performed at [facility], [address] [place], [place] [address]  CBC     Status: None   Collection Time: 06/11/24 12:56 PM  Result Value Ref Range   WBC 7.5 4.0 - 10.5 K/uL   RBC 4.23 3.87 - 5.11 MIL/uL   Hemoglobin 12.4 12.0 - 15.0 g/dL   HCT 61.7 63.9 - 53.9 %   MCV 90.3 80.0 - 100.0 fL   MCH 29.3 26.0 - 34.0 pg   MCHC 32.5 30.0 - 36.0 g/dL   RDW 86.9 88.4 - 84.4 %   Platelets 328 150 - 400 K/uL   nRBC 0.0 0.0 - 0.2 %    Comment: Performed at [facility], [address] [place], [place] [address]  Urinalysis, Routine w reflex microscopic -Urine, Random     Status: Abnormal   Collection Time: 06/11/24 12:56 PM  Result Value Ref Range   Color, Urine STRAW (A) YELLOW   APPearance CLEAR (A) CLEAR   Specific Gravity, Urine 1.003 (L) 1.005 - 1.030   pH 6.0 5.0 - 8.0   Glucose, UA NEGATIVE NEGATIVE mg/dL   Hgb urine dipstick SMALL (A) NEGATIVE   Bilirubin Urine NEGATIVE NEGATIVE   Ketones, ur NEGATIVE NEGATIVE mg/dL   Protein, ur NEGATIVE NEGATIVE mg/dL   Nitrite NEGATIVE NEGATIVE   Leukocytes,Ua SMALL (A) NEGATIVE   RBC / HPF 0-5 0 - 5 RBC/hpf   WBC, UA  0-5 0 - 5 WBC/hpf   Bacteria, UA NONE SEEN NONE SEEN   Squamous Epithelial / HPF 6-10 0 - 5 /HPF    Comment: Performed at [facility], [address] [place], [place] [address]  POCT glycosylated hemoglobin (Hb A1C)     Status: None   Collection Time: 06/13/24  2:35 PM  Result Value Ref Range   Hemoglobin A1C 5.5 4.0 - 5.6 %   HbA1c POC (<> result, manual entry)     HbA1c, POC (prediabetic range)     HbA1c, POC (controlled diabetic range)      Diabetic Foot Exam:     PHQ2/9:    06/13/2024    2:24 PM 02/11/2024    2:42 PM 12/20/2023    3:21 PM 11/10/2023    3:00 PM 08/10/2023    9:16 AM  Depression screen PHQ 2/9  Decreased Interest 0 0 0 0 0  Down, Depressed, Hopeless 0 0 0 0 0  PHQ - 2 Score 0 0 0 0 0  Altered sleeping  0 0 0 0  Tired, decreased energy  0 0 0 0  Change in appetite  0 0 0 0  Feeling bad or failure about yourself   0 0 0 0  Trouble concentrating  0 0 0 0  Moving slowly or fidgety/restless  0 0 0 0  Suicidal thoughts  0 0 0 0  PHQ-9 Score  0  0  0  0   Difficult doing work/chores  Not difficult at all Not difficult at all Not difficult at all Not difficult at all     Data saved with a previous flowsheet row definition    phq 9 is negative  Fall Risk:    06/13/2024    2:24 PM 02/11/2024    2:42 PM 12/20/2023    3:21 PM 11/10/2023    2:55 PM 07/26/2023  10:47 AM  Fall Risk   Falls in the past year? 0 0 0 0 0  Number falls in past yr: 0 0 0 0 0  Injury with Fall? 0 0  0  0  0   Risk for fall due to : No Fall Risks No Fall Risks No Fall Risks No Fall Risks   Follow up Falls evaluation completed Falls evaluation completed Falls evaluation completed Falls prevention discussed;Education provided;Falls evaluation completed      Data saved with a previous flowsheet row definition      Assessment & Plan Recurrent abdominal pain due to irritable bowel syndrome and endometriosis Recurrent abdominal pain with differential including IBS and  endometriosis. Recent ER visit showed elevated blood pressure, hematuria, and pyuria. No acute cholecystitis. Common bile duct slightly dilated, MRCP suggested for biliary obstruction concern. - Referred to GI specialist for further evaluation. - Advised to monitor for fever, vomiting, or lack of improvement and report if these occur. - Instructed to pick up pantoprazole  from pharmacy.  Type 2 diabetes mellitus with hypertension and dyslipidemia Type 2 diabetes managed with Mounjaro  5 mg. Hypertension managed with atenolol  25 mg. Dyslipidemia managed with rosuvastatin . Transitioning prescriptions to [facility] due to availability issues. - Sent prescriptions for atenolol , rosuvastatin , and Mounjaro  to [facility]. - Advised to resume Mounjaro  5 mg after two weeks off medication.  Morbid obesity Weight gain noted after discontinuation of Mounjaro  for two weeks. - Resume Mounjaro  5 mg to aid in weight management.  Mood disorder Managed with Vraylar  and duloxetine . Modafinil  used for alertness and focus, but currently out of medication.  Fibromyalgia Pain well-managed, rated 3-4 out of 10. Meloxicam  not taken regularly due to kidney concerns.  Peripheral neuropathy Primarily affecting the left leg and feet. Discontinued pregabalin  due to weight gain concerns.  Obstructive sleep apnea Managed with CPAP, used nightly except for two nights due to congestion. - Continue CPAP use nightly.  Vitamin D  deficiency  Vitamin B12 deficiency - Continue over-the-counter B12 supplementation.  General health maintenance Flu vaccination due. Previous flu shot administered in August. - Administered flu shot today.        [1]  Current Outpatient Medications:    atenolol  (TENORMIN ) 25 MG tablet, Take 1 tablet (25 mg total) by mouth daily., Disp: 90 tablet, Rfl: 1   cariprazine  (VRAYLAR ) 1.5 MG capsule, Take 1 capsule (1.5 mg total) by mouth daily., Disp: 90 capsule, Rfl: 1   Cyanocobalamin  (B-12)  1000 MCG SUBL, Place 1 tablet under the tongue daily., Disp: 30 tablet, Rfl: 0   dicyclomine  (BENTYL ) 10 MG capsule, Take 1 capsule (10 mg total) by mouth 4 (four) times daily -  before meals and at bedtime., Disp: 120 capsule, Rfl: 0   DULoxetine  (CYMBALTA ) 30 MG capsule, Take 3 capsules (90 mg total) by mouth daily. After the 30 mg dose, Disp: 270 capsule, Rfl: 1   fluticasone  (FLONASE ) 50 MCG/ACT nasal spray, SHAKE LIQUID AND USE 2 SPRAYS IN EACH NOSTRIL DAILY, Disp: 48 g, Rfl: 0   meloxicam  (MOBIC ) 15 MG tablet, TAKE 1 TABLET(15 MG) BY MOUTH DAILY AS NEEDED FOR PAIN, Disp: 90 tablet, Rfl: 0   modafinil  (PROVIGIL ) 100 MG tablet, TAKE 1 TABLET(100 MG) BY MOUTH DAILY, Disp: 30 tablet, Rfl: 0   pantoprazole  (PROTONIX ) 40 MG tablet, Take 1 tablet (40 mg total) by mouth daily., Disp: 30 tablet, Rfl: 1   polyethylene glycol powder (GLYCOLAX /MIRALAX ) 17 GM/SCOOP powder, Take 17 g by mouth daily as needed., Disp: 510  g, Rfl: 0   pregabalin  (LYRICA ) 50 MG capsule, Take 1 capsule (50 mg total) by mouth at bedtime., Disp: 90 capsule, Rfl: 1   rosuvastatin  (CRESTOR ) 5 MG tablet, TAKE 1 TABLET(5 MG) BY MOUTH DAILY, Disp: 90 tablet, Rfl: 0   tirzepatide  (MOUNJARO ) 5 MG/0.5ML Pen, Inject 5 mg into the skin once a week., Disp: 6 mL, Rfl: 0   Vitamin D , Ergocalciferol , (DRISDOL ) 1.25 MG (50000 UNIT) CAPS capsule, Take 1 capsule (50,000 Units total) by mouth every 7 (seven) days., Disp: 12 capsule, Rfl: 1 [2] No Known Allergies

## 2024-06-13 NOTE — Telephone Encounter (Signed)
 Pharmacy Patient Advocate Encounter   Received notification from CoverMyMeds that prior authorization for Modafinil  100MG  tablets  is required/requested.   Insurance verification completed.   The patient is insured through [facility].   Per test claim: PA required; PA started via CoverMyMeds. KEY [id number] . Waiting for clinical questions to populate.

## 2024-06-14 ENCOUNTER — Other Ambulatory Visit (HOSPITAL_COMMUNITY): Payer: Self-pay

## 2024-06-14 NOTE — Telephone Encounter (Signed)
 Pharmacy Patient Advocate Encounter  Received notification from [employer] that Prior Authorization for Modafinil  100MG  tablets has been APPROVED from 06/13/24 to 12/12/24. Ran test claim, Copay is $25.00 for 3 months. This test claim was processed through [facility]- copay amounts may vary at other pharmacies due to pharmacy/plan contracts, or as the patient moves through the different stages of their insurance plan.   PA #/Case ID/Reference #: [id number]

## 2024-07-04 ENCOUNTER — Ambulatory Visit: Admitting: Obstetrics & Gynecology

## 2024-07-04 ENCOUNTER — Encounter: Payer: Self-pay | Admitting: Obstetrics & Gynecology

## 2024-07-04 VITALS — BP 123/78 | HR 73 | Ht 63.5 in | Wt 201.0 lb

## 2024-07-04 DIAGNOSIS — Z8742 Personal history of other diseases of the female genital tract: Secondary | ICD-10-CM | POA: Diagnosis not present

## 2024-07-04 DIAGNOSIS — R102 Pelvic and perineal pain unspecified side: Secondary | ICD-10-CM | POA: Diagnosis not present

## 2024-07-04 MED ORDER — NORETHINDRONE ACETATE 5 MG PO TABS: ORAL_TABLET | ORAL | 5 refills | Status: AC

## 2024-07-04 NOTE — Progress Notes (Signed)
" ° ° °  GYNECOLOGY PROGRESS NOTE  Subjective:    Patient ID: Deborah Navarro, female    DOB: [birth year], 44 y.o.   MRN: [id number]  HPI  Patient is a 44 y.o. married [id number] (56 yo son) here as a new patient to discuss pelvic pain. She was diagnosed with endometriosis in 2001 via laparoscopy with Dr. Roni. She reports that the pain got better after surgery. It then returned in 2012. She had 2 laparoscopies (gen surg and Dr. Fredirick). The pain then subsided again until the last couple of years. She has stopped having sex for the last 2 years. The pain is monthly with her cycles. She also reports ovulation pain. She takes IBU prn which does help some.  The pain is worse since she stopped lyrica  and meloxican.   She had a CT 12/2023 that showed only small bilateral kidney stones.  She took OCPs in the past last year but she didn't notice an improvement in the pain.   The following portions of the patient's history were reviewed and updated as appropriate: allergies, current medications, past family history, past medical history, past social history, past surgical history, and problem list.  Review of Systems Pertinent items are noted in HPI.  Pap 2024 and normal   Objective:   Blood pressure 123/78, pulse 73, height 5' 3.5 (1.613 m), weight 201 lb (91.2 kg), last menstrual period 05/29/2024. Body mass index is 35.05 kg/m. Well nourished, well hydrated White female, no apparent distress She is ambulating and conversing normally.   Assessment:   Chronic pelvic pain, h/o endometriosis   Plan:   Since OCPs didn't help last year, I rec trial of norethindrone  If this doesn't work then rec trial of Oralissa/Myfembree/lupron. She will come back in a couple of months if her pain is not improved. "

## 2024-07-10 ENCOUNTER — Ambulatory Visit: Admitting: Obstetrics & Gynecology

## 2024-08-08 ENCOUNTER — Ambulatory Visit: Admitting: Family Medicine

## 2024-09-11 ENCOUNTER — Ambulatory Visit: Admitting: Family Medicine
